# Patient Record
Sex: Female | Born: 1990 | Race: Black or African American | Hispanic: No | Marital: Single | Smoking: Never smoker
Health system: Southern US, Community
[De-identification: ages and names within clinical notes are randomized; demographics above are authoritative.]

## PROBLEM LIST (undated history)

## (undated) ENCOUNTER — Inpatient Hospital Stay (HOSPITAL_COMMUNITY): Payer: Self-pay

## (undated) ENCOUNTER — Ambulatory Visit: Payer: MEDICAID

## (undated) DIAGNOSIS — E785 Hyperlipidemia, unspecified: Secondary | ICD-10-CM

## (undated) DIAGNOSIS — J45909 Unspecified asthma, uncomplicated: Secondary | ICD-10-CM

## (undated) DIAGNOSIS — F209 Schizophrenia, unspecified: Secondary | ICD-10-CM

## (undated) DIAGNOSIS — Z8619 Personal history of other infectious and parasitic diseases: Secondary | ICD-10-CM

## (undated) DIAGNOSIS — D649 Anemia, unspecified: Secondary | ICD-10-CM

## (undated) DIAGNOSIS — D560 Alpha thalassemia: Secondary | ICD-10-CM

## (undated) DIAGNOSIS — Z8739 Personal history of other diseases of the musculoskeletal system and connective tissue: Secondary | ICD-10-CM

## (undated) DIAGNOSIS — O139 Gestational [pregnancy-induced] hypertension without significant proteinuria, unspecified trimester: Secondary | ICD-10-CM

## (undated) HISTORY — DX: Alpha thalassemia: D56.0

## (undated) HISTORY — PX: TONSILLECTOMY: SUR1361

## (undated) HISTORY — DX: Hyperlipidemia, unspecified: E78.5

## (undated) HISTORY — DX: Personal history of other infectious and parasitic diseases: Z86.19

## (undated) HISTORY — DX: Personal history of other diseases of the musculoskeletal system and connective tissue: Z87.39

## (undated) HISTORY — DX: Anemia, unspecified: D64.9

## (undated) HISTORY — DX: Gestational (pregnancy-induced) hypertension without significant proteinuria, unspecified trimester: O13.9

---
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
## 2015-08-14 ENCOUNTER — Emergency Department (HOSPITAL_COMMUNITY)
Admission: EM | Admit: 2015-08-14 | Discharge: 2015-08-14 | Disposition: A | Discharge status: Home / Self Care | Payer: BC Managed Care – PPO | Source: Home / Self Care | Attending: Emergency Medicine | Admitting: Emergency Medicine

## 2015-08-14 ENCOUNTER — Encounter (HOSPITAL_COMMUNITY): Payer: Self-pay | Admitting: Emergency Medicine

## 2015-08-14 DIAGNOSIS — R040 Epistaxis: Secondary | ICD-10-CM | POA: Diagnosis not present

## 2015-08-14 DIAGNOSIS — B9789 Other viral agents as the cause of diseases classified elsewhere: Principal | ICD-10-CM

## 2015-08-14 DIAGNOSIS — J069 Acute upper respiratory infection, unspecified: Secondary | ICD-10-CM

## 2015-08-14 HISTORY — DX: Unspecified asthma, uncomplicated: J45.909

## 2015-08-14 MED ORDER — ALBUTEROL SULFATE HFA 108 (90 BASE) MCG/ACT IN AERS: 2.0000 | INHALATION_SPRAY | RESPIRATORY_TRACT | Status: DC | PRN

## 2015-08-14 MED ORDER — HYDROCODONE-HOMATROPINE 5-1.5 MG/5ML PO SYRP: 5.0000 mL | ORAL_SOLUTION | Freq: Four times a day (QID) | ORAL | Status: DC | PRN

## 2015-08-14 NOTE — ED Provider Notes (Signed)
CSN: [id number]     Arrival date & time 08/14/15  1303 History   First MD Initiated Contact with Patient 08/14/15 1326     Chief Complaint  Patient presents with  . URI   (Consider location/radiation/quality/duration/timing/severity/associated sxs/prior Treatment) HPI  She is a 25 year old woman here for evaluation of cold symptoms. She states her symptoms started about 3 days ago. She reports nasal congestion, rhinorrhea, and cough. She reports some subjective fevers as well as a mild intermittent headache. She denies any shortness of breath. She does report some sternal chest pain, but only with coughing. She has had some wheezing, but this resolves with her albuterol inhaler. She denies any nausea or vomiting. She does report a decreased appetite. She has been taking over-the-counter medications with some improvement. She states it seems like her symptoms have started to improve. She denies any sore throat or body aches.  She also reports that over the last year she has had frequent nosebleeds.  She states they occur 3 times per week. They are always from the left nostril. She states they are light and stop bleeding quickly. She does not use any nasal sprays.  Past Medical History  Diagnosis Date  . Asthma    History reviewed. No pertinent past surgical history. History reviewed. No pertinent family history. Social History  Substance Use Topics  . Smoking status: Never Smoker   . Smokeless tobacco: None  . Alcohol Use: No   OB History    No data available     Review of Systems As in history of present illness Allergies  Review of patient's allergies indicates no known allergies.  Home Medications   Prior to Admission medications   Medication Sig Start Date End Date Taking? Authorizing Provider  albuterol (PROVENTIL HFA;VENTOLIN HFA) 108 (90 Base) MCG/ACT inhaler Inhale 2 puffs into the lungs every 4 (four) hours as needed for wheezing or shortness of breath. 08/14/15   Charm Rings, MD  HYDROcodone-homatropine [facility]) 5-1.5 MG/5ML syrup Take 5 mLs by mouth every 6 (six) hours as needed for cough. 08/14/15   Charm Rings, MD   Meds Ordered and Administered this Visit  Medications - No data to display  BP 115/66 mmHg  Pulse 67  Temp(Src) 98.3 F (36.8 C) (Oral)  Resp 16  SpO2 100%  LMP 07/26/2015 (Exact Date) No data found.   Physical Exam  Constitutional: She is oriented to person, place, and time. She appears well-developed and well-nourished. No distress.  HENT:  Mouth/Throat: No oropharyngeal exudate.  Mild oropharyngeal erythema with clear postnasal drainage. Nasal mucosa is erythematous and boggy. No scabbing or friability is seen.  Neck: Neck supple.  Cardiovascular: Normal rate, regular rhythm and normal heart sounds.   No murmur heard. Pulmonary/Chest: Effort normal and breath sounds normal. No respiratory distress. She has no wheezes. She has no rales. She exhibits no tenderness.  Lymphadenopathy:    She has no cervical adenopathy.  Neurological: She is alert and oriented to person, place, and time.    ED Course  Procedures (including critical care time)  Labs Review Labs Reviewed - No data to display  Imaging Review No results found.    MDM   1. Viral URI with cough   2. Bleeding nose    Discussed symptomatic treatment. Prescriptions given for albuterol and Hycodan. Discussed that I do not see anything concerning regarding the nosebleeds. RecommendedVaseline at bedtime. If this does not improve over the next month, she should see an ENT  specialist.    Charm Rings, MD 08/14/15 [phone]

## 2015-08-14 NOTE — Discharge Instructions (Signed)
You have a nasty cold virus. It sounds like you are on the down swing.  The cough can linger for several weeks. It is okay to continue over-the-counter medications. I've provided a prescription for Hycodan, a cough syrup. Fill this if the over-the-counter stuff is not working for you. Increase your fluid intake. I do not see anything obvious in your nose. I would try applying Vaseline at night. If this does not improve the nosebleeds over the next 2-4 weeks, please follow-up with an ENT specialist. Follow-up as needed.

## 2015-08-14 NOTE — ED Notes (Signed)
Pt has been suffering from nasal congestion and a cough for three days.  Pt has not taken her temperature at home, but feels she may have had a mild fever.

## 2017-10-18 ENCOUNTER — Other Ambulatory Visit: Payer: Self-pay

## 2017-10-18 ENCOUNTER — Ambulatory Visit (HOSPITAL_COMMUNITY)
Admission: RE | Admit: 2017-10-18 | Discharge: 2017-10-18 | Disposition: A | Discharge status: Home / Self Care | Payer: BC Managed Care – PPO | Attending: Psychiatry | Admitting: Psychiatry

## 2017-10-18 ENCOUNTER — Inpatient Hospital Stay (HOSPITAL_COMMUNITY)
Admission: AD | Admit: 2017-10-18 | Discharge: 2017-11-04 | DRG: 885 | Disposition: A | Discharge status: Home / Self Care | Payer: Self-pay | Source: Intra-hospital | Attending: Psychiatry | Admitting: Psychiatry

## 2017-10-18 ENCOUNTER — Encounter (HOSPITAL_COMMUNITY): Payer: Self-pay

## 2017-10-18 ENCOUNTER — Encounter (HOSPITAL_COMMUNITY): Payer: Self-pay | Admitting: Nurse Practitioner

## 2017-10-18 ENCOUNTER — Emergency Department (HOSPITAL_COMMUNITY)
Admission: EM | Admit: 2017-10-18 | Discharge: 2017-10-18 | Disposition: A | Discharge status: Other Acute Inpatient Hospital | Payer: Self-pay | Attending: Emergency Medicine | Admitting: Emergency Medicine

## 2017-10-18 DIAGNOSIS — R45851 Suicidal ideations: Secondary | ICD-10-CM | POA: Insufficient documentation

## 2017-10-18 DIAGNOSIS — R413 Other amnesia: Secondary | ICD-10-CM | POA: Insufficient documentation

## 2017-10-18 DIAGNOSIS — R451 Restlessness and agitation: Secondary | ICD-10-CM | POA: Diagnosis present

## 2017-10-18 DIAGNOSIS — F329 Major depressive disorder, single episode, unspecified: Secondary | ICD-10-CM | POA: Insufficient documentation

## 2017-10-18 DIAGNOSIS — J45909 Unspecified asthma, uncomplicated: Secondary | ICD-10-CM | POA: Insufficient documentation

## 2017-10-18 DIAGNOSIS — G4700 Insomnia, unspecified: Secondary | ICD-10-CM | POA: Insufficient documentation

## 2017-10-18 DIAGNOSIS — F209 Schizophrenia, unspecified: Principal | ICD-10-CM | POA: Diagnosis present

## 2017-10-18 DIAGNOSIS — R441 Visual hallucinations: Secondary | ICD-10-CM | POA: Insufficient documentation

## 2017-10-18 DIAGNOSIS — R410 Disorientation, unspecified: Secondary | ICD-10-CM | POA: Insufficient documentation

## 2017-10-18 LAB — COMPREHENSIVE METABOLIC PANEL
ALT: 22 U/L (ref 14–54)
AST: 22 U/L (ref 15–41)
Albumin: 4.1 g/dL (ref 3.5–5.0)
Alkaline Phosphatase: 52 U/L (ref 38–126)
Anion gap: 11 (ref 5–15)
BUN: 14 mg/dL (ref 6–20)
CO2: 20 mmol/L — ABNORMAL LOW (ref 22–32)
Calcium: 9.6 mg/dL (ref 8.9–10.3)
Chloride: 106 mmol/L (ref 101–111)
Creatinine, Ser: 1.01 mg/dL — ABNORMAL HIGH (ref 0.44–1.00)
GFR calc Af Amer: >60 mL/min (ref >60)
GFR calc non Af Amer: >60 mL/min (ref >60)
Glucose, Bld: 139 mg/dL — ABNORMAL HIGH (ref 65–99)
Potassium: 3.8 mmol/L (ref 3.5–5.1)
Sodium: 137 mmol/L (ref 135–145)
Total Bilirubin: 0.8 mg/dL (ref 0.3–1.2)
Total Protein: 7.8 g/dL (ref 6.5–8.1)

## 2017-10-18 LAB — I-STAT BETA HCG BLOOD, ED (MC, WL, AP ONLY): I-stat hCG, quantitative: <5 m[IU]/mL (ref <5)

## 2017-10-18 LAB — RAPID URINE DRUG SCREEN, HOSP PERFORMED
Amphetamines: NOT DETECTED
Barbiturates: NOT DETECTED
Benzodiazepines: NOT DETECTED
Cocaine: NOT DETECTED
Opiates: NOT DETECTED
Tetrahydrocannabinol: NOT DETECTED

## 2017-10-18 LAB — CBC WITH DIFFERENTIAL/PLATELET
Basophils Absolute: 0 10*3/uL (ref 0.0–0.1)
Basophils Relative: 0 %
Eosinophils Absolute: 0 10*3/uL (ref 0.0–0.7)
Eosinophils Relative: 0 %
HCT: 39.9 % (ref 36.0–46.0)
Hemoglobin: 12.9 g/dL (ref 12.0–15.0)
Lymphocytes Relative: 15 %
Lymphs Abs: 1.3 10*3/uL (ref 0.7–4.0)
MCH: 22.3 pg — ABNORMAL LOW (ref 26.0–34.0)
MCHC: 32.3 g/dL (ref 30.0–36.0)
MCV: 68.9 fL — ABNORMAL LOW (ref 78.0–100.0)
Monocytes Absolute: 0.6 10*3/uL (ref 0.1–1.0)
Monocytes Relative: 7 %
Neutro Abs: 6.9 10*3/uL (ref 1.7–7.7)
Neutrophils Relative %: 78 %
Platelets: 309 10*3/uL (ref 150–400)
RBC: 5.79 MIL/uL — ABNORMAL HIGH (ref 3.87–5.11)
RDW: 14.5 % (ref 11.5–15.5)
WBC: 8.8 10*3/uL (ref 4.0–10.5)

## 2017-10-18 LAB — ETHANOL: Alcohol, Ethyl (B): <10 mg/dL (ref <10)

## 2017-10-18 MED ORDER — LORAZEPAM 1 MG PO TABS
1.0000 mg | ORAL_TABLET | ORAL | Status: DC | PRN
  Filled 2017-10-18: qty 1

## 2017-10-18 MED ORDER — OLANZAPINE 5 MG PO TBDP
5.0000 mg | ORAL_TABLET | Freq: Three times a day (TID) | ORAL | Status: DC | PRN
  Filled 2017-10-18: qty 1

## 2017-10-18 MED ORDER — HYDROXYZINE HCL 25 MG PO TABS
25.0000 mg | ORAL_TABLET | Freq: Four times a day (QID) | ORAL | Status: DC | PRN
  Administered 2017-10-18: 25 mg via ORAL
  Filled 2017-10-18 (×2): qty 1

## 2017-10-18 MED ORDER — ZIPRASIDONE MESYLATE 20 MG IM SOLR
20.0000 mg | INTRAMUSCULAR | Status: DC | PRN
  Filled 2017-10-18: qty 20

## 2017-10-18 MED ORDER — MAGNESIUM HYDROXIDE 400 MG/5ML PO SUSP: 30.0000 mL | Freq: Every day | ORAL | Status: DC | PRN

## 2017-10-18 MED ORDER — ATENOLOL 25 MG PO TABS
25.0000 mg | ORAL_TABLET | Freq: Once | ORAL | Status: AC
  Administered 2017-10-18: 25 mg via ORAL
  Filled 2017-10-18 (×2): qty 1

## 2017-10-18 MED ORDER — TRAZODONE HCL 100 MG PO TABS: 100.0000 mg | ORAL_TABLET | Freq: Every evening | ORAL | Status: DC | PRN

## 2017-10-18 MED ORDER — ALUM & MAG HYDROXIDE-SIMETH 200-200-20 MG/5ML PO SUSP: 30.0000 mL | ORAL | Status: DC | PRN

## 2017-10-18 MED ORDER — ACETAMINOPHEN 325 MG PO TABS: 650.0000 mg | ORAL_TABLET | Freq: Four times a day (QID) | ORAL | Status: DC | PRN

## 2017-10-18 NOTE — ED Notes (Signed)
Pt awake, alert & responsive, no distress noted, calm & cooperative at present.  Family at bedside at present.  Monitoring for safety, Sitter at bedside.

## 2017-10-18 NOTE — ED Triage Notes (Signed)
Patient sent over from [facility] for medical clearence

## 2017-10-18 NOTE — ED Notes (Addendum)
Clothing with Diplomatic Services operational officer.

## 2017-10-18 NOTE — ED Notes (Signed)
Report called to RN Solomon, [facility], [address] hall. Pending [facility] transport.
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
## 2017-10-18 NOTE — ED Notes (Signed)
Pt presents stating that she has "cognitive problems, problems with her memory, and issues." She said that she was IVC'd by a friend, but the [facility], where pt initially presented, said that she was not under IVC. Pt is agreeable to be here. According to the [facility] at [facility], pt has been psychotic and confused for about 4 months. When in [place], she was so paranoid that she was sleeping in the car. Her thoughts are disorganized. Her mother said that she is not eating. At [facility] she was given a pudding, and she did not use a spoon to eat it. She tried to eat it with her face. She has SI with a plan for something painless. Denies HI. Denies AVH at this time, but did see shadows a few weeks ago.  [facility] recommended that she have medical clearance including CT scan of head. Pt dressed in purple scrubs and wanded by security. Her mother is with her.
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
## 2017-10-18 NOTE — ED Notes (Signed)
Leisure centre manager at bedside to give pt a pamphlet.  Mother remains at bedside.
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
## 2017-10-19 ENCOUNTER — Other Ambulatory Visit: Payer: Self-pay

## 2017-10-19 DIAGNOSIS — F23 Brief psychotic disorder: Secondary | ICD-10-CM

## 2017-10-19 MED ORDER — OLANZAPINE 10 MG PO TBDP: 10.0000 mg | ORAL_TABLET | Freq: Three times a day (TID) | ORAL | Status: DC | PRN

## 2017-10-19 MED ORDER — LORAZEPAM 1 MG PO TABS: 1.0000 mg | ORAL_TABLET | ORAL | Status: DC | PRN

## 2017-10-19 MED ORDER — ZIPRASIDONE MESYLATE 20 MG IM SOLR: 20.0000 mg | INTRAMUSCULAR | Status: DC | PRN

## 2017-10-19 MED ORDER — TRAZODONE HCL 100 MG PO TABS: 100.0000 mg | ORAL_TABLET | Freq: Every evening | ORAL | Status: DC | PRN

## 2017-10-19 MED ORDER — RISPERIDONE 1 MG PO TABS
1.0000 mg | ORAL_TABLET | Freq: Every day | ORAL | Status: DC
  Filled 2017-10-19 (×3): qty 1

## 2017-10-19 MED ORDER — TRAZODONE HCL 100 MG PO TABS
100.0000 mg | ORAL_TABLET | Freq: Every evening | ORAL | Status: DC | PRN
  Administered 2017-10-19: 100 mg via ORAL
  Filled 2017-10-19 (×6): qty 1
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
## 2017-10-19 NOTE — Progress Notes (Signed)
Recreation Therapy Notes  Date: 5.1.19 Time: 1000 Location: 500 Hall Dayroom  Group Topic: Anxiety  Goal Area(s) Addresses:  Patient will identify triggers for anxiety.  Patient will verbalize symptoms of anxiety. Patient will identify coping skill for anxiety post d/c.  Intervention: Worksheet, pencils  Activity: Anxiety.  Patients were given a worksheet to identify triggers, symptoms, thoughts and coping skills related to anxiety.  Education: Communication, Discharge Planning  Education Outcome: Acknowledges understanding/In group clarification offered/Needs additional education.   Clinical Observations/Feedback: Pt did not attend group.    Caroll Rancher, LRT/CTRS     Caroll Rancher A 10/19/2017 11:57 AM
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
## 2017-10-19 NOTE — Progress Notes (Signed)
Admission Note Pt is a 27 y/o female admitted onto the 500 I/P adult unit lacking basic insight for her admission; "I don't know why I'm here; all I needed was a few days' rest." Pt at the time of admission complained of severe anxiety about not knowing what is going on. Pt denied depression, pain, SI, HI and AVH; "all I need now is some rest and peace." "I don't know why people don't believe me but I'm ok." Support, encouragement, and safe environment provided.  15-minute safety checks initiated and continued. Pt remained alert, responsive and cooperative through the admission process. Pt searched and no contrabands found.
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
## 2017-10-19 NOTE — [facility] Group Notes (Signed)
LCSW Group Therapy Note  10/19/2017 1:15pm  Type of Therapy/Topic:  Group Therapy:  Balance in Life  Participation Level:  Did Not Attend  Description of Group:    This group will address the concept of balance and how it feels and looks when one is unbalanced. Patients will be encouraged to process areas in their lives that are out of balance and identify reasons for remaining unbalanced. Facilitators will guide patients in utilizing problem-solving interventions to address and correct the stressor making their life unbalanced. Understanding and applying boundaries will be explored and addressed for obtaining and maintaining a balanced life. Patients will be encouraged to explore ways to assertively make their unbalanced needs known to significant others in their lives, using other group members and facilitator for support and feedback.  Therapeutic Goals: 1. Patient will identify two or more emotions or situations they have that consume much of in their lives. 2. Patient will identify signs/triggers that life has become out of balance:  3. Patient will identify two ways to set boundaries in order to achieve balance in their lives:  4. Patient will demonstrate ability to communicate their needs through discussion and/or role plays  Summary of Patient Progress:      Therapeutic Modalities:   Cognitive Behavioral Therapy Solution-Focused Therapy Assertiveness Training  Ida Rogue, LCSW 10/19/2017 3:50 PM
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
## 2017-10-19 NOTE — [facility] Suicide Risk Assessment (Signed)
[facility] INPATIENT:  Family/Significant Other Suicide Prevention Education  Suicide Prevention Education:  Education Completed; No one has been identified by the patient as the family member/significant other with whom the patient will be residing, and identified as the person(s) who will aid the patient in the event of a mental health crisis (suicidal ideations/suicide attempt).  With written consent from the patient, the family member/significant other has been provided the following suicide prevention education, prior to the and/or following the discharge of the patient.  The suicide prevention education provided includes the following:  Suicide risk factors  Suicide prevention and interventions  National Suicide Hotline telephone number  [facility] Med assessment telephone number  [facility] Emergency Assistance 911  [facility] and/or Residential Mobile Crisis Unit telephone number  Request made of family/significant other to:  Remove weapons (e.g., guns, rifles, knives), all items previously/currently identified as safety concern.    Remove drugs/medications (over-the-counter, prescriptions, illicit drugs), all items previously/currently identified as a safety concern.  The family member/significant other verbalizes understanding of the suicide prevention education information provided.  The family member/significant other agrees to remove the items of safety concern listed above. The patient did not endorse SI at the time of admission, nor did the patient c/o SI during the stay here.  SPE not required.  Lindsay [name] 10/19/2017, 8:10 AM
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
## 2017-10-20 DIAGNOSIS — G4700 Insomnia, unspecified: Secondary | ICD-10-CM

## 2017-10-20 DIAGNOSIS — Z79899 Other long term (current) drug therapy: Secondary | ICD-10-CM

## 2017-10-20 DIAGNOSIS — F419 Anxiety disorder, unspecified: Secondary | ICD-10-CM

## 2017-10-20 DIAGNOSIS — R451 Restlessness and agitation: Secondary | ICD-10-CM

## 2017-10-20 DIAGNOSIS — F4310 Post-traumatic stress disorder, unspecified: Secondary | ICD-10-CM

## 2017-10-20 MED ORDER — SERTRALINE HCL 50 MG PO TABS
50.0000 mg | ORAL_TABLET | Freq: Every day | ORAL | Status: DC
  Administered 2017-10-20 – 2017-11-04 (×15): 50 mg via ORAL
  Filled 2017-10-20 (×12): qty 1
  Filled 2017-10-20: qty 7
  Filled 2017-10-20 (×4): qty 1

## 2017-10-20 MED ORDER — HYDROXYZINE HCL 50 MG PO TABS: 50.0000 mg | ORAL_TABLET | Freq: Four times a day (QID) | ORAL | Status: DC | PRN

## 2017-10-20 MED ORDER — ARIPIPRAZOLE 10 MG PO TABS
10.0000 mg | ORAL_TABLET | Freq: Every day | ORAL | Status: DC
  Administered 2017-10-20: 10 mg via ORAL
  Filled 2017-10-20 (×3): qty 1

## 2017-10-20 NOTE — [facility] Group Notes (Signed)
[facility] [employer] Group Therapy  10/20/2017 , 1:41 PM    Type of Therapy:  [employer] Presentation  Participation Level:  Active  Participation Quality:  Attentive  Affect:  Blunted  Cognitive:  Oriented  Insight:  Limited  Engagement in Therapy:  Engaged  Modes of Intervention:  Discussion, Education and Socialization  Summary of Progress/Problems:  Tammi  from [employer] came to present her recovery story, encourage group  members to share something about their story, and present information about the MHA.   Stayed the entire time, engaged throughout. "My recovery tools are prayer and singing." Ida Rogue 10/20/2017 , 1:41 PM
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
## 2017-10-20 NOTE — Progress Notes (Signed)
Patient has to be constantly redirected and educated on appropriate boundaries on the unit.  Patient was seen often attempting to hug peers and being intrusive.  Patient attempted to resist coming back to the unit from lunch and was placed on unit restriction.   Assess patient for safety, offer medications as prescribed, engage patent in 1:1 staff talk.   Patient needs much redirection.  Patient able to remain safe.

## 2017-10-20 NOTE — Progress Notes (Signed)
Recreation Therapy Notes  Date: 5.2.19 Time: 0950 Location: 500 Hall  Group Topic: Support Systems  Goal Area(s) Addresses:  Patient will be able to identify the benefits of healthy support systems. Patient will be able to identify the positive effects of support systems. Patient will be able to identify how support systems can be used post d/c.  Behavioral Response: Engaged  Intervention: Veterinary surgeon  Activity: Sharks in [employer].  Each patient was given a rubber disc and the group was one disc more than the number of people in the group.  Patients were to use the discs to maneuver the group from the nurses station to the end of hall and back.  Education: Communication, Discharge Planning  Education Outcome: Acknowledges understanding/In group clarification offered/Needs additional education.   Clinical Observations/Feedback: Pt was engaged, flat and seemed depressed.  Pt fully participated in the activity.     Caroll Rancher, LRT/CTRS     Caroll Rancher A 10/20/2017 11:24 AM

## 2017-10-20 NOTE — BHH Counselor (Addendum)
Adult Comprehensive Assessment  Patient ID: Lindsay [name], female   DOB: [birth year], 27 y.o.   MRN: [id number]  Information Source: Information source: Patient  Current Stressors:  Employment / Job issues: Unemployed Family Relationships: Good supports Surveyor, quantity / Lack of resources (include bankruptcy): No income-dependent on family Housing / Lack of housing: dependent on family  Living/Environment/Situation:  Living Arrangements: Parent Living conditions (as described by patient or guardian): states she lives with sister in [place], then states she may stay with aunt in [place], then states she stays in [place] How long has patient lived in current situation?: been in [place] since December What is atmosphere in current home: Comfortable, Supportive  Family History:  Marital Status: Single Are you sexually active?: No What is your sexual orientation?: heterosexual Does patient have children?: No  Childhood History:  Additional childhood history information: states she was raised by multiple family members Description of patient's relationship with caregiver when they were a child: good Patient's description of current relationship with people who raised him/her: good Does patient have siblings?: Yes Description of patient's current relationship with siblings: "I have alot" Did patient suffer any verbal/emotional/physical/sexual abuse as a child?: No Did patient suffer from severe childhood neglect?: No Has patient ever been sexually abused/assaulted/raped as an adolescent or adult?: No Was the patient ever a victim of a crime or a disaster?: No Witnessed domestic violence?: No Has patient been effected by domestic violence as an adult?: No  Education:  Highest grade of school patient has completed: 16-Social Work Currently a Consulting civil engineer?: No Learning disability?: No  Employment/Work Situation:   Employment situation: Biomedical scientist job has been impacted by current illness:  No What is the longest time patient has a held a job?: several years Where was the patient employed at that time?: customer service Has patient ever been in the [employer]?: No Are There Guns or Other Weapons in Your Home?: No  Financial Resources:   Surveyor, quantity resources: No income Does patient have a Lawyer or guardian?: No  Alcohol/Substance Abuse:   Alcohol/Substance Abuse Treatment Hx: Denies past history Has alcohol/substance abuse ever caused legal problems?: No  Social Support System:   Conservation officer, nature Support System: Good Describe Community Support System: Family Type of faith/religion: Ephriam Knuckles How does patient's faith help to cope with current illness?: Engineer, building services:   Leisure and Hobbies: Sing  Strengths/Needs:   What things does the patient do well?: Sing In what areas does patient struggle / problems for patient: Unable to name anything  Discharge Plan:   Does patient have access to transportation?: Yes Will patient be returning to same living situation after discharge?: Yes Currently receiving community mental health services: No If no, would patient like referral for services when discharged?: Yes (What county?)([place]) Does patient have financial barriers related to discharge medications?: Yes Patient description of barriers related to discharge medications: No income  Summary/Recommendations:   Summary and Recommendations (to be completed by the evaluator): Melda is a 27 YO AA female diagnosed with PTSD.  She presents voluntarily with paranoia and disorganization. [place] has no previous psychiatric history.  At d/c, she will return home with her mother and follow up at [facility].  While here, she can benefit from crises stabilization, medication management, therapeutic milieu and referral for services.  Ida Rogue. 10/20/2017

## 2017-10-20 NOTE — Progress Notes (Signed)
Recreation Therapy Notes  INPATIENT RECREATION THERAPY ASSESSMENT  Patient Details Name: Lindsay [name] MRN: [id number] DOB: 03-10-1991 Today's Date: 10/20/2017       Information Obtained From: Patient  Able to Participate in Assessment/Interview: Yes  Patient Presentation: Alert, Oriented  Reason for Admission (Per Patient): Other (Comments)(Depression)  Patient Stressors: Other (Comment)(Decision making)  Coping Skills:   Isolation, Self-Injury, Journal, Music, Exercise, Meditate, Deep Breathing, Talk, Prayer, Avoidance, Read, Dance  Leisure Interests (2+):  Individual - Other (Comment), Software engineer (Comment)(Prayer; helping others)  Frequency of Recreation/Participation: Other (Comment)(Daily)  Awareness of Community Resources:  No  Community Resources:     Current Use:    If no, Barriers?:    Expressed Interest in [employer] Information: Yes  County of Residence:  [place]  Patient Main Form of Transportation: Set designer  Patient Strengths:  Friends; God  Patient Identified Areas of Improvement:  Reading; Exercise  Patient Goal for Hospitalization:  "To learn"  Current SI (including self-harm):  No  Current HI:  No  Current AVH: No  Staff Intervention Plan: Group Attendance, Collaborate with Interdisciplinary Treatment Team  Consent to Intern Participation: N/A     Caroll Rancher, LRT/CTRS   Caroll Rancher A 10/20/2017, 11:55 AM

## 2017-10-20 NOTE — Plan of Care (Signed)
D: Pt avoided writer , pt refused her night medication stating she did not need it.  A: Pt was offered support and encouragement. Pt was given scheduled medications. Pt was encourage to attend groups. Q 15 minute checks were done for safety.  R:  safety maintained on unit.   Problem: Safety: Goal: Periods of time without injury will increase Outcome: Progressing

## 2017-10-21 DIAGNOSIS — F209 Schizophrenia, unspecified: Principal | ICD-10-CM

## 2017-10-21 MED ORDER — ARIPIPRAZOLE 15 MG PO TABS
15.0000 mg | ORAL_TABLET | Freq: Every day | ORAL | Status: DC
  Administered 2017-10-22 – 2017-10-23 (×2): 15 mg via ORAL
  Filled 2017-10-21 (×4): qty 1

## 2017-10-21 NOTE — Progress Notes (Signed)
Patient was calm, cooperative and participated in unit activities today.  Patient did not have any paranoid behaviors this shift and has been able to communicate her needs with staff this shift.  Though patient was hesitant about taking her medications this morning she was able to take them after talking to the physician.   Assess patient for safety, engage patient in 1:1 staff talks, offer medications as prescribed.    Patient able to contract for safety. Continue to monitor as planned.

## 2017-10-21 NOTE — Plan of Care (Signed)
  Problem: Safety: Goal: Periods of time without injury will increase 10/21/2017 0236 by Curly Rim, RN Outcome: Progressing 10/21/2017 0230 by Curly Rim, RN Outcome: Progressing   Problem: Role Relationship: Goal: Ability to interact with others will improve Outcome: Progressing   Problem: Safety: Goal: Ability to remain free from injury will improve Outcome: Progressing   Problem: Health Behavior/Discharge Planning: Goal: Ability to remain free from injury will improve Outcome: Progressing

## 2017-10-21 NOTE — Progress Notes (Signed)
Recreation Therapy Notes  Date: 5.3.19 Time: 1000 Location: 500 Hall Dayroom  Group Topic: Music Therapy  Goal Area(s) Addresses:  Patient will be able to identify the benefits of music. Patient will be able to identify emotions that come from music. Patient will identify how music can be used post d/c.   Behavioral Response: Engaged  Intervention: Music  Activity: Music Therapy.  Patients were to identify some of the feelings they have when listening to music.  Patients also shared with the group some of their favorite songs if appropriate.  Education: Communication, Discharge Planning  Education Outcome: Acknowledges understanding/In group clarification offered/Needs additional education.   Clinical Observations/Feedback:  Pt was quiet until prompted.  Pt stated one of her favorite songs is "I Learned To Respect the Power of Love" by Karsten Ro.  Pt became tearful while listening to the song.  Pt expressed that it reminded her of a time she was leaving her grandmother's house when she was a kid.  Pt sang along with a couple of other songs that were played as well.     Caroll Rancher, LRT/CTRS      Caroll Rancher A 10/21/2017 12:07 PM

## 2017-10-21 NOTE — Progress Notes (Signed)
Patient continues to refuse medications despite encouragement from staff to comply with medications.  Patient needs redirection about touching peers.

## 2017-10-21 NOTE — [facility] Group Notes (Signed)
LCSW Group Therapy Note  10/21/2017 1:15pm  Type of Therapy and Topic:  Group Therapy:  Feelings around Relapse and Recovery  Participation Level:  Active   Description of Group:    Patients in this group will discuss emotions they experience before and after a relapse. They will process how experiencing these feelings, or avoidance of experiencing them, relates to having a relapse. Facilitator will guide patients to explore emotions they have related to recovery. Patients will be encouraged to process which emotions are more powerful. They will be guided to discuss the emotional reaction significant others in their lives may have to their relapse or recovery. Patients will be assisted in exploring ways to respond to the emotions of others without this contributing to a relapse.  Therapeutic Goals: 1. Patient will identify two or more emotions that lead to a relapse for them 2. Patient will identify two emotions that result when they relapse 3. Patient will identify two emotions related to recovery 4. Patient will demonstrate ability to communicate their needs through discussion and/or role plays   Summary of Patient Progress:  Stayed the entire time, engaged throughout.  Identified trigger as "people who are not trustworthy, or don't have your best interests in mind."  Identified wellness plan as walking for exercise, eating healthy and getting good sleep. Cited various family members as people she could call on in crises.  Therapeutic Modalities:   Cognitive Behavioral Therapy Solution-Focused Therapy Assertiveness Training Relapse Prevention Therapy   Ida Rogue, [place] 10/21/2017 2:33 PM
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
## 2017-10-21 NOTE — Plan of Care (Signed)
D: Pt denies SI/HI/AV hallucinations. Pt is pleasant and cooperative. Pt observed interacting with peers, staff and her visitors.  A: Pt was offered support and encouragement.  Pt was encourage to attend groups. Q 15 minute checks were done for safety.  R: Pt. interacts well with peers and staff. Pt is taking medication. Pt has no complaints.Pt receptive to treatment and safety maintained on unit.

## 2017-10-22 DIAGNOSIS — Z91410 Personal history of adult physical and sexual abuse: Secondary | ICD-10-CM

## 2017-10-22 DIAGNOSIS — R450 Nervousness: Secondary | ICD-10-CM

## 2017-10-22 NOTE — [facility] Group Notes (Signed)
LCSW Therapy Group   10/22/2017 10:15AM               Type of Therapy and Topic:  Group Therapy: Anger Cues and Responses  Participation Level:  Active  In this group, patients learned how to recognize the physical, cognitive, emotional, and behavioral responses they have to anger-provoking situations.  They identified a recent time they became angry and how they reacted.  They analyzed how their reaction was possibly beneficial and how it was possibly unhelpful.  The group discussed anger warning signs and how to know when our anger can potentially become a problem.  Therapeutic Goals: 1. Patients will remember their last incident of anger and how they felt emotionally and physically, what their thoughts were at the time, and how they behaved. 2. Patients will identify how their behavior at that time worked for them, as well as how it worked against them. 3. Patients will explore how their body, mind and feelings play a role with anger. 4. Patients will learn that anger itself is normal and cannot be eliminated, and that healthier reactions can assist with resolving conflict rather than worsening situations. 5. Patients will complete a personal Anger Inventory worksheet to identify and scale their anger triggers, physical/emotional/behavioral reactions to anger and how their anger impacts them.  Summary of Patient Progress:   Pt continues to work towards their tx goals but has not yet reached them. Pt was able to appropriately participate in group discussion, and was able to offer support/validation to other group members. Pt reported the last time she felt angry was, "when I tried to call my friend and he didn't answer." Pt reported when she gets angry, "I feel like my skin gets really red." Pt reported one way she can cope with her anger in a positive way is to, "sing or listen to music."   Therapeutic Modalities:   Cognitive Behavioral Therapy  Heidi Dach, MSW, LCSW Clinical Social  Worker 10/22/2017 10:55 AM

## 2017-10-22 NOTE — Progress Notes (Signed)
Patient ID: Lindsay [name], female   DOB: 1990-08-03, 27 y.o.   MRN: [id number] DAR Note: Pt observed pacing the hallway with her visitor during visiting hours then stayed in her room for the rest of the evening. Pt continue to be paranoid and guarded. Pt however deny anxiety, depression, SI/HI, AVH or pain. All Pt's questions and concerns addressed. Support, encouragement, and safe environment provided.
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
## 2017-10-23 MED ORDER — ARIPIPRAZOLE 10 MG PO TABS
20.0000 mg | ORAL_TABLET | Freq: Every day | ORAL | Status: DC
  Administered 2017-10-24: 20 mg via ORAL
  Filled 2017-10-23 (×3): qty 2

## 2017-10-23 NOTE — [facility] Group Notes (Signed)
[facility] Group Notes:  (Nursing/MHT/Case Management/Adjunct)  Date:  10/23/2017  Time:  10:28 AM  Type of Therapy:  Psychoeducational Skills  Participation Level:  Minimal  Participation Quality:  Resistant  Affect:  Resistant  Cognitive:  Disorganized  Insight:  Lacking  Engagement in Group:  Lacking  Modes of Intervention:  Problem-solving  Summary of Progress/Problems: Pt attended Psychoeducational group with topic healthy support system.    Jacquelyne Balint Shanta 10/23/2017, 10:28 AM
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
## 2017-10-23 NOTE — [facility] Group Notes (Addendum)
[facility] LCSW Group Therapy Note  Date/Time:  10/23/2017 11AM-12PM  Type of Therapy and Topic:  Group Therapy:  Healthy and Unhealthy Supports  Participation Level:  Active   Description of Group:  Patients in this group were introduced to the idea of adding a variety of healthy supports to address the various needs in their lives.Patients discussed what additional healthy supports could be helpful in their recovery and wellness after discharge in order to prevent future hospitalizations.   An emphasis was placed on using counselor, doctor, therapy groups, 12-step groups, and problem-specific support groups to expand supports.  They also worked as a group on developing a specific plan for several patients to deal with unhealthy supports through boundary-setting, psychoeducation with loved ones, and even termination of relationships.   Therapeutic Goals:   1)  discuss importance of adding supports to stay well once out of the hospital  2)  compare healthy versus unhealthy supports and identify some examples of each  3)  generate ideas and descriptions of healthy supports that can be added  4)  offer mutual support about how to address unhealthy supports  5)  encourage active participation in and adherence to discharge plan    Summary of Patient Progress:  The patient stated that current healthy supports in her life are "mother and my family". Pt. Was resistant to identifying who her unhealthy supports are in the group. The patient expresses a need to "communicate with my support system when I need someone to talk to" once discharged to maintain her recovery.   Therapeutic Modalities:   Motivational Interviewing Brief Solution-Focused Therapy  Johny Shears, [address]

## 2017-10-23 NOTE — Progress Notes (Signed)
Adult Psychoeducational Group Note  Date:  10/23/2017 Time:  1:07 AM  Group Topic/Focus:  Wrap-Up Group:   The focus of this group is to help patients review their daily goal of treatment and discuss progress on daily workbooks.  Participation Level:  Active  Participation Quality:  Appropriate  Affect:  Appropriate  Cognitive:  Appropriate  Insight: Appropriate  Engagement in Group:  Engaged  Modes of Intervention:  Discussion  Additional Comments:  Pt stated her goal for today was to work on her nutrition and exercise.  Pt stated she accomplished her goal today and felt good about it. Pt stated she attend all groups held today.   Lindsay [name] 10/23/2017, 1:07 AM

## 2017-10-23 NOTE — Progress Notes (Signed)
Patient ID: Lindsay [name], female   DOB: October 15, 1990, 27 y.o.   MRN: [id number]  Nursing Progress Note [id number]  Data: Patient is observed pacing the halls. Patient states, "I am so tired, walking helps me not nap". Patient requests to speak to writer 1:1 and reports, "I am working on how to communicate and express my love language". Patient states, "I don't want to not participate but I don't like sharing personal things with everyone". Patient complaint with scheduled medications. Patient denies SI/HI/AVH or pain. Patient contracts for safety on the unit at this time. Patient completed self-inventory sheet and rates depression, hopelessness, and anxiety 0,0,0 respectively. Patient rates their sleep and appetite as poor/good respectively. Patient states goal for today is to "cooperate" and "work on myself physically, mentally and my social support system". Patient is seen attending groups and visible in the milieu.  Action: Patient educated about and provided medication per provider's orders. Patient safety maintained with q15 min safety checks. Low fall risk precautions in place. Emotional support given. 1:1 interaction and active listening provided. Patient encouraged to attend meals and groups. Labs, vital signs and patient behavior monitored throughout shift. Patient encouraged to work on treatment plan and goals.  Response: Patient remains safe on the unit at this time. Will continue to support and monitor.

## 2017-10-23 NOTE — [facility] Group Notes (Signed)
[facility] Group Notes:  (Nursing/MHT/Case Management/Adjunct)  Date:  10/23/2017  Time:  10:27 AM  Type of Therapy:  Patient self inventory group and goals group  Participation Level:  Active  Participation Quality:  Inattentive  Affect:  Anxious and Resistant  Cognitive:  Disorganized  Insight:  Lacking  Engagement in Group:  Lacking  Modes of Intervention:  Education  Summary of Progress/Problems: Pt did attend patient self inventory group and goals group.  Lindsay [name] 10/23/2017, 10:27 AM

## 2017-10-23 NOTE — Progress Notes (Signed)
Patient ID: Lindsay [name], female   DOB: 12-31-1990, 27 y.o.   MRN: [id number] DAR Note: Pt remained in room all evening. Pt at assessment denied anxiety, depression, pain or AVH; "I will stay in my room to be safe." All patient's questions and concerns addressed. Support, encouragement, and safe environment provided. 15-minute safety checks continue. Pt attended wrap-up group

## 2017-10-24 MED ORDER — ARIPIPRAZOLE 10 MG PO TABS
20.0000 mg | ORAL_TABLET | Freq: Every day | ORAL | Status: DC
  Filled 2017-10-24: qty 2

## 2017-10-24 MED ORDER — ARIPIPRAZOLE ER 400 MG IM SRER
400.0000 mg | INTRAMUSCULAR | Status: DC
Start: 2017-10-24 — End: 2017-11-04
  Administered 2017-10-24: 400 mg via INTRAMUSCULAR
  Filled 2017-10-24: qty 2

## 2017-10-24 MED ORDER — ARIPIPRAZOLE 10 MG PO TABS
20.0000 mg | ORAL_TABLET | Freq: Every day | ORAL | Status: DC
  Administered 2017-10-25 – 2017-10-27 (×3): 20 mg via ORAL
  Filled 2017-10-24 (×5): qty 2

## 2017-10-24 NOTE — Progress Notes (Signed)
Nursing Progress Note: 7p-7a D: Pt currently presents with a anxious/flat/worried/blocking/concrete thinking affect and behavior. Pt states "I am just teaching myself how to love my self." Interacting minimally with the milieu. Pt reports good sleep during the previous night with current medication regimen. Pt did attend wrap-up group.  A: Pt's labs and vitals were monitored throughout the night. Pt supported emotionally and encouraged to express concerns and questions. Pt educated on medications.  R: Pt's safety ensured with 15 minute and environmental checks. Pt currently denies SI, HI, and AVH. Pt verbally contracts to seek staff if SI,HI, or AVH occurs and to consult with staff before acting on any harmful thoughts. Will continue to monitor.

## 2017-10-24 NOTE — Progress Notes (Signed)
Adult Psychoeducational Group Note  Date:  10/24/2017 Time:  9:23 PM  Group Topic/Focus:  Wrap-Up Group:   The focus of this group is to help patients review their daily goal of treatment and discuss progress on daily workbooks.  Participation Level:  Active  Participation Quality:  Appropriate  Affect:  Appropriate  Cognitive:  Appropriate  Insight: Appropriate  Engagement in Group:  Engaged  Modes of Intervention:  Discussion  Additional Comments: The patient expressed that she rates today a 10.The patient  Also said that she attended group and reach her goal.  Octavio Manns 10/24/2017, 9:23 PM
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
## 2017-10-24 NOTE — [facility] Group Notes (Signed)
LCSW Group Therapy Note   10/24/2017 1:15pm   Type of Therapy and Topic:  Group Therapy:  Overcoming Obstacles   Participation Level:  Minimal   Description of Group:    In this group patients will be encouraged to explore what they see as obstacles to their own wellness and recovery. They will be guided to discuss their thoughts, feelings, and behaviors related to these obstacles. The group will process together ways to cope with barriers, with attention given to specific choices patients can make. Each patient will be challenged to identify changes they are motivated to make in order to overcome their obstacles. This group will be process-oriented, with patients participating in exploration of their own experiences as well as giving and receiving support and challenge from other group members.   Therapeutic Goals: 1. Patient will identify personal and current obstacles as they relate to admission. 2. Patient will identify barriers that currently interfere with their wellness or overcoming obstacles.  3. Patient will identify feelings, thought process and behaviors related to these barriers. 4. Patient will identify two changes they are willing to make to overcome these obstacles:      Summary of Patient Progress   Stayed the entire time, minimal interaction.  "My biggest obstacle is myself."  Unable to give more details. "You know the saying about you can't love others if you don't love yourself."  When asked about challenges with loving herself, she denied this is an issue. Vague, guarded.   Therapeutic Modalities:   Cognitive Behavioral Therapy Solution Focused Therapy Motivational Interviewing Relapse Prevention Therapy  Ida Rogue, LCSW 10/24/2017 4:32 PM

## 2017-10-24 NOTE — Progress Notes (Signed)
[facility] MD Progress Note  10/24/2017 4:28 PM Lindsay [name]  MRN:  [id number] Subjective:    Lindsay [name] is a 27 y/o F with no known psychiatric history who was admitted voluntarily as a walk-in to [facility] brought in by her mother with complaints of worsening depression, disorganization, paranoia, tangential speech, and thoughts of hurting herself. As per collateral from pt's mother, pt's symptoms significantly worsened in October 2018 when she was living with her sister, but they have had insidious onset for the past few years. Pt became paranoid and was staying outside of the apartment in her car.  She was medically cleared at [facility] and then returned to [facility] for additional treatment and stabilization.Pt was vague, paranoid, and minimally cooperative with initial interview. She refused to be started on medications. She later had episode of agitation on the unit requiring IM geodon to manage her behaviors. She was placed on IVC due to her agitation, disorganization, and poor insight. Pt was started on trial of zoloft and abilify, and dose of abilify has been titrated up during her stay.  Today upon evaluation, pt shares, "Everything is alright." She remains vague and guarded, but she is pleasant and generally cooperative with the interview. She denies any physical complaints. She is sleeping well. Her appetite is good. She denies SI/HI/AH/VH. Pt feels that her medications have been helpful, and she indicates that she felt abilify was more helpful at dosage of  once daily as compared to  po once daily. Pt was asked to be more specific about the differences she has noticed; however, she is vague and unable to articulate anything of substance. Her mother reports collateral information via telephone that pt has mentioned some daytime fatigue, and it was offered to pt to switch abilify to bedtime dosing, but she voices preference to keep abilify in the morning. Discussed with patient about transition to  long-acting injectable form of Abilify Maintena, and pt initially declines; however, after speaking with her mother, pt was in agreement to receive Abilify Maintena. She had no further questions, comments, or concerns.   Principal Problem: Schizophrenia (HCC) Diagnosis:   Patient Active Problem List   Diagnosis Date Noted  . Schizophrenia (HCC) [F20.9] 10/18/2017   Total Time spent with patient: 30 minutes  Past Psychiatric History: see H&P  Past Medical History:  Past Medical History:  Diagnosis Date  . Asthma    History reviewed. No pertinent surgical history. Family History: History reviewed. No pertinent family history. Family Psychiatric  History: see H&P Social History:  Social History   Substance and Sexual Activity  Alcohol Use No     Social History   Substance and Sexual Activity  Drug Use No    Social History   Socioeconomic History  . Marital status: Single    Spouse name: Not on file  . Number of children: Not on file  . Years of education: Not on file  . Highest education level: Not on file  Occupational History  . Not on file  Social Needs  . Financial resource strain: Not on file  . Food insecurity:    Worry: Not on file    Inability: Not on file  . Transportation needs:    Medical: Not on file    Non-medical: Not on file  Tobacco Use  . Smoking status: Never Smoker  . Smokeless tobacco: Never Used  Substance and Sexual Activity  . Alcohol use: No  . Drug use: No  . Sexual activity: Not on  file  Lifestyle  . Physical activity:    Days per week: Not on file    Minutes per session: Not on file  . Stress: Not on file  Relationships  . Social connections:    Talks on phone: Not on file    Gets together: Not on file    Attends religious service: Not on file    Active member of club or organization: Not on file    Attends meetings of clubs or organizations: Not on file    Relationship status: Not on file  Other Topics Concern  . Not on  file  Social History Narrative  . 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 acetaminophen (TYLENOL) tablet 650 mg  650 mg Oral Q6H PRN Kerry Hough, PA-C      . alum & mag hydroxide-simeth (MAALOX/MYLANTA) 200-200-20 MG/5ML suspension 30 mL  30 mL Oral Q4H PRN Kerry Hough, PA-C      . [START ON 10/25/2017] ARIPiprazole (ABILIFY) tablet 20 mg  20 mg Oral Daily Gusta Marksberry T, MD      . ARIPiprazole ER (ABILIFY MAINTENA) injection 400 mg  400 mg Intramuscular Q28 days Micheal Likens, MD      . hydrOXYzine (ATARAX/VISTARIL) tablet 50 mg  50 mg Oral Q6H PRN Micheal Likens, MD      . OLANZapine zydis (ZYPREXA) disintegrating tablet 10 mg  10 mg Oral Q8H PRN Micheal Likens, MD       And  . LORazepam (ATIVAN) tablet 1 mg  1 mg Oral PRN Micheal Likens, MD       And  . ziprasidone (GEODON) injection 20 mg  20 mg Intramuscular PRN Micheal Likens, MD      . magnesium hydroxide (MILK OF MAGNESIA) suspension 30 mL  30 mL Oral Daily PRN Donell Sievert E, PA-C      . sertraline (ZOLOFT) tablet 50 mg  50 mg Oral Daily Micheal Likens, MD   50 mg at 10/24/17 4403  . traZODone (DESYREL) tablet 100 mg  100 mg Oral QHS PRN,MR X 1 Thea Holshouser T, MD        Lab Results: No results found for this or any previous visit (from  the past 48 hour(s)).  Blood Alcohol level:  Lab Results  Component Value Date   ETH <10 10/18/2017    Metabolic Disorder Labs: No results found for: HGBA1C, MPG No results found for: PROLACTIN No results found for: CHOL, TRIG, HDL, CHOLHDL, VLDL, LDLCALC  Physical Findings: AIMS: Facial and Oral Movements Muscles of Facial Expression: None, normal Lips and Perioral Area: None, normal Jaw: None, normal Tongue: None, normal,Extremity Movements Upper (arms, wrists, hands, fingers): None, normal Lower (legs, knees, ankles, toes): None, normal, Trunk Movements Neck, shoulders, hips: None, normal, Overall Severity Severity of abnormal  movements (highest score from questions above): None, normal Incapacitation due to abnormal movements: None, normal Patient's awareness of abnormal movements (rate only patient's report): No Awareness, Dental Status Current problems with teeth and/or dentures?: No Does patient usually wear dentures?: No  CIWA:    COWS:     Musculoskeletal: Strength & Muscle Tone: within normal limits Gait & Station: normal Patient leans: N/A  Psychiatric Specialty Exam: Physical Exam  Nursing note and vitals reviewed.   Review of Systems  Constitutional: Negative for chills and fever.  Respiratory: Negative for cough and shortness of breath.   Cardiovascular: Negative for chest pain.  Gastrointestinal: Negative for abdominal pain, heartburn, nausea and vomiting.  Psychiatric/Behavioral: Negative for depression, hallucinations and suicidal ideas. The patient is not nervous/anxious and does not have insomnia.     Blood pressure 123/78, pulse 97, temperature 98.6 F (37 C), temperature source Oral, resp. rate 20, height  (1.6 m), weight 110.2 kg (243 lb), last menstrual period 10/14/2017.Body mass index is 43.05 kg/m.  General Appearance: Casual and Fairly Groomed  Eye Contact:  Good  Speech:  Clear and Coherent and Normal Rate  Volume:  Normal  Mood:   Euthymic  Affect:  Congruent, Constricted and Flat  Thought Process:  Coherent and Goal Directed  Orientation:  Full (Time, Place, and Person)  Thought Content:  Logical and Abstract Reasoning  Suicidal Thoughts:  No  Homicidal Thoughts:  No  Memory:  Immediate;   Fair Recent;   Fair Remote;   Fair  Judgement:  Fair  Insight:  Lacking  Psychomotor Activity:  Normal  Concentration:  Concentration: Fair  Recall:  [employer] of Knowledge:  Fair  Language:  Fair  Akathisia:  No  Handed:    AIMS (if indicated):     Assets:  Communication Skills  ADL's:  Intact  Cognition:  WNL  Sleep:  Number of Hours: 6.25   Treatment Plan Summary: Daily contact with patient to assess and evaluate symptoms and progress in treatment and Medication management   -Continue inpatient hospitalization  -Schizophrenia -Continue zoloft  po qDay -Continue Abilify  po qDay   -Start abilify Maintena  IM q28 days (administer today 10/24/17)  -Anxiety -Continue atarax  po q6h prn anxiety  -Insomnia -Continue trazodone  po qhs prn insomnia(may repeat x1)  -Agitation -Continue zydis  po q8h prn agitation -Continue ativan  po once prn agiation -Continue geodon  IM once prn agitation  -Encourage participation in groups and therapeutic milieu  -Disposition planning will be ongoing   Micheal Likens, MD 10/24/2017, 4:28 PM

## 2017-10-24 NOTE — Progress Notes (Signed)
Patient ID: Lindsay [name], female   DOB: 1991-05-18, 27 y.o.   MRN: [id number] DAR Note: Pt remained isolated to room all evening. Pt at assessment denied anxiety, depression, pain or AVH; "I feel safe here (in her room)." All patient's questions and concerns addressed. Support, encouragement, and safe environment provided. 15-minute safety checks continue. Pt did not attend wrap-up group.

## 2017-10-24 NOTE — Progress Notes (Signed)
Recreation Therapy Notes  Date: 5.6.19 Time: 1000 Location: [address] Dayroom  Group Topic: Anger Management  Goal Area(s) Addresses:  Patient will identify triggers for anger.  Patient will identify physical reaction to anger.   Patient will identify benefit of using coping skills when angry.  Behavioral Response: Engaged  Intervention: Worksheet  Activity: Intro to Anger Management.  Patients were given a worksheet in which they were to identify what causes their anger, their reaction to anger and any problems they have encountered because of anger.  Education: Anger Management, Discharge Planning   Education Outcome: Acknowledges education/In group clarification offered/Needs additional education.   Clinical Observations/Feedback: Pt expressed anger is a problem "when it constantly happens".  Pt stated she deals with anger by communicating.  Pt explained "being used as a means" leads to anger.  Pt stated she sings when angry and problems that can occur from anger are "stunting personal growth" and damage "connections with friends".     Caroll Rancher, LRT/CTRS      Caroll Rancher A 10/24/2017 11:32 AM

## 2017-10-24 NOTE — Plan of Care (Signed)
  Problem: Self-Concept: Goal: Level of anxiety will decrease Outcome: Progressing   Problem: Health Behavior/Discharge Planning: Goal: Compliance with prescribed medication regimen will improve Outcome: Progressing  DAR NOTE: Patient presents with anxious affect and mood.  Denies suicidal thoughts, auditory and visual hallucinations.  Rates depression at 0, hopelessness at 0, and anxiety at 0.  Maintained on routine safety checks.  Medications given as prescribed.  Support and encouragement offered as needed.  Attended group and participated.  States goal for today is "physicality, cognitive and social."  Patient observed socializing with peers in the dayroom.  Abilify maintenance 400 mg given IM.  No adverse effect noted.   Offered no complaint.

## 2017-10-25 NOTE — Progress Notes (Signed)
Recreation Therapy Notes  Date: 5.7.19 Time: 0945 Location: [address] Dayroom  Group Topic: Wellness  Goal Area(s) Addresses:  Patient will define components of whole wellness. Patient will verbalize benefit of whole wellness.  Behavioral Response: Engaged  Intervention: Music  Activity: Exercise.  LRT led group in four rounds of exercises.  LRT let each patient pick an exercise to lead the group in.  Education:Wellness, Discharge Planning.   Education Outcome: Acknowledges education/In group clarification offered/Needs additional education.   Clinical Observations/Feedback:  Pt was bright and fully active in group.  Pt was smiling and able to come up with some creative exercises for the group to do.  Pt was able to focus on group.   Lindsay [name], LRT/CTRS          Lindsay [name], Lindsay [name] A 10/25/2017 11:02 AM

## 2017-10-25 NOTE — Plan of Care (Signed)
  Problem: Self-Concept: Goal: Level of anxiety will decrease Outcome: Progressing   Problem: Safety: Goal: Periods of time without injury will increase Outcome: Progressing DAR NOTE: Patient appears less anxious with pleasant mood.  Denies suicidal thoughts, pain, auditory and visual hallucinations.  Described energy level as normal and concentration as good.  Observed pacing the unit most of this shift.  Rates depression at 0, hopelessness at 0, and anxiety at 0.  Maintained on routine safety checks.  Medications given as prescribed.  Support and encouragement offered as needed.  Attended group and participated.  States goal for today is "my relationship with God."  Patient observed socializing with peers in the dayroom.  Offered no complaint.

## 2017-10-25 NOTE — Progress Notes (Signed)
Nursing Progress Note: 7p-7a D: Pt currently presents with a anxious/depressed/thought blocking/concrete thinking/paranoid/guarded affect and behavior. Pt states "I attended all the groups. I am still learning to love myself." Interacting appropriately with the milieu. Pt reports good sleep during the previous night with current medication regimen. Pt did attend wrap-up group.  A: Pt's labs and vitals were monitored throughout the night. Pt supported emotionally and encouraged to express concerns and questions. Pt educated on medications.  R: Pt's safety ensured with 15 minute and environmental checks. Pt currently denies SI, HI, and AVH. Pt verbally contracts to seek staff if SI,HI, or AVH occurs and to consult with staff before acting on any harmful thoughts. Will continue to monitor.

## 2017-10-25 NOTE — Progress Notes (Signed)
[facility] MD Progress Note  10/25/2017 11:50 AM Lindsay [name]  MRN:  [id number] Subjective:    Lindsay [name] is a 27 y/o F with no known psychiatric history who was admitted voluntarily as a walk-in to [facility] brought in by her mother with complaints of worsening depression, disorganization, paranoia, tangential speech, and thoughts of hurting herself. As per collateral from pt's mother, pt's symptoms significantly worsened in October 2018 when she was living with her sister, but they have had insidious onset for the past few years. Pt became paranoid and was staying outside of the apartment in her car.  She was medically cleared at [facility] and then returned to [facility] for additional treatment and stabilization.Pt was vague, paranoid, and minimally cooperative with initial interview. She refused to be started on medications. She later had episode of agitation on the unit requiring IM geodon to manage her behaviors. She was placed on IVC due to her agitation, disorganization, and poor insight.Pt was started on trial of zoloft and abilify, and dose of abilify was titrated up during her stay and she was also transitioned to long-acting injectable form of Abilify Maintena. Pt has been reporting incremental improvement of her presenting symptoms.  Today upon evaluation, pt is somewhat guarded but she is generally cooperative with the interview. Pt shares, "I'm good." Pt was asked if she had any problems or concerns, and she replies, "I'd rather not say, I just like to keep moving." She denies any specific concerns. She is sleeping well. Her appetite is good. She denies SI/HI/AH/VH. She reports that she is tolerating her medications well. She denies any side effects including akithisia. She denies daytime sedation. She feels that her medications have been helpful but she is unable to provide any specific examples of what has improved. Discussed with patient that we have been in contact with her mother whom has raised  concerns about pt being on her own throughout the daytime while her mother is at work, and pt is open to referral to day programming in the area. She shares that her ultimate goal is get a job and move back to live independently. Pt is in agreement to continue her current treatment regimen without changes. She had no further questions, comments, or concerns.  Principal Problem: Schizophrenia (HCC) Diagnosis:   Patient Active Problem List   Diagnosis Date Noted  . Schizophrenia (HCC) [F20.9] 10/18/2017   Total Time spent with patient: 30 minutes  Past Psychiatric History: see H&P  Past Medical History:  Past Medical History:  Diagnosis Date  . Asthma    History reviewed. No pertinent surgical history. Family History: History reviewed. No pertinent family history. Family Psychiatric  History: see H&P Social History:  Social History   Substance and Sexual Activity  Alcohol Use No     Social History   Substance and Sexual Activity  Drug Use No    Social History   Socioeconomic History  . Marital status: Single    Spouse name: Not on file  . Number of children: Not on file  . Years of education: Not on file  . Highest education level: Not on file  Occupational History  . Not on file  Social Needs  . Financial resource strain: Not on file  . Food insecurity:    Worry: Not on file    Inability: Not on file  . Transportation needs:    Medical: Not on file    Non-medical: Not on file  Tobacco Use  . Smoking status:  Never Smoker  . Smokeless tobacco: Never Used  Substance and Sexual Activity  . Alcohol use: No  . Drug use: No  . Sexual activity: Not on file  Lifestyle  . Physical activity:    Days per week: Not on file    Minutes per session: Not on file  . Stress: Not on file  Relationships  . Social connections:    Talks on phone: Not on file    Gets together: Not on file    Attends religious service: Not on file    Active member of club or organization: Not  on file    Attends meetings of clubs or organizations: Not on file    Relationship status: Not on file  Other Topics Concern  . Not on file  Social History Narrative  . 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 acetaminophen (TYLENOL) tablet 650 mg  650 mg Oral Q6H PRN Kerry Hough, PA-C      . alum & mag hydroxide-simeth (MAALOX/MYLANTA) 200-200-20 MG/5ML suspension 30 mL  30 mL Oral Q4H PRN Donell Sievert E, PA-C      . ARIPiprazole (ABILIFY) tablet 20 mg  20 mg Oral Daily Micheal Likens, MD   20 mg at 10/25/17 0810  . ARIPiprazole ER (ABILIFY MAINTENA) injection 400 mg  400 mg Intramuscular Q28 days Micheal Likens, MD   400 mg at 10/24/17 1700  . hydrOXYzine (ATARAX/VISTARIL) tablet 50 mg  50 mg Oral Q6H PRN Micheal Likens, MD      . OLANZapine zydis (ZYPREXA) disintegrating tablet 10 mg  10 mg Oral Q8H PRN Micheal Likens, MD       And  . LORazepam (ATIVAN) tablet 1 mg  1 mg Oral PRN Micheal Likens, MD       And  . ziprasidone (GEODON) injection 20 mg  20 mg Intramuscular PRN Micheal Likens, MD      . magnesium hydroxide (MILK OF MAGNESIA) suspension 30 mL  30 mL Oral Daily PRN Donell Sievert E, PA-C      . sertraline (ZOLOFT) tablet 50 mg  50 mg Oral Daily Micheal Likens, MD   50 mg at 10/25/17 0810  .  traZODone (DESYREL) tablet 100 mg  100 mg Oral QHS PRN,MR X 1 Andreanna Mikolajczak T, MD        Lab Results: No results found for this or any previous visit (from the past 48 hour(s)).  Blood Alcohol level:  Lab Results  Component Value Date   ETH <10 10/18/2017    Metabolic Disorder Labs: No results found for: HGBA1C, MPG No results found for: PROLACTIN No results found for: CHOL, TRIG, HDL, CHOLHDL, VLDL, LDLCALC  Physical Findings: AIMS: Facial and Oral Movements Muscles of Facial Expression: None, normal Lips and Perioral Area: None, normal Jaw:  None, normal Tongue: None, normal,Extremity Movements Upper (arms, wrists, hands, fingers): None, normal Lower (legs, knees, ankles, toes): None, normal, Trunk Movements Neck, shoulders, hips: None, normal, Overall Severity Severity of abnormal movements (highest score from questions above): None, normal Incapacitation due to abnormal movements: None, normal Patient's awareness of abnormal movements (rate only patient's report): No Awareness, Dental Status Current problems with teeth and/or dentures?: No Does patient usually wear dentures?: No  CIWA:    COWS:     Musculoskeletal: Strength & Muscle Tone: within normal limits Gait & Station: normal Patient leans: N/A  Psychiatric Specialty Exam: Physical Exam  Nursing note and vitals reviewed.   Review of Systems  Constitutional: Negative for chills and fever.  Respiratory: Negative for cough and shortness of breath.   Cardiovascular: Negative for chest pain.  Gastrointestinal: Negative for abdominal pain, heartburn, nausea and vomiting.  Psychiatric/Behavioral: Negative for depression, hallucinations and suicidal ideas. The patient is not nervous/anxious and does not have insomnia.     Blood pressure (!) 149/95, pulse 99, temperature 98.5 F (36.9 C), temperature source Oral, resp. rate 20, height  (1.6 m), weight 110.2 kg (243 lb), last menstrual period  10/14/2017.Body mass index is 43.05 kg/m.  General Appearance: Casual and Fairly Groomed  Eye Contact:  Good  Speech:  Clear and Coherent and Normal Rate  Volume:  Normal  Mood:  Euthymic  Affect:  Congruent, Constricted and Flat  Thought Process:  Coherent and Goal Directed  Orientation:  Full (Time, Place, and Person)  Thought Content:  Logical  Suicidal Thoughts:  No  Homicidal Thoughts:  No  Memory:  Immediate;   Fair Recent;   Fair Remote;   Fair  Judgement:  Fair  Insight:  Lacking  Psychomotor Activity:  Normal  Concentration:  Concentration: Fair and Attention Span: Fair  Recall:  [employer] of Knowledge:  Fair  Language:  Fair  Akathisia:  No  Handed:    AIMS (if indicated):     Assets:  Resilience Social Support  ADL's:  Intact  Cognition:  WNL  Sleep:  Number of Hours: 6.75   Treatment Plan Summary: Daily contact with patient to assess and evaluate symptoms and progress in treatment and Medication management   -Continue inpatient hospitalization  -Schizophrenia -Continuezoloft  po qDay -Continue Abilify  po qDay             -Continue abilify Maintena  IM q28 days (administered on 10/24/17)  -Anxiety -Continue atarax  po q6h prn anxiety  -Insomnia -Continue trazodone  po qhs prn insomnia(may repeat x1)  -Agitation -Continue zydis  po q8h prn agitation -Continue ativan  po once prn agiation -Continue geodon  IM once prn agitation  -Encourage participation in groups and therapeutic milieu  -Disposition planning will be ongoing  Micheal Likens, MD 10/25/2017, 11:50 AM

## 2017-10-25 NOTE — [facility] Group Notes (Signed)
LCSW Group Therapy Note   10/25/2017 1:15pm   Type of Therapy and Topic:  Group Therapy:  Overcoming Obstacles   Participation Level:  Minimal   Description of Group:    In this group patients will be encouraged to explore what they see as obstacles to their own wellness and recovery. They will be guided to discuss their thoughts, feelings, and behaviors related to these obstacles. The group will process together ways to cope with barriers, with attention given to specific choices patients can make. Each patient will be challenged to identify changes they are motivated to make in order to overcome their obstacles. This group will be process-oriented, with patients participating in exploration of their own experiences as well as giving and receiving support and challenge from other group members.   Therapeutic Goals: 1. Patient will identify personal and current obstacles as they relate to admission. 2. Patient will identify barriers that currently interfere with their wellness or overcoming obstacles.  3. Patient will identify feelings, thought process and behaviors related to these barriers. 4. Patient will identify two changes they are willing to make to overcome these obstacles:      Summary of Patient Progress  Stayed the entire time.  Continues with vague answers/contriburtions. "It's important to have good boundaries. We need to work together to make each other stronger, and not tear each other down."    Therapeutic Modalities:   Cognitive Behavioral Therapy Solution Focused Therapy Motivational Interviewing Relapse Prevention Therapy  Ida Rogue, LCSW 10/25/2017 2:57 PM

## 2017-10-26 DIAGNOSIS — F200 Paranoid schizophrenia: Secondary | ICD-10-CM

## 2017-10-26 NOTE — Progress Notes (Signed)
Recreation Therapy Notes  Date: 5.8.19 Time: 1000 Location: 500 Hall Dayroom  Group Topic: Self-Esteem  Goal Area(s) Addresses:  Patient will successfully identify positive attributes about themselves.  Patient will successfully identify benefit of improved self-esteem.   Behavioral Response: Engaged  Intervention: Markers, blank crest  Activity: Crest of [place].  Patients were to identify things that mean something to them.  Patients could highlight their biggest accomplishments, proudest moment, best feature, something they are good at or something they value.  Education:  Self-Esteem, Building control surveyor.   Education Outcome: Acknowledges education/In group clarification offered/Needs additional education  Clinical Observations/Feedback: Pt stated "I love all my features the same"; "learning to love myself" is her proudest moment; singing to family is her favorite activity; pt stated she was good at singing and her biggest accomplishment was "taking another step to where I belong".  Pt was focused and quiet but engaged when prompted.    Caroll Rancher, LRT/CTRS      Caroll Rancher A 10/26/2017 11:16 AM

## 2017-10-26 NOTE — Progress Notes (Signed)
The patient described her day as having been "productive" and that she worked on her walking. Her goal for tomorrow is to get discharged.

## 2017-10-26 NOTE — Progress Notes (Signed)
[facility] MD Progress Note  10/26/2017 2:32 PM Lindsay [name]  MRN:  [id number]  Subjective: Lindsay [name] reports, "I'm doing good, learning a lot on; how to listen better, follow directions. I'm learning how to depend on the help that is available to be. I'm learning to love myself, who I'm & what my identity is. My mood is alright".    Lindsay [name] is a 27 y/o F with no known psychiatric history who was admitted voluntarily as a walk-in to [facility] brought in by her mother with complaints of worsening depression, disorganization, paranoia, tangential speech, and thoughts of hurting herself. As per collateral from pt's mother, pt's symptoms significantly worsened in October 2018 when she was living with her sister, but they have had insidious onset for the past few years. Pt became paranoid and was staying outside of the apartment in her car.  She was medically cleared at [facility] and then returned to [facility] for additional treatment and stabilization.Pt was vague, paranoid, and minimally cooperative with initial interview. She refused to be started on medications. She later had episode of agitation on the unit requiring IM geodon to manage her behaviors. She was placed on IVC due to her agitation, disorganization, and poor insight.Pt was started on trial of zoloft and abilify, and dose of abilify was titrated up during her stay and she was also transitioned to long-acting injectable form of Abilify Maintena. Pt has been reporting incremental improvement of her presenting symptoms.  Today 10-26-17, Lindsay [name] is seen, chart reviewed. The chart findings discussed with the treatment team. She is visible on the unit, attending group sessions. She reports she is learning a lot being her here; how to listen, follow directions, how to depend on the available help, how to love herself & what her identity is. She says her mood is good. She is taking her medications, although, not big on medications she says. She says she will cooperate  well when she gets home after discharge. Her appetite is good. She denies SI/HI/AH/VH. She reports that she is tolerating her medications well. She denies any side effects including akithisia. She denies daytime sedation. The attending psychiatrist had earlier discussed with patient that the treatment team has been in contact with her mother whom has raised concerns about pt being on her own throughout the daytime while her mother is at work, and pt is open to referral to day programming in the area. She shares that her ultimate goal is get a job and move back to live independently. Pt is in agreement to continue her current treatment regimen without changes. She had no further questions, comments, or concerns.  Principal Problem: Schizophrenia (HCC) Diagnosis:   Patient Active Problem List   Diagnosis Date Noted  . Schizophrenia (HCC) [F20.9] 10/18/2017   Total Time spent with patient: 15 minutes  Past Psychiatric History: See H&P  Past Medical History:  Past Medical History:  Diagnosis Date  . Asthma    History reviewed. No pertinent surgical history. Family History: History reviewed. No pertinent family history.  Family Psychiatric  History: See H&P  Social History:  Social History   Substance and Sexual Activity  Alcohol Use No     Social History   Substance and Sexual Activity  Drug Use No    Social History   Socioeconomic History  . Marital status: Single    Spouse name: Not on file  . Number of children: Not on file  . Years of education: Not on file  . Highest  education level: Not on file  Occupational History  . Not on file  Social Needs  . Financial resource strain: Not on file  . Food insecurity:    Worry: Not on file    Inability: Not on file  . Transportation needs:    Medical: Not on file    Non-medical: Not on file  Tobacco Use  . Smoking status: Never Smoker  . Smokeless tobacco: Never Used  Substance and Sexual Activity  . Alcohol use: No  .  Drug use: No  . Sexual activity: Not on file  Lifestyle  . Physical activity:    Days per week: Not on file    Minutes per session: Not on file  . Stress: Not on file  Relationships  . Social connections:    Talks on phone: Not on file    Gets together: Not on file    Attends religious service: Not on file    Active member of club or organization: Not on file    Attends meetings of clubs or organizations: Not on file    Relationship status: Not on file  Other Topics Concern  . Not on file  Social History Narrative  . Not on file   Additional Social History:  Pain Medications: see MAR Prescriptions: see MAR Over the Counter: see MAR History of alcohol / drug use?: Yes Longest period of sobriety (when/how long): 4 months Negative Consequences of Use: Work / Programmer, multimedia, Copywriter, advertising relationships, Financial Name of Substance 1: Marijuana 1 - Age of First Use: unknown 1 - Amount (size/oz): unknown 1 - Frequency: unknown 1 - Duration: unknown 1 - Last Use / Amount: January 2019  Sleep: Good  Appetite:  Good  Current Medications: Current Facility-Administered Medications  Medication Dose Route Frequency Provider Last Rate Last Dose  . acetaminophen (TYLENOL) tablet 650 mg  650 mg Oral Q6H PRN Kerry Hough, PA-C      . alum & mag hydroxide-simeth (MAALOX/MYLANTA) 200-200-20 MG/5ML suspension 30 mL  30 mL Oral Q4H PRN Donell Sievert E, PA-C      . ARIPiprazole (ABILIFY) tablet 20 mg  20 mg Oral Daily Micheal Likens, MD   20 mg at 10/26/17 0809  . ARIPiprazole ER (ABILIFY MAINTENA) injection 400 mg  400 mg Intramuscular Q28 days Micheal Likens, MD   400 mg at 10/24/17 1700  . hydrOXYzine (ATARAX/VISTARIL) tablet 50 mg  50 mg Oral Q6H PRN Micheal Likens, MD      . OLANZapine zydis (ZYPREXA) disintegrating tablet 10 mg  10 mg Oral Q8H PRN Micheal Likens, MD       And  . LORazepam (ATIVAN) tablet 1 mg  1 mg Oral PRN Micheal Likens, MD        And  . ziprasidone (GEODON) injection 20 mg  20 mg Intramuscular PRN Micheal Likens, MD      . magnesium hydroxide (MILK OF MAGNESIA) suspension 30 mL  30 mL Oral Daily PRN Donell Sievert E, PA-C      . sertraline (ZOLOFT) tablet 50 mg  50 mg Oral Daily Micheal Likens, MD   50 mg at 10/26/17 1610  . traZODone (DESYREL) tablet 100 mg  100 mg Oral QHS PRN,MR X 1 [place], Christopher T, MD        Lab Results: No results found for this or any previous visit (from the past 48 hour(s)).  Blood Alcohol level:  Lab Results  Component Value Date   ETH <10  10/18/2017   Metabolic Disorder Labs: No results found for: HGBA1C, MPG No results found for: PROLACTIN No results found for: CHOL, TRIG, HDL, CHOLHDL, VLDL, LDLCALC  Physical Findings: AIMS: Facial and Oral Movements Muscles of Facial Expression: None, normal Lips and Perioral Area: None, normal Jaw: None, normal Tongue: None, normal,Extremity Movements Upper (arms, wrists, hands, fingers): None, normal Lower (legs, knees, ankles, toes): None, normal, Trunk Movements Neck, shoulders, hips: None, normal, Overall Severity Severity of abnormal movements (highest score from questions above): None, normal Incapacitation due to abnormal movements: None, normal Patient's awareness of abnormal movements (rate only patient's report): No Awareness, Dental Status Current problems with teeth and/or dentures?: No Does patient usually wear dentures?: No  CIWA:    COWS:     Musculoskeletal: Strength & Muscle Tone: within normal limits Gait & Station: normal Patient leans: N/A  Psychiatric Specialty Exam: Physical Exam  Nursing note and vitals reviewed.   Review of Systems  Constitutional: Negative for chills and fever.  Respiratory: Negative for cough and shortness of breath.   Cardiovascular: Negative for chest pain.  Gastrointestinal: Negative for abdominal pain, heartburn, nausea and vomiting.   Psychiatric/Behavioral: Negative for depression, hallucinations and suicidal ideas. The patient is not nervous/anxious and does not have insomnia.     Blood pressure 135/75, pulse 85, temperature 98.4 F (36.9 C), temperature source Oral, resp. rate 18, height  (1.6 m), weight 110.2 kg (243 lb), last menstrual period 10/14/2017.Body mass index is 43.05 kg/m.  General Appearance: Casual and Fairly Groomed  Eye Contact:  Good  Speech:  Clear and Coherent and Normal Rate  Volume:  Normal  Mood:  Euthymic  Affect:  Congruent, Constricted and Flat  Thought Process:  Coherent and Goal Directed  Orientation:  Full (Time, Place, and Person)  Thought Content:  Logical  Suicidal Thoughts:  No  Homicidal Thoughts:  No  Memory:  Immediate;   Fair Recent;   Fair Remote;   Fair  Judgement:  Fair  Insight:  Lacking  Psychomotor Activity:  Normal  Concentration:  Concentration: Fair and Attention Span: Fair  Recall:  [employer] of Knowledge:  Fair  Language:  Fair  Akathisia:  No  Handed:    AIMS (if indicated):     Assets:  Resilience Social Support  ADL's:  Intact  Cognition:  WNL  Sleep:  Number of Hours: 6.75   Treatment Plan Summary: Daily contact with patient to assess and evaluate symptoms and progress in treatment and Medication management   -Continue inpatient hospitalization.  -Will continue today 10/26/2017 plan as below except where it is noted.  -Schizophrenia -Continuezoloft  po qDay -Continue Abilify  po qDay             -Continue abilify Maintena  IM q28 days (administered on 10/24/17)  -Anxiety -Continue atarax  po q6h prn anxiety  -Insomnia -Continue trazodone  po qhs prn insomnia(may repeat x1)  -Agitation -Continue zydis  po q8h prn agitation -Continue ativan  po once prn agiation -Continue geodon  IM once prn  agitation  -Encourage participation in groups and therapeutic milieu  -Disposition planning will be ongoing  Armandina Stammer, NP, PMHNP, FNP-BC 10/26/2017, 2:32 PMPatient ID: Lindsay [name], female   DOB: Mar 02, 1991, 27 y.o.   MRN: [id number]

## 2017-10-26 NOTE — Progress Notes (Addendum)
Nursing Progress Note: 7p-7a D: Pt currently presents with a flat/pleasant/thought blocking/conrete affect and behavior. Pt states "I want to get discharged." Interacting appropriately with the milieu. Pt reports good sleep during the previous night with current medication regimen. Pt did attend wrap-up group.  A: Pt refused sleep medications. Pt's labs and vitals were monitored throughout the night. Pt supported emotionally and encouraged to express concerns and questions. Pt educated on medications.  R: Pt's safety ensured with 15 minute and environmental checks. Pt currently denies SI, HI, and AVH. Pt verbally contracts to seek staff if SI,HI, or AVH occurs and to consult with staff before acting on any harmful thoughts. Will continue to monitor.

## 2017-10-26 NOTE — Plan of Care (Signed)
  Problem: Nutritional: Goal: Ability to achieve adequate nutritional intake will improve Outcome: Progressing   Problem: Self-Care: Goal: Ability to participate in self-care as condition permits will improve Outcome: Progressing   Problem: Self-Concept: Goal: Will verbalize positive feelings about self Outcome: Progressing   Problem: Education: Goal: Ability to make informed decisions regarding treatment will improve Outcome: Progressing D: Pt A & O X3. Pt visible in milieu and hall majority of this shift. Denies SI, HI, AVH and pain. Presents animated and in good spirits. Reports she didn't sleep well last night, appetite is fair, with normal energy and good concentration level. Rates her depression, anxiety and hopelessness all 0/10. Per pt "my medicines is making me sleepy all day, can I take it at night again please".  Pt's goal this shift "being on time to group and loving myself".  A: Introduced self to pt. Emotional support and availability provided to pt. MD notified of pt's concerns and request for time change related to medications. Scheduled medications administered with verbal education and effects monitored. Compliance with treatment regimen encouraged including groups.  Safety checks maintained at Q 15 minutes intervals without outburst or self harm gestures.   R: Pt receptive to care, cooperative with unit routines. Attended groups and was engaged. Compliant with medications when offered. Denies adverse drug reactions when assessed this shift. Tolerates all PO intake well. Remains safe on and off unit.

## 2017-10-26 NOTE — [facility] Group Notes (Addendum)
LCSW Group Therapy Note  10/26/2017 1:15pm  Type of Therapy/Topic:  Group Therapy:  Balance in Life  Participation Level:  Minimal  Description of Group:    This group will address the concept of balance and how it feels and looks when one is unbalanced. Patients will be encouraged to process areas in their lives that are out of balance and identify reasons for remaining unbalanced. Facilitators will guide patients in utilizing problem-solving interventions to address and correct the stressor making their life unbalanced. Understanding and applying boundaries will be explored and addressed for obtaining and maintaining a balanced life. Patients will be encouraged to explore ways to assertively make their unbalanced needs known to significant others in their lives, using other group members and facilitator for support and feedback.  Therapeutic Goals: 1. Patient will identify two or more emotions or situations they have that consume much of in their lives. 2. Patient will identify signs/triggers that life has become out of balance:  3. Patient will identify two ways to set boundaries in order to achieve balance in their lives:  4. Patient will demonstrate ability to communicate their needs through discussion and/or role plays  Summary of Patient Progress:  When asked  questions today, repeatedly responded by saying "I'm blessed."  Unable to relate to the topic of emotions and emotional regulation.  "I'm blessed." Asked me if I am a Christian.      Therapeutic Modalities:   Cognitive Behavioral Therapy Solution-Focused Therapy Assertiveness Training  Ida Rogue, [place] 10/26/2017 4:00 PM

## 2017-10-27 MED ORDER — ARIPIPRAZOLE 10 MG PO TABS
20.0000 mg | ORAL_TABLET | Freq: Every day | ORAL | Status: DC
  Administered 2017-10-28 – 2017-11-03 (×7): 20 mg via ORAL
  Filled 2017-10-27 (×4): qty 2
  Filled 2017-10-27: qty 14
  Filled 2017-10-27 (×5): qty 2

## 2017-10-27 NOTE — Progress Notes (Signed)
Adult Psychoeducational Group Note  Date:  10/27/2017 Time:  8:32 PM  Group Topic/Focus:  Wrap-Up Group:   The focus of this group is to help patients review their daily goal of treatment and discuss progress on daily workbooks.  Participation Level:  Active  Participation Quality:  Appropriate  Affect:  Appropriate  Cognitive:  Alert  Insight: Appropriate  Engagement in Group:  Engaged  Modes of Intervention:  Discussion  Additional Comments:  Patient stated her day was a blessing. Patient's goal for today was to walk for 15 mins. Patient stated she met her goal.   [place] 10/27/2017, 8:32 PM

## 2017-10-27 NOTE — Tx Team (Signed)
Interdisciplinary Treatment and Diagnostic Plan Update  10/27/2017 Time of Session: 9:16 AM  Lindsay [name] MRN: [id number]  Principal Diagnosis: Schizophrenia [facility])  Secondary Diagnoses: Principal Problem:   Schizophrenia ([place])   Current Medications:  Current Facility-Administered Medications  Medication Dose Route Frequency Provider Last Rate Last Dose  . acetaminophen (TYLENOL) tablet 650 mg  650 mg Oral Q6H PRN Laverle Hobby, PA-C      . alum & mag hydroxide-simeth (MAALOX/MYLANTA) 200-200-20 MG/5ML suspension 30 mL  30 mL Oral Q4H PRN Patriciaann Clan E, PA-C      . ARIPiprazole (ABILIFY) tablet 20 mg  20 mg Oral Daily Pennelope Bracken, MD   20 mg at 10/27/17 8979  . ARIPiprazole ER (ABILIFY MAINTENA) injection 400 mg  400 mg Intramuscular Q28 days Pennelope Bracken, MD   400 mg at 10/24/17 1700  . hydrOXYzine (ATARAX/VISTARIL) tablet 50 mg  50 mg Oral Q6H PRN Pennelope Bracken, MD      . OLANZapine zydis (ZYPREXA) disintegrating tablet 10 mg  10 mg Oral Q8H PRN Pennelope Bracken, MD       And  . LORazepam (ATIVAN) tablet 1 mg  1 mg Oral PRN Pennelope Bracken, MD       And  . ziprasidone (GEODON) injection 20 mg  20 mg Intramuscular PRN Pennelope Bracken, MD      . magnesium hydroxide (MILK OF MAGNESIA) suspension 30 mL  30 mL Oral Daily PRN Patriciaann Clan E, PA-C      . sertraline (ZOLOFT) tablet 50 mg  50 mg Oral Daily Pennelope Bracken, MD   50 mg at 10/27/17 1504  . traZODone (DESYREL) tablet 100 mg  100 mg Oral QHS PRN,MR X 1 [place], Randa Ngo, MD        PTA Medications: Medications Prior to Admission  Medication Sig Dispense Refill Last Dose  . acetaminophen (TYLENOL) 500 MG tablet Take 1,000 mg by mouth daily as needed.   Unknown at Unknown time  . diphenhydrAMINE (BENADRYL) 25 MG tablet Take 25 mg by mouth daily as needed for allergies.   Unknown at Unknown time  . Prenatal Vit-Fe Fumarate-FA (PRENATAL PO) Take 1  tablet by mouth daily.   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   Physician Treatment Plan for Primary Diagnosis: Schizophrenia ([place]) Long Term Goal(s): Improvement in symptoms so as ready for discharge  Short Term Goals: Ability to identify and develop effective coping behaviors will improve Compliance with prescribed medications will improve  Medication Management: Evaluate patient's response, side effects, and tolerance of medication regimen.  Therapeutic Interventions: 1 to 1 sessions, Unit Group sessions and Medication administration.  Evaluation of Outcomes: Progressing  Physician Treatment Plan for Secondary Diagnosis: Principal Problem:   Schizophrenia ([place])   Long Term Goal(s): Improvement in symptoms so as ready for discharge  Short Term Goals: Ability to identify and develop effective coping behaviors will improve Compliance with prescribed medications will improve  Medication Management: Evaluate patient's response, side effects, and tolerance of medication regimen.  Therapeutic Interventions: 1 to 1 sessions, Unit Group sessions and Medication administration.  Evaluation of Outcomes: Progressing    5/7: Pt was started on trial of zoloft and abilify, and dose of abilify was titrated up during her stay and she was also  transitioned to long-acting injectable form of Abilify Maintena. Pt has been reporting incremental improvement of her presenting symptoms. She feels that her medications have been helpful but she is unable to provide any specific examples of what has improved. -Schizophrenia -Continuezoloft 65m po qDay -Continue Abilify 240mpo  qDay -Continue abilify Maintena 40054mM q28 days (administered on 10/24/17)  5/9: "Today is fine. I think I would like to switch that medication to bedtime." Pt is referring to abilify which we had previously discussed about moving to bedtime to address symptoms of daytime fatigue. As pt has already received her AM dose today, we will plan to change her abilify to evening time starting with tomorrow's dose.     RN Treatment Plan for Primary Diagnosis: [place] Term Goal(s): Knowledge of disease and therapeutic regimen to maintain health will improve  Short Term Goals: Ability to demonstrate self-control, Ability to identify and develop effective coping behaviors will improve and Compliance with prescribed medications will improve  Medication Management: RN will administer medications as ordered by provider, will assess and evaluate patient's response and provide education to patient for prescribed medication. RN will report any adverse and/or side effects to prescribing provider.  Therapeutic Interventions: 1 on 1 counseling sessions, Psychoeducation, Medication administration, Evaluate responses to treatment, Monitor vital signs and CBGs as ordered, Perform/monitor CIWA, COWS, AIMS and Fall Risk screenings as ordered, Perform wound care treatments as ordered.  Evaluation of Outcomes: Progressing   LCSW Treatment Plan for Primary Diagnosis: [place]ia ([place] Term Goal(s): Safe transition to appropriate next level of care at discharge, Engage patient in therapeutic group addressing interpersonal concerns.  Short Term Goals: Engage patient in aftercare planning with referrals and resources  Therapeutic Interventions: Assess for all discharge needs, 1 to 1 time with Social worker, Explore available resources and support systems, Assess for adequacy in community support network, Educate family and significant other(s) on suicide prevention, Complete Psychosocial  Assessment, Interpersonal group therapy.  Evaluation of Outcomes: Met Return home, follow up outpt   Progress in Treatment: Attending groups: Yes Participating in groups:Minimally Taking medication as prescribed: Yes Toleration medication: Yes, no side effects reported at this time Family/Significant other contact made: Yes Patient understands diagnosis:No Limited insight Discussing patient identified problems/goals with staff: Yes Medical problems stabilized or resolved: Yes Denies suicidal/homicidal ideation: Yes Issues/concerns per patient self-inventory: None Other: N/A  New problem(s) identified: None identified at this time.   New Short Term/Long Term Goal(s): "I need you to be there for me, and I want to figure out what my options are."   Discharge Plan or Barriers:   Reason for Continuation of Hospitalization:  Delusions  Paranoia Medication stabilization   Estimated Length of Stay: 5/15  Attendees: Patient:  10/27/2017  9:16 AM  Physician: ChrMaris BergerD 10/27/2017  9:16 AM  Nursing: EliSena HitchN 10/27/2017  9:16 AM  RN Care Manager: JenLars PinksN 10/27/2017  9:16 AM  Social Worker: RodRipley Fraise9/2019  9:16 AM  Recreational Therapist: MarWinfield Cunas9/2019  9:16 AM  Other: DelNorberto Sorenson9/2019  9:16 AM  Other:  10/27/2017  9:16 AM    Scribe for Treatment Team:  RodRoque LiasSW 10/27/2017 9:16 AM

## 2017-10-27 NOTE — BHH Group Notes (Signed)
LCSW Group Therapy Note   10/27/2017 1:15pm   Type of Therapy and Topic:  Group Therapy:  Positive Affirmations   Participation Level:  Minimal  Description of Group: This group addressed positive affirmation toward self and others. Patients went around the room and identified two positive things about themselves and two positive things about a peer in the room. Patients reflected on how it felt to share something positive with others, to identify positive things about themselves, and to hear positive things from others. Patients were encouraged to have a daily reflection of positive characteristics or circumstances.  Therapeutic Goals 1. Patient will verbalize two of their positive qualities 2. Patient will demonstrate empathy for others by stating two positive qualities about a peer in the group 3. Patient will verbalize their feelings when voicing positive self affirmations and when voicing positive affirmations of others 4. Patients will discuss the potential positive impact on their wellness/recovery of focusing on positive traits of self and others. Summary of Patient Progress:  Out of group more than in.  Contributed that she is learning to love herself more, which means setting limits with others and saying no more.  Therapeutic Modalities Cognitive Behavioral Therapy Motivational Interviewing  Ida Rogue, [place] 10/27/2017 4:01 PM

## 2017-10-27 NOTE — Progress Notes (Signed)
[facility] MD Progress Note  10/27/2017 4:14 PM Lindsay [name]  MRN:  [id number] Subjective:    Lindsay [name] is a 27 y/o F with no known psychiatric history who was admitted voluntarily as a walk-in to [facility] brought in by her mother with complaints of worsening depression, disorganization, paranoia, tangential speech, and thoughts of hurting herself. As per collateral from pt's mother, pt's symptomssignificantly worsened inOctober 2018 when she was living with her sister, but they have had insidious onset for the past few years. Pt became paranoid and was staying outside of the apartment in her car. She was medically cleared at [facility] then returned to [facility] for additional treatment and stabilization.Pt was vague, paranoid, and minimally cooperative with initial interview. She refused to be started on medications. She later had episode of agitation on the unit requiring IM geodon to manage her behaviors. She was placed on IVC due to her agitation, disorganization, and poor insight.Pt was started on trial of zoloft and abilify, and dose of abilify was titrated up during her stay and she was also transitioned to long-acting injectable form of Abilify Maintena which she received on 10/24/17. Pt has been reporting incremental improvement of her presenting symptoms.  Today upon evaluation, pt shares, "Today is fine. I think I would like to switch that medication to bedtime." Pt is referring to abilify which we had previously discussed about moving to bedtime to address symptoms of daytime fatigue. As pt has already received her AM dose today, we will plan to change her abilify to evening time starting with tomorrow's dose. Pt reports that otherwise she is doing well overall and her mood is good. She denies SI/HI/AH/VH and paranoia. She is sleeping well. Her appetite is good. She is tolerating her medications well, and she denies any side effects aside from daytime sedation. She is in agreement to continue her  current regimen without changes. She had no further questions, comments, or concerns.  Collateral information was obtained via phone call to pt's mother, Lindsay [name], with SW team present. Pt's mother expresses concern about pt being at home alone for majority of the day while her mother is at work, and there are no other family members available to be with the patient. Pt's mother shares that she is looking into alternative options such as halfway-houses. Discussed with patient's mother about looking into day program at  [facility] which is typically for patients with insurance, but pt's mother stated she would explore this option. Pt's mother notes that pt still has some symptoms of confusion and possible psychosis, as pt had reported to her mother that she spoke with her brother but pt's brother denied that they had been in contact. Pt's mother had nothing else to share, and she had no further questions, comments, or concerns.  Principal Problem: Schizophrenia (HCC) Diagnosis:   Patient Active Problem List   Diagnosis Date Noted  . Schizophrenia (HCC) [F20.9] 10/18/2017   Total Time spent with patient: 30 minutes  Past Psychiatric History: see H&P  Past Medical History:  Past Medical History:  Diagnosis Date  . Asthma    History reviewed. No pertinent surgical history. Family History: History reviewed. No pertinent family history. Family Psychiatric  History: see H&P Social History:  Social History   Substance and Sexual Activity  Alcohol Use No     Social History   Substance and Sexual Activity  Drug Use No    Social History   Socioeconomic History  . Marital status: Single  Spouse name: Not on file  . Number of children: Not on file  . Years of education: Not on file  . Highest education level: Not on file  Occupational History  . Not on file  Social Needs  . Financial resource strain: Not on file  . Food insecurity:    Worry: Not on file    Inability: Not on file   . Transportation needs:    Medical: Not on file    Non-medical: Not on file  Tobacco Use  . Smoking status: Never Smoker  . Smokeless tobacco: Never Used  Substance and Sexual Activity  . Alcohol use: No  . Drug use: No  . Sexual activity: Not on file  Lifestyle  . Physical activity:    Days per week: Not on file    Minutes per session: Not on file  . Stress: Not on file  Relationships  . Social connections:    Talks on phone: Not on file    Gets together: Not on file    Attends religious service: Not on file    Active member of club or organization: Not on file    Attends meetings of clubs or organizations: Not on file    Relationship status: Not on file  Other Topics Concern  . Not on file  Social History Narrative  . 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 acetaminophen (TYLENOL) tablet 650 mg  650 mg Oral Q6H PRN Kerry Hough, PA-C      . alum & mag hydroxide-simeth (MAALOX/MYLANTA) 200-200-20 MG/5ML suspension 30 mL  30 mL Oral Q4H PRN Kerry Hough, PA-C      . [START ON 10/28/2017] ARIPiprazole (ABILIFY) tablet 20 mg  20 mg Oral QHS Micheal Likens, MD      . ARIPiprazole ER (ABILIFY MAINTENA) injection 400 mg  400 mg Intramuscular Q28 days Micheal Likens, MD   400 mg at 10/24/17 1700  . hydrOXYzine (ATARAX/VISTARIL) tablet 50 mg  50 mg  Oral Q6H PRN Micheal Likens, MD      . OLANZapine zydis (ZYPREXA) disintegrating tablet 10 mg  10 mg Oral Q8H PRN Micheal Likens, MD       And  . LORazepam (ATIVAN) tablet 1 mg  1 mg Oral PRN Micheal Likens, MD       And  . ziprasidone (GEODON) injection 20 mg  20 mg Intramuscular PRN Micheal Likens, MD      . magnesium hydroxide (MILK OF MAGNESIA) suspension 30 mL  30 mL Oral Daily PRN Donell Sievert E, PA-C      . sertraline (ZOLOFT) tablet 50 mg  50 mg Oral Daily Micheal Likens, MD   50 mg at 10/27/17 1610  . traZODone (DESYREL) tablet 100 mg  100 mg Oral QHS PRN,MR X 1  Micheal Likens, MD        Lab Results: No results found for this or any previous visit (from the past 48 hour(s)).  Blood Alcohol level:  Lab Results  Component Value Date   ETH <10 10/18/2017    Metabolic Disorder Labs: No results found for: HGBA1C, MPG No results found for: PROLACTIN No results found for: CHOL, TRIG, HDL, CHOLHDL, VLDL, LDLCALC  Physical Findings: AIMS: Facial and Oral Movements Muscles of Facial Expression: None, normal Lips and Perioral Area: None, normal Jaw: None, normal Tongue: None, normal,Extremity Movements Upper (arms, wrists, hands, fingers): None, normal Lower (legs, knees, ankles, toes): None, normal, Trunk Movements Neck, shoulders, hips: None, normal, Overall Severity Severity of abnormal movements (highest score from questions above): None, normal Incapacitation due to abnormal movements: None, normal Patient's awareness of abnormal movements (rate only patient's report): No Awareness, Dental Status Current problems with teeth and/or dentures?: No Does patient usually wear dentures?: No  CIWA:    COWS:     Musculoskeletal: Strength & Muscle Tone: within normal limits Gait & Station: normal Patient leans: N/A  Psychiatric Specialty Exam: Physical Exam  Nursing note and vitals reviewed.   Review of Systems   Constitutional: Negative for chills and fever.  Respiratory: Negative for cough and shortness of breath.   Cardiovascular: Negative for chest pain.  Gastrointestinal: Negative for abdominal pain, heartburn, nausea and vomiting.  Psychiatric/Behavioral: Negative for depression, hallucinations and suicidal ideas. The patient is not nervous/anxious and does not have insomnia.     Blood pressure (!) 141/87, pulse 97, temperature 98.6 F (37 C), temperature source Oral, resp. rate 20, height  (1.6 m), weight 110.2 kg (243 lb), last menstrual period 10/14/2017.Body mass index is 43.05 kg/m.  General Appearance: Casual and Fairly Groomed  Eye Contact:  Good  Speech:  Clear and Coherent and Normal Rate  Volume:  Normal  Mood:  Euthymic  Affect:  Blunt and Congruent  Thought Process:  Coherent and Goal Directed  Orientation:  Full (Time, Place, and Person)  Thought Content:  Delusions  Suicidal Thoughts:  No  Homicidal Thoughts:  No  Memory:  Immediate;   Fair Recent;   Fair Remote;   Fair  Judgement:  Fair  Insight:  Lacking  Psychomotor Activity:  Normal  Concentration:  Concentration: Fair  Recall:  [employer] of Knowledge:  Fair  Language:  Fair  Akathisia:  No  Handed:    AIMS (if indicated):     Assets:  Communication Skills Desire for Improvement Housing Physical Health Resilience Social Support  ADL's:  Intact  Cognition:  WNL  Sleep:  Number of Hours: 6.75   Treatment Plan Summary: Daily contact with patient to assess and evaluate symptoms and progress in treatment and Medication management   -Continue inpatient hospitalization  -Schizophrenia -Continuezoloft  po qDay -Change Abilify  po qDay to abilify  po qhs -Continue abilify Maintena  IM q28 days (administered on 10/24/17)  -Anxiety -Continue atarax  po q6h prn anxiety  -Insomnia -Continue trazodone  po qhs prn  insomnia(may repeat x1)  -Agitation -Continue zydis  po q8h prn agitation -Continue ativan  po once prn agiation -Continue geodon  IM once prn agitation  -Encourage participation in groups and therapeutic milieu  -Disposition planning will be ongoing  Micheal Likens, MD 10/27/2017, 4:14 PM

## 2017-10-27 NOTE — Progress Notes (Signed)
Patient denies SI, Hi and AVH this shift.  Patient has been calm and cooperative, attended groups and engaged in unit activities.  Patient has had no incidents of behavioral dyscontrol.   Assess patient for safety offer medications as prescribed, engaged patient in 1:1 staff talks.   Patient able to contract for safety. Continue to monitor as planned.  

## 2017-10-27 NOTE — Progress Notes (Signed)
Recreation Therapy Notes  Date: 5.9.19 Time: 1000 Location: 500 [address] Dayroom  Group Topic: Communication, Team Building, Problem Solving  Goal Area(s) Addresses:  Patient will effectively work with peer towards shared goal.  Patient will identify skill used to make activity successful.  Patient will identify how skills used during activity can be used to reach post d/c goals.   Behavioral Response: Engaged  Intervention: STEM Activity   Activity: Straw Bridge. In groups of 2, groups were to build a standing bridge that could hold the weight of a 60 piece puzzle box.  Each group was given 20 straws and a long piece of masking tape.  Education: Pharmacist, community, Building control surveyor.   Education Outcome: Acknowledges education/In group clarification offered/Needs additional education.   Clinical Observations/Feedback: Pt stated they used "teamwork, planned, took turns leading and listening to each other".  Pt stated with her support system, she "commuicate my issues, ask for resources and let them lead".  Pt stated she has a very supportive support system and "they are the reason I am who I am, they taught me to be very strong".   Caroll Rancher, LRT/CTRS      Caroll Rancher A 10/27/2017 12:42 PM

## 2017-10-28 NOTE — Progress Notes (Signed)
Pt is on unit with visitors.  Pt and visitors are laughing loudly in dayroom.  Pt smiling and cooperative.  Pt is med compliant.  Pt denies SI, HI and AVH. Pt offered support and encouragement.   Pt verbally contracts for safety.

## 2017-10-28 NOTE — Progress Notes (Signed)
Recreation Therapy Notes  Date: 5.10.19 Time: 1000 Location: 500 Hall Dayroom  Group Topic: Stress Management  Goal Area(s) Addresses:  Patient will verbalize importance of using healthy stress management.  Patient will identify positive emotions associated with healthy stress management.   Behavioral Response: Engaged  Intervention: Stress Management  Activity :  Loving-Kindness Meditation.  LRT played a meditation on focusing on giving love to people in our lives.  Patients were to follow along as the meditation played.  Education:  Stress Management, Discharge Planning.   Education Outcome: Acknowledges edcuation/In group clarification offered/Needs additional education  Clinical Observations/Feedback: Pt stated stress in caused from not being listened to.  Pt also expressed stress can be relieved by "spending time with friends and family".  Pt was engaged and attentive during activity.    Caroll Rancher, LRT/CTRS      Caroll Rancher A 10/28/2017 12:05 PM

## 2017-10-28 NOTE — Progress Notes (Signed)
[facility] MD Progress Note  10/28/2017 11:18 AM Lindsay [name]  MRN:  [id number] Subjective:    Lindsay [name] is a 27 y/o F with no known psychiatric history who was admitted voluntarily as a walk-in to [facility] brought in by her mother with complaints of worsening depression, disorganization, paranoia, tangential speech, and thoughts of hurting herself. As per collateral from pt's mother, pt's symptomssignificantly worsened inOctober 2018 when she was living with her sister, but they have had insidious onset for the past few years. Pt became paranoid and was staying outside of the apartment in her car. She was medically cleared at [facility] then returned to [facility] for additional treatment and stabilization.Pt was vague, paranoid, and minimally cooperative with initial interview. She refused to be started on medications. She later had episode of agitation on the unit requiring IM geodon to manage her behaviors. She was placed on IVC due to her agitation, disorganization, and poor insight.Pt was started on trial of zoloft and abilify, and dose of abilifywas titrated up during her stay and she was also transitioned to long-acting injectable form of Abilify Maintena which she received on 10/24/17. Pt has been reporting incremental improvement of her presenting symptoms.  Today upon evaluation, pt shares, "I'm fair." She appears anxious and guarded during the interview. She is somewhat vague and disorganized with some of her responses, but she is generally linear and goal-oriented. She shares, "There's nothing I can change. Well, there's something I can change. I can love whom I am. I'm blessed." She denies any specific complaints. She reports that she slept "fair." Her energy level is good today and she denies daytime sedation after changing abilify to PM dosing. She denies SI/HI/AH/VH. When asked about paranoia, pt reports she is having some and rates it "5/10" in intensity. Pt was asked to provide more details  about what she is feeling paranoia, and she shares, "I'd rather not share about that." Pt asks about her medications and asks to specifically see the ingredients of abilify. Discussed with patient that the active ingredient is aripiprazole and there are other fillers in the tablet to hold the medication together, but typically that information is not provided to the patient, and instead we could provide pt information printout on abilify, and pt appeared more anxious and paranoid, stating, "You're not answering my question at all." Discussed with patient that her mother has expressed ongoing concerns about her returning to home at this point, as pt would be home alone for significant portions of the day, and her mother was looking into alternative options such as [facility] day program. Pt verbalized good understanding. She had no further questions, comments, or concerns.  Principal Problem: Schizophrenia (HCC) Diagnosis:   Patient Active Problem List   Diagnosis Date Noted  . Schizophrenia (HCC) [F20.9] 10/18/2017   Total Time spent with patient: 30 minutes  Past Psychiatric History: see H&P  Past Medical History:  Past Medical History:  Diagnosis Date  . Asthma    History reviewed. No pertinent surgical history. Family History: History reviewed. No pertinent family history. Family Psychiatric  History: see H&P Social History:  Social History   Substance and Sexual Activity  Alcohol Use No     Social History   Substance and Sexual Activity  Drug Use No    Social History   Socioeconomic History  . Marital status: Single    Spouse name: Not on file  . Number of children: Not on file  . Years of education: Not on  file  . Highest education level: Not on file  Occupational History  . Not on file  Social Needs  . Financial resource strain: Not on file  . Food insecurity:    Worry: Not on file    Inability: Not on file  . Transportation needs:    Medical: Not on file     Non-medical: Not on file  Tobacco Use  . Smoking status: Never Smoker  . Smokeless tobacco: Never Used  Substance and Sexual Activity  . Alcohol use: No  . Drug use: No  . Sexual activity: Not on file  Lifestyle  . Physical activity:    Days per week: Not on file    Minutes per session: Not on file  . Stress: Not on file  Relationships  . Social connections:    Talks on phone: Not on file    Gets together: Not on file    Attends religious service: Not on file    Active member of club or organization: Not on file    Attends meetings of clubs or organizations: Not on file    Relationship status: Not on file  Other Topics Concern  . Not on file  Social History Narrative  . Not on file   Additional Social History:    Pain Medications: see MAR Prescriptions: see MAR Over the Counter: see MAR History of alcohol / drug use?: Yes Longest period of sobriety (when/how long): 4 months Negative Consequences of Use: Work / Programmer, multimedia, Copywriter, advertising relationships, Financial Name of Substance 1: Marijuana 1 - Age of First Use: unknown 1 - Amount (size/oz): unknown 1 - Frequency: unknown 1 - Duration: unknown 1 - Last Use / Amount: January 2019                  Sleep: Fair  Appetite:  Good  Current Medications: Current Facility-Administered Medications  Medication Dose Route Frequency Provider Last Rate Last Dose  . acetaminophen (TYLENOL) tablet 650 mg  650 mg Oral Q6H PRN Kerry Hough, PA-C      . alum & mag hydroxide-simeth (MAALOX/MYLANTA) 200-200-20 MG/5ML suspension 30 mL  30 mL Oral Q4H PRN Donell Sievert E, PA-C      . ARIPiprazole (ABILIFY) tablet 20 mg  20 mg Oral QHS Jolyne Loa T, MD      . ARIPiprazole ER (ABILIFY MAINTENA) injection 400 mg  400 mg Intramuscular Q28 days Micheal Likens, MD   400 mg at 10/24/17 1700  . hydrOXYzine (ATARAX/VISTARIL) tablet 50 mg  50 mg Oral Q6H PRN Micheal Likens, MD      . OLANZapine zydis (ZYPREXA)  disintegrating tablet 10 mg  10 mg Oral Q8H PRN Micheal Likens, MD       And  . LORazepam (ATIVAN) tablet 1 mg  1 mg Oral PRN Micheal Likens, MD       And  . ziprasidone (GEODON) injection 20 mg  20 mg Intramuscular PRN Micheal Likens, MD      . magnesium hydroxide (MILK OF MAGNESIA) suspension 30 mL  30 mL Oral Daily PRN Donell Sievert E, PA-C      . sertraline (ZOLOFT) tablet 50 mg  50 mg Oral Daily Micheal Likens, MD   50 mg at 10/28/17 0737  . traZODone (DESYREL) tablet 100 mg  100 mg Oral QHS PRN,MR X 1 Kania Regnier T, MD        Lab Results: No results found for this or any previous visit (from  the past 48 hour(s)).  Blood Alcohol level:  Lab Results  Component Value Date   ETH <10 10/18/2017    Metabolic Disorder Labs: No results found for: HGBA1C, MPG No results found for: PROLACTIN No results found for: CHOL, TRIG, HDL, CHOLHDL, VLDL, LDLCALC  Physical Findings: AIMS: Facial and Oral Movements Muscles of Facial Expression: None, normal Lips and Perioral Area: None, normal Jaw: None, normal Tongue: None, normal,Extremity Movements Upper (arms, wrists, hands, fingers): None, normal Lower (legs, knees, ankles, toes): None, normal, Trunk Movements Neck, shoulders, hips: None, normal, Overall Severity Severity of abnormal movements (highest score from questions above): None, normal Incapacitation due to abnormal movements: None, normal Patient's awareness of abnormal movements (rate only patient's report): No Awareness, Dental Status Current problems with teeth and/or dentures?: No Does patient usually wear dentures?: No  CIWA:    COWS:     Musculoskeletal: Strength & Muscle Tone: within normal limits Gait & Station: normal Patient leans: N/A  Psychiatric Specialty Exam: Physical Exam  Nursing note and vitals reviewed.   Review of Systems  Constitutional: Negative for chills and fever.  Respiratory: Negative for  cough and shortness of breath.   Cardiovascular: Negative for chest pain.  Gastrointestinal: Negative for abdominal pain, heartburn, nausea and vomiting.  Psychiatric/Behavioral: Negative for depression, hallucinations and suicidal ideas. The patient is not nervous/anxious and does not have insomnia.     Blood pressure 122/84, pulse 91, temperature 98.5 F (36.9 C), temperature source Oral, resp. rate 20, height  (1.6 m), weight 110.2 kg (243 lb), last menstrual period 10/14/2017.Body mass index is 43.05 kg/m.  General Appearance: Casual  Eye Contact:  Good  Speech:  Clear and Coherent and Normal Rate  Volume:  Normal  Mood:  Anxious  Affect:  Blunt and Congruent  Thought Process:  Coherent, Disorganized, Goal Directed and Descriptions of Associations: Loose  Orientation:  Full (Time, Place, and Person)  Thought Content:  Ideas of Reference:   Paranoia Delusions and Paranoid Ideation  Suicidal Thoughts:  No  Homicidal Thoughts:  No  Memory:  Immediate;   Fair Recent;   Fair Remote;   Fair  Judgement:  Poor  Insight:  Lacking  Psychomotor Activity:  Normal  Concentration:  Concentration: Fair  Recall:  [employer] of Knowledge:  Fair  Language:  Fair  Akathisia:  No  Handed:    AIMS (if indicated):     Assets:  Communication Skills Desire for Improvement Housing Resilience Social Support  ADL's:  Intact  Cognition:  WNL  Sleep:  Number of Hours: 6.5   Treatment Plan Summary: Daily contact with patient to assess and evaluate symptoms and progress in treatment and Medication management  -Continue inpatient hospitalization  -Schizophrenia -Continuezoloft  po qDay -Continue abilify  po qhs -Continueabilify Maintena  IM q28 days (administered on5/6/19)  -Anxiety -Continue atarax  po q6h prn anxiety  -Insomnia -Continue trazodone  po qhs prn insomnia(may repeat  x1)  -Agitation -Continue zydis  po q8h prn agitation -Continue ativan  po once prn agiation -Continue geodon  IM once prn agitation  -Encourage participation in groups and therapeutic milieu  -Disposition planning will be ongoing   Micheal Likens, MD 10/28/2017, 11:18 AM

## 2017-10-28 NOTE — Progress Notes (Signed)
Patient denies SI, Hi and AVH this shift.  Patient has been calm and cooperative, attended groups and engaged in unit activities.  Patient has had no incidents of behavioral dyscontrol.   Assess patient for safety offer medications as prescribed, engaged patient in 1:1 staff talks.   Patient able to contract for safety. Continue to monitor as planned.  

## 2017-10-28 NOTE — Progress Notes (Signed)
D: Patient denies SI, HI or AVH. Patient presents as flat and depressed but calm and cooperative.  She described her day as a blessing and states her goal was to walk for at least 15 minutes.  Pt. Is forwards little and is isolative this evening.  Pt. Denies any physical complaints.  A: Patient given emotional support from RN. Patient encouraged to come to staff with concerns and/or questions. Patient's medication routine continued. Patient's orders and plan of care reviewed.   R: Patient remains appropriate and cooperative. Will continue to monitor patient q15 minutes for safety.

## 2017-10-28 NOTE — Progress Notes (Signed)
Adult Psychoeducational Group Note  Date:  10/28/2017 Time:  9:20 PM  Group Topic/Focus:  Wrap-Up Group:   The focus of this group is to help patients review their daily goal of treatment and discuss progress on daily workbooks.  Participation Level:  Active  Participation Quality:  Appropriate  Affect:  Appropriate  Cognitive:  Appropriate  Insight: Appropriate  Engagement in Group:  Engaged  Modes of Intervention:  Discussion  Additional Comments: The patient expressed that she rates today a 7 and attended groups.The patient also said that she was blessed today.  Octavio Manns 10/28/2017, 9:20 PM

## 2017-10-28 NOTE — [facility] Group Notes (Signed)
[facility] LCSW Group Therapy  10/28/2017  1:05 PM  Type of Therapy:  Group therapy  Participation Level:  Active  Participation Quality:  Attentive  Affect:  Flat  Cognitive:  Oriented  Insight:  Limited  Engagement in Therapy:  Limited  Modes of Intervention:  Discussion, Socialization  Summary of Progress/Problems:  Chaplain was here to lead a group on themes of hope and courage. "My mother is the one who gives me hope.  I am blessed."  Talked about the importance of her faith.  "I love pink and yellow.  They are hopeful colors.  Also, I am a flower.  I am delicate, but my roots are strong."    Lindsay [name] 10/28/2017 10:45 AM

## 2017-10-29 MED ORDER — DIPHENHYDRAMINE HCL 25 MG PO CAPS
50.0000 mg | ORAL_CAPSULE | Freq: Once | ORAL | Status: AC | PRN
  Administered 2017-10-29: 50 mg via ORAL
  Filled 2017-10-29: qty 2

## 2017-10-29 NOTE — Progress Notes (Addendum)
[facility] MD Progress Note  10/29/2017 1:36 PM [name]  MRN:  [id number]  Subjective: Lindsay [name] reports, "I'm in a good state of mind today. I'm awake & I'm not depressed. I have been taking my medicines & attending groups. I'm doing really well.  Lindsay [name] is a 27 y/o F with no known psychiatric history who was admitted voluntarily as a walk-in to [facility] brought in by her mother with complaints of worsening depression, disorganization, paranoia, tangential speech, and thoughts of hurting herself. As per collateral from pt's mother, pt's symptomssignificantly worsened inOctober 2018 when she was living with her sister, but they have had insidious onset for the past few years. Pt became paranoid and was staying outside of the apartment in her car. She was medically cleared at [facility] then returned to [facility] for additional treatment and stabilization.Pt was vague, paranoid, and minimally cooperative with initial interview. She refused to be started on medications. She later had episode of agitation on the unit requiring IM geodon to manage her behaviors. She was placed on IVC due to her agitation, disorganization, and poor insight.Pt was started on trial of zoloft and abilify, and dose of abilifywas titrated up during her stay and she was also transitioned to long-acting injectable form of Abilify Maintena which she received on 10/24/17. Pt has been reporting incremental improvement of her presenting symptoms.  Today upon evaluation, pt shares, "I'm in a good state of mind. I'm awake & not depressed". She appears guarded during the interview. She is somewhat vague and disorganized with some of her responses, but she is generally linear and goal-oriented. She denies any specific complaints. She is visible on the unit, walking up & down the hall way. Says she is trying to stay active. She denies SI/HI/AH/VH.  The attending psychiatrist has discussed with patient yesterday that her mother has expressed  ongoing concerns about her returning to their home at this point, as pt would be home alone for significant portions of the day, and her mother was looking into alternative options such as [facility] day program. Pt verbalized good understanding. She had no further questions, comments, or concerns.  Principal Problem: Schizophrenia (HCC) Diagnosis:   Patient Active Problem List   Diagnosis Date Noted  . Schizophrenia (HCC) [F20.9] 10/18/2017   Total Time spent with patient: 15 minutes  Past Psychiatric History: See H&P  Past Medical History:  Past Medical History:  Diagnosis Date  . Asthma    History reviewed. No pertinent surgical history.  Family History: History reviewed. No pertinent family history. Family Psychiatric  History: see H&P Social History:  Social History   Substance and Sexual Activity  Alcohol Use No     Social History   Substance and Sexual Activity  Drug Use No    Social History   Socioeconomic History  . Marital status: Single    Spouse name: Not on file  . Number of children: Not on file  . Years of education: Not on file  . Highest education level: Not on file  Occupational History  . Not on file  Social Needs  . Financial resource strain: Not on file  . Food insecurity:    Worry: Not on file    Inability: Not on file  . Transportation needs:    Medical: Not on file    Non-medical: Not on file  Tobacco Use  . Smoking status: Never Smoker  . Smokeless tobacco: Never Used  Substance and Sexual Activity  . Alcohol use: No  .  Drug use: No  . Sexual activity: Not on file  Lifestyle  . Physical activity:    Days per week: Not on file    Minutes per session: Not on file  . Stress: Not on file  Relationships  . Social connections:    Talks on phone: Not on file    Gets together: Not on file    Attends religious service: Not on file    Active member of club or organization: Not on file    Attends meetings of clubs or organizations: Not on  file    Relationship status: Not on file  Other Topics Concern  . Not on file  Social History Narrative  . Not on file   Additional Social History:  Pain Medications: see MAR Prescriptions: see MAR Over the Counter: see MAR History of alcohol / drug use?: Yes Longest period of sobriety (when/how long): 4 months Negative Consequences of Use: Work / Programmer, multimedia, Copywriter, advertising relationships, Financial Name of Substance 1: Marijuana 1 - Age of First Use: unknown 1 - Amount (size/oz): unknown 1 - Frequency: unknown 1 - Duration: unknown 1 - Last Use / Amount: January 2019  Sleep: Fair  Appetite:  Good  Current Medications: Current Facility-Administered Medications  Medication Dose Route Frequency Provider Last Rate Last Dose  . acetaminophen (TYLENOL) tablet 650 mg  650 mg Oral Q6H PRN Kerry Hough, PA-C      . alum & mag hydroxide-simeth (MAALOX/MYLANTA) 200-200-20 MG/5ML suspension 30 mL  30 mL Oral Q4H PRN Donell Sievert E, PA-C      . ARIPiprazole (ABILIFY) tablet 20 mg  20 mg Oral QHS Micheal Likens, MD   20 mg at 10/28/17 2101  . ARIPiprazole ER (ABILIFY MAINTENA) injection 400 mg  400 mg Intramuscular Q28 days Micheal Likens, MD   400 mg at 10/24/17 1700  . hydrOXYzine (ATARAX/VISTARIL) tablet 50 mg  50 mg Oral Q6H PRN Micheal Likens, MD      . OLANZapine zydis (ZYPREXA) disintegrating tablet 10 mg  10 mg Oral Q8H PRN Micheal Likens, MD       And  . LORazepam (ATIVAN) tablet 1 mg  1 mg Oral PRN Micheal Likens, MD       And  . ziprasidone (GEODON) injection 20 mg  20 mg Intramuscular PRN Micheal Likens, MD      . magnesium hydroxide (MILK OF MAGNESIA) suspension 30 mL  30 mL Oral Daily PRN Donell Sievert E, PA-C      . sertraline (ZOLOFT) tablet 50 mg  50 mg Oral Daily Micheal Likens, MD   50 mg at 10/29/17 0824  . traZODone (DESYREL) tablet 100 mg  100 mg Oral QHS PRN,MR X 1 [place], Christopher T, MD         Lab Results: No results found for this or any previous visit (from the past 48 hour(s)).  Blood Alcohol level:  Lab Results  Component Value Date   ETH <10 10/18/2017   Metabolic Disorder Labs: No results found for: HGBA1C, MPG No results found for: PROLACTIN No results found for: CHOL, TRIG, HDL, CHOLHDL, VLDL, LDLCALC  Physical Findings: AIMS: Facial and Oral Movements Muscles of Facial Expression: None, normal Lips and Perioral Area: None, normal Jaw: None, normal Tongue: None, normal,Extremity Movements Upper (arms, wrists, hands, fingers): None, normal Lower (legs, knees, ankles, toes): None, normal, Trunk Movements Neck, shoulders, hips: None, normal, Overall Severity Severity of abnormal movements (highest score from questions above): None,  normal Incapacitation due to abnormal movements: None, normal Patient's awareness of abnormal movements (rate only patient's report): No Awareness, Dental Status Current problems with teeth and/or dentures?: No Does patient usually wear dentures?: No  CIWA:    COWS:     Musculoskeletal: Strength & Muscle Tone: within normal limits Gait & Station: normal Patient leans: N/A  Psychiatric Specialty Exam: Physical Exam  Nursing note and vitals reviewed.   Review of Systems  Constitutional: Negative for chills and fever.  Respiratory: Negative for cough and shortness of breath.   Cardiovascular: Negative for chest pain.  Gastrointestinal: Negative for abdominal pain, heartburn, nausea and vomiting.  Psychiatric/Behavioral: Negative for depression, hallucinations and suicidal ideas. The patient is not nervous/anxious and does not have insomnia.     Blood pressure 133/83, pulse 96, temperature 98.1 F (36.7 C), temperature source Oral, resp. rate 20, height  (1.6 m), weight 110.2 kg (243 lb), last menstrual period 10/14/2017.Body mass index is 43.05 kg/m.  General Appearance: Casual  Eye Contact:  Good  Speech:  Clear  and Coherent and Normal Rate  Volume:  Normal  Mood:  Anxious  Affect:  Blunt and Congruent  Thought Process:  Coherent, Disorganized, Goal Directed and Descriptions of Associations: Loose  Orientation:  Full (Time, Place, and Person)  Thought Content:  Ideas of Reference:   Paranoia Delusions and Paranoid Ideation  Suicidal Thoughts:  No  Homicidal Thoughts:  No  Memory:  Immediate;   Fair Recent;   Fair Remote;   Fair  Judgement:  Poor  Insight:  Lacking  Psychomotor Activity:  Normal  Concentration:  Concentration: Fair  Recall:  [employer] of Knowledge:  Fair  Language:  Fair  Akathisia:  No  Handed:    AIMS (if indicated):     Assets:  Communication Skills Desire for Improvement Housing Resilience Social Support  ADL's:  Intact  Cognition:  WNL  Sleep:  Number of Hours: 6.5   Treatment Plan Summary: Daily contact with patient to assess and evaluate symptoms and progress in treatment and Medication management  -Continue inpatient hospitalization.  -Will continue today 10/29/2017 plan as below except where it is noted.  -Schizophrenia -Continuezoloft  po qDay -Continue abilify  po qhs -Continueabilify Maintena  IM q28 days (administered on5/6/19)  -Anxiety -Continue atarax  po q6h prn anxiety  -Insomnia -Continue trazodone  po qhs prn insomnia(may repeat x1)  -Agitation -Continue zydis  po q8h prn agitation -Continue ativan  po once prn agiation -Continue geodon  IM once prn agitation  -Encourage participation in groups and therapeutic milieu  -Disposition planning will be ongoing   Armandina Stammer, NP, pmhnp, fnp-bc 10/29/2017, 1:36 PMPatient ID: [name], female   DOB: 02-May-1991, 27 y.o.   MRN: [id number] .Marland KitchenAgree with NP Progress Note

## 2017-10-29 NOTE — [facility] Group Notes (Signed)
[facility] Group Notes: (Clinical Social Work)   10/29/2017      Type of Therapy:  Group Therapy   Participation Level:  Did Not Attend - sleeping   Brylee Berk Grossman-Orr, LCSW 10/29/2017, 12:03 PM 

## 2017-10-29 NOTE — Progress Notes (Signed)
Patient denies SI, Hi and AVH this shift.  Patient has been calm and cooperative, attended groups and engaged in unit activities.  Patient has had no incidents of behavioral dyscontrol.   Assess patient for safety offer medications as prescribed, engaged patient in 1:1 staff talks.   Patient able to contract for safety. Continue to monitor as planned.  

## 2017-10-29 NOTE — Plan of Care (Signed)
D: Pt denies SI/HI/AVH. Pt is pleasant and cooperative. Pt stated she was doing better" good state of mind, love myself more, feel protected" , pt visible on the unit interacting with peers. Pt stated she was having issues with her Jaws which was causing her to have a lisp, pt was given 50 mg Benadryl and will continue to monitor.   A: Pt was offered support and encouragement. Pt was given scheduled medications. Pt was encourage to attend groups. Q 15 minute checks were done for safety.   R:Pt attends groups and interacts well with peers and staff. Pt is taking medication. Pt has no complaints.Pt receptive to treatment and safety maintained on unit.   Problem: Self-Concept: Goal: Level of anxiety will decrease Outcome: Progressing   Problem: Education: Goal: Emotional status will improve Outcome: Progressing   Problem: Education: Goal: Mental status will improve Outcome: Progressing   Problem: Activity: Goal: Sleeping patterns will improve Outcome: Progressing   Problem: Safety: Goal: Periods of time without injury will increase Outcome: Progressing

## 2017-10-30 MED ORDER — OLANZAPINE 10 MG PO TBDP: 10.0000 mg | ORAL_TABLET | Freq: Three times a day (TID) | ORAL | Status: DC | PRN

## 2017-10-30 MED ORDER — ZIPRASIDONE MESYLATE 20 MG IM SOLR: 20.0000 mg | INTRAMUSCULAR | Status: DC | PRN

## 2017-10-30 MED ORDER — HYDROXYZINE HCL 50 MG PO TABS
50.0000 mg | ORAL_TABLET | Freq: Four times a day (QID) | ORAL | Status: DC | PRN
  Administered 2017-11-02: 50 mg via ORAL
  Filled 2017-10-30: qty 1
  Filled 2017-10-30: qty 10

## 2017-10-30 MED ORDER — LORAZEPAM 1 MG PO TABS: 1.0000 mg | ORAL_TABLET | ORAL | Status: DC | PRN

## 2017-10-30 NOTE — Progress Notes (Signed)
D:  Patient's self inventory sheet, patient has poor sleep, no sleep medication given.  Good appetite, normal energy level, good concentration.  Denied depression, hopeless and anxiety.  Denied withdrawals.  Denied SI.  Denied physical problems.  Denied physical pain.  Goal is emotional.  Plans to pay attention.  Does have discharge plans. A:  Medications administered per MD orders.  Emotional support and  encouragement given patient.  Safety maintained with 15 minute checks.

## 2017-10-30 NOTE — Progress Notes (Addendum)
[facility] MD Progress Note  10/30/2017 3:41 PM Lindsay [name]  MRN:  [id number]  Subjective: Lindsay [name] reports, "I'm doing well. Trying to read the bible. I'm reading the Proverb & Galatians today".  Lindsay [name] is a 27 y/o F with no known psychiatric history who was admitted voluntarily as a walk-in to [facility] brought in by her mother with complaints of worsening depression, disorganization, paranoia, tangential speech, and thoughts of hurting herself. As per collateral from pt's mother, pt's symptomssignificantly worsened inOctober 2018 when she was living with her sister, but they have had insidious onset for the past few years. Pt became paranoid and was staying outside of the apartment in her car. She was medically cleared at [facility] then returned to [facility] for additional treatment and stabilization.Pt was vague, paranoid, and minimally cooperative with initial interview. She refused to be started on medications. She later had episode of agitation on the unit requiring IM geodon to manage her behaviors. She was placed on IVC due to her agitation, disorganization, and poor insight.Pt was started on trial of zoloft and abilify, and dose of abilifywas titrated up during her stay and she was also transitioned to long-acting injectable form of Abilify Maintena which she received on 10/24/17. Pt has been reporting incremental improvement of her presenting symptoms.  Today upon evaluation, pt shares, "I'm doing well. Trying to read the bible". She is alert & oriented. She is visible on the unit, walking up & down the hall way at times. Will spend some time at the dayroom as well. She denies SI/HI/AH/VH.  The attending psychiatrist has discussed with patient 2 days ago that her mother has expressed ongoing concerns about her returning to their home at this point, as pt would be home alone for significant portions of the day, and her mother was looking into alternative options such as [facility] day program. Pt  verbalized good understanding then. She is taking & tolerating her treatment regimen. Denies any adverse effects. She had no further questions, comments, or concerns at this time.  Principal Problem: Schizophrenia (HCC)  Diagnosis:   Patient Active Problem List   Diagnosis Date Noted  . Schizophrenia (HCC) [F20.9] 10/18/2017   Total Time spent with patient: 15 minutes  Past Psychiatric History: See H&P  Past Medical History:  Past Medical History:  Diagnosis Date  . Asthma    History reviewed. No pertinent surgical history.  Family History: History reviewed. No pertinent family history.  Family Psychiatric  History: See H&P  Social History:  Social History   Substance and Sexual Activity  Alcohol Use No     Social History   Substance and Sexual Activity  Drug Use No    Social History   Socioeconomic History  . Marital status: Single    Spouse name: Not on file  . Number of children: Not on file  . Years of education: Not on file  . Highest education level: Not on file  Occupational History  . Not on file  Social Needs  . Financial resource strain: Not on file  . Food insecurity:    Worry: Not on file    Inability: Not on file  . Transportation needs:    Medical: Not on file    Non-medical: Not on file  Tobacco Use  . Smoking status: Never Smoker  . Smokeless tobacco: Never Used  Substance and Sexual Activity  . Alcohol use: No  . Drug use: No  . Sexual activity: Not on file  Lifestyle  .  Physical activity:    Days per week: Not on file    Minutes per session: Not on file  . Stress: Not on file  Relationships  . Social connections:    Talks on phone: Not on file    Gets together: Not on file    Attends religious service: Not on file    Active member of club or organization: Not on file    Attends meetings of clubs or organizations: Not on file    Relationship status: Not on file  Other Topics Concern  . Not on file  Social History Narrative  .  Not on file   Additional Social History:  Pain Medications: see MAR Prescriptions: see MAR Over the Counter: see MAR History of alcohol / drug use?: Yes Longest period of sobriety (when/how long): 4 months Negative Consequences of Use: Work / Programmer, multimedia, Copywriter, advertising relationships, Financial Name of Substance 1: Marijuana 1 - Age of First Use: unknown 1 - Amount (size/oz): unknown 1 - Frequency: unknown 1 - Duration: unknown 1 - Last Use / Amount: January 2019  Sleep: Fair  Appetite:  Good  Current Medications: Current Facility-Administered Medications  Medication Dose Route Frequency Provider Last Rate Last Dose  . acetaminophen (TYLENOL) tablet 650 mg  650 mg Oral Q6H PRN Kerry Hough, PA-C      . alum & mag hydroxide-simeth (MAALOX/MYLANTA) 200-200-20 MG/5ML suspension 30 mL  30 mL Oral Q4H PRN Donell Sievert E, PA-C      . ARIPiprazole (ABILIFY) tablet 20 mg  20 mg Oral QHS Micheal Likens, MD   20 mg at 10/29/17 2103  . ARIPiprazole ER (ABILIFY MAINTENA) injection 400 mg  400 mg Intramuscular Q28 days Micheal Likens, MD   400 mg at 10/24/17 1700  . hydrOXYzine (ATARAX/VISTARIL) tablet 50 mg  50 mg Oral Q6H PRN Micheal Likens, MD      . OLANZapine zydis (ZYPREXA) disintegrating tablet 10 mg  10 mg Oral Q8H PRN Micheal Likens, MD       And  . LORazepam (ATIVAN) tablet 1 mg  1 mg Oral PRN Micheal Likens, MD       And  . ziprasidone (GEODON) injection 20 mg  20 mg Intramuscular PRN Micheal Likens, MD      . magnesium hydroxide (MILK OF MAGNESIA) suspension 30 mL  30 mL Oral Daily PRN Donell Sievert E, PA-C      . sertraline (ZOLOFT) tablet 50 mg  50 mg Oral Daily Micheal Likens, MD   50 mg at 10/30/17 0755  . traZODone (DESYREL) tablet 100 mg  100 mg Oral QHS PRN,MR X 1 [place], Christopher T, MD        Lab Results: No results found for this or any previous visit (from the past 48 hour(s)).  Blood Alcohol  level:  Lab Results  Component Value Date   ETH <10 10/18/2017   Metabolic Disorder Labs: No results found for: HGBA1C, MPG No results found for: PROLACTIN No results found for: CHOL, TRIG, HDL, CHOLHDL, VLDL, LDLCALC  Physical Findings: AIMS: Facial and Oral Movements Muscles of Facial Expression: None, normal Lips and Perioral Area: None, normal Jaw: None, normal Tongue: None, normal,Extremity Movements Upper (arms, wrists, hands, fingers): None, normal Lower (legs, knees, ankles, toes): None, normal, Trunk Movements Neck, shoulders, hips: None, normal, Overall Severity Severity of abnormal movements (highest score from questions above): None, normal Incapacitation due to abnormal movements: None, normal Patient's awareness of abnormal movements (rate  only patient's report): No Awareness, Dental Status Current problems with teeth and/or dentures?: No Does patient usually wear dentures?: No  CIWA:  CIWA-Ar Total: 1 COWS:  COWS Total Score: 1  Musculoskeletal: Strength & Muscle Tone: within normal limits Gait & Station: normal Patient leans: N/A  Psychiatric Specialty Exam: Physical Exam  Nursing note and vitals reviewed.   Review of Systems  Constitutional: Negative for chills and fever.  Respiratory: Negative for cough and shortness of breath.   Cardiovascular: Negative for chest pain.  Gastrointestinal: Negative for abdominal pain, heartburn, nausea and vomiting.  Psychiatric/Behavioral: Negative for depression, hallucinations and suicidal ideas. The patient is not nervous/anxious and does not have insomnia.     Blood pressure 127/83, pulse 76, temperature 99 F (37.2 C), temperature source Oral, resp. rate 20, height  (1.6 m), weight 110.2 kg (243 lb), last menstrual period 10/14/2017.Body mass index is 43.05 kg/m.  General Appearance: Casual  Eye Contact:  Good  Speech:  Clear and Coherent and Normal Rate  Volume:  Normal  Mood:  Anxious  Affect:  Blunt  and Congruent  Thought Process:  Coherent, Disorganized, Goal Directed and Descriptions of Associations: Loose  Orientation:  Full (Time, Place, and Person)  Thought Content:  Ideas of Reference:   Paranoia Delusions and Paranoid Ideation  Suicidal Thoughts:  No  Homicidal Thoughts:  No  Memory:  Immediate;   Fair Recent;   Fair Remote;   Fair  Judgement:  Poor  Insight:  Lacking  Psychomotor Activity:  Normal  Concentration:  Concentration: Fair  Recall:  [employer] of Knowledge:  Fair  Language:  Fair  Akathisia:  No  Handed:    AIMS (if indicated):     Assets:  Communication Skills Desire for Improvement Housing Resilience Social Support  ADL's:  Intact  Cognition:  WNL  Sleep:  Number of Hours: 6.75   Treatment Plan Summary: Daily contact with patient to assess and evaluate symptoms and progress in treatment and Medication management  -Continue inpatient hospitalization.  -Will continue today 10/30/2017 plan as below except where it is noted.  -Schizophrenia -Continuezoloft  po qDay -Continue abilify  po qhs -Continueabilify Maintena  IM q28 days (administered on5/6/19)  -Anxiety -Continue atarax  po q6h prn anxiety  -Insomnia -Continue trazodone  po qhs prn insomnia(may repeat x1)  -Agitation -Continue zydis  po q8h prn agitation -Continue ativan  po once prn agiation -Continue geodon  IM once prn agitation  -Encourage participation in groups and therapeutic milieu  -Disposition planning will be ongoing   Armandina Stammer, NP, pmhnp, fnp-bc 10/30/2017, 3:41 PMPatient ID: [name], female   DOB: Aug 22, 1990, 27 y.o.   MRN: [id number] .Marland KitchenAgree with NP Progress Note

## 2017-10-30 NOTE — Plan of Care (Signed)
D: Pt denies SI/HI/AVH. Pt is pleasant and cooperative. Pt visible on the unit , appears anxious at times. Pt visibly pacing at times. Pt presents bizarre at times, but appears calm throughout the evening.   A: Pt was offered support and encouragement. Pt was given scheduled medications. Pt was encourage to attend groups. Q 15 minute checks were done for safety.   R: Pt is taking medication. Pt has no complaints.Pt receptive to treatment and safety maintained on unit.   Problem: Self-Concept: Goal: Level of anxiety will decrease Outcome: Progressing   Problem: Education: Goal: Emotional status will improve Outcome: Progressing   Problem: Activity: Goal: Sleeping patterns will improve Outcome: Progressing

## 2017-10-30 NOTE — Progress Notes (Signed)
Patient walking hall this morning, reading paper out loud to herself.

## 2017-10-30 NOTE — Plan of Care (Signed)
Nurse discussed anxiety, depression, coping skills with patient. 

## 2017-10-30 NOTE — Progress Notes (Signed)
Pt stated the benadryl helped her with the issue she was having with her jaw.

## 2017-10-30 NOTE — B[facility] Group Notes (Signed)
[facility] LCSW Group Therapy Note  Date/Time:  10/30/2017  11:00AM-12:00PM  Type of Therapy and Topic:  Group Therapy:  Music and Mood  Participation Level:  Active   Description of Group: In this process group, members listened to a variety of genres of music and identified that different types of music evoke different responses.  Patients were encouraged to identify music that was soothing for them and music that was energizing for them.  Patients discussed how this knowledge can help with wellness and recovery in various ways including managing depression and anxiety as well as encouraging healthy sleep habits.    Therapeutic Goals: 1. Patients will explore the impact of different varieties of music on mood 2. Patients will verbalize the thoughts they have when listening to different types of music 3. Patients will identify music that is soothing to them as well as music that is energizing to them 4. Patients will discuss how to use this knowledge to assist in maintaining wellness and recovery 5. Patients will explore the use of music as a coping skill  Summary of Patient Progress:  At the beginning of group, patient expressed that she felt "in a good state of mind."  She would get very excited over a song, and then halfway through the song would raise her hand for the song to be changed.  She stated at the end of group "music is my everything, it gives me life."  Therapeutic Modalities: Solution Focused Brief Therapy Activity   Ambrose Mantle, LCSW

## 2017-10-30 NOTE — Progress Notes (Signed)
Patient's mother requested lab work results whether patient had any drugs in her system.  Mom Lindsay [name] is on the approved list.  Patient was with her mother and nurse and agreed that nurse could talk to her mother.  UDS was negative.

## 2017-10-31 NOTE — BHH Group Notes (Signed)
[facility] Group Notes:  (Nursing/MHT/Case Management/Adjunct)  Date:  10/31/2017  Time:  11:19 AM  Type of Therapy:  Orientation & Goals Group  Participation Level:  Active  Participation Quality:  Appropriate  Affect:  Appropriate  Cognitive:  Appropriate  Insight:  Appropriate  Engagement in Group:  Engaged  Modes of Intervention:  Discussion and Orientation  Summary of Progress/Problems: Her goal is to care more and to be more open about her feelings.   Conya Ellinwood J Zakyria Metzinger 10/31/2017, 11:19 AM

## 2017-10-31 NOTE — Progress Notes (Signed)
Recreation Therapy Notes  Date: 5.13.19 Time: 0950 Location: [address] Dayroom  Group Topic: Goal Setting  Goal Area(s) Addresses:  Patient will be able to identify at least 3 life goals.  Patient will be able to identify benefit of investing in goals.  Patient will be able to identify benefit of setting life goals.   Behavioral Response:  Engaged  Intervention: Worksheet  Activity: Garment/textile technologist.  Patients were to identify goals they wanted to accomplish in a week, month, year and 5 years.  Patients were to then identify any obstacles they would face, what they need to accomplish their goal and what they can start doing to reach their goals.  Education:  Discharge Planning, Pharmacologist, Leisure Education   Education Outcome: Acknowledges Education/In Group Clarification Provided/Needs Additional Education  Clinical Observations: Pt stated goals can be geared towards "physical and cognitive" wellbeing.  Pt stated she wants to go home in the next week; "love others/take care of myself" in the next month; "help self and others by staying healthy" in a year; and do more volunteering in the five years.  Pt identified her obstacles as "asking for more help from family and friends"; be more persistent in "daily, monthly and yearly goals" and get started by "continuing 31 day challenge".   Caroll Rancher, LRT/CTRS      Lillia Abed, Adelae Yodice A 10/31/2017 10:56 AM

## 2017-10-31 NOTE — Progress Notes (Signed)
D:  Patient's self inventory sheet, patient has poor sleep, no sleep medication given.  Good appetite, normal energy level, good concentration.  Denied depression, hopeless and anxiety.  Withdrawals.  Denied SI.  Denied physical problems.  Denied physical pain.  Goal is to care more.  Plans to give more love.  Does have discharge plans. A:  Medications administered per MD orders.  Emotional support and encouragement given patient. R:  Denied SI and HI while talking to nurse this morning.  Denied A/V hallucinations also while talking to nurse.  Contracts for safety.  Safety maintained with 15 minute checks.

## 2017-10-31 NOTE — Tx Team (Signed)
Interdisciplinary Treatment and Diagnostic Plan Update  10/31/2017 Time of Session: [place] MRN: [id number]  Principal Diagnosis: Schizophrenia [facility])  Secondary Diagnoses: Principal Problem:   Schizophrenia ([place])   Current Medications:  Current Facility-Administered Medications  Medication Dose Route Frequency Provider Last Rate Last Dose  . acetaminophen (TYLENOL) tablet 650 mg  650 mg Oral Q6H PRN Laverle Hobby, PA-C      . alum & mag hydroxide-simeth (MAALOX/MYLANTA) 200-200-20 MG/5ML suspension 30 mL  30 mL Oral Q4H PRN Patriciaann Clan E, PA-C      . ARIPiprazole (ABILIFY) tablet 20 mg  20 mg Oral QHS Pennelope Bracken, MD   20 mg at 10/30/17 2050  . ARIPiprazole ER (ABILIFY MAINTENA) injection 400 mg  400 mg Intramuscular Q28 days Pennelope Bracken, MD   400 mg at 10/24/17 1700  . hydrOXYzine (ATARAX/VISTARIL) tablet 50 mg  50 mg Oral Q6H PRN Pennelope Bracken, MD      . OLANZapine zydis (ZYPREXA) disintegrating tablet 10 mg  10 mg Oral Q8H PRN Pennelope Bracken, MD       And  . LORazepam (ATIVAN) tablet 1 mg  1 mg Oral PRN Pennelope Bracken, MD       And  . ziprasidone (GEODON) injection 20 mg  20 mg Intramuscular PRN Pennelope Bracken, MD      . magnesium hydroxide (MILK OF MAGNESIA) suspension 30 mL  30 mL Oral Daily PRN Patriciaann Clan E, PA-C      . sertraline (ZOLOFT) tablet 50 mg  50 mg Oral Daily Pennelope Bracken, MD   50 mg at 10/31/17 0733  . traZODone (DESYREL) tablet 100 mg  100 mg Oral QHS PRN,MR X 1 [place], Randa Ngo, MD        PTA Medications: Medications Prior to Admission  Medication Sig Dispense Refill Last Dose  . acetaminophen (TYLENOL) 500 MG tablet Take 1,000 mg by mouth daily as needed.   Unknown at Unknown time  . diphenhydrAMINE (BENADRYL) 25 MG tablet Take 25 mg by mouth daily as needed for allergies.   Unknown at Unknown time  . Prenatal Vit-Fe Fumarate-FA (PRENATAL PO) Take 1  tablet by mouth daily.   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   Physician Treatment Plan for Primary Diagnosis: Schizophrenia ([place]) Long Term Goal(s): Improvement in symptoms so as ready for discharge  Short Term Goals: Ability to identify and develop effective coping behaviors will improve Compliance with prescribed medications will improve  Medication Management: Evaluate patient's response, side effects, and tolerance of medication regimen.  Therapeutic Interventions: 1 to 1 sessions, Unit Group sessions and Medication administration.  Evaluation of Outcomes: Progressing  Physician Treatment Plan for Secondary Diagnosis: Principal Problem:   Schizophrenia ([place])   Long Term Goal(s): Improvement in symptoms so as ready for discharge  Short Term Goals: Ability to identify and develop effective coping behaviors will improve Compliance with prescribed medications will improve  Medication Management: Evaluate patient's response, side effects, and tolerance of medication regimen.  Therapeutic Interventions: 1 to 1 sessions, Unit Group sessions and Medication administration.  Evaluation of Outcomes: Progressing    5/7: Pt was started on trial of zoloft and abilify, and dose of abilify was titrated up during her stay and she was also transitioned  to long-acting injectable form of Abilify Maintena. Pt has been reporting incremental improvement of her presenting symptoms. She feels that her medications have been helpful but she is unable to provide any specific examples of what has improved. -Schizophrenia -Continuezoloft 44m po qDay -Continue Abilify 260mpo  qDay -Continue abilify Maintena 40058mM q28 days (administered on 10/24/17)  5/9: "Today is fine. I think I would like to switch that medication to bedtime." Pt is referring to abilify which we had previously discussed about moving to bedtime to address symptoms of daytime fatigue. As pt has already received her AM dose today, we will plan to change her abilify to evening time starting with tomorrow's dose.     RN Treatment Plan for Primary Diagnosis: [place]izophrenia ([place]CC[place] Term Goal(s): Knowledge of disease and therapeutic regimen to maintain health will improve  Short Term Goals: Ability to demonstrate self-control, Ability to identify and develop effective coping behaviors will improve and Compliance with prescribed medications will improve  Medication Management: RN will administer medications as ordered by provider, will assess and evaluate patient's response and provide education to patient for prescribed medication. RN will report any adverse and/or side effects to prescribing provider.  Therapeutic Interventions: 1 on 1 counseling sessions, Psychoeducation, Medication administration, Evaluate responses to treatment, Monitor vital signs and CBGs as ordered, Perform/monitor CIWA, COWS, AIMS and Fall Risk screenings as ordered, Perform wound care treatments as ordered.  Evaluation of Outcomes: Progressing   LCSW Treatment Plan for Primary Diagnosis: [place]izophrenia ([place] Term Goal(s): Safe transition to appropriate next level of care at discharge, Engage patient in therapeutic group addressing interpersonal concerns.  Short Term Goals: Engage patient in aftercare planning with referrals and resources  Therapeutic Interventions: Assess for all discharge needs, 1 to 1 time with Social worker, Explore available resources and support systems, Assess for adequacy in community support network, Educate family and significant other(s) on suicide prevention, Complete Psychosocial  Assessment, Interpersonal group therapy.  Evaluation of Outcomes: Met Return home, follow up outpt   Progress in Treatment: Attending groups: Yes Participating in groups:Yes Taking medication as prescribed: Yes Toleration medication: Yes, no side effects reported at this time Family/Significant other contact made: Yes Patient understands diagnosis:No Limited insight Discussing patient identified problems/goals with staff: Yes Medical problems stabilized or resolved: Yes Denies suicidal/homicidal ideation: Yes Issues/concerns per patient self-inventory: None Other: N/A  New problem(s) identified: None identified at this time.   New Short Term/Long Term Goal(s): "I need you to be there for me, and I want to figure out what my options are."   Discharge Plan or Barriers:   Reason for Continuation of Hospitalization:  Delusions  Paranoia Medication stabilization   Estimated Length of Stay: 5/15  Attendees: Patient: 10/31/2017   Physician: Dr RaiNancy Fetter13/2019   Nursing: BevGrayland OrmondN 10/31/2017   RN Care Manager: 10/31/2017   Social Worker: GreLurline IdolCSW 10/31/2017   Recreational Therapist:  10/31/2017   Other:  10/31/2017   Other:  10/31/2017   Other: 10/31/2017        Scribe for Treatment Team:  GreLurline IdolSW 10/31/2017 11:01 AM

## 2017-10-31 NOTE — Progress Notes (Signed)
[facility] MD Progress Note  10/31/2017 2:21 PM Lindsay [name]  MRN:  [id number] Subjective:    Lindsay [name] is a 27 y/o F with no known psychiatric history who was admitted voluntarily as a walk-in to [facility] brought in by her mother with complaints of worsening depression, disorganization, paranoia, tangential speech, and thoughts of hurting herself. As per collateral from pt's mother, pt's symptomssignificantly worsened inOctober 2018 when she was living with her sister, but they have had insidious onset for the past few years. Pt became paranoid and was staying outside of the apartment in her car. She was medically cleared at [facility] then returned to [facility] for additional treatment and stabilization.Pt was vague, paranoid, and minimally cooperative with initial interview. She refused to be started on medications. She later had episode of agitation on the unit requiring IM geodon to manage her behaviors. She was placed on IVC due to her agitation, disorganization, and poor insight.Pt was started on trial of zoloft and abilify, and dose of abilifywas titrated up during her stay and she was also transitioned to long-acting injectable form of Abilify Maintenawhich she received on 10/24/17. Pt has been reporting incremental improvement of her presenting symptoms.  Today upon evaluation,pt shares, "I'm in a good state of mind." She remains somewhat guarded and minimal with her responses. She continues to endorse paranoia of "feeling like other people are watching me," but she reports that overall her symptoms have diminished during her stay. She endorses AH but she is hesitant to describe the exact details; however, she explains, "I hear a lot but I don't pay it any mind." Pt reports that she is sleeping well. Her appetite is good. She denies SI/HI/VH. She is tolerating her medications well. SW team is working to establish outpatient follow up, possibly at [facility], and treatment team has been coordinating with  pt's mother regarding discharge plan. Pt's mother expresses concern about pt being at home, alone for significant portions of the day, and she is looking into taking time off from her schedule later in the week. Pt is in agreement with the above plan, and she had no further questions, comments, or concerns.   Principal Problem: Schizophrenia (HCC) Diagnosis:   Patient Active Problem List   Diagnosis Date Noted  . Schizophrenia (HCC) [F20.9] 10/18/2017   Total Time spent with patient: 30 minutes  Past Psychiatric History: see H&P  Past Medical History:  Past Medical History:  Diagnosis Date  . Asthma    History reviewed. No pertinent surgical history. Family History: History reviewed. No pertinent family history. Family Psychiatric  History: see H&P Social History:  Social History   Substance and Sexual Activity  Alcohol Use No     Social History   Substance and Sexual Activity  Drug Use No    Social History   Socioeconomic History  . Marital status: Single    Spouse name: Not on file  . Number of children: Not on file  . Years of education: Not on file  . Highest education level: Not on file  Occupational History  . Not on file  Social Needs  . Financial resource strain: Not on file  . Food insecurity:    Worry: Not on file    Inability: Not on file  . Transportation needs:    Medical: Not on file    Non-medical: Not on file  Tobacco Use  . Smoking status: Never Smoker  . Smokeless tobacco: Never Used  Substance and Sexual Activity  .  Alcohol use: No  . Drug use: No  . Sexual activity: Not on file  Lifestyle  . Physical activity:    Days per week: Not on file    Minutes per session: Not on file  . Stress: Not on file  Relationships  . Social connections:    Talks on phone: Not on file    Gets together: Not on file    Attends religious service: Not on file    Active member of club or organization: Not on file    Attends meetings of clubs or  organizations: Not on file    Relationship status: Not on file  Other Topics Concern  . Not on file  Social History Narrative  . 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 acetaminophen (TYLENOL) tablet 650 mg  650 mg Oral Q6H PRN Kerry Hough, PA-C      . alum & mag hydroxide-simeth (MAALOX/MYLANTA) 200-200-20 MG/5ML suspension 30 mL  30 mL Oral Q4H PRN Donell Sievert E, PA-C      . ARIPiprazole (ABILIFY) tablet 20 mg  20 mg Oral QHS Micheal Likens, MD   20 mg at 10/30/17 2050  . ARIPiprazole ER (ABILIFY MAINTENA) injection 400 mg  400 mg Intramuscular Q28 days Micheal Likens, MD   400 mg at 10/24/17 1700  . hydrOXYzine (ATARAX/VISTARIL) tablet 50 mg  50 mg Oral Q6H PRN Micheal Likens, MD      . OLANZapine zydis (ZYPREXA) disintegrating tablet 10 mg  10 mg Oral Q8H PRN Micheal Likens, MD       And  . LORazepam (ATIVAN) tablet 1 mg  1 mg Oral PRN Micheal Likens, MD       And  . ziprasidone (GEODON) injection 20 mg  20 mg Intramuscular PRN Micheal Likens, MD      . magnesium hydroxide (MILK OF MAGNESIA) suspension 30 mL  30 mL Oral Daily PRN Donell Sievert E, PA-C      . sertraline (ZOLOFT) tablet 50 mg  50 mg Oral Daily Micheal Likens, MD   50 mg at 10/31/17 0733  . traZODone (DESYREL) tablet 100 mg  100 mg Oral QHS  PRN,MR X 1 Torri Langston T, MD        Lab Results: No results found for this or any previous visit (from the past 48 hour(s)).  Blood Alcohol level:  Lab Results  Component Value Date   ETH <10 10/18/2017    Metabolic Disorder Labs: No results found for: HGBA1C, MPG No results found for: PROLACTIN No results found for: CHOL, TRIG, HDL, CHOLHDL, VLDL, LDLCALC  Physical Findings: AIMS: Facial and Oral Movements Muscles of Facial Expression: None, normal Lips and Perioral Area: None, normal Jaw: None, normal Tongue: None, normal,Extremity Movements Upper (arms, wrists, hands, fingers): None, normal Lower (legs,  knees, ankles, toes): None, normal, Trunk Movements Neck, shoulders, hips: None, normal, Overall Severity Severity of abnormal movements (highest score from questions above): None, normal Incapacitation due to abnormal movements: None, normal Patient's awareness of abnormal movements (rate only patient's report): No Awareness, Dental Status Current problems with teeth and/or dentures?: No Does patient usually wear dentures?: No  CIWA:  CIWA-Ar Total: 1 COWS:  COWS Total Score: 1  Musculoskeletal: Strength & Muscle Tone: within normal limits Gait & Station: normal Patient leans: N/A  Psychiatric Specialty Exam: Physical Exam  Nursing note and vitals reviewed.   Review of Systems  Constitutional: Negative for chills and fever.  Respiratory: Negative for cough and shortness of breath.   Cardiovascular: Negative for chest pain.  Gastrointestinal: Negative for abdominal pain, heartburn, nausea and vomiting.  Psychiatric/Behavioral: Positive for hallucinations. Negative for depression and suicidal ideas. The patient is nervous/anxious. The patient does not have insomnia.     Blood pressure 120/79, pulse 98, temperature 98.6 F (37 C), temperature source Oral, resp. rate 18, height  (1.6 m), weight 110.2 kg (243 lb), last menstrual period 10/14/2017.Body  mass index is 43.05 kg/m.  General Appearance: Casual and Fairly Groomed  Eye Contact:  Good  Speech:  Clear and Coherent and Normal Rate  Volume:  Normal  Mood:  Euthymic  Affect:  Blunt and Congruent  Thought Process:  Coherent and Goal Directed  Orientation:  Full (Time, Place, and Person)  Thought Content:  Hallucinations: Auditory, Ideas of Reference:   Paranoia and Paranoid Ideation  Suicidal Thoughts:  No  Homicidal Thoughts:  No  Memory:  Immediate;   Fair Recent;   Fair Remote;   Fair  Judgement:  Fair  Insight:  Lacking  Psychomotor Activity:  Normal  Concentration:  Concentration: Fair  Recall:  [employer] of Knowledge:  Fair  Language:  Fair  Akathisia:  No  Handed:    AIMS (if indicated):     Assets:  Desire for Improvement Housing Physical Health Resilience Social Support  ADL's:  Intact  Cognition:  WNL  Sleep:  Number of Hours: 6.75   Treatment Plan Summary: Daily contact with patient to assess and evaluate symptoms and progress in treatment and Medication management   -Continue inpatient hospitalization  -Schizophrenia -Continuezoloft  po qDay -Continue abilify  po qhs -Continueabilify Maintena  IM q28 days (administered on5/6/19)  -Anxiety -Continue atarax  po q6h prn anxiety  -Insomnia -Continue trazodone  po qhs prn insomnia(may repeat x1)  -Agitation -Continue zydis  po q8h prn agitation -Continue ativan  po once prn agiation -Continue geodon  IM once prn agitation  -Encourage participation in groups and therapeutic milieu  -Disposition planning will be ongoing   Micheal Likens, MD 10/31/2017, 2:21 PM

## 2017-10-31 NOTE — Plan of Care (Signed)
Nurse discussed anxiety and coping skills with patient. 

## 2017-10-31 NOTE — Plan of Care (Signed)
D: Pt denies SI/HI/AVH. Pt is pleasant and cooperative. Pt stated she was doing good today. Pt visible on the unit this evening  A: Pt was offered support and encouragement. Pt was given scheduled medications. Pt was encourage to attend groups. Q 15 minute checks were done for safety.   R:Pt attends groups and interacts well with peers and staff. Pt is taking medication. Pt has no complaints.Pt receptive to treatment and safety maintained on unit.   Problem: Education: Goal: Ability to state activities that reduce stress will improve Outcome: Progressing   Problem: Self-Concept: Goal: Level of anxiety will decrease Outcome: Progressing   Problem: Education: Goal: Mental status will improve Outcome: Progressing

## 2017-10-31 NOTE — [facility] Group Notes (Signed)
[facility] LCSW Group Therapy Note  Date/Time: 10/31/17, 1315  Type of Therapy and Topic:  Group Therapy:  Overcoming Obstacles  Participation Level:    Description of Group:    In this group patients will be encouraged to explore what they see as obstacles to their own wellness and recovery. They will be guided to discuss their thoughts, feelings, and behaviors related to these obstacles. The group will process together ways to cope with barriers, with attention given to specific choices patients can make. Each patient will be challenged to identify changes they are motivated to make in order to overcome their obstacles. This group will be process-oriented, with patients participating in exploration of their own experiences as well as giving and receiving support and challenge from other group members.  Therapeutic Goals: 1. Patient will identify personal and current obstacles as they relate to admission. 2. Patient will identify barriers that currently interfere with their wellness or overcoming obstacles.  3. Patient will identify feelings, thought process and behaviors related to these barriers. 4. Patient will identify two changes they are willing to make to overcome these obstacles:    Summary of Patient Progress: Pt shared that "new medication" is her obstacle as she has needed time to adjust to her new meds.  Pt was active throughout group and appropriate and quite engaged in group discussion.      Therapeutic Modalities:   Cognitive Behavioral Therapy Solution Focused Therapy Motivational Interviewing Relapse Prevention Therapy  Daleen Squibb, LCSW

## 2017-11-01 MED ORDER — BENZTROPINE MESYLATE 0.5 MG PO TABS
0.5000 mg | ORAL_TABLET | Freq: Every day | ORAL | Status: DC
  Administered 2017-11-01 – 2017-11-04 (×4): 0.5 mg via ORAL
  Filled 2017-11-01: qty 7
  Filled 2017-11-01 (×6): qty 1

## 2017-11-01 NOTE — [facility] Group Notes (Signed)
[facility] LCSW Group Therapy Note  Date/Time: 11/01/17, 1315  Type of Therapy/Topic:  Group Therapy:  Feelings about Diagnosis  Participation Level:  Active   Mood:pleasant   Description of Group:    This group will allow patients to explore their thoughts and feelings about diagnoses they have received. Patients will be guided to explore their level of understanding and acceptance of these diagnoses. Facilitator will encourage patients to process their thoughts and feelings about the reactions of others to their diagnosis, and will guide patients in identifying ways to discuss their diagnosis with significant others in their lives. This group will be process-oriented, with patients participating in exploration of their own experiences as well as giving and receiving support and challenge from other group members.   Therapeutic Goals: 1. Patient will demonstrate understanding of diagnosis as evidence by identifying two or more symptoms of the disorder:  2. Patient will be able to express two feelings regarding the diagnosis 3. Patient will demonstrate ability to communicate their needs through discussion and/or role plays  Summary of Patient Progress: Pt said she does not know what her diagnosis is.  Pt was active in group discussion about symptoms of bipolar disorder.  Good participation overall.        Therapeutic Modalities:   Cognitive Behavioral Therapy Brief Therapy Feelings Identification   Daleen Squibb, LCSW

## 2017-11-01 NOTE — Progress Notes (Signed)
[facility] MD Progress Note  11/01/2017 3:18 PM Lindsay [name]  MRN:  [id number]  Subjective: [place] reports, "It is going well today, except that I'm having build-up saliva in mouth. I feel like my lips & gums are swollen. I feel like my jaws are strained. Sleep is good. I'm attending group sessions. I'm sleeping well as well".  Lindsay [name] is a 27 y/o F with no known psychiatric history who was admitted voluntarily as a walk-in to [facility] brought in by her mother with complaints of worsening depression, disorganization, paranoia, tangential speech, and thoughts of hurting herself. As per collateral from pt's mother, pt's symptomssignificantly worsened inOctober 2018 when she was living with her sister, but they have had insidious onset for the past few years. Pt became paranoid and was staying outside of the apartment in her car. She was medically cleared at [facility] then returned to [facility] for additional treatment and stabilization.Pt was vague, paranoid, and minimally cooperative with initial interview. She refused to be started on medications. She later had episode of agitation on the unit requiring IM geodon to manage her behaviors. She was placed on IVC due to her agitation, disorganization, and poor insight.Pt was started on trial of zoloft and abilify, and dose of abilifywas titrated up during her stay and she was also transitioned to long-acting injectable form of Abilify Maintenawhich she received on 10/24/17. Pt has been reporting incremental improvement of her presenting symptoms.  Today upon evaluation,pt shares, "It is going well today, except that I'm having build-up saliva in mouth. I feel like my lips & gums are swollen. I feel like my jaws are strained. Sleep is good. I'm attending group sessions. I'm sleeping well".  Her appetite is good. She denies SI/HI/VH. She is tolerating her medications well. SW team is working to establish outpatient follow up, possibly at [facility], and treatment  team has been coordinating with pt's mother regarding discharge plan. Pt's mother expresses concern about pt being at home alone after discharge for significant portions of the day with no one else at home. She says she works extended hours at work & cannot be in 2 places at one time. Pt is scheduled to be discharged on Friday as mother previously raised concerns over patient being at home by herself while she (mother) is at work, working long hours. Patient is agreement with the above plan, and she had no further questions, comments, or concerns. She is started on Cogentin 0.5 mh po for EPS.  Principal Problem: Schizophrenia (HCC)  Diagnosis:   Patient Active Problem List   Diagnosis Date Noted  . Schizophrenia (HCC) [F20.9] 10/18/2017   Total Time spent with patient: 15 minutes  Past Psychiatric History: See H&P  Past Medical History:  Past Medical History:  Diagnosis Date  . Asthma    History reviewed. No pertinent surgical history.  Family History: History reviewed. No pertinent family history.  Family Psychiatric  History: See H&P  Social History:  Social History   Substance and Sexual Activity  Alcohol Use No     Social History   Substance and Sexual Activity  Drug Use No    Social History   Socioeconomic History  . Marital status: Single    Spouse name: Not on file  . Number of children: Not on file  . Years of education: Not on file  . Highest education level: Not on file  Occupational History  . Not on file  Social Needs  . Financial resource strain: Not on  file  . Food insecurity:    Worry: Not on file    Inability: Not on file  . Transportation needs:    Medical: Not on file    Non-medical: Not on file  Tobacco Use  . Smoking status: Never Smoker  . Smokeless tobacco: Never Used  Substance and Sexual Activity  . Alcohol use: No  . Drug use: No  . Sexual activity: Not on file  Lifestyle  . Physical activity:    Days per week: Not on file     Minutes per session: Not on file  . Stress: Not on file  Relationships  . Social connections:    Talks on phone: Not on file    Gets together: Not on file    Attends religious service: Not on file    Active member of club or organization: Not on file    Attends meetings of clubs or organizations: Not on file    Relationship status: Not on file  Other Topics Concern  . Not on file  Social History Narrative  . Not on file   Additional Social History:  Pain Medications: see MAR Prescriptions: see MAR Over the Counter: see MAR History of alcohol / drug use?: Yes Longest period of sobriety (when/how long): 4 months Negative Consequences of Use: Work / Programmer, multimedia, Copywriter, advertising relationships, Financial Name of Substance 1: Marijuana 1 - Age of First Use: unknown 1 - Amount (size/oz): unknown 1 - Frequency: unknown 1 - Duration: unknown 1 - Last Use / Amount: January 2019  Sleep: Good  Appetite:  Good  Current Medications: Current Facility-Administered Medications  Medication Dose Route Frequency Provider Last Rate Last Dose  . acetaminophen (TYLENOL) tablet 650 mg  650 mg Oral Q6H PRN Kerry Hough, PA-C      . alum & mag hydroxide-simeth (MAALOX/MYLANTA) 200-200-20 MG/5ML suspension 30 mL  30 mL Oral Q4H PRN Donell Sievert E, PA-C      . ARIPiprazole (ABILIFY) tablet 20 mg  20 mg Oral QHS Micheal Likens, MD   20 mg at 10/31/17 2103  . ARIPiprazole ER (ABILIFY MAINTENA) injection 400 mg  400 mg Intramuscular Q28 days Micheal Likens, MD   400 mg at 10/24/17 1700  . hydrOXYzine (ATARAX/VISTARIL) tablet 50 mg  50 mg Oral Q6H PRN Micheal Likens, MD      . OLANZapine zydis (ZYPREXA) disintegrating tablet 10 mg  10 mg Oral Q8H PRN Micheal Likens, MD       And  . LORazepam (ATIVAN) tablet 1 mg  1 mg Oral PRN Micheal Likens, MD       And  . ziprasidone (GEODON) injection 20 mg  20 mg Intramuscular PRN Micheal Likens, MD      .  magnesium hydroxide (MILK OF MAGNESIA) suspension 30 mL  30 mL Oral Daily PRN Donell Sievert E, PA-C      . sertraline (ZOLOFT) tablet 50 mg  50 mg Oral Daily Micheal Likens, MD   50 mg at 11/01/17 0735  . traZODone (DESYREL) tablet 100 mg  100 mg Oral QHS PRN,MR X 1 [place], Christopher T, MD       Lab Results: No results found for this or any previous visit (from the past 48 hour(s)).  Blood Alcohol level:  Lab Results  Component Value Date   ETH <10 10/18/2017   Metabolic Disorder Labs: No results found for: HGBA1C, MPG No results found for: PROLACTIN No results found for: CHOL, TRIG, HDL,  CHOLHDL, VLDL, LDLCALC  Physical Findings: AIMS: Facial and Oral Movements Muscles of Facial Expression: None, normal Lips and Perioral Area: None, normal Jaw: None, normal Tongue: None, normal,Extremity Movements Upper (arms, wrists, hands, fingers): None, normal Lower (legs, knees, ankles, toes): None, normal, Trunk Movements Neck, shoulders, hips: None, normal, Overall Severity Severity of abnormal movements (highest score from questions above): None, normal Incapacitation due to abnormal movements: None, normal Patient's awareness of abnormal movements (rate only patient's report): No Awareness, Dental Status Current problems with teeth and/or dentures?: No Does patient usually wear dentures?: No  CIWA:  CIWA-Ar Total: 1 COWS:  COWS Total Score: 2  Musculoskeletal: Strength & Muscle Tone: within normal limits Gait & Station: normal Patient leans: N/A  Psychiatric Specialty Exam: Physical Exam  Nursing note and vitals reviewed.   Review of Systems  Constitutional: Negative for chills and fever.  Respiratory: Negative for cough and shortness of breath.   Cardiovascular: Negative for chest pain.  Gastrointestinal: Negative for abdominal pain, heartburn, nausea and vomiting.  Psychiatric/Behavioral: Positive for hallucinations. Negative for depression and suicidal  ideas. The patient is nervous/anxious. The patient does not have insomnia.     Blood pressure 122/80, pulse 97, temperature 98.5 F (36.9 C), temperature source Oral, resp. rate 18, height  (1.6 m), weight 110.2 kg (243 lb), last menstrual period 10/14/2017.Body mass index is 43.05 kg/m.  General Appearance: Casual and Fairly Groomed  Eye Contact:  Good  Speech:  Clear and Coherent and Normal Rate  Volume:  Normal  Mood:  Euthymic  Affect:  Blunt and Congruent  Thought Process:  Coherent and Goal Directed  Orientation:  Full (Time, Place, and Person)  Thought Content:  Hallucinations: Auditory, Ideas of Reference:   Paranoia and Paranoid Ideation  Suicidal Thoughts:  No  Homicidal Thoughts:  No  Memory:  Immediate;   Fair Recent;   Fair Remote;   Fair  Judgement:  Fair  Insight:  Lacking  Psychomotor Activity:  Normal  Concentration:  Concentration: Fair  Recall:  [employer] of Knowledge:  Fair  Language:  Fair  Akathisia:  No  Handed:    AIMS (if indicated):     Assets:  Desire for Improvement Housing Physical Health Resilience Social Support  ADL's:  Intact  Cognition:  WNL  Sleep:  Number of Hours: 5.75   Treatment Plan Summary: Daily contact with patient to assess and evaluate symptoms and progress in treatment and Medication management   -Continue inpatient hospitalization.  -Will continue today 11/01/2017 plan as below except where it is noted.  -Schizophrenia -Continuezoloft  po qDay -Continue abilify  po qhs -Continueabilify Maintena  IM q28 days (administered on5/6/19).  -EPS.              -Initiated Cogentin 0.5 mg po daily.  -Anxiety -Continue atarax  po q6h prn anxiety  -Insomnia -Continue trazodone  po qhs prn insomnia(may repeat x1)  -Agitation -Continue zydis  po q8h prn agitation -Continue ativan  po once prn  agiation -Continue geodon  IM once prn agitation  -Encourage participation in groups and therapeutic milieu  -Disposition planning will be ongoing  Armandina Stammer, NP, PMHNP, FNP-BC. 11/01/2017, 3:18 PMPatient ID: Lindsay [name], female   DOB: 01/09/1991, 27 y.o.   MRN: [id number]

## 2017-11-01 NOTE — Progress Notes (Signed)
Psychoeducational Group Note  Date:  11/01/2017 Time:  2103  Group Topic/Focus:  Wrap-Up Group:   The focus of this group is to help patients review their daily goal of treatment and discuss progress on daily workbooks.  Participation Level: Did Not Attend  Participation Quality:  Not Applicable  Affect:  Not Applicable  Cognitive:  Not Applicable  Insight:  Not Applicable  Engagement in Group: Not Applicable  Additional Comments:  The patient did not attend group this evening.   Hazle Coca S 11/01/2017, 9:03 PM

## 2017-11-01 NOTE — Progress Notes (Signed)
D:Pt is guarded and forwards little on the unit. When writer asked pt how she slept last night, she responded "I'd rather not say." Pt is out of her room sitting in the dayroom not interacting and watching TV. A:Offered support, encouragement and 15 minute checks.  R:Pt denies si and hi. Safety maintained on the unit.

## 2017-11-01 NOTE — Progress Notes (Signed)
Recreation Therapy Notes  Date: 5.14.19 Time: 0950 Location: 500 [address] Dayroom  Group Topic: Coping Skills  Goal Area(s) Addresses:  Patient will be able to identify positive coping skills. Patients will be able to identify benefits of using coping skills post d/c.  Behavioral Response: Engaged  Intervention: Scissors, glue, magazines, worksheet  Activity: Counsellor.  Patients were given a worksheet that was divided into 5 categories (diversions, social, cognitive, tension releasers and physical).  Patients were to find pictures from the magazines that represent each category that could be used as coping skills.  Education: Pharmacologist, Building control surveyor.   Education Outcome: Acknowledges understanding/In group clarification offered/Needs additional education.   Clinical Observations/Feedback: Pt explained coping skills as "a task you do to help you go through any situation".  Pt was bright and active with activity.  Pt identified her coping skills as heartburn for distractions; painting or showing love as social; doing her hair as cognitive; being home, music, flowers and showers/sauna as tension releasers and exercise and physical.    Caroll Rancher, LRT/CTRS     Caroll Rancher A 11/01/2017 10:54 AM

## 2017-11-01 NOTE — Plan of Care (Signed)
D: Pt denies SI/HI/AVH. Pt is pleasant and cooperative. Pt kept to herself most of the evening, but pt was visible at times on the milieu.   A: Pt was offered support and encouragement. Pt was given scheduled medications. Pt was encourage to attend groups. Q 15 minute checks were done for safety.   R:Pt attends groups and interacts well with peers and staff. Pt is taking medication. Pt has no complaints.Pt receptive to treatment and safety maintained on unit.   Problem: Education: Goal: Ability to state activities that reduce stress will improve Outcome: Progressing   Problem: Self-Concept: Goal: Level of anxiety will decrease Outcome: Progressing   Problem: Education: Goal: Mental status will improve Outcome: Progressing   Problem: Coping: Goal: Ability to demonstrate self-control will improve Outcome: Progressing   Problem: Safety: Goal: Periods of time without injury will increase Outcome: Progressing   Problem: Self-Concept: Goal: Will verbalize positive feelings about self Outcome: Progressing

## 2017-11-02 MED ORDER — TRAZODONE HCL 100 MG PO TABS
100.0000 mg | ORAL_TABLET | Freq: Every day | ORAL | Status: DC
  Administered 2017-11-03: 100 mg via ORAL
  Filled 2017-11-02 (×2): qty 1
  Filled 2017-11-02: qty 7
  Filled 2017-11-02: qty 1

## 2017-11-02 NOTE — Progress Notes (Signed)
Patient has been isolative to room for the majority of the shift.  Patient denies SI, HI and AVH and reports a reduction in psychotic symptoms.  Patient has had no issues of behavioral dsycontrol.  Assess patient for safety, offer medications as prescribed, engage in 1:1 staff talks. Patient able to contract for safety.  Continue to monitor as planned.   

## 2017-11-02 NOTE — Progress Notes (Signed)
Adult Psychoeducational Group Note  Date:  11/02/2017 Time:  8:39 PM  Group Topic/Focus:  Wrap-Up Group:   The focus of this group is to help patients review their daily goal of treatment and discuss progress on daily workbooks.  Participation Level:  Active  Participation Quality:  Appropriate  Affect:  Appropriate  Cognitive:  Appropriate  Insight: Appropriate  Engagement in Group:  Engaged  Modes of Intervention:  Discussion  Additional Comments: The patient expressed that she rates today a 7.The patient also said that her goal was to get healthy and love herself.  Octavio Manns 11/02/2017, 8:39 PM

## 2017-11-02 NOTE — Progress Notes (Signed)
[facility] MD Progress Note  11/02/2017 3:36 PM Lindsay [name]  MRN:  [id number]  Subjective: Lindsay [name] reports, "I'm doing well. But, I do have some questions about my medicines. When is my next shot? Sometimes, I don't sleep well at night. I feel like my strained jaw, my tongue & gums feeling swollen yesterday are feeling a lot better today. Can you call my mother & tell her how I'm doing? She will love to hear from you".  Lindsay [name] is a 27 y/o F with no known psychiatric history who was admitted voluntarily as a walk-in to [facility] brought in by her mother with complaints of worsening depression, disorganization, paranoia, tangential speech, and thoughts of hurting herself. As per collateral from pt's mother, pt's symptomssignificantly worsened inOctober 2018 when she was living with her sister, but they have had insidious onset for the past few years. Pt became paranoid and was staying outside of the apartment in her car. She was medically cleared at [facility] then returned to [facility] for additional treatment and stabilization.Pt was vague, paranoid, and minimally cooperative with initial interview. She refused to be started on medications. She later had episode of agitation on the unit requiring IM geodon to manage her behaviors. She was placed on IVC due to her agitation, disorganization, and poor insight.Pt was started on trial of zoloft and abilify, and dose of abilifywas titrated up during her stay and she was also transitioned to long-acting injectable form of Abilify Maintenawhich she received on 10/24/17. Pt has been reporting incremental improvement of her presenting symptoms.  Today 11-02-17, upon evaluation,pt shares, "I'm doing well. But, I do have some questions about my medicines. When is my next shot? Sometimes, I don't sleep well at night. I feel like my strained jaw, tongue & gums feeling swollen yesterday are feeling a lot better today. The Cogentin is helping. Can you call my mother & tell  her how I'm doing? She will love to hear from you". She denies SI/HI/VH. She is tolerating her medications well. SW team is working to establish outpatient follow up care, possibly at [facility], and treatment team has been coordinating with pt's mother regarding discharge plan. Pt's mother expresses concern about pt being at home alone after discharge for significant portions of the day with no one else at home. She says she works extended hours at work & cannot be in 2 places at one time. Pt is scheduled to be discharged on Friday as mother previously raised concerns over patient being at home by herself while she (mother) is at work, working long hours. Patient is explained today that her mother is on board & aware of her treatment & discharge plans. She was made aware that her mother calls the psychiatrist or the SW if & when ever she has any questions pertaining to her care. Patient is in agreement with the above plan, and she had no further questions, comments, or concerns. She was started on Cogentin 0.5 mg po for EPS yesterday & her Trazodone has been changed from prn to routine..  Principal Problem: Schizophrenia (HCC)  Diagnosis:   Patient Active Problem List   Diagnosis Date Noted  . Schizophrenia (HCC) [F20.9] 10/18/2017   Total Time spent with patient: 15 minutes  Past Psychiatric History: See H&P  Past Medical History:  Past Medical History:  Diagnosis Date  . Asthma    History reviewed. No pertinent surgical history.  Family History: History reviewed. No pertinent family history.  Family Psychiatric  History: See  H&P  Social History:  Social History   Substance and Sexual Activity  Alcohol Use No     Social History   Substance and Sexual Activity  Drug Use No    Social History   Socioeconomic History  . Marital status: Single    Spouse name: Not on file  . Number of children: Not on file  . Years of education: Not on file  . Highest education level: Not on file   Occupational History  . Not on file  Social Needs  . Financial resource strain: Not on file  . Food insecurity:    Worry: Not on file    Inability: Not on file  . Transportation needs:    Medical: Not on file    Non-medical: Not on file  Tobacco Use  . Smoking status: Never Smoker  . Smokeless tobacco: Never Used  Substance and Sexual Activity  . Alcohol use: No  . Drug use: No  . Sexual activity: Not on file  Lifestyle  . Physical activity:    Days per week: Not on file    Minutes per session: Not on file  . Stress: Not on file  Relationships  . Social connections:    Talks on phone: Not on file    Gets together: Not on file    Attends religious service: Not on file    Active member of club or organization: Not on file    Attends meetings of clubs or organizations: Not on file    Relationship status: Not on file  Other Topics Concern  . Not on file  Social History Narrative  . Not on file   Additional Social History:  Pain Medications: see MAR Prescriptions: see MAR Over the Counter: see MAR History of alcohol / drug use?: Yes Longest period of sobriety (when/how long): 4 months Negative Consequences of Use: Work / Programmer, multimedia, Copywriter, advertising relationships, Financial Name of Substance 1: Marijuana 1 - Age of First Use: unknown 1 - Amount (size/oz): unknown 1 - Frequency: unknown 1 - Duration: unknown 1 - Last Use / Amount: January 2019  Sleep: Good  Appetite:  Good  Current Medications: Current Facility-Administered Medications  Medication Dose Route Frequency Provider Last Rate Last Dose  . acetaminophen (TYLENOL) tablet 650 mg  650 mg Oral Q6H PRN Kerry Hough, PA-C      . alum & mag hydroxide-simeth (MAALOX/MYLANTA) 200-200-20 MG/5ML suspension 30 mL  30 mL Oral Q4H PRN Donell Sievert E, PA-C      . ARIPiprazole (ABILIFY) tablet 20 mg  20 mg Oral QHS Micheal Likens, MD   20 mg at 11/01/17 2117  . ARIPiprazole ER (ABILIFY MAINTENA) injection 400 mg   400 mg Intramuscular Q28 days Micheal Likens, MD   400 mg at 10/24/17 1700  . benztropine (COGENTIN) tablet 0.5 mg  0.5 mg Oral Daily Nwoko, Agnes I, NP   0.5 mg at 11/02/17 0732  . hydrOXYzine (ATARAX/VISTARIL) tablet 50 mg  50 mg Oral Q6H PRN Micheal Likens, MD      . OLANZapine zydis (ZYPREXA) disintegrating tablet 10 mg  10 mg Oral Q8H PRN Micheal Likens, MD       And  . LORazepam (ATIVAN) tablet 1 mg  1 mg Oral PRN Micheal Likens, MD       And  . ziprasidone (GEODON) injection 20 mg  20 mg Intramuscular PRN Micheal Likens, MD      . magnesium hydroxide (MILK OF MAGNESIA)  suspension 30 mL  30 mL Oral Daily PRN Donell Sievert E, PA-C      . sertraline (ZOLOFT) tablet 50 mg  50 mg Oral Daily Micheal Likens, MD   50 mg at 11/02/17 0732  . traZODone (DESYREL) tablet 100 mg  100 mg Oral QHS PRN,MR X 1 [place], Christopher T, MD       Lab Results: No results found for this or any previous visit (from the past 48 hour(s)).  Blood Alcohol level:  Lab Results  Component Value Date   ETH <10 10/18/2017   Metabolic Disorder Labs: No results found for: HGBA1C, MPG No results found for: PROLACTIN No results found for: CHOL, TRIG, HDL, CHOLHDL, VLDL, LDLCALC  Physical Findings: AIMS: Facial and Oral Movements Muscles of Facial Expression: None, normal Lips and Perioral Area: None, normal Jaw: None, normal Tongue: None, normal,Extremity Movements Upper (arms, wrists, hands, fingers): None, normal Lower (legs, knees, ankles, toes): None, normal, Trunk Movements Neck, shoulders, hips: None, normal, Overall Severity Severity of abnormal movements (highest score from questions above): None, normal Incapacitation due to abnormal movements: None, normal Patient's awareness of abnormal movements (rate only patient's report): No Awareness, Dental Status Current problems with teeth and/or dentures?: No Does patient usually wear dentures?:  No  CIWA:  CIWA-Ar Total: 1 COWS:  COWS Total Score: 2  Musculoskeletal: Strength & Muscle Tone: within normal limits Gait & Station: normal Patient leans: N/A  Psychiatric Specialty Exam: Physical Exam  Nursing note and vitals reviewed.   Review of Systems  Constitutional: Negative for chills and fever.  Respiratory: Negative for cough and shortness of breath.   Cardiovascular: Negative for chest pain.  Gastrointestinal: Negative for abdominal pain, heartburn, nausea and vomiting.  Psychiatric/Behavioral: Positive for hallucinations. Negative for depression and suicidal ideas. The patient is nervous/anxious. The patient does not have insomnia.     Blood pressure 123/85, pulse 94, temperature 98.7 F (37.1 C), temperature source Oral, resp. rate 18, height  (1.6 m), weight 110.2 kg (243 lb), last menstrual period 10/14/2017.Body mass index is 43.05 kg/m.  General Appearance: Casual and Fairly Groomed  Eye Contact:  Good  Speech:  Clear and Coherent and Normal Rate  Volume:  Normal  Mood:  Euthymic  Affect:  Blunt and Congruent  Thought Process:  Coherent and Goal Directed  Orientation:  Full (Time, Place, and Person)  Thought Content:  Hallucinations: Auditory, Ideas of Reference:   Paranoia and Paranoid Ideation  Suicidal Thoughts:  No  Homicidal Thoughts:  No  Memory:  Immediate;   Fair Recent;   Fair Remote;   Fair  Judgement:  Fair  Insight:  Lacking  Psychomotor Activity:  Normal  Concentration:  Concentration: Fair  Recall:  [employer] of Knowledge:  Fair  Language:  Fair  Akathisia:  No  Handed:    AIMS (if indicated):     Assets:  Desire for Improvement Housing Physical Health Resilience Social Support  ADL's:  Intact  Cognition:  WNL  Sleep:  Number of Hours: 6.75   Treatment Plan Summary: Daily contact with patient to assess and evaluate symptoms and progress in treatment and Medication management   -Continue inpatient  hospitalization.  -Will continue today 11/02/2017 plan as below except where it is noted.  -Schizophrenia -Continuezoloft  po qDay -Continue abilify  po qhs -Continueabilify Maintena  IM q28 days (administered on5/6/19).  -EPS.              -Continue Cogentin 0.5 mg po  daily.  -Anxiety -Continue atarax  po q6h prn anxiety  -Insomnia -Changed trazodone  po from qhs prn to Trazodone 100 mg po Q hs routinely.  -Agitation -Continue zydis  po q8h prn agitation -Continue ativan  po once prn agiation -Continue geodon  IM once prn agitation  -Encourage participation in groups and therapeutic milieu  -Disposition planning will be ongoing  Armandina Stammer, NP, PMHNP, FNP-BC. 11/02/2017, 3:36 PMPatient ID: [name], female   DOB: 05-19-1991, 27 y.o.   MRN: [id number]

## 2017-11-02 NOTE — Plan of Care (Signed)
D: Pt denies SI/HI/AVH. Pt is pleasant and cooperative. Pt stated she was doing good, ready to leave Friday  A: Pt was offered support and encouragement. Pt was given scheduled medications. Pt was encourage to attend groups. Q 15 minute checks were done for safety.   R: safety maintained on unit.   Problem: Safety: Goal: Periods of time without injury will increase Outcome: Progressing   Problem: Coping: Goal: Ability to identify and develop effective coping behavior will improve Outcome: Progressing

## 2017-11-02 NOTE — Progress Notes (Signed)
CSW spoke to pt mother regarding discharge Friday.  Mother had already spoken to [facility] and will be taking pt straight there from [facility] for her follow up appt and to discuss options for ACT team or day program.  Mother had applied for medicaid and does think pt will be approved.  Mother also filled out a disability application this morning.  Mother asking for a plan for pt to do something during the day while her medicaid is pending and CSW told her that I am not aware of options other than discussing this with [facility] on Friday. Garner Nash, MSW, LCSW Clinical Social Worker 11/02/2017 9:59 AM

## 2017-11-02 NOTE — Progress Notes (Signed)
Recreation Therapy Notes  Date: 5.15.19 Time: 1000 Location: 500 Hall Dayroom  Group Topic: Wellness  Goal Area(s) Addresses:  Patient will define components of whole wellness. Patient will verbalize benefit of whole wellness.  Behavioral Response: Engaged  Intervention: Exercise  Activity:  Exercise.  LRT discussed with patients the importance of complete wellness.  LRT then lead patients in a series of stretches to loosen them up.  Each patient was to pick an exercise for the group to complete.  The group completed 2 rounds of exercises.  Education: Wellness, Building control surveyor.   Education Outcome: Acknowledges education/In group clarification offered/Needs additional education.   Clinical Observations/Feedback: Pt fully participated in group.  Pt led group in a number of exercises.  Pt stated she liked exercising.  Pt was laughing and fully active during the group session.     Caroll Rancher, LRT/CTRS     Caroll Rancher A 11/02/2017 12:32 PM

## 2017-11-02 NOTE — [facility] Group Notes (Signed)
  [facility] LCSW Group Therapy Note  Date/Time: 11/02/17, 1315  Type of Therapy/Topic:  Group Therapy:  Emotion Regulation  Participation Level:  Active   Mood:guarded  Description of Group:    The purpose of this group is to assist patients in learning to regulate negative emotions and experience positive emotions. Patients will be guided to discuss ways in which they have been vulnerable to their negative emotions. These vulnerabilities will be juxtaposed with experiences of positive emotions or situations, and patients challenged to use positive emotions to combat negative ones. Special emphasis will be placed on coping with negative emotions in conflict situations, and patients will process healthy conflict resolution skills.  Therapeutic Goals: 1. Patient will identify two positive emotions or experiences to reflect on in order to balance out negative emotions:  2. Patient will label two or more emotions that they find the most difficult to experience:  3. Patient will be able to demonstrate positive conflict resolution skills through discussion or role plays:   Summary of Patient Progress:Pt was attentive in group and made several comments.  Pt declined to share about what emotions are difficult for her and declined to answer several questions from CSW as well.       Therapeutic Modalities:   Cognitive Behavioral Therapy Feelings Identification Dialectical Behavioral Therapy  Daleen Squibb, LCSW

## 2017-11-03 DIAGNOSIS — F329 Major depressive disorder, single episode, unspecified: Secondary | ICD-10-CM

## 2017-11-03 MED ORDER — TRAZODONE HCL 100 MG PO TABS: 100.0000 mg | ORAL_TABLET | Freq: Every day | ORAL | 0 refills | Status: DC

## 2017-11-03 MED ORDER — ARIPIPRAZOLE ER 400 MG IM SRER: 400.0000 mg | INTRAMUSCULAR | 0 refills | Status: DC

## 2017-11-03 MED ORDER — ARIPIPRAZOLE 20 MG PO TABS: 20.0000 mg | ORAL_TABLET | Freq: Every day | ORAL | 0 refills | Status: DC

## 2017-11-03 MED ORDER — BENZTROPINE MESYLATE 0.5 MG PO TABS: 0.5000 mg | ORAL_TABLET | Freq: Every day | ORAL | 0 refills | Status: DC

## 2017-11-03 MED ORDER — SERTRALINE HCL 50 MG PO TABS: 50.0000 mg | ORAL_TABLET | Freq: Every day | ORAL | 0 refills | Status: DC

## 2017-11-03 MED ORDER — HYDROXYZINE HCL 50 MG PO TABS: 50.0000 mg | ORAL_TABLET | Freq: Four times a day (QID) | ORAL | 0 refills | Status: DC | PRN

## 2017-11-03 NOTE — Progress Notes (Signed)
Recreation Therapy Notes  Date: 5.16.19 Time: 1000 Location: 500 Hall Dayroom  Group Topic: Self-Expression  Goal Area(s) Addresses:  Patient will successfully identify positive attributes about themselves.  Patient will successfully identify benefit of improved self-expression.   Behavioral Response: Engaged  Intervention: Markers, colored pencils, construction paper  Activity: Personalized License Plate.  Patients were to design a license plate that described them, what they like and important dates.  Education:  Self-Esteem, Building control surveyor.   Education Outcome: Acknowledges education/In group clarification offered/Needs additional education  Clinical Observations/Feedback: Pt was bright and active during group.  Pt expressed her nickname was "Sunshine", she was born on 1990-12-09, is from [place] [facility] [place], graduated from [employer] in 2014 and from [employer] in 2010.  Pt expressed the activity wasn't hard because "I know who I am".  Pt also stated she is cautious of what she says to other people because she doesn't want to offend anyone and the she tries to turn negative opinions of her into positives.    Caroll Rancher, LRT/CTRS     Lillia Abed, Taliya Mcclard A 11/03/2017 1:15 PM

## 2017-11-03 NOTE — Progress Notes (Signed)
Adult Psychoeducational Group Note  Date:  11/03/2017 Time:  9:10 PM  Group Topic/Focus:  Wrap-Up Group:   The focus of this group is to help patients review their daily goal of treatment and discuss progress on daily workbooks.  Participation Level:  Active  Participation Quality:  Appropriate  Affect:  Appropriate  Cognitive:  Appropriate  Insight: Appropriate  Engagement in Group:  Improving  Modes of Intervention:  Discussion  Additional Comments: The patient expressed that she attended all groups    Octavio Manns 11/03/2017, 9:10 PM

## 2017-11-03 NOTE — Progress Notes (Signed)
Patient has been active on the unit attending groups and engaged in unit activities.   Patient was noted to be bright and pleasant upon approach.  Patient denies SI, HI and AVH.   Assess patient for safety, offer medications as prescribed, engage patient in 1:1 staff talks.   Patient able to contract for safety. Continue to monitor as planned.

## 2017-11-03 NOTE — BHH Group Notes (Signed)
LCSW Group Therapy Note   11/03/2017 1:15pm   Type of Therapy and Topic:  Group Therapy:  Positive Affirmations   Participation Level:  Minimal  Description of Group: This group addressed positive affirmation toward self and others. Patients went around the room and identified two positive things about themselves and two positive things about a peer in the room. Patients reflected on how it felt to share something positive with others, to identify positive things about themselves, and to hear positive things from others. Patients were encouraged to have a daily reflection of positive characteristics or circumstances.  Therapeutic Goals 1. Patient will verbalize two of their positive qualities 2. Patient will demonstrate empathy for others by stating two positive qualities about a peer in the group 3. Patient will verbalize their feelings when voicing positive self affirmations and when voicing positive affirmations of others 4. Patients will discuss the potential positive impact on their wellness/recovery of focusing on positive traits of self and others. Summary of Patient Progress:  In and out a couple of times, but stayed for about 15 minutes at one point.  At first, stated she was not going to contribute, but then did.  "I trust God will provide, and everything will work out,  because I stand with the word of God.  Therapeutic Modalities Cognitive Behavioral Therapy Motivational Interviewing  Ida Rogue, [place] 11/03/2017 3:11 PM
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
## 2017-11-03 NOTE — Discharge Summary (Addendum)
Physician Discharge Summary Note  Patient:  Lindsay [name] is an 27 y.o., female  MRN:  [id number]  DOB:  [birth year]  Patient phone:  [phone] (home)   Patient address:   [address] [place] [address],   Total Time spent with patient: Greater than 30 minutes  Date of Admission:  10/18/2017  Date of Discharge: 11-04-17  Reason for Admission: Worsening depression, disorganization, paranoia, tangential speech, and thoughts of hurting herself.  Principal Problem: Schizophrenia [facility])  Discharge Diagnoses: Patient Active Problem List   Diagnosis Date Noted  . Schizophrenia (HCC) [F20.9] 10/18/2017   Past Psychiatric History: Hx. Schizophrenia  Past Medical History:  Past Medical History:  Diagnosis Date  . Asthma    History reviewed. No pertinent surgical history.  Family History: History reviewed. No pertinent family history.  Family Psychiatric  History: See H&P  Social History:  Social History   Substance and Sexual Activity  Alcohol Use No     Social History   Substance and Sexual Activity  Drug Use No    Social History   Socioeconomic History  . Marital status: Single    Spouse name: Not on file  . Number of children: Not on file  . Years of education: Not on file  . Highest education level: Not on file  Occupational History  . Not on file  Social Needs  . Financial resource strain: Not on file  . Food insecurity:    Worry: Not on file    Inability: Not on file  . Transportation needs:    Medical: Not on file    Non-medical: Not on file  Tobacco Use  . Smoking status: Never Smoker  . Smokeless tobacco: Never Used  Substance and Sexual Activity  . Alcohol use: No  . Drug use: No  . Sexual activity: Not on file  Lifestyle  . Physical activity:    Days per week: Not on file    Minutes per session: Not on file  . Stress: Not on file  Relationships  . Social connections:    Talks on phone: Not on file    Gets together: Not on file     Attends religious service: Not on file    Active member of club or organization: Not on file    Attends meetings of clubs or organizations: Not on file    Relationship status: Not on file  Other Topics Concern  . Not on file  Social History Narrative  . Not on file   Hospital Course: (Per Md's discharge SRA): Maybel Dambrosio is a 27 y/o F with no known psychiatric history who was admitted voluntarily as a walk-in to [facility] brought in by her mother with complaints of worsening depression, disorganization, paranoia, tangential speech, and thoughts of hurting herself. As per collateral from pt's mother, pt's symptomssignificantly worsened inOctober 2018 when she was living with her sister, but they have had insidious onset for the past few years. Pt became paranoid and was staying outside of the apartment in her car. She was medically cleared at [facility] then returned to [facility] for additional treatment and stabilization.Pt was vague, paranoid, and minimally cooperative with initial interview. She refused to be started on medications. She later had episode of agitation on the unit requiring IM geodon to manage her behaviors. She was placed on IVC due to her agitation, disorganization, and poor insight.Pt was started on trial of zoloft and abilify, and dose of abilifywas titrated up during her stay and she  was also transitioned to long-acting injectable form of Abilify Maintenawhich she received on 10/24/17. Pt has been reporting incremental improvement of her presenting symptoms.  Today upon evaluation,pt shares, "I'm making it." Pt reports she is doing well overall. She has no physical complaints. She is sleeping well. Her appetite is good. She denies SI/HI/AH/VH. She reports some ongoing mild paranoia, but she reports it is not bothersome. She is tolerating her medications well, and she shares, "I've been adapting to my medications." She is in agreement to continue her current treatment regimen without  changes. She plans to follow up at [facility] after discharge. She plans to stay with her mother after discharge. She shares that she would like to seek employment and be "more involved in the community" after discharge as well. She was able to engage in safety planning including plan to return to [facility] or contact emergency services if she feels unable to maintain her own safety or the safety of others. Pt had no further questions, comments, or concerns.  Physical Findings: AIMS: Facial and Oral Movements Muscles of Facial Expression: None, normal Lips and Perioral Area: None, normal Jaw: None, normal Tongue: None, normal,Extremity Movements Upper (arms, wrists, hands, fingers): None, normal Lower (legs, knees, ankles, toes): None, normal, Trunk Movements Neck, shoulders, hips: None, normal, Overall Severity Severity of abnormal movements (highest score from questions above): None, normal Incapacitation due to abnormal movements: None, normal Patient's awareness of abnormal movements (rate only patient's report): No Awareness, Dental Status Current problems with teeth and/or dentures?: No Does patient usually wear dentures?: No  CIWA:  CIWA-Ar Total: 1 COWS:  COWS Total Score: 2  Musculoskeletal: Strength & Muscle Tone: within normal limits Gait & Station: normal Patient leans: N/A  Psychiatric Specialty Exam: Physical Exam  Constitutional: She appears well-developed.  HENT:  Head: Normocephalic.  Eyes: Pupils are equal, round, and reactive to light.  Neck: Normal range of motion.  Cardiovascular: Normal rate.  Respiratory: Effort normal.  GI: Soft.  Genitourinary:  Genitourinary Comments: Deferred  Musculoskeletal: Normal range of motion.  Neurological: She is alert.  Skin: Skin is warm.    Review of Systems  Constitutional: Negative.   HENT: Negative.   Eyes: Negative.   Respiratory: Negative.   Cardiovascular: Negative.  Negative for chest pain and palpitations.   Gastrointestinal: Negative.  Negative for nausea and vomiting.  Genitourinary: Negative.   Musculoskeletal: Negative.   Skin: Negative.   Neurological: Negative.  Negative for dizziness, tremors and headaches.  Endo/Heme/Allergies: Negative.   Psychiatric/Behavioral: Positive for depression (Stable) and hallucinations (Hx psychosis (Stable)). Negative for memory loss, substance abuse and suicidal ideas. The patient has insomnia (Stable). The patient is not nervous/anxious.     Blood pressure (!) 134/97, pulse (!) 102, temperature 98.6 F (37 C), temperature source Oral, resp. rate 20, height  (1.6 m), weight 110.2 kg (243 lb), last menstrual period 10/14/2017.Body mass index is 43.05 kg/m.  See Md's SRA   Have you used any form of tobacco in the last 30 days? (Cigarettes, Smokeless Tobacco, Cigars, and/or Pipes): No  Has this patient used any form of tobacco in the last 30 days? (Cigarettes, Smokeless Tobacco, Cigars, and/or Pipes): N/A  Blood Alcohol level:  Lab Results  Component Value Date   ETH <10 10/18/2017   Metabolic Disorder Labs:  No results found for: HGBA1C, MPG No results found for: PROLACTIN No results found for: CHOL, TRIG, HDL, CHOLHDL, VLDL, LDLCALC  See Psychiatric Specialty Exam and Suicide Risk Assessment completed by  Attending Physician prior to discharge.  Discharge destination:  Home  Is patient on multiple antipsychotic therapies at discharge:  No   Has Patient had three or more failed trials of antipsychotic monotherapy by history:  No  Recommended Plan for Multiple Antipsychotic Therapies: NA  Allergies as of 11/03/2017   No Known Allergies     Medication List    STOP taking these medications   acetaminophen 500 MG tablet Commonly known as:  TYLENOL   diphenhydrAMINE 25 MG tablet Commonly known as:  BENADRYL   PRENATAL PO     TAKE these medications     Indication  ARIPiprazole 20 MG tablet Commonly known as:  ABILIFY Take 1  tablet (20 mg total) by mouth at bedtime. For mood control  Indication:  Mood control   ARIPiprazole ER 400 MG Srer injection Commonly known as:  ABILIFY MAINTENA Inject 2 mLs (400 mg total) into the muscle every 28 (twenty-eight) days. (Due on 11-21-17): For mood control Start taking on:  11/21/2017  Indication:  Mood control   benztropine 0.5 MG tablet Commonly known as:  COGENTIN Take 1 tablet (0.5 mg total) by mouth daily. For prevention of drug induced tremors Start taking on:  11/04/2017  Indication:  Extrapyramidal Reaction caused by Medications   hydrOXYzine 50 MG tablet Commonly known as:  ATARAX/VISTARIL Take 1 tablet (50 mg total) by mouth every 6 (six) hours as needed (mild/moderate anxiety).  Indication:  Feeling Anxious   sertraline 50 MG tablet Commonly known as:  ZOLOFT Take 1 tablet (50 mg total) by mouth daily. For depression Start taking on:  11/04/2017  Indication:  Major Depressive Disorder   traZODone 100 MG tablet Commonly known as:  DESYREL Take 1 tablet (100 mg total) by mouth at bedtime. For sleep  Indication:  Trouble Sleeping      Follow-up Information    Services, [facility]. Go on 11/04/2017.   Why:  Please attend your intake appt on Friday, 11/04/17, at 11:00am or as soon as possible on that date.  Please bring photo ID, social security card, insurance card, and proof of household income. Contact information: [address] [place] [address] [phone]          Follow-up recommendations: Activity:  As tolerated Diet: As recommended by your primary care doctor. Keep all scheduled follow-up appointments as recommended.  Comments: Patient is instructed prior to discharge to: Take all medications as prescribed by his/her mental healthcare provider. Report any adverse effects and or reactions from the medicines to his/her outpatient provider promptly. Patient has been instructed & cautioned: To not engage in alcohol and or illegal drug use  while on prescription medicines. In the event of worsening symptoms, patient is instructed to call the crisis hotline, 911 and or go to the nearest ED for appropriate evaluation and treatment of symptoms. To follow-up with his/her primary care provider for your other medical issues, concerns and or health care needs.   Signed: Armandina Stammer, NP, PMHNP, FNP-BC 11/03/2017, 10:25 AM   Patient seen, Suicide Assessment Completed.  Disposition Plan Reviewed

## 2017-11-04 NOTE — Progress Notes (Signed)
Patient ID: Lindsay [name], female   DOB: 1991/02/27, 27 y.o.   MRN: [id number] Patient discharged to home/self care in the company of her mother.  Patient acknowledged understanding of all discharge instructions and receipt of personal belongings.  Upon discharge patient was bright and eager to go home.  Patient has a plan of continuing services within the community.

## 2017-11-04 NOTE — Progress Notes (Signed)
Recreation Therapy Notes  Date: 5.17.19 Time: 1000 Location: 500 Hall Dayroom   Group Topic: Communication, Team Building, Problem Solving  Goal Area(s) Addresses:  Patient will effectively work with peer towards shared goal.  Patient will identify skill used to make activity successful.  Patient will identify how skills used during activity can be used to reach post d/c goals.   Behavioral Response: Engaged  Intervention: STEM Activity   Activity: [employer]. Patients were provided the following materials: 5 drinking straws, 5 rubber bands, 5 paper clips, 2 index cards, 2 drinking cups, and 2 toilet paper rolls. Using the provided materials patients were asked to build a launching mechanisms to launch a ping pong ball approximately 12 feet. Patients were divided into teams of 3-5.   Education: Pharmacist, community, Building control surveyor.   Education Outcome: Acknowledges education/In group clarification offered/Needs additional education.   Clinical Observations/Feedback:  Pt worked well with her peers.  Pt helped peers come up with a plan a develop a launcher.  Pt left early for discharge.    Caroll Rancher, LRT/CTRS     Lillia Abed, Jode Lippe A 11/04/2017 11:01 AM

## 2017-11-04 NOTE — Plan of Care (Signed)
Pt was able identify positive coping skills at completion of recreation therapy sessions.   Caroll Rancher, LRT/CTRS

## 2017-11-04 NOTE — Progress Notes (Signed)
Recreation Therapy Notes  INPATIENT RECREATION TR PLAN  Patient Details Name: SHELLEE STRENG MRN: [id number] DOB: [birth year] Today's Date: 11/04/2017  Rec Therapy Plan Is patient appropriate for Therapeutic Recreation?: Yes Treatment times per week: about 3 days Estimated Length of Stay: 5-7 days TR Treatment/Interventions: Group participation (Comment)  Discharge Criteria Pt will be discharged from therapy if:: Discharged Treatment plan/goals/alternatives discussed and agreed upon by:: Patient/family  Discharge Summary Short term goals set: See care plan Short term goals met: Complete Progress toward goals comments: Groups attended Which groups?: Self-esteem, Wellness, Stress management, Communication, Goal setting, Coping skills, Anger management, Other (Comment)(Self expression, Music Therapy, Support System) Reason goals not met: None Therapeutic equipment acquired: N/A Reason patient discharged from therapy: Discharge from hospital Pt/family agrees with progress & goals achieved: Yes Date patient discharged from therapy: 11/04/17    Victorino Sparrow, LRT/CTRS  Ria Comment, Odell 11/04/2017, 11:00 AM

## 2017-11-04 NOTE — Progress Notes (Signed)
  [facility] Adult Case Management Discharge Plan :  Will you be returning to the same living situation after discharge:  Yes,  with mother At discharge, do you have transportation home?: Yes,  mother Do you have the ability to pay for your medications: No. Will work with [facility] [facility]  Release of information consent forms completed and in the chart;  Patient's signature needed at discharge.  Patient to Follow up at: Follow-up Information    Services, [facility]. Go on 11/04/2017.   Why:  Please attend your intake appt on Friday, 11/04/17, at 11:00am or as soon as possible on that date.  Please bring photo ID, social security card, insurance card, and proof of household income. Contact information: [address] [place] [address] [phone]           Next level of care provider has access to [facility] Link:no  Safety Planning and Suicide Prevention discussed: No.  Have you used any form of tobacco in the last 30 days? (Cigarettes, Smokeless Tobacco, Cigars, and/or Pipes): No  Has patient been referred to the Quitline?: N/A patient is not a smoker  Patient has been referred for addiction treatment: N/A  Lorri Frederick, LCSW 11/04/2017, 9:04 AM

## 2017-11-04 NOTE — Plan of Care (Signed)
D: Pt denies SI/HI/AV hallucinations. Pt is excited about discharge. Patient family in to visit. Patient goal was to  work on discharge. A: Pt was offered support and encouragement. Pt was given scheduled medications. Pt was encourage to attend groups. Q 15 minute checks were done for safety.  R:Pt attends groups and interacts well with peers and staff. Pt is taking medication. Pt has no complaints.Pt receptive to treatment and safety maintained on unit.    Problem: Safety: Goal: Periods of time without injury will increase Outcome: Progressing Note:  Patient denies SI and remains safe on unit   Problem: Medication: Goal: Compliance with prescribed medication regimen will improve Outcome: Progressing Note:  Patient is compliant with medication regimen   Problem: Self-Concept: Goal: Ability to disclose and discuss suicidal ideas will improve Outcome: Progressing Note:  Patient denies SI thoughts

## 2018-07-03 ENCOUNTER — Encounter (HOSPITAL_COMMUNITY): Payer: Self-pay

## 2018-07-03 ENCOUNTER — Inpatient Hospital Stay (HOSPITAL_COMMUNITY)
Admission: AD | Admit: 2018-07-03 | Discharge: 2018-07-03 | Disposition: A | Discharge status: Home / Self Care | Payer: BLUE CROSS/BLUE SHIELD | Attending: Obstetrics and Gynecology | Admitting: Obstetrics and Gynecology

## 2018-07-03 ENCOUNTER — Inpatient Hospital Stay (HOSPITAL_COMMUNITY): Payer: BLUE CROSS/BLUE SHIELD

## 2018-07-03 DIAGNOSIS — Z3A01 Less than 8 weeks gestation of pregnancy: Secondary | ICD-10-CM | POA: Diagnosis not present

## 2018-07-03 DIAGNOSIS — Z3491 Encounter for supervision of normal pregnancy, unspecified, first trimester: Secondary | ICD-10-CM

## 2018-07-03 DIAGNOSIS — R51 Headache: Secondary | ICD-10-CM

## 2018-07-03 DIAGNOSIS — O210 Mild hyperemesis gravidarum: Secondary | ICD-10-CM | POA: Insufficient documentation

## 2018-07-03 DIAGNOSIS — O26891 Other specified pregnancy related conditions, first trimester: Secondary | ICD-10-CM | POA: Insufficient documentation

## 2018-07-03 DIAGNOSIS — R519 Headache, unspecified: Secondary | ICD-10-CM

## 2018-07-03 DIAGNOSIS — O219 Vomiting of pregnancy, unspecified: Secondary | ICD-10-CM

## 2018-07-03 DIAGNOSIS — O209 Hemorrhage in early pregnancy, unspecified: Secondary | ICD-10-CM | POA: Diagnosis not present

## 2018-07-03 HISTORY — DX: Schizophrenia, unspecified: F20.9

## 2018-07-03 LAB — WET PREP, GENITAL
Clue Cells Wet Prep HPF POC: NONE SEEN
Sperm: NONE SEEN
Trich, Wet Prep: NONE SEEN
Yeast Wet Prep HPF POC: NONE SEEN

## 2018-07-03 LAB — URINALYSIS, ROUTINE W REFLEX MICROSCOPIC
Bilirubin Urine: NEGATIVE
Glucose, UA: NEGATIVE mg/dL
Hgb urine dipstick: NEGATIVE
Ketones, ur: 20 mg/dL — AB
Nitrite: NEGATIVE
Protein, ur: NEGATIVE mg/dL
Specific Gravity, Urine: 1.018 (ref 1.005–1.030)
pH: 6 (ref 5.0–8.0)

## 2018-07-03 LAB — ABO/RH: ABO/RH(D): A POS

## 2018-07-03 LAB — CBC
HCT: 39.6 % (ref 36.0–46.0)
Hemoglobin: 12.3 g/dL (ref 12.0–15.0)
MCH: 21.6 pg — ABNORMAL LOW (ref 26.0–34.0)
MCHC: 31.1 g/dL (ref 30.0–36.0)
MCV: 69.6 fL — ABNORMAL LOW (ref 80.0–100.0)
Platelets: 255 10*3/uL (ref 150–400)
RBC: 5.69 MIL/uL — ABNORMAL HIGH (ref 3.87–5.11)
RDW: 15.3 % (ref 11.5–15.5)
WBC: 10.7 10*3/uL — ABNORMAL HIGH (ref 4.0–10.5)
nRBC: 0 % (ref 0.0–0.2)

## 2018-07-03 LAB — HCG, QUANTITATIVE, PREGNANCY: hCG, Beta Chain, Quant, S: 53146 m[IU]/mL — ABNORMAL HIGH (ref <5)

## 2018-07-03 LAB — POCT PREGNANCY, URINE: Preg Test, Ur: POSITIVE — AB

## 2018-07-03 MED ORDER — METOCLOPRAMIDE HCL 10 MG PO TABS
10.0000 mg | ORAL_TABLET | Freq: Once | ORAL | Status: AC
  Administered 2018-07-03: 10 mg via ORAL
  Filled 2018-07-03: qty 1

## 2018-07-03 MED ORDER — METOCLOPRAMIDE HCL 10 MG PO TABS: 10.0000 mg | ORAL_TABLET | Freq: Three times a day (TID) | ORAL | 0 refills | Status: DC | PRN

## 2018-07-03 MED ORDER — ACETAMINOPHEN 500 MG PO TABS
1000.0000 mg | ORAL_TABLET | Freq: Once | ORAL | Status: AC
  Administered 2018-07-03: 1000 mg via ORAL
  Filled 2018-07-03: qty 2

## 2018-07-03 NOTE — MAU Provider Note (Signed)
Chief Complaint: Nausea; Headache; and Vaginal Bleeding   First Provider Initiated Contact with Patient 07/03/18 1831     SUBJECTIVE HPI: Lindsay [name] is a 28 y.o. G1P0 at [redacted]w[redacted]d by LMP who presents to [facility] reporting headache, nausea, and vaginal bleeding. Symptoms started today.  Reports frontal headache. Hasn't taken anything for her headache. Nothing makes pain better or worse.  Nausea started today. Has not vomited. Does not have antiemetic at home. Denies fever/chills, heartburn, diarrhea, or constipation.  Has had bright red spotting on toilet paper and in underwear. Not saturating pads or passing clots. Last had intercourse 2 nights ago. Denies abdominal pain, dysuria, or vaginal discharge.   Location: frontal headache Quality: aching Severity: 7/10 on pain scale Duration: 1 day Timing: constant Modifying factors: none Associated signs and symptoms: nausea and vaginal bleeding  Past Medical History:  Diagnosis Date  . Asthma   . Schizophrenia (HCC)    OB History  Gravida Para Term Preterm AB Living  1            SAB TAB Ectopic Multiple Live Births               # Outcome Date GA Lbr Len/2nd Weight Sex Delivery Anes PTL Lv  1 Current            Past Surgical History:  Procedure Laterality Date  . TONSILLECTOMY     Social History   Socioeconomic History  . Marital status: Single    Spouse name: Not on file  . Number of children: Not on file  . Years of education: Not on file  . Highest education level: Not on file  Occupational History  . Not on file  Social Needs  . Financial resource strain: Not on file  . Food insecurity:    Worry: Not on file    Inability: Not on file  . Transportation needs:    Medical: Not on file    Non-medical: Not on file  Tobacco Use  . Smoking status: Never Smoker  . Smokeless tobacco: Never Used  Substance and Sexual Activity  . Alcohol use: No  . Drug use: No  . Sexual activity: Yes  Lifestyle  .  Physical activity:    Days per week: Not on file    Minutes per session: Not on file  . Stress: Not on file  Relationships  . Social connections:    Talks on phone: Not on file    Gets together: Not on file    Attends religious service: Not on file    Active member of club or organization: Not on file    Attends meetings of clubs or organizations: Not on file    Relationship status: Not on file  . Intimate partner violence:    Fear of current or ex partner: Not on file    Emotionally abused: Not on file    Physically abused: Not on file    Forced sexual activity: Not on file  Other Topics Concern  . Not on file  Social History Narrative  . Not on file   Family History  Problem Relation Age of Onset  . Cancer Father        liver   No current facility-administered medications on file prior to encounter.    Current Outpatient Medications on File Prior to Encounter  Medication Sig Dispense Refill  . ARIPiprazole (ABILIFY) 20 MG tablet Take 1 tablet (20 mg total) by mouth at bedtime. For mood control  15 tablet 0  . ARIPiprazole ER (ABILIFY MAINTENA) 400 MG SRER injection Inject 2 mLs (400 mg total) into the muscle every 28 (twenty-eight) days. (Due on 11-21-17): For mood control 1 each 0  . benztropine (COGENTIN) 0.5 MG tablet Take 1 tablet (0.5 mg total) by mouth daily. For prevention of drug induced tremors 30 tablet 0  . hydrOXYzine (ATARAX/VISTARIL) 50 MG tablet Take 1 tablet (50 mg total) by mouth every 6 (six) hours as needed (mild/moderate anxiety). 75 tablet 0  . sertraline (ZOLOFT) 50 MG tablet Take 1 tablet (50 mg total) by mouth daily. For depression 30 tablet 0  . traZODone (DESYREL) 100 MG tablet Take 1 tablet (100 mg total) by mouth at bedtime. For sleep 30 tablet 0   No Known Allergies  I have reviewed patient's Past Medical Hx, Surgical Hx, Family Hx, Social Hx, medications and allergies.   Review of Systems  Constitutional: Negative.   Gastrointestinal:  Positive for nausea. Negative for abdominal pain, constipation, diarrhea and vomiting.  Genitourinary: Positive for vaginal bleeding. Negative for dysuria and vaginal discharge.  Neurological: Positive for headaches.    OBJECTIVE Patient Vitals for the past 24 hrs:  BP Temp Temp src Pulse Resp SpO2 Height Weight  07/03/18 1808 (!) 135/57 98.8 F (37.1 C) Oral 67 18 100 % 5\' 3"  (1.6 m) 132.9 kg   Constitutional: Well-developed, well-nourished female in no acute distress.  Cardiovascular: normal rate & rhythm, no murmur Respiratory: normal rate and effort. Lung sounds clear throughout GI: Abd soft, non-tender, Pos BS x 4. No guarding or rebound tenderness MS: Extremities nontender, no edema, normal ROM Neurologic: Alert and oriented x 4.  GU:     SPECULUM EXAM: NEFG, physiologic discharge, no blood noted, cervix clean  BIMANUAL: No CMT. cervix closed; uterus normal size, no adnexal tenderness or masses.    LAB RESULTS Results for orders placed or performed during the hospital encounter of 07/03/18 (from the past 24 hour(s))  Urinalysis, Routine w reflex microscopic     Status: Abnormal   Collection Time: 07/03/18  6:28 PM  Result Value Ref Range   Color, Urine YELLOW YELLOW   APPearance HAZY (A) CLEAR   Specific Gravity, Urine 1.018 1.005 - 1.030   pH 6.0 5.0 - 8.0   Glucose, UA NEGATIVE NEGATIVE mg/dL   Hgb urine dipstick NEGATIVE NEGATIVE   Bilirubin Urine NEGATIVE NEGATIVE   Ketones, ur 20 (A) NEGATIVE mg/dL   Protein, ur NEGATIVE NEGATIVE mg/dL   Nitrite NEGATIVE NEGATIVE   Leukocytes, UA TRACE (A) NEGATIVE   RBC / HPF 0-5 0 - 5 RBC/hpf   WBC, UA 6-10 0 - 5 WBC/hpf   Bacteria, UA RARE (A) NONE SEEN   Squamous Epithelial / LPF 0-5 0 - 5  Pregnancy, urine POC     Status: Abnormal   Collection Time: 07/03/18  6:31 PM  Result Value Ref Range   Preg Test, Ur POSITIVE (A) NEGATIVE  Wet prep, genital     Status: Abnormal   Collection Time: 07/03/18  6:42 PM  Result Value Ref  Range   Yeast Wet Prep HPF POC NONE SEEN NONE SEEN   Trich, Wet Prep NONE SEEN NONE SEEN   Clue Cells Wet Prep HPF POC NONE SEEN NONE SEEN   WBC, Wet Prep HPF POC MODERATE (A) NONE SEEN   Sperm NONE SEEN   CBC     Status: Abnormal   Collection Time: 07/03/18  7:06 PM  Result Value Ref Range  WBC 10.7 (H) 4.0 - 10.5 K/uL   RBC 5.69 (H) 3.87 - 5.11 MIL/uL   Hemoglobin 12.3 12.0 - 15.0 g/dL   HCT 74.8 27.0 - 78.6 %   MCV 69.6 (L) 80.0 - 100.0 fL   MCH 21.6 (L) 26.0 - 34.0 pg   MCHC 31.1 30.0 - 36.0 g/dL   RDW 75.4 49.2 - 01.0 %   Platelets 255 150 - 400 K/uL   nRBC 0.0 0.0 - 0.2 %  ABO/Rh     Status: None (Preliminary result)   Collection Time: 07/03/18  7:06 PM  Result Value Ref Range   ABO/RH(D)      A POS Performed at [facility], [address], [place], [place] [address]   hCG, quantitative, pregnancy     Status: Abnormal   Collection Time: 07/03/18  7:06 PM  Result Value Ref Range   hCG, Beta Chain, Quant, S 53,146 (H) <5 mIU/mL    IMAGING US Ob Less Than 14 Weeks With Ob Transvaginal  Result Date: 07/03/2018 CLINICAL DATA:  Vaginal bleeding in first trimester of pregnancy EXAM: OBSTETRIC <14 WK [place] AND TRANSVAGINAL OB US TECHNIQUE: Both transabdominal and transvaginal ultrasound examinations were performed for complete evaluation of the gestation as well as the maternal uterus, adnexal regions, and pelvic cul-de-sac. Transvaginal technique was performed to assess early pregnancy. COMPARISON:  None FINDINGS: Intrauterine gestational sac: Present, single Yolk sac:  Present Embryo:  Present Cardiac Activity: Present Heart Rate: 155 bpm CRL:  12 mm   7 w   3 d                  [place] EDC: 02/16/2019 Subchorionic hemorrhage:  None visualized. Maternal uterus/adnexae: RIGHT ovary normal size and morphology, 3.5 x 3.7 x 2.6 cm. LEFT ovary normal size and morphology, 1.8 x 2.7 x 1.9 cm. No free pelvic fluid or adnexal masses IMPRESSION: Single live intrauterine gestation at 7 weeks 3  days EGA by crown-rump length. No acute abnormalities. Electronically Signed   By: Ulyses Southward M.D.   On: 07/03/2018 20:25    MAU COURSE Orders Placed This Encounter  Procedures  . Wet prep, genital  . US OB LESS THAN 14 WEEKS WITH OB TRANSVAGINAL  . Urinalysis, Routine w reflex microscopic  . CBC  . hCG, quantitative, pregnancy  . Pregnancy, urine POC  . ABO/Rh  . Discharge patient   Meds ordered this encounter  Medications  . acetaminophen (TYLENOL) tablet 1,000 mg  . metoCLOPramide (REGLAN) tablet 10 mg  . metoCLOPramide (REGLAN) 10 MG tablet    Sig: Take 1 tablet (10 mg total) by mouth every 8 (eight) hours as needed.    Dispense:  30 tablet    Refill:  0    Order Specific Question:   Supervising Provider    Answer:   ERVIN, MICHAEL L [[id number]]    MDM +UPT UA, wet prep, GC/chlamydia, CBC, ABO/Rh, quant hCG, and [place] today to rule out ectopic pregnancy  Tylenol & reglan given for headache & nausea -- pt reports moderate improvement in symptoms after meds. Will send home with rx for reglan  Ultrasound shows live IUP. Will give list of ob providers and pregnancy verification letter.   ASSESSMENT 1. Normal IUP (intrauterine pregnancy) on prenatal ultrasound, first trimester   2. Vaginal bleeding in pregnancy, first trimester   3. Nausea and vomiting during pregnancy prior to [redacted] weeks gestation   4. Headache in pregnancy, antepartum, first trimester     PLAN  Discharge home in stable condition. Bleeding precautions  Allergies as of 07/03/2018   No Known Allergies     Medication List    TAKE these medications   ARIPiprazole 20 MG tablet Commonly known as:  ABILIFY Take 1 tablet (20 mg total) by mouth at bedtime. For mood control   ARIPiprazole ER 400 MG Srer injection Commonly known as:  ABILIFY MAINTENA Inject 2 mLs (400 mg total) into the muscle every 28 (twenty-eight) days. (Due on 11-21-17): For mood control   benztropine 0.5 MG tablet Commonly known as:   COGENTIN Take 1 tablet (0.5 mg total) by mouth daily. For prevention of drug induced tremors   hydrOXYzine 50 MG tablet Commonly known as:  ATARAX/VISTARIL Take 1 tablet (50 mg total) by mouth every 6 (six) hours as needed (mild/moderate anxiety).   metoCLOPramide 10 MG tablet Commonly known as:  REGLAN Take 1 tablet (10 mg total) by mouth every 8 (eight) hours as needed.   sertraline 50 MG tablet Commonly known as:  ZOLOFT Take 1 tablet (50 mg total) by mouth daily. For depression   traZODone 100 MG tablet Commonly known as:  DESYREL Take 1 tablet (100 mg total) by mouth at bedtime. For sleep        Judeth HornLawrence, [place]nay Holzmann, NP 07/03/2018  8:45 PM

## 2018-07-03 NOTE — MAU Note (Signed)
Pt has c/o of nausea, headache and spotting. Spotting started a couple of days ago, wearing a panty liner. Not taken anything for the headache.

## 2018-07-03 NOTE — Discharge Instructions (Signed)
Morning Sickness  Morning sickness is when a woman feels nauseous during pregnancy. This nauseous feeling may or may not come with vomiting. It often occurs in the morning, but it can be a problem at any time of day. Morning sickness is most common during the first trimester. In some cases, it may continue throughout pregnancy. Although morning sickness is unpleasant, it is usually harmless unless the woman develops severe and continual vomiting (hyperemesis gravidarum), a condition that requires more intense treatment. What are the causes? The exact cause of this condition is not known, but it seems to be related to normal hormonal changes that occur in pregnancy. What increases the risk? You are more likely to develop this condition if:  You experienced nausea or vomiting before your pregnancy.  You had morning sickness during a previous pregnancy.  You are pregnant with more than one baby, such as twins. What are the signs or symptoms? Symptoms of this condition include:  Nausea.  Vomiting. How is this diagnosed? This condition is usually diagnosed based on your signs and symptoms. How is this treated? In many cases, treatment is not needed for this condition. Making some changes to what you eat may help to control symptoms. Your health care provider may also prescribe or recommend:  Vitamin B6 supplements.  Anti-nausea medicines.  Ginger. Follow these instructions at home: Medicines  Take over-the-counter and prescription medicines only as told by your health care provider. Do not use any prescription, over-the-counter, or herbal medicines for morning sickness without first talking with your health care provider.  Taking multivitamins before getting pregnant can prevent or decrease the severity of morning sickness in most women. Eating and drinking  Eat a piece of dry toast or crackers before getting out of bed in the morning.  Eat 5 or 6 small meals a day.  Eat dry and  bland foods, such as rice or a baked potato. Foods that are high in carbohydrates are often helpful.  Avoid greasy, fatty, and spicy foods.  Have someone cook for you if the smell of any food causes nausea and vomiting.  If you feel nauseous after taking prenatal vitamins, take the vitamins at night or with a snack.  Snack on protein foods between meals if you are hungry. Nuts, yogurt, and cheese are good options.  Drink fluids throughout the day.  Try ginger ale made with real ginger, ginger tea made from fresh grated ginger, or ginger candies. General instructions  Do not use any products that contain nicotine or tobacco, such as cigarettes and e-cigarettes. If you need help quitting, ask your health care provider.  Get an air purifier to keep the air in your house free of odors.  Get plenty of fresh air.  Try to avoid odors that trigger your nausea.  Consider trying these methods to help relieve symptoms: ? Wearing an acupressure wristband. These wristbands are often worn for seasickness. ? Acupuncture. Contact a health care provider if:  Your home remedies are not working and you need medicine.  You feel dizzy or light-headed.  You are losing weight. Get help right away if:  You have persistent and uncontrolled nausea and vomiting.  You faint.  You have severe pain in your abdomen. Summary  Morning sickness is when a woman feels nauseous during pregnancy. This nauseous feeling may or may not come with vomiting.  Morning sickness is most common during the first trimester.  It often occurs in the morning, but it can be a problem at  any time of day.  In many cases, treatment is not needed for this condition. Making some changes to what you eat may help to control symptoms. This information is not intended to replace advice given to you by your health care provider. Make sure you discuss any questions you have with your health care provider. Document Released:  07/29/2006 Document Revised: 07/10/2016 Document Reviewed: 07/10/2016 Elsevier Interactive Patient Education  2019 Elsevier [employer].      [facility] Prenatal Care Providers   Center for [employer] [facility] at [facility]       Phone: [phone]  Center for [facility] at [place] Phone: [phone]  Center for [employer] at [place]  Phone: [phone]  Center for [facility] at [facility]  Phone: [phone]  Center for [facility] at [place]  Phone: [phone]  [facility]       Phone: [phone]  [facility]    Phone: [phone]   [facility] [place])    Phone: [phone]  [facility]    Phone: [phone]  [facility]    Phone: [phone]   [facility]  Phone: [phone]  [facility]    Phone: [phone]  [facility]   Phone: [phone]  [facility]    Phone: [phone]

## 2018-07-04 LAB — GC/CHLAMYDIA PROBE AMP (~~LOC~~) NOT AT ARMC
Chlamydia: POSITIVE — AB
Neisseria Gonorrhea: NEGATIVE

## 2018-07-05 ENCOUNTER — Telehealth: Payer: Self-pay | Admitting: Medical

## 2018-07-05 DIAGNOSIS — A749 Chlamydial infection, unspecified: Secondary | ICD-10-CM

## 2018-07-05 MED ORDER — AZITHROMYCIN 250 MG PO TABS: 1000.0000 mg | ORAL_TABLET | Freq: Once | ORAL | 0 refills | Status: AC

## 2018-07-05 NOTE — Telephone Encounter (Addendum)
Lindsay [name] tested positive for  Chlamydia. Patient was called by RN and allergies and pharmacy confirmed. Rx sent to pharmacy of choice.   Marny Lowenstein, PA-C 07/05/2018 2:54 PM      ----- Message from Kathe Becton, RN sent at 07/05/2018  1:27 PM EST ----- This patient tested positive for :  chlamydia  She "has NKDA",, I have informed the patient of her results and confirmed her pharmacy is correct in her chart. Please send Rx.   Thank you,   Kathe Becton, RN   Results faxed to [facility] Department.

## 2018-08-01 LAB — OB RESULTS CONSOLE RPR: RPR: NONREACTIVE

## 2018-08-01 LAB — OB RESULTS CONSOLE GC/CHLAMYDIA
Chlamydia: NEGATIVE
Gonorrhea: NEGATIVE

## 2018-08-01 LAB — OB RESULTS CONSOLE ABO/RH: RH Type: POSITIVE

## 2018-08-01 LAB — OB RESULTS CONSOLE HEPATITIS B SURFACE ANTIGEN: Hepatitis B Surface Ag: NEGATIVE

## 2018-08-01 LAB — OB RESULTS CONSOLE ANTIBODY SCREEN: Antibody Screen: NEGATIVE

## 2018-08-01 LAB — OB RESULTS CONSOLE RUBELLA ANTIBODY, IGM: Rubella: IMMUNE

## 2018-08-01 LAB — OB RESULTS CONSOLE HIV ANTIBODY (ROUTINE TESTING): HIV: NONREACTIVE

## 2018-09-05 ENCOUNTER — Other Ambulatory Visit: Payer: Self-pay

## 2018-09-13 ENCOUNTER — Other Ambulatory Visit: Payer: Self-pay

## 2018-09-13 ENCOUNTER — Inpatient Hospital Stay (HOSPITAL_COMMUNITY)
Admission: AD | Admit: 2018-09-13 | Discharge: 2018-09-13 | Disposition: A | Discharge status: Home / Self Care | Payer: BLUE CROSS/BLUE SHIELD | Attending: Obstetrics & Gynecology | Admitting: Obstetrics & Gynecology

## 2018-09-13 DIAGNOSIS — J45909 Unspecified asthma, uncomplicated: Secondary | ICD-10-CM | POA: Diagnosis not present

## 2018-09-13 DIAGNOSIS — O99512 Diseases of the respiratory system complicating pregnancy, second trimester: Secondary | ICD-10-CM | POA: Diagnosis not present

## 2018-09-13 DIAGNOSIS — Z3A18 18 weeks gestation of pregnancy: Secondary | ICD-10-CM | POA: Insufficient documentation

## 2018-09-13 DIAGNOSIS — R509 Fever, unspecified: Secondary | ICD-10-CM | POA: Diagnosis not present

## 2018-09-13 DIAGNOSIS — Z3492 Encounter for supervision of normal pregnancy, unspecified, second trimester: Secondary | ICD-10-CM

## 2018-09-13 NOTE — MAU Note (Addendum)
Pt had fever at work last night and was sent home. Pt also has cough. Not severe. No pain, bleeding, or pregnancy related complaints. MSE done in Triage with Knox Saliva CNM. Pt declined to be transported to ED.

## 2018-09-13 NOTE — MAU Provider Note (Addendum)
History     CSN: [id number]  Arrival date and time: 09/13/18 1544   First Provider Initiated Contact with Patient 09/13/18 1600      Chief Complaint  Patient presents with  . Cough  . Fever   HPI Lindsay [name] is a 28 y.o. G1P0 at [redacted]w[redacted]d who presents to MAU with chief complaint of high fever and non-productive cough. Patient states she was sent home from work yesterday for fever of 102. She states her employer instructed her to stay home. She does not have a thermometer at home and is unable to see if her temp is worse but states she feels significantly worse than she did yesterday afternoon. She also complains of non-productive cough.    She called her OB care provider's nurse line and states she was told to come to MAU for evaluation. Patient denies OB complaints. To CNM and RN she denies abdominal pain of any degree, vaginal bleeding or dysuria.    OB History    Gravida  1   Para      Term      Preterm      AB      Living        SAB      TAB      Ectopic      Multiple      Live Births              Past Medical History:  Diagnosis Date  . Asthma   . Schizophrenia [facility])     Past Surgical History:  Procedure Laterality Date  . TONSILLECTOMY      Family History  Problem Relation Age of Onset  . Cancer Father        liver    Social History   Tobacco Use  . Smoking status: Never Smoker  . Smokeless tobacco: Never Used  Substance Use Topics  . Alcohol use: No  . Drug use: No    Allergies: No Known Allergies  No medications prior to admission.    Review of Systems  Constitutional: Positive for fever.  Respiratory: Positive for cough.   Gastrointestinal: Negative for abdominal pain, diarrhea, nausea and vomiting.  Genitourinary: Negative for difficulty urinating, dysuria, vaginal bleeding, vaginal discharge and vaginal pain.  Musculoskeletal: Negative for back pain.  Neurological: Negative for dizziness, syncope, weakness and  headaches.  All other systems reviewed and are negative.  Physical Exam   Blood pressure 122/80, pulse 88, temperature 98.7 F (37.1 C), temperature source Oral, resp. rate 18, last menstrual period 05/10/2018, SpO2 100 %.  Physical Exam  Nursing note and vitals reviewed. Constitutional: She is oriented to person, place, and time. She appears well-developed and well-nourished.  HENT:  Mouth/Throat: Oropharynx is clear and moist and mucous membranes are normal.  Cardiovascular: Normal rate.  Respiratory: Effort normal.  GI: There is no abdominal tenderness.  Neurological: She is alert and oriented to person, place, and time.  Skin: Skin is warm and dry.  Psychiatric: She has a normal mood and affect. Her behavior is normal. Judgment and thought content normal.    MAU Course  Procedures  --Patient seen in triage. Risk criteria for COVID evaluation reviewed with patient. Explained that her status is not acute and since she is hemodynamically stable and repeatedly denies OB complaints, she , will be discharged home for quarantine. --Explained to patient that transfer to ED for evaluation is appropriate if she reports concerning symptoms like cyanosis, SOB, syncope or weakness.  She denies these symptoms --Patient repeatedly declines transfer to ED --Patient encouraged to contact roommate and consider arranging for roommate to stay with friends/family in separate location. --HPI and assessment discussed with Joni Reining from Infection Prevention team. She confirmed patient does not meet criteria for lab evaluation and should remain home unless she develops any of the reviewed symptoms.  Patient Vitals for the past 24 hrs:  BP Temp Temp src Pulse Resp SpO2  09/13/18 1608 122/80 98.7 F (37.1 C) Oral 88 18 100 %     Assessment and Plan  --28 y.o. G1P0 at [redacted]w[redacted]d  --FHT 165 by Doppler --Patient declines transfer to ED for evaluation of symptoms --Discharge home to self-quarantine  Calvert Cantor, CNM 09/13/2018, 5:11 PM

## 2018-09-13 NOTE — Discharge Instructions (Signed)
Coronavirus (COVID-19) Are you at risk?  Are you at risk for the Coronavirus (COVID-19)?  To be considered high risk for Coronavirus (COVID-19), you have to meet the following criteria:  Traveled to [place], [place], [place], [place] or [place]; or in the [place] to [place], [place], [place], or [place]; and have fever, cough, and shortness of breath within the last 2 weeks of travel or  Been in close contact with a person diagnosed with COVID-19 within the last 2 weeks and have fever, cough, and shortness of breath  If you do not meet these criteria, you are considered low risk for COVID-19.  What to do if you are high risk for COVID-19?   If you are having a medical emergency, call 911.  Seek medical care right away. Before you go to a doctors office, urgent care or emergency department, call ahead and tell them about your recent travel, contact with someone diagnosed with COVID-19, and your symptoms. You should receive instructions from your physicians office regarding next steps of care.   When you arrive at healthcare provider, tell the healthcare staff immediately you have returned from visiting [place], [place], [place], [place] or [place]; or traveled in the [place] to [place], [place], [place], or [place]; in the last two weeks or you have been in close contact with a person diagnosed with COVID-19 in the last 2 weeks.    Tell the health care staff about your symptoms: fever, cough and shortness of breath.  After you have been seen by a medical provider, you will be either: o Tested for (COVID-19) and discharged home on quarantine except to seek medical care if symptoms worsen, and asked to  - Stay home and avoid contact with others until you get your results (4-5 days)  - Avoid travel on public transportation if possible (such as bus, train, or airplane) or o Sent to the Emergency Department by EMS for evaluation, COVID-19 testing, and possible admission  depending on your condition and test results.   What to do if you are LOW RISK for COVID-19?  Reduce your risk of any infection by using the same precautions used for avoiding the common cold or flu:   Wash your hands often with soap and warm water for at least 20 seconds.  If soap and water are not readily available, use an alcohol-based hand sanitizer with at least 60% alcohol.   If coughing or sneezing, cover your mouth and nose by coughing or sneezing into the elbow areas of your shirt or coat, into a tissue or into your sleeve (not your hands).  Avoid shaking hands with others and consider head nods or verbal greetings only.  Avoid touching your eyes, nose, or mouth with unwashed hands.   Avoid close contact with people who are sick.  Avoid places or events with large numbers of people in one location, like concerts or sporting events.  Carefully consider travel plans you have or are making.  If you are planning any travel outside or inside the [place], visit the [place] webpage for the latest health notices.  If you have some symptoms but not all symptoms, continue to monitor at home and seek medical attention if your symptoms worsen.  If you are having a medical emergency, call 911.   Additional healthcare options for patients  [place] / e-Visit:  [email or web address]           [facility]: [phone]  [facility]: [phone]                   [facility]: [phone]

## 2018-10-24 ENCOUNTER — Encounter (HOSPITAL_COMMUNITY): Payer: Self-pay

## 2018-11-15 ENCOUNTER — Other Ambulatory Visit: Payer: Self-pay

## 2018-11-15 ENCOUNTER — Ambulatory Visit (HOSPITAL_COMMUNITY): Payer: Self-pay | Admitting: Obstetrics and Gynecology

## 2018-11-15 ENCOUNTER — Ambulatory Visit (HOSPITAL_COMMUNITY): Payer: Medicaid Other | Attending: Obstetrics and Gynecology

## 2018-12-28 ENCOUNTER — Ambulatory Visit (HOSPITAL_COMMUNITY)
Admission: RE | Admit: 2018-12-28 | Discharge: 2018-12-28 | Disposition: A | Discharge status: Home / Self Care | Payer: Medicaid Other | Source: Ambulatory Visit | Attending: Obstetrics and Gynecology | Admitting: Obstetrics and Gynecology

## 2018-12-28 ENCOUNTER — Encounter (HOSPITAL_COMMUNITY): Payer: Self-pay

## 2018-12-28 ENCOUNTER — Other Ambulatory Visit (HOSPITAL_COMMUNITY): Payer: Self-pay | Admitting: Obstetrics and Gynecology

## 2018-12-28 ENCOUNTER — Ambulatory Visit (HOSPITAL_COMMUNITY): Payer: Medicaid Other

## 2018-12-28 DIAGNOSIS — O99810 Abnormal glucose complicating pregnancy: Secondary | ICD-10-CM

## 2018-12-31 ENCOUNTER — Other Ambulatory Visit: Payer: Self-pay

## 2018-12-31 ENCOUNTER — Encounter (HOSPITAL_COMMUNITY): Payer: Self-pay

## 2018-12-31 ENCOUNTER — Inpatient Hospital Stay (HOSPITAL_COMMUNITY)
Admission: AD | Admit: 2018-12-31 | Discharge: 2018-12-31 | Disposition: A | Discharge status: Home / Self Care | Payer: Medicaid Other | Attending: Obstetrics and Gynecology | Admitting: Obstetrics and Gynecology

## 2018-12-31 DIAGNOSIS — O26893 Other specified pregnancy related conditions, third trimester: Secondary | ICD-10-CM | POA: Insufficient documentation

## 2018-12-31 DIAGNOSIS — T63301A Toxic effect of unspecified spider venom, accidental (unintentional), initial encounter: Secondary | ICD-10-CM | POA: Diagnosis not present

## 2018-12-31 DIAGNOSIS — Z3689 Encounter for other specified antenatal screening: Secondary | ICD-10-CM | POA: Diagnosis not present

## 2018-12-31 DIAGNOSIS — X58XXXA Exposure to other specified factors, initial encounter: Secondary | ICD-10-CM | POA: Diagnosis not present

## 2018-12-31 DIAGNOSIS — R030 Elevated blood-pressure reading, without diagnosis of hypertension: Secondary | ICD-10-CM | POA: Insufficient documentation

## 2018-12-31 DIAGNOSIS — Z3A33 33 weeks gestation of pregnancy: Secondary | ICD-10-CM | POA: Diagnosis not present

## 2018-12-31 DIAGNOSIS — O9A213 Injury, poisoning and certain other consequences of external causes complicating pregnancy, third trimester: Secondary | ICD-10-CM | POA: Diagnosis not present

## 2018-12-31 LAB — URINALYSIS, ROUTINE W REFLEX MICROSCOPIC
Bilirubin Urine: NEGATIVE
Glucose, UA: NEGATIVE mg/dL
Hgb urine dipstick: NEGATIVE
Ketones, ur: NEGATIVE mg/dL
Nitrite: NEGATIVE
Protein, ur: 100 mg/dL — AB
Specific Gravity, Urine: 1.026 (ref 1.005–1.030)
Squamous Epithelial / HPF: >50 — ABNORMAL HIGH (ref 0–5)
pH: 6 (ref 5.0–8.0)

## 2018-12-31 MED ORDER — SULFAMETHOXAZOLE-TRIMETHOPRIM 800-160 MG PO TABS: 1.0000 | ORAL_TABLET | Freq: Two times a day (BID) | ORAL | 0 refills | Status: AC

## 2018-12-31 MED ORDER — LACTATED RINGERS IV BOLUS
1000.0000 mL | Freq: Once | INTRAVENOUS | Status: AC
  Administered 2018-12-31: 1000 mL via INTRAVENOUS

## 2018-12-31 MED ORDER — NIFEDIPINE 10 MG PO CAPS
10.0000 mg | ORAL_CAPSULE | ORAL | Status: DC | PRN
  Administered 2018-12-31 (×2): 10 mg via ORAL
  Filled 2018-12-31 (×2): qty 1

## 2018-12-31 NOTE — Discharge Instructions (Signed)
Spider Bite Spider bites are not common. Most spider bites do not cause serious problems. There are only a few types of spider bites that can cause serious health problems. What are the causes? This condition is caused when you make contact with a spider in a way that traps the spider against your skin. What are the signs or symptoms? Some spider bites may cause symptoms within 1 hour after the bite. For other spider bites, it may take 1-2 days for symptoms to appear. Symptoms include:  A raised area that is red.  Redness and swelling around the area of the bite.  Pain in the area of the bite. A few types of spiders, such as the black widow spider or the brown recluse spider, can inject poison (venom) into a bite wound. This causes more serious symptoms. Symptoms of these bites vary, and may include:  Muscle cramps.  Feeling sick to your stomach (nauseous).  Throwing up (vomiting).  Pain in your belly (abdomen).  A fever.  A skin sore (lesion) that spreads. This can break into an open wound (skin ulcer).  Feeling light-headed or dizzy. How is this treated? Many spider bites do not need treatment. If needed, treatment may include:  Icing and keeping the bite area raised (elevated).  Taking or applying over-the-counter or prescription medicines to help with symptoms such as pain and itching.  Having a tetanus shot.  Taking antibiotic medicine. Follow these instructions at home: Medicines  Take or apply over-the-counter and prescription medicines only as told by your doctor.  If you were prescribed an antibiotic medicine, take it as told by your doctor. Do not stop using it even if you start to feel better. Managing pain and swelling   If told, put ice on the bite area. ? Put ice in a plastic bag. ? Place a towel between your skin and the bag. ? Leave the ice on for 20 minutes, 2-3 times a day.  Raise the bite area above the level of your heart while you are sitting or  lying down. General instructions   Do not scratch the bite area.  Keep the bite area clean and dry. Wash the bite area with soap and water each day as told by your doctor.  Keep all follow-up visits as told by your doctor. This is important. Contact a doctor if:  Your bite does not get better after 3 days.  Your bite turns black or purple.  Near the bite, you have more: ? Redness. ? Swelling. ? Pain. Get help right away if:  You get shortness of breath or chest pain.  You have fluid, blood, or pus coming from the bite area.  You have painful muscle cramps or sudden muscle tightening (spasms).  You have belly pain.  You feel sick to your stomach or you throw up.  You feel more tired or sleepy than normal. Summary  Spider bites are not common. When spider bites do happen, most do not cause serious health problems.  Take or apply all medicines only as told by your doctor.  Keep the bite area clean and dry. Wash the bite area with soap and water each day as told by your doctor.  Contact a doctor if you have more redness, swelling, or pain near the bite.  Get help right away if you get shortness of breath or chest pain. This information is not intended to replace advice given to you by your health care provider. Make sure you discuss any   questions you have with your health care provider. Document Released: 07/10/2010 Document Revised: 01/17/2018 Document Reviewed: 01/17/2018 Elsevier Patient Education  2020 [employer]  

## 2018-12-31 NOTE — MAU Note (Signed)
Lindsay [name] is a 28 y.o. at [redacted]w[redacted]d here in MAU reporting: was bite by a spider yesterday behind her right leg and now it is swollen, states the area is irritated. No vaginal bleeding, no abdominal pain, no LOF. Decreased fetal movement today, has felt some movement but not as much as normal for the past few days. States she feels like she is breathing but states this has been going on her whole pregnancy.  Onset of complaint: yesterday  Pain score: 0/10  Vitals:   12/31/18 1422  BP: (!) 132/55  Pulse: (!) 104  Resp: 20  Temp: 98.4 F (36.9 C)  SpO2: 100%      FHT:138  Lab orders placed from triage: UA

## 2018-12-31 NOTE — MAU Note (Signed)
Urine in lab Not enough for culture tube 

## 2018-12-31 NOTE — MAU Provider Note (Signed)
History     CSN: [id number]  Arrival date and time: 12/31/18 1404   None     Chief Complaint  Patient presents with  . Spider bite  . Decreased Fetal Movement   HPI Lindsay [name] is a 28 y.o. G1P0 at [redacted]w[redacted]d who presents to MAU with chief complaint of "irritation" surrounding a spider bite she sustained yesterday on her the back of her right thigh. She reports new onset swelling and itching at the site. She denies purulent discharge, bruising or streaking.   Patient reports additional complaint of decreased fetal movement. This is a new problem, onset "a few days ago". She states her baby is usually busiest in the evening but that has not been the case lately. She is unaware of interventions for stimulating fetal movement. She reports "normal" level of fetal movement upon arrival and throughout her evaluation in MAU.  Patient also reports increased work of breathing. She states this is not a complaint or inconvenience "this is how I am when I'm pregnant".   Patient denies vaginal bleeding, leaking of fluid, abnormal vaginal discharge, fever, falls, or recent illness.   OB History    Gravida  1   Para      Term      Preterm      AB      Living        SAB      TAB      Ectopic      Multiple      Live Births              Past Medical History:  Diagnosis Date  . Asthma   . Schizophrenia [facility])     Past Surgical History:  Procedure Laterality Date  . TONSILLECTOMY      Family History  Problem Relation Age of Onset  . Cancer Father        liver    Social History   Tobacco Use  . Smoking status: Never Smoker  . Smokeless tobacco: Never Used  Substance Use Topics  . Alcohol use: No  . Drug use: No    Allergies: No Known Allergies  Medications Prior to Admission  Medication Sig Dispense Refill Last Dose  . ARIPiprazole (ABILIFY) 20 MG tablet Take 1 tablet (20 mg total) by mouth at bedtime. For mood control 15 tablet 0   . ARIPiprazole ER  (ABILIFY MAINTENA) 400 MG SRER injection Inject 2 mLs (400 mg total) into the muscle every 28 (twenty-eight) days. (Due on 11-21-17): For mood control 1 each 0   . benztropine (COGENTIN) 0.5 MG tablet Take 1 tablet (0.5 mg total) by mouth daily. For prevention of drug induced tremors 30 tablet 0   . ferrous sulfate 325 (65 FE) MG tablet Take 325 mg by mouth daily with breakfast.     . hydrOXYzine (ATARAX/VISTARIL) 50 MG tablet Take 1 tablet (50 mg total) by mouth every 6 (six) hours as needed (mild/moderate anxiety). 75 tablet 0   . metoCLOPramide (REGLAN) 10 MG tablet Take 1 tablet (10 mg total) by mouth every 8 (eight) hours as needed. 30 tablet 0   . pantoprazole (PROTONIX) 40 MG tablet Take 40 mg by mouth daily.     . Prenatal Vit-Fe Fumarate-FA (PRENATAL VITAMINS PO) Take by mouth.     . sertraline (ZOLOFT) 50 MG tablet Take 1 tablet (50 mg total) by mouth daily. For depression 30 tablet 0   . traZODone (DESYREL) 100 MG tablet Take  1 tablet (100 mg total) by mouth at bedtime. For sleep 30 tablet 0     Review of Systems  Constitutional: Negative for chills, fatigue and fever.  Respiratory: Positive for shortness of breath.   Gastrointestinal: Negative for abdominal pain, nausea and vomiting.  Genitourinary: Negative for difficulty urinating, dysuria, flank pain, vaginal bleeding, vaginal discharge and vaginal pain.  Musculoskeletal: Negative for back pain.  Skin: Positive for wound.  Neurological: Negative for dizziness, syncope, weakness and headaches.  All other systems reviewed and are negative.  Physical Exam   Blood pressure (!) 132/55, pulse (!) 104, temperature 98.4 F (36.9 C), temperature source Oral, resp. rate 20, last menstrual period 05/10/2018, SpO2 100 %.  Physical Exam  Nursing note and vitals reviewed. Constitutional: She is oriented to person, place, and time. She appears well-developed and well-nourished.  Cardiovascular: Normal rate.  Respiratory: Effort normal  and breath sounds normal. No respiratory distress.  GI: She exhibits no distension. There is no abdominal tenderness. There is no rebound and no guarding.  Gravid  Genitourinary:    No vaginal discharge.   Neurological: She is alert and oriented to person, place, and time.  Skin: Skin is warm and dry. Lesion noted.  Lesion noted on posterior aspect of right thigh. 1/2 inch margin of swelling surrounding wound. Non-tender, minimal erythema, no streaking.   Psychiatric: She has a normal mood and affect. Her behavior is normal. Judgment and thought content normal.    MAU Course/MDM  Procedures  --SVE: closed/thick/posterior --Prenatal records visible in Media tab, reviewed by me. 148/72 at 14 w 1d New OB appt. Elevated BP x 2 in MAU, then normal. Asymptomatic --Reactive tracing: baseline 140, moderate variability, positive accels, no decels --Toco: occasional contractions resolved with IV fluid bolus and Procardia x 2. Patient denies contraction pain throughout time in MAU --Dr. Juleen ChinaKohut, ED Attending consulted about spider bite treatment. Advised Bactrim x 7 days to minimize risk of secondary skin infection. Discussed with patient pros and cons of Bactrim BID vs Augmentin QID and respective pregnancy categories. Patient verbalizes preference for Bactrim rx to pharmacy.  Patient Vitals for the past 24 hrs:  BP Temp Temp src Pulse Resp SpO2  12/31/18 1627 126/64 - - 87 - -  12/31/18 1600 (!) 143/76 - - 99 - -  12/31/18 1559 (!) 143/76 - - - - -  12/31/18 1422 (!) 132/55 98.4 F (36.9 C) Oral (!) 104 20 100 %   Meds ordered this encounter  Medications  . lactated ringers bolus 1,000 mL  . NIFEdipine (PROCARDIA) capsule 10 mg  . sulfamethoxazole-trimethoprim (BACTRIM DS) 800-160 MG tablet    Sig: Take 1 tablet by mouth 2 (two) times daily for 7 days.    Dispense:  14 tablet    Refill:  0    Order Specific Question:   Supervising Provider    Answer:   Duane LopeEURE, LUTHER H [[id number]]       Assessment and Plan  --28 y.o. G1P0 at 669w4d  --Reactive tracing --Spider bite, initial encounter --New onset elevated BP without diagnosis of hypertension, no severe range, no severe signs or symptoms --Discharge home in stable condition  F/U: Pt advised to have BP check tomorrow in clinic. [facility] [place] to coordinate followup.  Calvert CantorSamantha C Weinhold, CNM 12/31/2018, 4:58 PM

## 2019-01-12 ENCOUNTER — Encounter (HOSPITAL_COMMUNITY): Payer: Self-pay

## 2019-01-12 ENCOUNTER — Inpatient Hospital Stay (HOSPITAL_BASED_OUTPATIENT_CLINIC_OR_DEPARTMENT_OTHER): Payer: Medicaid Other

## 2019-01-12 ENCOUNTER — Inpatient Hospital Stay (HOSPITAL_COMMUNITY)
Admission: AD | Admit: 2019-01-12 | Discharge: 2019-01-12 | Disposition: A | Discharge status: Home / Self Care | Payer: Medicaid Other | Source: Ambulatory Visit | Attending: Obstetrics and Gynecology | Admitting: Obstetrics and Gynecology

## 2019-01-12 ENCOUNTER — Other Ambulatory Visit: Payer: Self-pay

## 2019-01-12 DIAGNOSIS — Z3A35 35 weeks gestation of pregnancy: Secondary | ICD-10-CM

## 2019-01-12 DIAGNOSIS — O368130 Decreased fetal movements, third trimester, not applicable or unspecified: Secondary | ICD-10-CM | POA: Diagnosis not present

## 2019-01-12 DIAGNOSIS — O99213 Obesity complicating pregnancy, third trimester: Secondary | ICD-10-CM

## 2019-01-12 DIAGNOSIS — Z3689 Encounter for other specified antenatal screening: Secondary | ICD-10-CM

## 2019-01-12 DIAGNOSIS — R030 Elevated blood-pressure reading, without diagnosis of hypertension: Secondary | ICD-10-CM

## 2019-01-12 LAB — PROTEIN / CREATININE RATIO, URINE
Creatinine, Urine: 75.46 mg/dL
Protein Creatinine Ratio: 0.28 mg/mg{Cre} — ABNORMAL HIGH (ref 0.00–0.15)
Total Protein, Urine: 21 mg/dL

## 2019-01-12 LAB — CBC
HCT: 34.2 % — ABNORMAL LOW (ref 36.0–46.0)
Hemoglobin: 10.5 g/dL — ABNORMAL LOW (ref 12.0–15.0)
MCH: 21.4 pg — ABNORMAL LOW (ref 26.0–34.0)
MCHC: 30.7 g/dL (ref 30.0–36.0)
MCV: 69.8 fL — ABNORMAL LOW (ref 80.0–100.0)
Platelets: 235 10*3/uL (ref 150–400)
RBC: 4.9 MIL/uL (ref 3.87–5.11)
RDW: 15.4 % (ref 11.5–15.5)
WBC: 11.8 10*3/uL — ABNORMAL HIGH (ref 4.0–10.5)
nRBC: 0 % (ref 0.0–0.2)

## 2019-01-12 LAB — COMPREHENSIVE METABOLIC PANEL
ALT: 11 U/L (ref 0–44)
AST: 15 U/L (ref 15–41)
Albumin: 2.6 g/dL — ABNORMAL LOW (ref 3.5–5.0)
Alkaline Phosphatase: 73 U/L (ref 38–126)
Anion gap: 8 (ref 5–15)
BUN: <5 mg/dL — ABNORMAL LOW (ref 6–20)
CO2: 20 mmol/L — ABNORMAL LOW (ref 22–32)
Calcium: 8.9 mg/dL (ref 8.9–10.3)
Chloride: 108 mmol/L (ref 98–111)
Creatinine, Ser: 0.89 mg/dL (ref 0.44–1.00)
GFR calc Af Amer: >60 mL/min (ref >60)
GFR calc non Af Amer: >60 mL/min (ref >60)
Glucose, Bld: 108 mg/dL — ABNORMAL HIGH (ref 70–99)
Potassium: 3.7 mmol/L (ref 3.5–5.1)
Sodium: 136 mmol/L (ref 135–145)
Total Bilirubin: 0.5 mg/dL (ref 0.3–1.2)
Total Protein: 6.3 g/dL — ABNORMAL LOW (ref 6.5–8.1)

## 2019-01-12 NOTE — MAU Provider Note (Addendum)
Chief Complaint:  Decreased Fetal Movement   First Provider Initiated Contact with Patient 01/12/19 [phone]      HPI: Lindsay [name] is a 28 y.o. G1P0 at 6535w2dwho presents to maternity admissions reporting decreased fetal movement. [name].Has had this before on other days She reports good fetal movement, denies LOF, vaginal bleeding, vaginal itching/burning, urinary symptoms, h/a, dizziness, n/v, diarrhea, constipation or fever/chills.  She denies headache, visual changes or RUQ abdominal pain.   Past Medical History: Past Medical History:  Diagnosis Date  . Asthma   . Schizophrenia (HCC)     Past obstetric history: OB History  Gravida Para Term Preterm AB Living  1            SAB TAB Ectopic Multiple Live Births               # Outcome Date GA Lbr Len/2nd Weight Sex Delivery Anes PTL Lv  1 Current             Past Surgical History: Past Surgical History:  Procedure Laterality Date  . TONSILLECTOMY      Family History: Family History  Problem Relation Age of Onset  . Cancer Father        liver    Social History: Social History   Tobacco Use  . Smoking status: Never Smoker  . Smokeless tobacco: Never Used  Substance Use Topics  . Alcohol use: No  . Drug use: No    Allergies: No Known Allergies  Meds:  Medications Prior to Admission  Medication Sig Dispense Refill Last Dose  . ARIPiprazole (ABILIFY) 20 MG tablet Take 1 tablet (20 mg total) by mouth at bedtime. For mood control 15 tablet 0   . ARIPiprazole ER (ABILIFY MAINTENA) 400 MG SRER injection Inject 2 mLs (400 mg total) into the muscle every 28 (twenty-eight) days. (Due on 11-21-17): For mood control 1 each 0   . benztropine (COGENTIN) 0.5 MG tablet Take 1 tablet (0.5 mg total) by mouth daily. For prevention of drug induced tremors 30 tablet 0   . ferrous sulfate 325 (65 FE) MG tablet Take 325 mg by mouth daily with breakfast.     . hydrOXYzine (ATARAX/VISTARIL) 50 MG tablet Take 1 tablet (50 mg total) by mouth  every 6 (six) hours as needed (mild/moderate anxiety). 75 tablet 0   . metoCLOPramide (REGLAN) 10 MG tablet Take 1 tablet (10 mg total) by mouth every 8 (eight) hours as needed. 30 tablet 0   . pantoprazole (PROTONIX) 40 MG tablet Take 40 mg by mouth daily.     . Prenatal Vit-Fe Fumarate-FA (PRENATAL VITAMINS PO) Take by mouth.     . sertraline (ZOLOFT) 50 MG tablet Take 1 tablet (50 mg total) by mouth daily. For depression 30 tablet 0   . traZODone (DESYREL) 100 MG tablet Take 1 tablet (100 mg total) by mouth at bedtime. For sleep 30 tablet 0     I have reviewed patient's Past Medical Hx, Surgical Hx, Family Hx, Social Hx, medications and allergies.   ROS:  Review of Systems  Constitutional: Negative for chills and fever.  Eyes: Negative for visual disturbance.  Respiratory: Negative for shortness of breath.   Gastrointestinal: Negative for abdominal pain, constipation, diarrhea and nausea.  Genitourinary: Negative for pelvic pain and vaginal bleeding.  Neurological: Negative for headaches.   Other systems negative  Physical Exam   Patient Vitals for the past 24 hrs:  BP Temp Temp src Pulse Resp SpO2 Height Weight  01/12/19 0843 101/63 - - 97 - - - -  01/12/19 0731 125/81 - - 90 - - - -  01/12/19 0730 - - - - - 97 % - -  01/12/19 0725 - - - - - 98 % - -  01/12/19 0717 128/60 - - 85 - - - -  01/12/19 0700 (!) 135/56 - - 72 - - - -  01/12/19 0645 (!) 144/94 - - 99 - - - -  01/12/19 0616 - - - - - - 5\' 3"  (1.6 m) (!) 144.2 kg  01/12/19 0615 126/70 98.6 F (37 C) Oral (!) 104 18 - - -   Vitals:   01/12/19 0725 01/12/19 0730 01/12/19 0731 01/12/19 0843  BP:   125/81 101/63  Pulse:   90 97  Resp:      Temp:      TempSrc:      SpO2: 98% 97%    Weight:      Height:        Constitutional: Well-developed, well-nourished female in no acute distress.  Cardiovascular: normal rate and rhythm Respiratory: normal effort, clear to auscultation bilaterally GI: Abd soft, non-tender,  gravid appropriate for gestational age.   No rebound or guarding. MS: Extremities nontender, no edema, normal ROM Neurologic: Alert and oriented x 4.  GU: Neg CVAT.    FHT:  Baseline 140 , moderate variability, small accelerations present, no decelerations Contractions:  Irregular     Labs: Results for orders placed or performed during the hospital encounter of 01/12/19 (from the past 24 hour(s))  CBC     Status: Abnormal   Collection Time: 01/12/19  7:04 AM  Result Value Ref Range   WBC 11.8 (H) 4.0 - 10.5 K/uL   RBC 4.90 3.87 - 5.11 MIL/uL   Hemoglobin 10.5 (L) 12.0 - 15.0 g/dL   HCT 96.034.2 (L) 45.436.0 - 09.846.0 %   MCV 69.8 (L) 80.0 - 100.0 fL   MCH 21.4 (L) 26.0 - 34.0 pg   MCHC 30.7 30.0 - 36.0 g/dL   RDW 11.915.4 14.711.5 - 82.915.5 %   Platelets 235 150 - 400 K/uL   nRBC 0.0 0.0 - 0.2 %  Comprehensive metabolic panel     Status: Abnormal   Collection Time: 01/12/19  7:04 AM  Result Value Ref Range   Sodium 136 135 - 145 mmol/L   Potassium 3.7 3.5 - 5.1 mmol/L   Chloride 108 98 - 111 mmol/L   CO2 20 (L) 22 - 32 mmol/L   Glucose, Bld 108 (H) 70 - 99 mg/dL   BUN <5 (L) 6 - 20 mg/dL   Creatinine, Ser 5.620.89 0.44 - 1.00 mg/dL   Calcium 8.9 8.9 - 13.010.3 mg/dL   Total Protein 6.3 (L) 6.5 - 8.1 g/dL   Albumin 2.6 (L) 3.5 - 5.0 g/dL   AST 15 15 - 41 U/L   ALT 11 0 - 44 U/L   Alkaline Phosphatase 73 38 - 126 U/L   Total Bilirubin 0.5 0.3 - 1.2 mg/dL   GFR calc non Af Amer >60 >60 mL/min   GFR calc Af Amer >60 >60 mL/min   Anion gap 8 5 - 15  Protein / creatinine ratio, urine     Status: Abnormal   Collection Time: 01/12/19  7:40 AM  Result Value Ref Range   Creatinine, Urine 75.46 mg/dL   Total Protein, Urine 21 mg/dL   Protein Creatinine Ratio 0.28 (H) 0.00 - 0.15 mg/mg[Cre]   Protein/Cr Ratio  pending  --/--/A POS Performed at [facility], [address], [place],  [address]  [phone])  Imaging:  No results found.  MAU Course/MDM: I have ordered labs and reviewed  results. Pr/Cr pending NST reviewed, equivocal  Assessment: Single intrauterine at [redacted]w[redacted]d Decreased fetal movement Equivocal nonstress test  Plan: Care turned over to oncoming provider  Hansel Feinstein CNM, MSN Certified Nurse-Midwife 01/12/2019 9:17 AM    -Assumed care of patient at 0800 -Will send for BPP in order to assess fetal well-being: BPP is 8/8 -PCR is 0.28, CMP shows 0.89; CBC normal. -NST: 140, mod var, present acel, neg acel, no contractions. Much improved after patient had crackers and juice in MAU.  -Discussed with Dr. Charlesetta Garibaldi about follow up plan. Communicated labs, NST, BPP as patient is bordering on gestational hypertension vs. Pre-eclampsia. Options include admission vs. close outpatient follow up. Per Dr. Charlesetta Garibaldi, will monitor for Cat 1 strip and then discharge.  -During NST, patient felt strong fetal movements.   1. NST (non-stress test) reactive    2. Patient stable for discharge; she has been in contact with her office to determine follow up. I communicated to the patient that Dr. Charlesetta Garibaldi wants to see her early next week  3. Reviewed fetal kick counts and pre-e precautions, patient knows to return to MAU if her condition were to change or worsen.   Maye Hides

## 2019-01-12 NOTE — Discharge Instructions (Signed)
Preeclampsia and Eclampsia Preeclampsia is a serious condition that may develop during pregnancy. This condition causes high blood pressure and increased protein in your urine along with other symptoms, such as headaches and vision changes. These symptoms may develop as the condition gets worse. Preeclampsia may occur at 20 weeks of pregnancy or later. Diagnosing and treating preeclampsia early is very important. If not treated early, it can cause serious problems for you and your baby. One problem it can lead to is eclampsia. Eclampsia is a condition that causes muscle jerking or shaking (convulsions or seizures) and other serious problems for the mother. During pregnancy, delivering your baby may be the best treatment for preeclampsia or eclampsia. For most women, preeclampsia and eclampsia symptoms go away after giving birth. In rare cases, a woman may develop preeclampsia after giving birth (postpartum preeclampsia). This usually occurs within 48 hours after childbirth but may occur up to 6 weeks after giving birth. What are the causes? The cause of preeclampsia is not known. What increases the risk? The following risk factors make you more likely to develop preeclampsia:  Being pregnant for the first time.  Having had preeclampsia during a past pregnancy.  Having a family history of preeclampsia.  Having high blood pressure.  Being pregnant with more than one baby.  Being 35 or older.  Being African-American.  Having kidney disease or diabetes.  Having medical conditions such as lupus or blood diseases.  Being very overweight (obese). What are the signs or symptoms? The most common symptoms are:  Severe headaches.  Vision problems, such as blurred or double vision.  Abdominal pain, especially upper abdominal pain. Other symptoms that may develop as the condition gets worse include:  Sudden weight gain.  Sudden swelling of the hands, face, legs, and feet.  Severe nausea  and vomiting.  Numbness in the face, arms, legs, and feet.  Dizziness.  Urinating less than usual.  Slurred speech.  Convulsions or seizures. How is this diagnosed? There are no screening tests for preeclampsia. Your health care provider will ask you about symptoms and check for signs of preeclampsia during your prenatal visits. You may also have tests that include:  Checking your blood pressure.  Urine tests to check for protein. Your health care provider will check for this at every prenatal visit.  Blood tests.  Monitoring your baby's heart rate.  Ultrasound. How is this treated? You and your health care provider will determine the treatment approach that is best for you. Treatment may include:  Having more frequent prenatal exams to check for signs of preeclampsia, if you have an increased risk for preeclampsia.  Medicine to lower your blood pressure.  Staying in the hospital, if your condition is severe. There, treatment will focus on controlling your blood pressure and the amount of fluids in your body (fluid retention).  Taking medicine (magnesium sulfate) to prevent seizures. This may be given as an injection or through an IV.  Taking a low-dose aspirin during your pregnancy.  Delivering your baby early. You may have your labor started with medicine (induced), or you may have a cesarean delivery. Follow these instructions at home: Eating and drinking   Drink enough fluid to keep your urine pale yellow.  Avoid caffeine. Lifestyle  Do not use any products that contain nicotine or tobacco, such as cigarettes and e-cigarettes. If you need help quitting, ask your health care provider.  Do not use alcohol or drugs.  Avoid stress as much as possible. Rest and get   and get plenty of sleep. General instructions  Take over-the-counter and prescription medicines only as told by your health care provider.  When lying down, lie on your left side. This keeps pressure  off your major blood vessels.  When sitting or lying down, raise (elevate) your feet. Try putting some pillows underneath your lower legs.  Exercise regularly. Ask your health care provider what kinds of exercise are best for you.  Keep all follow-up and prenatal visits as told by your health care provider. This is important. How is this prevented? There is no known way of preventing preeclampsia or eclampsia from developing. However, to lower your risk of complications and detect problems early:  Get regular prenatal care. Your health care provider may be able to diagnose and treat the condition early.  Maintain a healthy weight. Ask your health care provider for help managing weight gain during pregnancy.  Work with your health care provider to manage any long-term (chronic) health conditions you have, such as diabetes or kidney problems.  You may have tests of your blood pressure and kidney function after giving birth.  Your health care provider may have you take low-dose aspirin during your next pregnancy. Contact a health care provider if:  You have symptoms that your health care provider told you may require more treatment or monitoring, such as: ? Headaches. ? Nausea or vomiting. ? Abdominal pain. ? Dizziness. ? Light-headedness. Get help right away if:  You have severe: ? Abdominal pain. ? Headaches that do not get better. ? Dizziness. ? Vision problems. ? Confusion. ? Nausea or vomiting.  You have any of the following: ? A seizure. ? Sudden, rapid weight gain. ? Sudden swelling in your hands, ankles, or face. ? Trouble moving any part of your body. ? Numbness in any part of your body. ? Trouble speaking. ? Abnormal bleeding.  You faint. Summary  Preeclampsia is a serious condition that may develop during pregnancy.  This condition causes high blood pressure and increased protein in your urine along with other symptoms, such as headaches and vision  changes.  Diagnosing and treating preeclampsia early is very important. If not treated early, it can cause serious problems for you and your baby.  Get help right away if you have symptoms that your health care provider told you to watch for. This information is not intended to replace advice given to you by your health care provider. Make sure you discuss any questions you have with your health care provider. Document Released: 06/04/2000 Document Revised: 02/07/2018 Document Reviewed: 01/12/2016 Elsevier Patient Education  2020 [employer]. Fetal Movement Counts Patient Name: ________________________________________________ Patient Due Date: ____________________ What is a fetal movement count?  A fetal movement count is the number of times that you feel your baby move during a certain amount of time. This may also be called a fetal kick count. A fetal movement count is recommended for every pregnant woman. You may be asked to start counting fetal movements as early as week 28 of your pregnancy. Pay attention to when your baby is most active. You may notice your baby's sleep and wake cycles. You may also notice things that make your baby move more. You should do a fetal movement count:  When your baby is normally most active.  At the same time each day. A good time to count movements is while you are resting, after having something to eat and drink. How do I count fetal movements? 1. Find a quiet, comfortable area.  lie down on your side. 2. Write down the date, the start time and stop time, and the number of movements that you felt between those two times. Take this information with you to your health care visits. 3. For 2 hours, count kicks, flutters, swishes, rolls, and jabs. You should feel at least 10 movements during 2 hours. 4. You may stop counting after you have felt 10 movements. 5. If you do not feel 10 movements in 2 hours, have something to eat and drink. Then, keep resting and  counting for 1 hour. If you feel at least 4 movements during that hour, you may stop counting. Contact a health care provider if:  You feel fewer than 4 movements in 2 hours.  Your baby is not moving like he or she usually does. Date: ____________ Start time: ____________ Stop time: ____________ Movements: ____________ Date: ____________ Start time: ____________ Stop time: ____________ Movements: ____________ Date: ____________ Start time: ____________ Stop time: ____________ Movements: ____________ Date: ____________ Start time: ____________ Stop time: ____________ Movements: ____________ Date: ____________ Start time: ____________ Stop time: ____________ Movements: ____________ Date: ____________ Start time: ____________ Stop time: ____________ Movements: ____________ Date: ____________ Start time: ____________ Stop time: ____________ Movements: ____________ Date: ____________ Start time: ____________ Stop time: ____________ Movements: ____________ Date: ____________ Start time: ____________ Stop time: ____________ Movements: ____________ This information is not intended to replace advice given to you by your health care provider. Make sure you discuss any questions you have with your health care provider. Document Released: 07/07/2006 Document Revised: 06/27/2018 Document Reviewed: 07/17/2015 Elsevier Patient Education  2020 [employer] Inc[employer]  

## 2019-01-12 NOTE — MAU Note (Signed)
Reports accidentally inhaled a articificial nail several years ago and doctor was never able to find it.  Sneezed up nail today- brought in a bag.  Concerned whether baby ok, hasn't been moving as much.

## 2019-01-15 ENCOUNTER — Inpatient Hospital Stay (HOSPITAL_COMMUNITY)
Admission: AD | Admit: 2019-01-15 | Discharge: 2019-01-15 | Disposition: A | Discharge status: Home / Self Care | Payer: Medicaid Other | Attending: Obstetrics and Gynecology | Admitting: Obstetrics and Gynecology

## 2019-01-15 ENCOUNTER — Other Ambulatory Visit: Payer: Self-pay

## 2019-01-15 ENCOUNTER — Encounter (HOSPITAL_COMMUNITY): Payer: Self-pay | Admitting: *Deleted

## 2019-01-15 DIAGNOSIS — O163 Unspecified maternal hypertension, third trimester: Secondary | ICD-10-CM

## 2019-01-15 DIAGNOSIS — O26893 Other specified pregnancy related conditions, third trimester: Secondary | ICD-10-CM | POA: Diagnosis not present

## 2019-01-15 DIAGNOSIS — R030 Elevated blood-pressure reading, without diagnosis of hypertension: Secondary | ICD-10-CM | POA: Insufficient documentation

## 2019-01-15 DIAGNOSIS — Z3A35 35 weeks gestation of pregnancy: Secondary | ICD-10-CM | POA: Diagnosis not present

## 2019-01-15 LAB — CBC
HCT: 36.4 % (ref 36.0–46.0)
Hemoglobin: 11.3 g/dL — ABNORMAL LOW (ref 12.0–15.0)
MCH: 21.4 pg — ABNORMAL LOW (ref 26.0–34.0)
MCHC: 31 g/dL (ref 30.0–36.0)
MCV: 69.1 fL — ABNORMAL LOW (ref 80.0–100.0)
Platelets: 280 10*3/uL (ref 150–400)
RBC: 5.27 MIL/uL — ABNORMAL HIGH (ref 3.87–5.11)
RDW: 15.2 % (ref 11.5–15.5)
WBC: 11.1 10*3/uL — ABNORMAL HIGH (ref 4.0–10.5)
nRBC: 0 % (ref 0.0–0.2)

## 2019-01-15 LAB — COMPREHENSIVE METABOLIC PANEL
ALT: 11 U/L (ref 0–44)
AST: 15 U/L (ref 15–41)
Albumin: 2.8 g/dL — ABNORMAL LOW (ref 3.5–5.0)
Alkaline Phosphatase: 84 U/L (ref 38–126)
Anion gap: 9 (ref 5–15)
BUN: 6 mg/dL (ref 6–20)
CO2: 19 mmol/L — ABNORMAL LOW (ref 22–32)
Calcium: 9.1 mg/dL (ref 8.9–10.3)
Chloride: 107 mmol/L (ref 98–111)
Creatinine, Ser: 0.88 mg/dL (ref 0.44–1.00)
GFR calc Af Amer: >60 mL/min (ref >60)
GFR calc non Af Amer: >60 mL/min (ref >60)
Glucose, Bld: 131 mg/dL — ABNORMAL HIGH (ref 70–99)
Potassium: 3.5 mmol/L (ref 3.5–5.1)
Sodium: 135 mmol/L (ref 135–145)
Total Bilirubin: 0.6 mg/dL (ref 0.3–1.2)
Total Protein: 6.6 g/dL (ref 6.5–8.1)

## 2019-01-15 LAB — PROTEIN / CREATININE RATIO, URINE
Creatinine, Urine: 67.95 mg/dL
Protein Creatinine Ratio: 0.24 mg/mg{Cre} — ABNORMAL HIGH (ref 0.00–0.15)
Total Protein, Urine: 16 mg/dL

## 2019-01-15 LAB — URIC ACID: Uric Acid, Serum: 3.9 mg/dL (ref 2.5–7.1)

## 2019-01-15 NOTE — Discharge Instructions (Signed)
You need to have a blood pressure check at [facility] on Wednesday 01/17/19 or Thursday 01/18/19.

## 2019-01-15 NOTE — MAU Provider Note (Signed)
History     CSN: [id number]  Arrival date and time: 01/15/19 1436   First Provider Initiated Contact with Patient 01/15/19 1517      Chief Complaint  Patient presents with  . Hypertension   HPI  Lindsay [name] is a 28 y.o. year old G1P0 female at 6879w5d weeks gestation who presents to MAU reporting elevated BP with home BP cuff (160/113). When she called [facility], she was advised there were no available appts for her to come there. They advised her to come to MAU for evaluation. She reports she has never gotten a BP that high on the home cuff before. She states that her BP is usually 130s/80s. She thinks there is something wrong with her cuff, because she has no symptoms. She denies H/A, dizziness, blurry vision, epigastric pain, or swelling. She does report some swelling on 7/26, but it resolved.    **Patient brought home cuff with her -- small wrist cuff -- BP = 198/118 while BP on MAU cuff 126/67.   Past Medical History:  Diagnosis Date  . Asthma   . Schizophrenia [facility](HCC)     Past Surgical History:  Procedure Laterality Date  . TONSILLECTOMY      Family History  Problem Relation Age of Onset  . Cancer Father        liver    Social History   Tobacco Use  . Smoking status: Never Smoker  . Smokeless tobacco: Never Used  Substance Use Topics  . Alcohol use: No  . Drug use: No    Allergies: No Known Allergies  Medications Prior to Admission  Medication Sig Dispense Refill Last Dose  . benztropine (COGENTIN) 0.5 MG tablet Take 1 tablet (0.5 mg total) by mouth daily. For prevention of drug induced tremors 30 tablet 0 01/15/2019 at Unknown time  . hydrOXYzine (ATARAX/VISTARIL) 50 MG tablet Take 1 tablet (50 mg total) by mouth every 6 (six) hours as needed (mild/moderate anxiety). 75 tablet 0 01/15/2019 at Unknown time  . metoCLOPramide (REGLAN) 10 MG tablet Take 1 tablet (10 mg total) by mouth every 8 (eight) hours as needed. 30 tablet 0 Past Month at Unknown time  .  Prenatal Vit-Fe Fumarate-FA (PRENATAL VITAMINS PO) Take by mouth.   01/15/2019 at Unknown time  . ARIPiprazole (ABILIFY) 20 MG tablet Take 1 tablet (20 mg total) by mouth at bedtime. For mood control 15 tablet 0   . ARIPiprazole ER (ABILIFY MAINTENA) 400 MG SRER injection Inject 2 mLs (400 mg total) into the muscle every 28 (twenty-eight) days. (Due on 11-21-17): For mood control 1 each 0   . ferrous sulfate 325 (65 FE) MG tablet Take 325 mg by mouth daily with breakfast.     . pantoprazole (PROTONIX) 40 MG tablet Take 40 mg by mouth daily.     . sertraline (ZOLOFT) 50 MG tablet Take 1 tablet (50 mg total) by mouth daily. For depression 30 tablet 0   . traZODone (DESYREL) 100 MG tablet Take 1 tablet (100 mg total) by mouth at bedtime. For sleep 30 tablet 0     Review of Systems  Constitutional: Negative.   HENT: Negative.   Eyes: Negative.   Respiratory: Negative.   Cardiovascular: Negative for leg swelling (had some yesterday, but resolved today).       Elevated BP at home  Gastrointestinal: Negative.   Endocrine: Negative.   Genitourinary: Negative.   Musculoskeletal: Negative.   Skin: Negative.   Allergic/Immunologic: Negative.   Neurological:  Negative.   Hematological: Negative.   Psychiatric/Behavioral: Negative.    Physical Exam   Patient Vitals for the past 24 hrs:  BP Temp Pulse Resp SpO2 Weight  01/15/19 1647 (!) 149/71 - (!) 103 - - -  01/15/19 1632 (!) 150/66 - (!) 102 - - -  01/15/19 1617 129/68 - (!) 116 - - -  01/15/19 1602 120/70 - (!) 107 - - -  01/15/19 1546 (!) 145/63 - 97 - - -  01/15/19 1531 126/67 - (!) 103 - - -  01/15/19 1516 122/67 - (!) 101 - - -  01/15/19 1510 124/60 - (!) 105 - - -  01/15/19 1446 (!) 155/89 98.3 F (36.8 C) 100 20 97 % (!) 143 kg    Physical Exam  Nursing note and vitals reviewed. Constitutional: She is oriented to person, place, and time. She appears well-developed and well-nourished.  HENT:  Head: Normocephalic and atraumatic.   Eyes: Pupils are equal, round, and reactive to light.  Neck: Normal range of motion.  Cardiovascular: Normal rate.  Respiratory: Effort normal.  GI: Soft.  Genitourinary:    Genitourinary Comments: no cervical exam indicated   Musculoskeletal: Normal range of motion.  Neurological: She is alert and oriented to person, place, and time.  Skin: Skin is warm and dry.  Psychiatric: She has a normal mood and affect. Her behavior is normal. Judgment and thought content normal.   NST - FHR: 150 bpm / moderate variability / accels present / decels absent / TOCO: none  MAU Course  Procedures  MDM [facility] CBC CMP P/C Ratio Serial BP's   *Consult with Dr. Ilda Basset @ 1706 - notified of patient's complaints, assessments, lab & NST results, tx plan d/c home, F/U BP check in office on 7/29 or 7/30  Dr. Ilda Basset agrees with plan, recommends 24 hr urine and BP check in office either 7/28/or 7/29 -- ok to d/c home *Consult with Dr. Mancel Bale @ 1712 - notified of patient's complaints, assessments and Dr. Ilda Basset' recommendation -- will get patient a F/U appt on 01/16/19; orders received to add on a Uric Acid   Results for orders placed or performed during the hospital encounter of 01/15/19 (from the past 24 hour(s))  Protein / creatinine ratio, urine     Status: Abnormal   Collection Time: 01/15/19  2:45 PM  Result Value Ref Range   Creatinine, Urine 67.95 mg/dL   Total Protein, Urine 16 mg/dL   Protein Creatinine Ratio 0.24 (H) 0.00 - 0.15 mg/mg[Cre]  CBC     Status: Abnormal   Collection Time: 01/15/19  3:02 PM  Result Value Ref Range   WBC 11.1 (H) 4.0 - 10.5 K/uL   RBC 5.27 (H) 3.87 - 5.11 MIL/uL   Hemoglobin 11.3 (L) 12.0 - 15.0 g/dL   HCT 36.4 36.0 - 46.0 %   MCV 69.1 (L) 80.0 - 100.0 fL   MCH 21.4 (L) 26.0 - 34.0 pg   MCHC 31.0 30.0 - 36.0 g/dL   RDW 15.2 11.5 - 15.5 %   Platelets 280 150 - 400 K/uL   nRBC 0.0 0.0 - 0.2 %  Comprehensive metabolic panel     Status: Abnormal   Collection  Time: 01/15/19  3:02 PM  Result Value Ref Range   Sodium 135 135 - 145 mmol/L   Potassium 3.5 3.5 - 5.1 mmol/L   Chloride 107 98 - 111 mmol/L   CO2 19 (L) 22 - 32 mmol/L   Glucose, Bld 131 (H)  70 - 99 mg/dL   BUN 6 6 - 20 mg/dL   Creatinine, Ser 4.090.88 0.44 - 1.00 mg/dL   Calcium 9.1 8.9 - 81.110.3 mg/dL   Total Protein 6.6 6.5 - 8.1 g/dL   Albumin 2.8 (L) 3.5 - 5.0 g/dL   AST 15 15 - 41 U/L   ALT 11 0 - 44 U/L   Alkaline Phosphatase 84 38 - 126 U/L   Total Bilirubin 0.6 0.3 - 1.2 mg/dL   GFR calc non Af Amer >60 >60 mL/min   GFR calc Af Amer >60 >60 mL/min   Anion gap 9 5 - 15      Assessment and Plan  Elevated blood pressure affecting pregnancy in third trimester, antepartum  - F/U appt with [facility] on 01/16/19 -- to be scheduled - Discharge patient - Advised of warning signs of PEC - Patient verbalized an understanding of the plan of care and agrees.     Lindsay Moraolitta Anajah Sterbenz, MSN, CNM 01/15/2019, 3:17 PM

## 2019-01-15 NOTE — MAU Note (Signed)
160/113 on home cuff. Called her dr.  Karen Kays to get appt in office, wanted her to come here for further eval.  Denies HA, visual changes, epigastric pain or swelling (was yesterday though).

## 2019-01-17 ENCOUNTER — Encounter (HOSPITAL_COMMUNITY): Payer: Self-pay

## 2019-01-17 ENCOUNTER — Inpatient Hospital Stay (HOSPITAL_BASED_OUTPATIENT_CLINIC_OR_DEPARTMENT_OTHER): Payer: Medicaid Other

## 2019-01-17 ENCOUNTER — Other Ambulatory Visit: Payer: Self-pay

## 2019-01-17 ENCOUNTER — Inpatient Hospital Stay (HOSPITAL_COMMUNITY)
Admission: AD | Admit: 2019-01-17 | Discharge: 2019-01-17 | Disposition: A | Discharge status: Home / Self Care | Payer: Medicaid Other | Attending: Obstetrics & Gynecology | Admitting: Obstetrics & Gynecology

## 2019-01-17 DIAGNOSIS — O289 Unspecified abnormal findings on antenatal screening of mother: Secondary | ICD-10-CM | POA: Diagnosis not present

## 2019-01-17 DIAGNOSIS — R51 Headache: Secondary | ICD-10-CM | POA: Diagnosis present

## 2019-01-17 DIAGNOSIS — Z3A36 36 weeks gestation of pregnancy: Secondary | ICD-10-CM | POA: Insufficient documentation

## 2019-01-17 DIAGNOSIS — O99213 Obesity complicating pregnancy, third trimester: Secondary | ICD-10-CM | POA: Diagnosis not present

## 2019-01-17 DIAGNOSIS — O133 Gestational [pregnancy-induced] hypertension without significant proteinuria, third trimester: Secondary | ICD-10-CM | POA: Insufficient documentation

## 2019-01-17 DIAGNOSIS — O368330 Maternal care for abnormalities of the fetal heart rate or rhythm, third trimester, not applicable or unspecified: Secondary | ICD-10-CM | POA: Insufficient documentation

## 2019-01-17 DIAGNOSIS — O368390 Maternal care for abnormalities of the fetal heart rate or rhythm, unspecified trimester, not applicable or unspecified: Secondary | ICD-10-CM

## 2019-01-17 DIAGNOSIS — O288 Other abnormal findings on antenatal screening of mother: Secondary | ICD-10-CM

## 2019-01-17 LAB — PROTEIN / CREATININE RATIO, URINE
Creatinine, Urine: 29.92 mg/dL
Protein Creatinine Ratio: 0.23 mg/mg{Cre} — ABNORMAL HIGH (ref 0.00–0.15)
Total Protein, Urine: 7 mg/dL

## 2019-01-17 LAB — URINALYSIS, ROUTINE W REFLEX MICROSCOPIC
Bilirubin Urine: NEGATIVE
Glucose, UA: NEGATIVE mg/dL
Ketones, ur: NEGATIVE mg/dL
Nitrite: NEGATIVE
Protein, ur: NEGATIVE mg/dL
Specific Gravity, Urine: 1.003 — ABNORMAL LOW (ref 1.005–1.030)
pH: 7 (ref 5.0–8.0)

## 2019-01-17 LAB — COMPREHENSIVE METABOLIC PANEL
ALT: 10 U/L (ref 0–44)
AST: 14 U/L — ABNORMAL LOW (ref 15–41)
Albumin: 2.7 g/dL — ABNORMAL LOW (ref 3.5–5.0)
Alkaline Phosphatase: 74 U/L (ref 38–126)
Anion gap: 7 (ref 5–15)
BUN: 6 mg/dL (ref 6–20)
CO2: 19 mmol/L — ABNORMAL LOW (ref 22–32)
Calcium: 9.1 mg/dL (ref 8.9–10.3)
Chloride: 109 mmol/L (ref 98–111)
Creatinine, Ser: 0.78 mg/dL (ref 0.44–1.00)
GFR calc Af Amer: >60 mL/min (ref >60)
GFR calc non Af Amer: >60 mL/min (ref >60)
Glucose, Bld: 84 mg/dL (ref 70–99)
Potassium: 3.6 mmol/L (ref 3.5–5.1)
Sodium: 135 mmol/L (ref 135–145)
Total Bilirubin: 0.7 mg/dL (ref 0.3–1.2)
Total Protein: 6.3 g/dL — ABNORMAL LOW (ref 6.5–8.1)

## 2019-01-17 LAB — CBC
HCT: 34.8 % — ABNORMAL LOW (ref 36.0–46.0)
Hemoglobin: 10.5 g/dL — ABNORMAL LOW (ref 12.0–15.0)
MCH: 20.9 pg — ABNORMAL LOW (ref 26.0–34.0)
MCHC: 30.2 g/dL (ref 30.0–36.0)
MCV: 69.3 fL — ABNORMAL LOW (ref 80.0–100.0)
Platelets: 255 10*3/uL (ref 150–400)
RBC: 5.02 MIL/uL (ref 3.87–5.11)
RDW: 15.2 % (ref 11.5–15.5)
WBC: 10.9 10*3/uL — ABNORMAL HIGH (ref 4.0–10.5)
nRBC: 0 % (ref 0.0–0.2)

## 2019-01-17 MED ORDER — ACETAMINOPHEN 500 MG PO TABS
1000.0000 mg | ORAL_TABLET | Freq: Once | ORAL | Status: AC
  Administered 2019-01-17: 1000 mg via ORAL
  Filled 2019-01-17: qty 2

## 2019-01-17 NOTE — MAU Note (Addendum)
Pt reports having headache, nausea and feels dehydrated with dry mouth and has tingling hands  since 0700 this morning.  Denies vision change, new swelling  or URQ pain.  Denies LOF or VB. +FM.

## 2019-01-17 NOTE — Discharge Instructions (Signed)
Hypertension During Pregnancy °High blood pressure (hypertension) is when the force of blood pumping through the arteries is too strong. Arteries are blood vessels that carry blood from the heart throughout the body. Hypertension during pregnancy can be mild or severe. Severe hypertension during pregnancy (preeclampsia) is a medical emergency that requires prompt evaluation and treatment. °Different types of hypertension can happen during pregnancy. These include: °· Chronic hypertension. This happens when you had high blood pressure before you became pregnant, and it continues during the pregnancy. Hypertension that develops before you are [redacted] weeks pregnant and continues during the pregnancy is also called chronic hypertension. If you have chronic hypertension, it will not go away after you have your baby. You will need follow-up visits with your health care provider after you have your baby. Your doctor may want you to keep taking medicine for your blood pressure. °· Gestational hypertension. This is hypertension that develops after the 20th week of pregnancy. Gestational hypertension usually goes away after you have your baby, but your health care provider will need to monitor your blood pressure to make sure that it is getting better. °· Preeclampsia. This is severe hypertension during pregnancy. This can cause serious complications for you and your baby and can also cause complications for you after the delivery of your baby. °· Postpartum preeclampsia. You may develop severe hypertension after giving birth. This usually occurs within 48 hours after childbirth but may occur up to 6 weeks after giving birth. This is rare. °How does this affect me? °Women who have hypertension during pregnancy have a greater chance of developing hypertension later in life or during future pregnancies. In some cases, hypertension during pregnancy can cause serious complications, such as: °· Stroke. °· Heart attack. °· Injury to  other organs, such as kidneys, lungs, or liver. °· Preeclampsia. °· Convulsions or seizures. °· Placental abruption. °How does this affect my baby? °Hypertension during pregnancy can affect your baby. Your baby may: °· Be born early (prematurely). °· Not weigh as much as he or she should at birth (low birth weight). °· Not tolerate labor well, leading to an unplanned cesarean delivery. °What are the risks? °There are certain factors that make it more likely for you to develop hypertension during pregnancy. These include: °· Having hypertension during a previous pregnancy. °· Being overweight. °· Being age 35 or older. °· Being pregnant for the first time. °· Being pregnant with more than one baby. °· Becoming pregnant using fertilization methods, such as IVF (in vitro fertilization). °· Having other medical problems, such as diabetes, kidney disease, or lupus. °· Having a family history of hypertension. °What can I do to lower my risk? °The exact cause of hypertension during pregnancy is not known. You may be able to lower your risk by: °· Maintaining a healthy weight. °· Eating a healthy and balanced diet. °· Following your health care provider's instructions about treating any long-term conditions that you had before becoming pregnant. °It is very important to keep all of your prenatal care appointments. Your health care provider will check your blood pressure and make sure that your pregnancy is progressing as expected. If a problem is found, early treatment can prevent complications. °How is this treated? °Treatment for hypertension during pregnancy varies depending on the type of hypertension you have and how serious it is. °· If you were taking medicine for high blood pressure before you became pregnant, talk with your health care provider. You may need to change medicine during pregnancy because   some medicines, like ACE inhibitors, may not be considered safe for your baby. °· If you have gestational  hypertension, your health care provider may order medicine to treat this during pregnancy. °· If you are at risk for preeclampsia, your health care provider may recommend that you take a low-dose aspirin during your pregnancy. °· If you have severe hypertension, you may need to be hospitalized so you and your baby can be monitored closely. You may also need to be given medicine to lower your blood pressure. This medicine may be given by mouth or through an IV. °· In some cases, if your condition gets worse, you may need to deliver your baby early. °Follow these instructions at home: °Eating and drinking ° °· Drink enough fluid to keep your urine pale yellow. °· Avoid caffeine. °Lifestyle °· Do not use any products that contain nicotine or tobacco, such as cigarettes, e-cigarettes, and chewing tobacco. If you need help quitting, ask your health care provider. °· Do not use alcohol or drugs. °· Avoid stress as much as possible. °· Rest and get plenty of sleep. °· Regular exercise can help to reduce your blood pressure. Ask your health care provider what kinds of exercise are best for you. °General instructions °· Take over-the-counter and prescription medicines only as told by your health care provider. °· Keep all prenatal and follow-up visits as told by your health care provider. This is important. °Contact a health care provider if: °· You have symptoms that your health care provider told you may require more treatment or monitoring, such as: °? Headaches. °? Nausea or vomiting. °? Abdominal pain. °? Dizziness. °? Light-headedness. °Get help right away if: °· You have: °? Severe abdominal pain that does not get better with treatment. °? A severe headache that does not get better. °? Vomiting that does not get better. °? Sudden, rapid weight gain. °? Sudden swelling in your hands, ankles, or face. °? Vaginal bleeding. °? Blood in your urine. °? Blurred or double vision. °? Shortness of breath or chest  pain. °? Weakness on one side of your body. °? Difficulty speaking. °· Your baby is not moving as much as usual. °Summary °· High blood pressure (hypertension) is when the force of blood pumping through the arteries is too strong. °· Hypertension during pregnancy can cause problems for you and your baby. °· Treatment for hypertension during pregnancy varies depending on the type of hypertension you have and how serious it is. °· Keep all prenatal and follow-up visits as told by your health care provider. This is important. °This information is not intended to replace advice given to you by your health care provider. Make sure you discuss any questions you have with your health care provider. °Document Released: 02/23/2011 Document Revised: 09/28/2018 Document Reviewed: 07/04/2018 °Elsevier Patient Education © 2020 [employer] ° ° ° °Fetal Movement Counts °Patient Name: ________________________________________________ Patient Due Date: ____________________ °What is a fetal movement count? ° °A fetal movement count is the number of times that you feel your baby move during a certain amount of time. This may also be called a fetal kick count. A fetal movement count is recommended for every pregnant woman. You may be asked to start counting fetal movements as early as week 28 of your pregnancy. °Pay attention to when your baby is most active. You may notice your baby's sleep and wake cycles. You may also notice things that make your baby move more. You should do a fetal movement count: °·   When your baby is normally most active. °· At the same time each day. °A good time to count movements is while you are resting, after having something to eat and drink. °How do I count fetal movements? °1. Find a quiet, comfortable area. Sit, or lie down on your side. °2. Write down the date, the start time and stop time, and the number of movements that you felt between those two times. Take this information with you to your health  care visits. °3. For 2 hours, count kicks, flutters, swishes, rolls, and jabs. You should feel at least 10 movements during 2 hours. °4. You may stop counting after you have felt 10 movements. °5. If you do not feel 10 movements in 2 hours, have something to eat and drink. Then, keep resting and counting for 1 hour. If you feel at least 4 movements during that hour, you may stop counting. °Contact a health care provider if: °· You feel fewer than 4 movements in 2 hours. °· Your baby is not moving like he or she usually does. °Date: ____________ Start time: ____________ Stop time: ____________ Movements: ____________ °Date: ____________ Start time: ____________ Stop time: ____________ Movements: ____________ °Date: ____________ Start time: ____________ Stop time: ____________ Movements: ____________ °Date: ____________ Start time: ____________ Stop time: ____________ Movements: ____________ °Date: ____________ Start time: ____________ Stop time: ____________ Movements: ____________ °Date: ____________ Start time: ____________ Stop time: ____________ Movements: ____________ °Date: ____________ Start time: ____________ Stop time: ____________ Movements: ____________ °Date: ____________ Start time: ____________ Stop time: ____________ Movements: ____________ °Date: ____________ Start time: ____________ Stop time: ____________ Movements: ____________ °This information is not intended to replace advice given to you by your health care provider. Make sure you discuss any questions you have with your health care provider. °Document Released: 07/07/2006 Document Revised: 06/27/2018 Document Reviewed: 07/17/2015 °Elsevier Patient Education © 2020 [employer] ° °

## 2019-01-17 NOTE — MAU Provider Note (Signed)
Chief Complaint  Patient presents with  . Headache  . Nausea     First Provider Initiated Contact with Patient 01/17/19 1130      S: Lindsay [name]  is a 28 y.o. y.o. year old G1P0 female at 4591w0d weeks gestation who presents to MAU with headache & nausea. Recently diagnosed with gestational hypertension & was told yesterday in the office she would be induced at 37 weeks. Current blood pressure medication: none.  Reports headache since this morning & believes it is due to lack of sleep but wanted to make sure. Hasn't treated symptoms. Also has had some nausea today & feels like she is dehydrated. No vomiting or diarrhea.  Has a BP cuff at home but stopped using it b/c it is too small & gave inaccurate measurements.   Associated symptoms: endorses Headache, denies vision changes, denies epigastric pain Contractions: denies Vaginal bleeding: denies Fetal movement: good  O:  Patient Vitals for the past 24 hrs:  BP Temp Temp src Pulse Resp SpO2 Height Weight  01/17/19 1316 132/76 - - 98 - - - -  01/17/19 1250 140/73 - - 81 - 99 % - -  01/17/19 1230 118/67 - - 86 - 99 % - -  01/17/19 1215 (!) 114/56 - - 83 - 99 % - -  01/17/19 1200 (!) 108/48 - - 92 - 98 % - -  01/17/19 1124 126/77 - - (!) 107 - - - -  01/17/19 1111 (!) 145/80 - - - - - - -  01/17/19 1110 (!) 145/80 - - - - - - -  01/17/19 1105 - 98.7 F (37.1 C) Oral (!) 102 18 98 % 5\' 3"  (1.6 m) (!) 142.8 kg   General: NAD Heart: Regular rate Lungs: Normal rate and effort Abd: Soft, NT, Gravid, S=D Extremities: No Pedal edema Neuro: 2+ deep tendon reflexes, No clonus     NST:  Baseline: 135 bpm, Variability: Good {> 6 bpm), Accelerations: Reactive and Decelerations: prolonged decel  Results for orders placed or performed during the hospital encounter of 01/17/19 (from the past 24 hour(s))  Urinalysis, Routine w reflex microscopic     Status: Abnormal   Collection Time: 01/17/19 11:13 AM  Result Value Ref Range   Color,  Urine YELLOW YELLOW   APPearance CLEAR CLEAR   Specific Gravity, Urine 1.003 (L) 1.005 - 1.030   pH 7.0 5.0 - 8.0   Glucose, UA NEGATIVE NEGATIVE mg/dL   Hgb urine dipstick SMALL (A) NEGATIVE   Bilirubin Urine NEGATIVE NEGATIVE   Ketones, ur NEGATIVE NEGATIVE mg/dL   Protein, ur NEGATIVE NEGATIVE mg/dL   Nitrite NEGATIVE NEGATIVE   Leukocytes,Ua LARGE (A) NEGATIVE   RBC / HPF 0-5 0 - 5 RBC/hpf   WBC, UA 6-10 0 - 5 WBC/hpf   Bacteria, UA FEW (A) NONE SEEN   Squamous Epithelial / LPF 6-10 0 - 5  Protein / creatinine ratio, urine     Status: Abnormal   Collection Time: 01/17/19 11:35 AM  Result Value Ref Range   Creatinine, Urine 29.92 mg/dL   Total Protein, Urine 7 mg/dL   Protein Creatinine Ratio 0.23 (H) 0.00 - 0.15 mg/mg[Cre]  CBC     Status: Abnormal   Collection Time: 01/17/19 12:01 PM  Result Value Ref Range   WBC 10.9 (H) 4.0 - 10.5 K/uL   RBC 5.02 3.87 - 5.11 MIL/uL   Hemoglobin 10.5 (L) 12.0 - 15.0 g/dL   HCT 16.134.8 (L) 09.636.0 - 04.546.0 %  MCV 69.3 (L) 80.0 - 100.0 fL   MCH 20.9 (L) 26.0 - 34.0 pg   MCHC 30.2 30.0 - 36.0 g/dL   RDW 16.115.2 09.611.5 - 04.515.5 %   Platelets 255 150 - 400 K/uL   nRBC 0.0 0.0 - 0.2 %  Comprehensive metabolic panel     Status: Abnormal   Collection Time: 01/17/19 12:01 PM  Result Value Ref Range   Sodium 135 135 - 145 mmol/L   Potassium 3.6 3.5 - 5.1 mmol/L   Chloride 109 98 - 111 mmol/L   CO2 19 (L) 22 - 32 mmol/L   Glucose, Bld 84 70 - 99 mg/dL   BUN 6 6 - 20 mg/dL   Creatinine, Ser 4.090.78 0.44 - 1.00 mg/dL   Calcium 9.1 8.9 - 81.110.3 mg/dL   Total Protein 6.3 (L) 6.5 - 8.1 g/dL   Albumin 2.7 (L) 3.5 - 5.0 g/dL   AST 14 (L) 15 - 41 U/L   ALT 10 0 - 44 U/L   Alkaline Phosphatase 74 38 - 126 U/L   Total Bilirubin 0.7 0.3 - 1.2 mg/dL   GFR calc non Af Amer >60 >60 mL/min   GFR calc Af Amer >60 >60 mL/min   Anion gap 7 5 - 15   [place] Mfm Fetal Bpp Wo Non Stress  Result Date:  01/17/2019 ----------------------------------------------------------------------  OBSTETRICS REPORT                       (Signed Final 01/17/2019 02:57 pm) ---------------------------------------------------------------------- Patient Info  ID #:       [id number]                          D.O.B.:  August 25, 1990 (28 yrs)  Name:       Lindsay RichterWANDRA A Siemon                  Visit Date: 01/17/2019 01:49 pm ---------------------------------------------------------------------- Performed By  Performed By:     Emeline DarlingKasie E Kiser BS,      Referred By:      MAU Nursing-                    RDMS                                     MAU/Triage  Attending:        Noralee Spaceavi Shankar MD        [facility] ---------------------------------------------------------------------- Orders   #  Description                          Code         Ordered By   1  US MFM FETAL BPP WO NON              [id number]     Khamia Stambaugh      STRESS  ----------------------------------------------------------------------   #  Order #                    Accession #                 Episode #   1  [id number]                  [phone]                  [id number]  ---------------------------------------------------------------------- Indications   [redacted] weeks gestation of pregnancy                Z3A.36   Maternal morbid obesity                        O99.210 E66.01   Non-reactive NST, FHR decelerations            O28.9  ---------------------------------------------------------------------- Vital Signs  Weight (lb): 314                               Height:        5'3"  BMI:         55.62 ---------------------------------------------------------------------- Fetal Evaluation  Num Of Fetuses:         1  Fetal Heart Rate(bpm):  132  Cardiac Activity:       Observed  Presentation:           Cephalic  Amniotic Fluid  AFI FV:      Within normal limits  AFI Sum(cm)     %Tile       Largest  Pocket(cm)  14.72           54          5.05  RUQ(cm)       RLQ(cm)       LUQ(cm)        LLQ(cm)  5.05          3.05          2.32           4.3 ---------------------------------------------------------------------- Biophysical Evaluation  Amniotic F.V:   Pocket => 2 cm two         F. Tone:        Observed                  planes  F. Movement:    Observed                   Score:          8/8  F. Breathing:   Observed ---------------------------------------------------------------------- OB History  Gravidity:    1 ---------------------------------------------------------------------- Gestational Age  LMP:           36w 0d        Date:  05/10/18                 EDD:   02/14/19  Best:          36w 0d     Det. By:  LMP  (05/10/18)          EDD:   02/14/19 ---------------------------------------------------------------------- Impression  Amniotic fluid is normal and good fetal activity is seen.  Antenatal testing is reassuring. BPP 8/8. ----------------------------------------------------------------------                  Noralee Spaceavi Shankar, MD Electronically Signed Final Report  01/17/2019 02:57 pm ----------------------------------------------------------------------   A: 1. Gestational hypertension, third trimester  -No severe range BPs today & headache completely resolved after 1 dose of tylenol  -PEC labs normal -pt to keep scheduled induction at 37 wks   2. [redacted] weeks gestation of pregnancy   3. Maternal care for fetal decelerations during pregnancy  -prolonged decel after patient returning from the bathroom. BPP 8/8 & reactive tracing afterwards x 1 hour -tracing reviewed with Dr. Rip Harbour     P:  Discharge home in stable condition per consult with Dr. Rip Harbour  Preeclampsia precautions. Keep scheduled appt for IOL Return to maternity admissions as needed in emergencies  Jorje Guild, NP 01/17/2019 3:22 PM

## 2019-01-18 ENCOUNTER — Encounter (HOSPITAL_COMMUNITY): Payer: Self-pay | Admitting: *Deleted

## 2019-01-18 ENCOUNTER — Telehealth (HOSPITAL_COMMUNITY): Payer: Self-pay | Admitting: *Deleted

## 2019-01-18 NOTE — Telephone Encounter (Signed)
Preadmission screen  

## 2019-01-19 ENCOUNTER — Other Ambulatory Visit: Payer: Self-pay | Admitting: Obstetrics and Gynecology

## 2019-01-22 ENCOUNTER — Other Ambulatory Visit (HOSPITAL_COMMUNITY)
Admission: RE | Admit: 2019-01-22 | Discharge: 2019-01-22 | Disposition: A | Discharge status: Home / Self Care | Payer: Medicaid Other | Source: Ambulatory Visit | Attending: Obstetrics & Gynecology | Admitting: Obstetrics & Gynecology

## 2019-01-22 ENCOUNTER — Other Ambulatory Visit: Payer: Self-pay

## 2019-01-22 DIAGNOSIS — Z20828 Contact with and (suspected) exposure to other viral communicable diseases: Secondary | ICD-10-CM | POA: Insufficient documentation

## 2019-01-22 DIAGNOSIS — Z01812 Encounter for preprocedural laboratory examination: Secondary | ICD-10-CM | POA: Insufficient documentation

## 2019-01-22 LAB — SARS CORONAVIRUS 2 (TAT 6-24 HRS): SARS Coronavirus 2: NEGATIVE

## 2019-01-22 NOTE — MAU Note (Signed)
Asymptomatic, swab collected. 

## 2019-01-24 ENCOUNTER — Inpatient Hospital Stay (HOSPITAL_COMMUNITY)
Admission: AD | Admit: 2019-01-24 | Discharge: 2019-01-30 | DRG: 788 | Disposition: A | Discharge status: Home / Self Care | Payer: Medicaid Other | Attending: Obstetrics and Gynecology | Admitting: Obstetrics and Gynecology

## 2019-01-24 ENCOUNTER — Inpatient Hospital Stay (HOSPITAL_COMMUNITY): Payer: Medicaid Other

## 2019-01-24 ENCOUNTER — Other Ambulatory Visit: Payer: Self-pay

## 2019-01-24 ENCOUNTER — Encounter (HOSPITAL_COMMUNITY): Payer: Self-pay

## 2019-01-24 DIAGNOSIS — O99344 Other mental disorders complicating childbirth: Secondary | ICD-10-CM | POA: Diagnosis present

## 2019-01-24 DIAGNOSIS — O139 Gestational [pregnancy-induced] hypertension without significant proteinuria, unspecified trimester: Secondary | ICD-10-CM

## 2019-01-24 DIAGNOSIS — O133 Gestational [pregnancy-induced] hypertension without significant proteinuria, third trimester: Secondary | ICD-10-CM | POA: Diagnosis present

## 2019-01-24 DIAGNOSIS — F4310 Post-traumatic stress disorder, unspecified: Secondary | ICD-10-CM | POA: Diagnosis present

## 2019-01-24 DIAGNOSIS — O9962 Diseases of the digestive system complicating childbirth: Secondary | ICD-10-CM | POA: Diagnosis present

## 2019-01-24 DIAGNOSIS — D560 Alpha thalassemia: Secondary | ICD-10-CM | POA: Diagnosis present

## 2019-01-24 DIAGNOSIS — K219 Gastro-esophageal reflux disease without esophagitis: Secondary | ICD-10-CM | POA: Diagnosis present

## 2019-01-24 DIAGNOSIS — O321XX0 Maternal care for breech presentation, not applicable or unspecified: Secondary | ICD-10-CM | POA: Diagnosis present

## 2019-01-24 DIAGNOSIS — O134 Gestational [pregnancy-induced] hypertension without significant proteinuria, complicating childbirth: Principal | ICD-10-CM | POA: Diagnosis present

## 2019-01-24 DIAGNOSIS — O99214 Obesity complicating childbirth: Secondary | ICD-10-CM | POA: Diagnosis present

## 2019-01-24 DIAGNOSIS — Z3A37 37 weeks gestation of pregnancy: Secondary | ICD-10-CM

## 2019-01-24 DIAGNOSIS — F209 Schizophrenia, unspecified: Secondary | ICD-10-CM | POA: Diagnosis present

## 2019-01-24 DIAGNOSIS — Z98891 History of uterine scar from previous surgery: Secondary | ICD-10-CM

## 2019-01-24 LAB — COMPREHENSIVE METABOLIC PANEL
ALT: 11 U/L (ref 0–44)
AST: 14 U/L — ABNORMAL LOW (ref 15–41)
Albumin: 2.7 g/dL — ABNORMAL LOW (ref 3.5–5.0)
Alkaline Phosphatase: 88 U/L (ref 38–126)
Anion gap: 8 (ref 5–15)
BUN: 8 mg/dL (ref 6–20)
CO2: 22 mmol/L (ref 22–32)
Calcium: 9.1 mg/dL (ref 8.9–10.3)
Chloride: 106 mmol/L (ref 98–111)
Creatinine, Ser: 0.74 mg/dL (ref 0.44–1.00)
GFR calc Af Amer: >60 mL/min (ref >60)
GFR calc non Af Amer: >60 mL/min (ref >60)
Glucose, Bld: 90 mg/dL (ref 70–99)
Potassium: 4.2 mmol/L (ref 3.5–5.1)
Sodium: 136 mmol/L (ref 135–145)
Total Bilirubin: 0.6 mg/dL (ref 0.3–1.2)
Total Protein: 6.6 g/dL (ref 6.5–8.1)

## 2019-01-24 LAB — PROTEIN / CREATININE RATIO, URINE
Creatinine, Urine: 130.6 mg/dL
Protein Creatinine Ratio: 0.2 mg/mg{Cre} — ABNORMAL HIGH (ref 0.00–0.15)
Total Protein, Urine: 26 mg/dL

## 2019-01-24 LAB — CBC
HCT: 36 % (ref 36.0–46.0)
Hemoglobin: 11.2 g/dL — ABNORMAL LOW (ref 12.0–15.0)
MCH: 21.4 pg — ABNORMAL LOW (ref 26.0–34.0)
MCHC: 31.1 g/dL (ref 30.0–36.0)
MCV: 68.7 fL — ABNORMAL LOW (ref 80.0–100.0)
Platelets: 274 10*3/uL (ref 150–400)
RBC: 5.24 MIL/uL — ABNORMAL HIGH (ref 3.87–5.11)
RDW: 15.4 % (ref 11.5–15.5)
WBC: 12.3 10*3/uL — ABNORMAL HIGH (ref 4.0–10.5)
nRBC: 0 % (ref 0.0–0.2)

## 2019-01-24 LAB — URIC ACID: Uric Acid, Serum: 3.7 mg/dL (ref 2.5–7.1)

## 2019-01-24 LAB — TYPE AND SCREEN
ABO/RH(D): A POS
Antibody Screen: NEGATIVE

## 2019-01-24 LAB — OB RESULTS CONSOLE GBS: GBS: NEGATIVE

## 2019-01-24 LAB — ABO/RH: ABO/RH(D): A POS

## 2019-01-24 MED ORDER — FLEET ENEMA 7-19 GM/118ML RE ENEM: 1.0000 | ENEMA | RECTAL | Status: DC | PRN

## 2019-01-24 MED ORDER — FENTANYL CITRATE (PF) 100 MCG/2ML IJ SOLN
50.0000 ug | INTRAMUSCULAR | Status: DC | PRN
  Administered 2019-01-24: 50 ug via INTRAVENOUS
  Filled 2019-01-24: qty 2

## 2019-01-24 MED ORDER — TERBUTALINE SULFATE 1 MG/ML IJ SOLN: 0.2500 mg | Freq: Once | INTRAMUSCULAR | Status: DC | PRN

## 2019-01-24 MED ORDER — ACETAMINOPHEN 325 MG PO TABS
650.0000 mg | ORAL_TABLET | ORAL | Status: DC | PRN
  Administered 2019-01-25: 650 mg via ORAL
  Filled 2019-01-24: qty 2

## 2019-01-24 MED ORDER — ONDANSETRON HCL 4 MG/2ML IJ SOLN
4.0000 mg | Freq: Four times a day (QID) | INTRAMUSCULAR | Status: DC | PRN
  Administered 2019-01-25: 4 mg via INTRAVENOUS
  Filled 2019-01-24: qty 2

## 2019-01-24 MED ORDER — OXYTOCIN 40 UNITS IN NORMAL SALINE INFUSION - SIMPLE MED: 1.0000 m[IU]/min | INTRAVENOUS | Status: DC

## 2019-01-24 MED ORDER — LIDOCAINE HCL (PF) 1 % IJ SOLN: 30.0000 mL | INTRAMUSCULAR | Status: DC | PRN

## 2019-01-24 MED ORDER — FAMOTIDINE 20 MG PO TABS
20.0000 mg | ORAL_TABLET | Freq: Two times a day (BID) | ORAL | Status: DC
  Administered 2019-01-24 (×2): 20 mg via ORAL
  Filled 2019-01-24 (×2): qty 1

## 2019-01-24 MED ORDER — MISOPROSTOL 25 MCG QUARTER TABLET
25.0000 ug | ORAL_TABLET | ORAL | Status: DC
  Administered 2019-01-24: 16:00:00 25 ug via VAGINAL
  Filled 2019-01-24: qty 1

## 2019-01-24 MED ORDER — OXYTOCIN BOLUS FROM INFUSION: 500.0000 mL | Freq: Once | INTRAVENOUS | Status: DC

## 2019-01-24 MED ORDER — OXYTOCIN 40 UNITS IN NORMAL SALINE INFUSION - SIMPLE MED: 2.5000 [IU]/h | INTRAVENOUS | Status: DC

## 2019-01-24 MED ORDER — LACTATED RINGERS IV SOLN
INTRAVENOUS | Status: DC
  Administered 2019-01-24 – 2019-01-25 (×2): via INTRAVENOUS

## 2019-01-24 MED ORDER — OXYTOCIN 40 UNITS IN NORMAL SALINE INFUSION - SIMPLE MED
1.0000 m[IU]/min | INTRAVENOUS | Status: DC
  Administered 2019-01-24: 1 m[IU]/min via INTRAVENOUS
  Filled 2019-01-24: qty 1000

## 2019-01-24 MED ORDER — LACTATED RINGERS IV SOLN
500.0000 mL | INTRAVENOUS | Status: DC | PRN
  Administered 2019-01-24 – 2019-01-25 (×3): 500 mL via INTRAVENOUS

## 2019-01-24 MED ORDER — SOD CITRATE-CITRIC ACID 500-334 MG/5ML PO SOLN
30.0000 mL | ORAL | Status: DC | PRN
  Administered 2019-01-25: 30 mL via ORAL
  Filled 2019-01-24: qty 30

## 2019-01-24 NOTE — H&P (Signed)
Lindsay [name] is a 28 y.o. female presenting for induction of labor d/t sporadically mildly elevated BPs dx'd with GHTN.  Pt has been to the MAU several times recently and evaluated.  Pt denies any ctxs, lof, vb, abdominal pain, HA or visual changes and reports +FM.     OB History    Gravida  1   Para      Term      Preterm      AB      Living        SAB      TAB      Ectopic      Multiple      Live Births             Past Medical History:  Diagnosis Date  . Anemia   . Asthma   . Homozygous alpha thalassemia ([place])   . Hx of chlamydia infection   . Hx of scoliosis   . Pregnancy induced hypertension   . Schizophrenia [facility])    Past Surgical History:  Procedure Laterality Date  . TONSILLECTOMY     Family History: family history includes Cancer in her father and maternal grandmother; Diabetes in her maternal grandmother; Hypertension in her mother. Social History:  reports that she has never smoked. She has never used smokeless tobacco. She reports that she does not drink alcohol or use drugs.     Maternal Diabetes: No although GCT was elevated and GTT neg Genetic Screening: Normal low risk female Maternal Ultrasounds/Referrals: Normal Fetal Ultrasounds or other Referrals:  None Maternal Substance Abuse:  No Significant Maternal Medications:  None Significant Maternal Lab Results:  Group B Strep negative Other Comments:  homozygous alpha thalassemia  ROS  Non-contributory, denies F/C/N/VD, reports reflux History   Last menstrual period 05/10/2018. Exam Physical Exam  Lungs CTA CV RRR Abd gravid, NT Ext no calf tenderness VE c/l/posterior Bedside u/s - vtx FHT 140-150, mod variability, + accels, rare decel Toco rare ctx  Prenatal labs: ABO, Rh: A/Positive/-- (02/11 0000) Antibody: Negative (02/11 0000) Rubella: Immune (02/11 0000) RPR: Nonreactive (02/11 0000)  HBsAg: Negative (02/11 0000)  HIV: Non-reactive (02/11 0000)  GBS:    Negative  Assessment/Plan: P0 at 37wks admitted for induction d/t GHTN.  Shortly after patient was put on the monitor, she had a decel with a contraction while on her back.  Pt turned to her left side.  Tracing returned to cat 1.  Instructed the nurse to observe tracing for at least an hour after starting IVF and if still cat 1, can place 1st cytotec.  Pt given pepcid for reflux.  GHTN labs ordered as well.  Delice Lesch 01/24/2019, 9:34 AM  1823 tracing overall cat 1 with occas ctx.  Still with infrequent decel with return to cat 1 and moderate variability.  I will inform Dr. Charlesetta Garibaldi at sign out and she can decide if she would like 2nd dose of cytotec due to be placed at [place] or if she would prefer pitocin.  Labs neg for preeclampsia.

## 2019-01-25 ENCOUNTER — Encounter (HOSPITAL_COMMUNITY)
Admission: AD | Disposition: A | Discharge status: Home / Self Care | Payer: Self-pay | Source: Home / Self Care | Attending: Obstetrics and Gynecology

## 2019-01-25 ENCOUNTER — Inpatient Hospital Stay (HOSPITAL_COMMUNITY): Payer: Medicaid Other | Admitting: Anesthesiology

## 2019-01-25 LAB — CBC
HCT: 37.1 % (ref 36.0–46.0)
Hemoglobin: 11.3 g/dL — ABNORMAL LOW (ref 12.0–15.0)
MCH: 21.2 pg — ABNORMAL LOW (ref 26.0–34.0)
MCHC: 30.5 g/dL (ref 30.0–36.0)
MCV: 69.6 fL — ABNORMAL LOW (ref 80.0–100.0)
Platelets: 250 10*3/uL (ref 150–400)
RBC: 5.33 MIL/uL — ABNORMAL HIGH (ref 3.87–5.11)
RDW: 15.6 % — ABNORMAL HIGH (ref 11.5–15.5)
WBC: 11.6 10*3/uL — ABNORMAL HIGH (ref 4.0–10.5)
nRBC: 0 % (ref 0.0–0.2)

## 2019-01-25 LAB — RPR: RPR Ser Ql: NONREACTIVE

## 2019-01-25 SURGERY — Surgical Case
Anesthesia: Epidural | Wound class: Clean Contaminated

## 2019-01-25 MED ORDER — FENTANYL CITRATE (PF) 100 MCG/2ML IJ SOLN
INTRAMUSCULAR | Status: AC
  Filled 2019-01-25: qty 2

## 2019-01-25 MED ORDER — SODIUM CHLORIDE (PF) 0.9 % IJ SOLN
INTRAMUSCULAR | Status: DC | PRN
  Administered 2019-01-25: 12 mL/h via EPIDURAL

## 2019-01-25 MED ORDER — PHENYLEPHRINE 40 MCG/ML (10ML) SYRINGE FOR IV PUSH (FOR BLOOD PRESSURE SUPPORT)
80.0000 ug | PREFILLED_SYRINGE | INTRAVENOUS | Status: DC | PRN
  Filled 2019-01-25: qty 10

## 2019-01-25 MED ORDER — ONDANSETRON HCL 4 MG/2ML IJ SOLN
INTRAMUSCULAR | Status: AC
  Filled 2019-01-25: qty 2

## 2019-01-25 MED ORDER — LIDOCAINE-EPINEPHRINE (PF) 2 %-1:200000 IJ SOLN
INTRAMUSCULAR | Status: DC | PRN
  Administered 2019-01-25: 3 mL via EPIDURAL
  Administered 2019-01-25: 10 mL via EPIDURAL
  Administered 2019-01-25: 3 mL via EPIDURAL

## 2019-01-25 MED ORDER — EPHEDRINE 5 MG/ML INJ: 10.0000 mg | INTRAVENOUS | Status: DC | PRN

## 2019-01-25 MED ORDER — MORPHINE SULFATE (PF) 0.5 MG/ML IJ SOLN
INTRAMUSCULAR | Status: AC
  Filled 2019-01-25: qty 10

## 2019-01-25 MED ORDER — DEXAMETHASONE SODIUM PHOSPHATE 10 MG/ML IJ SOLN
INTRAMUSCULAR | Status: AC
  Filled 2019-01-25: qty 1

## 2019-01-25 MED ORDER — BUPIVACAINE HCL (PF) 0.25 % IJ SOLN
INTRAMUSCULAR | Status: AC
  Filled 2019-01-25: qty 30

## 2019-01-25 MED ORDER — OXYTOCIN 40 UNITS IN NORMAL SALINE INFUSION - SIMPLE MED: 1.0000 m[IU]/min | INTRAVENOUS | Status: DC

## 2019-01-25 MED ORDER — FENTANYL-BUPIVACAINE-NACL 0.5-0.125-0.9 MG/250ML-% EP SOLN
12.0000 mL/h | EPIDURAL | Status: DC | PRN
  Filled 2019-01-25: qty 250

## 2019-01-25 MED ORDER — DIPHENHYDRAMINE HCL 50 MG/ML IJ SOLN: 12.5000 mg | INTRAMUSCULAR | Status: DC | PRN

## 2019-01-25 MED ORDER — LACTATED RINGERS IV SOLN
500.0000 mL | Freq: Once | INTRAVENOUS | Status: AC
  Administered 2019-01-25: 500 mL via INTRAVENOUS

## 2019-01-25 MED ORDER — PHENYLEPHRINE 40 MCG/ML (10ML) SYRINGE FOR IV PUSH (FOR BLOOD PRESSURE SUPPORT): 80.0000 ug | PREFILLED_SYRINGE | INTRAVENOUS | Status: DC | PRN

## 2019-01-25 MED ORDER — OXYTOCIN 40 UNITS IN NORMAL SALINE INFUSION - SIMPLE MED
INTRAVENOUS | Status: AC
  Filled 2019-01-25: qty 1000

## 2019-01-25 SURGICAL SUPPLY — 41 items
APL SKNCLS STERI-STRIP NONHPOA (GAUZE/BANDAGES/DRESSINGS) ×1
BENZOIN TINCTURE PRP APPL 2/3 (GAUZE/BANDAGES/DRESSINGS) ×2 IMPLANT
CHLORAPREP W/TINT 26ML (MISCELLANEOUS) ×2 IMPLANT
CLAMP CORD UMBIL (MISCELLANEOUS) IMPLANT
CLOTH BEACON ORANGE TIMEOUT ST (SAFETY) ×2 IMPLANT
DRAIN JACKSON PRT FLT 10 (DRAIN) IMPLANT
DRAIN JACKSON PRT FLT 7MM (DRAIN) ×1 IMPLANT
DRSG OPSITE POSTOP 4X10 (GAUZE/BANDAGES/DRESSINGS) ×2 IMPLANT
ELECT REM PT RETURN 9FT ADLT (ELECTROSURGICAL) ×2
ELECTRODE REM PT RTRN 9FT ADLT (ELECTROSURGICAL) ×1 IMPLANT
EVACUATOR SILICONE 100CC (DRAIN) IMPLANT
EXTRACTOR VACUUM M CUP 4 TUBE (SUCTIONS) IMPLANT
GLOVE BIO SURGEON STRL SZ 6.5 (GLOVE) ×2 IMPLANT
GLOVE BIOGEL PI IND STRL 7.0 (GLOVE) ×2 IMPLANT
GLOVE BIOGEL PI INDICATOR 7.0 (GLOVE) ×2
GOWN STRL REUS W/TWL LRG LVL3 (GOWN DISPOSABLE) ×4 IMPLANT
HOVERMATT SINGLE USE (MISCELLANEOUS) ×1 IMPLANT
KIT ABG SYR 3ML LUER SLIP (SYRINGE) IMPLANT
NDL HYPO 25X5/8 SAFETYGLIDE (NEEDLE) IMPLANT
NEEDLE HYPO 25X5/8 SAFETYGLIDE (NEEDLE) IMPLANT
NS IRRIG 1000ML POUR BTL (IV SOLUTION) ×2 IMPLANT
PACK C SECTION WH (CUSTOM PROCEDURE TRAY) ×2 IMPLANT
PAD OB MATERNITY 4.3X12.25 (PERSONAL CARE ITEMS) ×2 IMPLANT
PENCIL SMOKE EVAC W/HOLSTER (ELECTROSURGICAL) ×2 IMPLANT
RETRACTOR TRAXI PANNICULUS (MISCELLANEOUS) IMPLANT
RTRCTR C-SECT PINK 25CM LRG (MISCELLANEOUS) IMPLANT
STRIP CLOSURE SKIN 1/2X4 (GAUZE/BANDAGES/DRESSINGS) ×2 IMPLANT
SUT CHROMIC 0 CT 1 (SUTURE) ×2 IMPLANT
SUT MNCRL AB 3-0 PS2 27 (SUTURE) ×2 IMPLANT
SUT PLAIN 2 0 (SUTURE) ×4
SUT PLAIN 2 0 XLH (SUTURE) ×2 IMPLANT
SUT PLAIN ABS 2-0 CT1 27XMFL (SUTURE) ×2 IMPLANT
SUT SILK 2 0 SH (SUTURE) IMPLANT
SUT VIC AB 0 CTX 36 (SUTURE) ×8
SUT VIC AB 0 CTX36XBRD ANBCTRL (SUTURE) ×4 IMPLANT
SUT VIC AB 2-0 SH 27 (SUTURE)
SUT VIC AB 2-0 SH 27XBRD (SUTURE) IMPLANT
TOWEL OR 17X24 6PK STRL BLUE (TOWEL DISPOSABLE) ×2 IMPLANT
TRAXI PANNICULUS RETRACTOR (MISCELLANEOUS) ×1
TRAY FOLEY W/BAG SLVR 14FR LF (SET/KITS/TRAYS/PACK) ×2 IMPLANT
WATER STERILE IRR 1000ML POUR (IV SOLUTION) ×2 IMPLANT

## 2019-01-25 NOTE — Progress Notes (Addendum)
Patient ID: Lindsay [name], female   DOB: March 02, 1991, 28 y.o.   MRN: [id number]  Pt denies any GHTN sxs S/p epidural with some relief VSS cvx 1/long and mid-position Foley placed and balloon insufflated with 60cc LR FHT cat 1 overall, occas episodes of cat 2 with variable decelerations (reviewed tracing remotely also at about 0900 and cat 1 overall) Will hold pitocin at 69mu/min.  (Spoke with nurse this morning at [phone] and asked her to leave pitocin at 22mu/min until I come to evaluate pt - informed tracing overall cat 1 at that time) Cont to observe

## 2019-01-25 NOTE — Anesthesia Preprocedure Evaluation (Signed)
Anesthesia Evaluation  Patient identified by MRN, date of birth, ID band Patient awake    Reviewed: Allergy & Precautions, NPO status , Patient's Chart, lab work & pertinent test results  Airway Mallampati: III  TM Distance: >3 FB Neck ROM: Full    Dental no notable dental hx.    Pulmonary asthma ,    Pulmonary exam normal breath sounds clear to auscultation       Cardiovascular hypertension, Pt. on medications Normal cardiovascular exam Rhythm:Regular Rate:Normal     Neuro/Psych PSYCHIATRIC DISORDERS Schizophrenia negative neurological ROS     GI/Hepatic negative GI ROS, Neg liver ROS,   Endo/Other  Morbid obesity  Renal/GU negative Renal ROS  negative genitourinary   Musculoskeletal negative musculoskeletal ROS (+)   Abdominal   Peds  Hematology  (+) Blood dyscrasia (Hgb 11.3), anemia ,   Anesthesia Other Findings   Reproductive/Obstetrics (+) Pregnancy                             Anesthesia Physical Anesthesia Plan  ASA: III  Anesthesia Plan: Epidural   Post-op Pain Management:    Induction:   PONV Risk Score and Plan: Treatment may vary due to age or medical condition  Airway Management Planned: Natural Airway  Additional Equipment:   Intra-op Plan:   Post-operative Plan:   Informed Consent: I have reviewed the patients History and Physical, chart, labs and discussed the procedure including the risks, benefits and alternatives for the proposed anesthesia with the patient or authorized representative who has indicated his/her understanding and acceptance.       Plan Discussed with: Anesthesiologist  Anesthesia Plan Comments: (Patient identified. Risks, benefits, options discussed with patient including but not limited to bleeding, infection, nerve damage, paralysis, failed block, incomplete pain control, headache, blood pressure changes, nausea, vomiting, reactions  to medication, itching, and post partum back pain. Confirmed with bedside nurse the patient's most recent platelet count. Confirmed with the patient that they are not taking any anticoagulation, have any bleeding history or any family history of bleeding disorders. Patient expressed understanding and wishes to proceed. All questions were answered. )        Anesthesia Quick Evaluation

## 2019-01-25 NOTE — Progress Notes (Signed)
Subjective: Comfortable with epidural  Objective: BP (!) 148/77   Pulse 84   Temp 99.1 F (37.3 C) (Axillary)   Resp 16   Ht 5\' 3"  (1.6 m)   Wt (!) 142.8 kg   LMP 05/10/2018   SpO2 100%   BMI 55.77 kg/m  I/O last 3 completed shifts: In: -  Out: 1050 [Urine:1050] No intake/output data recorded.   FHT: Category 1 UC:   irregular, every 5 minutes SVE:   Dilation: 4 Effacement (%): 70 Station: -2 Exam by:: stone rnc Pitocin at 1 Arom clear fluid IUPC placed without difficulty.  Scalp placed but picking up artifact.    Assessment:  Induction of labor due to gestational hypertension at 37 weeks Cat 1 strip Morbid obesity  Plan: Monitor progress Restart pitocin.  Starla Link CNM, MSN 01/25/2019, 8:16 PM

## 2019-01-25 NOTE — Progress Notes (Signed)
Subjective: Pt comfortable with epidural.  Husband in room.  Objective: BP 128/67   Pulse 88   Temp 98.6 F (37 C) (Oral)   Resp 16   Ht 5\' 3"  (1.6 m)   Wt (!) 142.8 kg   LMP 05/10/2018   SpO2 100%   BMI 55.77 kg/m  I/O last 3 completed shifts: In: -  Out: 1050 [Urine:1050] Total I/O In: -  Out: 250 [Urine:250]  FHT: Category 2 Fhts 150 decreased variability late decels with contractions  UC:   irregular, every 5 minutes SVE:   Dilation: 4 Effacement (%): 70 Station: -2 Exam by:: [place] CNM Pitocin at none MVUs 35  Assessment:  Induction of labor due togestational hypertension at 37 weeks Cat 2 strip Morbid obesity  Plan: Fetal intolerance to labor, unable to start pitocin to have adequate contractions, Discussed with patient to proceed with LTCS, risk and benefits discussed.  Patient verbalized understanding.   Dr. Charlesetta Garibaldi called and on her way in   [place], MSN 01/25/2019, 10:58 PM

## 2019-01-25 NOTE — Anesthesia Procedure Notes (Signed)
Epidural Patient location during procedure: OB Start time: 01/25/2019 10:45 AM End time: 01/25/2019 11:15 AM  Staffing Anesthesiologist: Freddrick March, MD Performed: anesthesiologist   Preanesthetic Checklist Completed: patient identified, pre-op evaluation, timeout performed, IV checked, risks and benefits discussed and monitors and equipment checked  Epidural Patient position: sitting Prep: site prepped and draped and DuraPrep Patient monitoring: continuous pulse ox, blood pressure, heart rate and cardiac monitor Approach: midline Location: L3-L4 Injection technique: LOR air  Needle:  Needle type: Tuohy  Needle gauge: 17 G Needle length: 9 cm Needle insertion depth: 12 cm Catheter type: closed end flexible Catheter size: 19 Gauge Catheter at skin depth: 17 cm Test dose: negative  Assessment Sensory level: T8 Events: blood not aspirated, injection not painful, no injection resistance, negative IV test and no paresthesia  Additional Notes Patient identified. Risks/Benefits/Options discussed with patient including but not limited to bleeding, infection, nerve damage, paralysis, failed block, incomplete pain control, headache, blood pressure changes, nausea, vomiting, reactions to medication both or allergic, itching and postpartum back pain. Confirmed with bedside nurse the patient's most recent platelet count. Confirmed with patient that they are not currently taking any anticoagulation, have any bleeding history or any family history of bleeding disorders. Patient expressed understanding and wished to proceed. All questions were answered. Sterile technique was used throughout the entire procedure. Please see nursing notes for vital signs. Test dose was given through epidural catheter and negative prior to continuing to dose epidural or start infusion. Warning signs of high block given to the patient including shortness of breath, tingling/numbness in hands, complete motor block,  or any concerning symptoms with instructions to call for help. Patient was given instructions on fall risk and not to get out of bed. All questions and concerns addressed with instructions to call with any issues or inadequate analgesia.  Reason for block:procedure for pain

## 2019-01-25 NOTE — Progress Notes (Signed)
Provided pt's nurse with advance directive information to give to pt.  If she wishes to fill this out while she is here, please page [place] at [phone].  Chaplain Janne Napoleon, Bcc 4:38 PM

## 2019-01-25 NOTE — Progress Notes (Addendum)
Lindsay [name] is a 28 y.o. G1P0 at [redacted]w[redacted]d  Subjective: Epidural replaced now.  Lots of LOC during.  Pt was not comfortable after epidural placement initially this morning.  Objective: BP (!) 122/56   Pulse 80   Temp 98.6 F (37 C) (Oral)   Resp 16   Ht 5\' 3"  (1.6 m)   Wt (!) 142.8 kg   LMP 05/10/2018   SpO2 100%   BMI 55.77 kg/m  No intake/output data recorded. No intake/output data recorded.  FHT:  FHR: 145 - 155 bpm, variability: min-mod,  accelerations:  Abscent,  decelerations:  Absent UC:   Unsure but RN reports about q3-5 SVE:   Dilation:1cm Effacement (%): Thick Station: -3 Exam by:: Dr. Mancel Bale (at time of foley placement)  Labs: Lab Results  Component Value Date   WBC 11.6 (H) 01/25/2019   HGB 11.3 (L) 01/25/2019   HCT 37.1 01/25/2019   MCV 69.6 (L) 01/25/2019   PLT 250 01/25/2019    Assessment / Plan: Induction of labor due to gestational hypertension,  progressing well on pitocin  Labor: Progressing on Pitocin, will continue to increase then AROM and foley placed earlier today and pt on 39mu/min of pitocin.  Will observe at this dose.  Foley to stay for 12hrs unless comes out sooner or indication to remove sooner.  Discussed POC with Roselle Locus, RN and will hold here and if variables return or recur, d/c pitocin and just continue foley. Preeclampsia:  no signs or symptoms of toxicity  BPs have been good most of the day until pt started experiencing increased pain after foley placement. Fetal Wellbeing:  Category I, Category II and lots of LOC while getting epidural and some difficulty tracing d/t habitus.  Overall tracing is cat 1 but will d/c pitocin if cat 2 predominates. Pain Control:  Epidural replaced now and pt much more comfortable I/D:  GBS negative Anticipated MOD:  to be determined  Delice Lesch 01/25/2019, 2:00 PM

## 2019-01-25 NOTE — Progress Notes (Addendum)
Patient ID: Lindsay [name], female   DOB: 11-29-1990, 28 y.o.   MRN: [id number]   Tracing remotely reviewed Cat 1 overall but occas episodes of cat 2 with variable/?late or prolonged decel.   Tracing 140, +accels just prior to prolonged decel, no current decels since prolonged at 1710 and mod variability. VSS with mildly elevated BPs and episodic severe range BP when pt was in pain Cont to observe  Update 1806 Called by Roselle Locus, RN at [phone] stating intrauterine foley came out and pt is 5/50/-2, head palpable. Cat 1 and reactive tracing (remotely reviewed by me now). Signing out at 1900 to Irene Shipper, CNM and Dr. Charlesetta Garibaldi with recommendation to AROM and place [facility] because contractions have been difficult to monitor this entire admission d/t habitus.

## 2019-01-26 LAB — COMPREHENSIVE METABOLIC PANEL
ALT: 11 U/L (ref 0–44)
AST: 22 U/L (ref 15–41)
Albumin: 2.5 g/dL — ABNORMAL LOW (ref 3.5–5.0)
Alkaline Phosphatase: 88 U/L (ref 38–126)
Anion gap: 11 (ref 5–15)
BUN: 8 mg/dL (ref 6–20)
CO2: 20 mmol/L — ABNORMAL LOW (ref 22–32)
Calcium: 8.9 mg/dL (ref 8.9–10.3)
Chloride: 102 mmol/L (ref 98–111)
Creatinine, Ser: 0.91 mg/dL (ref 0.44–1.00)
GFR calc Af Amer: >60 mL/min (ref >60)
GFR calc non Af Amer: >60 mL/min (ref >60)
Glucose, Bld: 140 mg/dL — ABNORMAL HIGH (ref 70–99)
Potassium: 4.1 mmol/L (ref 3.5–5.1)
Sodium: 133 mmol/L — ABNORMAL LOW (ref 135–145)
Total Bilirubin: 0.7 mg/dL (ref 0.3–1.2)
Total Protein: 6.1 g/dL — ABNORMAL LOW (ref 6.5–8.1)

## 2019-01-26 LAB — CBC
HCT: 33.7 % — ABNORMAL LOW (ref 36.0–46.0)
Hemoglobin: 10.5 g/dL — ABNORMAL LOW (ref 12.0–15.0)
MCH: 21.5 pg — ABNORMAL LOW (ref 26.0–34.0)
MCHC: 31.2 g/dL (ref 30.0–36.0)
MCV: 69.1 fL — ABNORMAL LOW (ref 80.0–100.0)
Platelets: 246 10*3/uL (ref 150–400)
RBC: 4.88 MIL/uL (ref 3.87–5.11)
RDW: 15.5 % (ref 11.5–15.5)
WBC: 19.5 10*3/uL — ABNORMAL HIGH (ref 4.0–10.5)
nRBC: 0.5 % — ABNORMAL HIGH (ref 0.0–0.2)

## 2019-01-26 MED ORDER — TERBUTALINE SULFATE 1 MG/ML IJ SOLN
INTRAMUSCULAR | Status: AC
  Filled 2019-01-26: qty 1

## 2019-01-26 MED ORDER — ONDANSETRON HCL 4 MG/2ML IJ SOLN
INTRAMUSCULAR | Status: DC | PRN
  Administered 2019-01-26: 4 mg via INTRAVENOUS

## 2019-01-26 MED ORDER — HYDROCODONE-ACETAMINOPHEN 5-325 MG PO TABS
1.0000 | ORAL_TABLET | ORAL | Status: DC | PRN
  Administered 2019-01-27 – 2019-01-28 (×6): 2 via ORAL
  Administered 2019-01-29: 1 via ORAL
  Administered 2019-01-29: 2 via ORAL
  Administered 2019-01-29 – 2019-01-30 (×3): 1 via ORAL
  Filled 2019-01-26 (×2): qty 2
  Filled 2019-01-26 (×2): qty 1
  Filled 2019-01-26 (×7): qty 2

## 2019-01-26 MED ORDER — SODIUM CHLORIDE 0.9 % IV SOLN
INTRAVENOUS | Status: DC | PRN
  Administered 2019-01-26: 40 [IU] via INTRAVENOUS

## 2019-01-26 MED ORDER — DIPHENHYDRAMINE HCL 25 MG PO CAPS: 25.0000 mg | ORAL_CAPSULE | Freq: Four times a day (QID) | ORAL | Status: DC | PRN

## 2019-01-26 MED ORDER — NITROGLYCERIN 0.4 MG/SPRAY TL SOLN
Status: DC | PRN
  Administered 2019-01-25: 2 via SUBLINGUAL

## 2019-01-26 MED ORDER — NITROGLYCERIN 0.4 MG/SPRAY TL SOLN
Status: AC
  Filled 2019-01-26: qty 4.9

## 2019-01-26 MED ORDER — NALBUPHINE HCL 10 MG/ML IJ SOLN: 5.0000 mg | INTRAMUSCULAR | Status: DC | PRN

## 2019-01-26 MED ORDER — SCOPOLAMINE 1 MG/3DAYS TD PT72: 1.0000 | MEDICATED_PATCH | Freq: Once | TRANSDERMAL | Status: DC

## 2019-01-26 MED ORDER — ENOXAPARIN SODIUM 80 MG/0.8ML ~~LOC~~ SOLN
0.5000 mg/kg | SUBCUTANEOUS | Status: DC
  Administered 2019-01-27 – 2019-01-30 (×4): 70 mg via SUBCUTANEOUS
  Filled 2019-01-26 (×4): qty 0.8

## 2019-01-26 MED ORDER — TETANUS-DIPHTH-ACELL PERTUSSIS 5-2.5-18.5 LF-MCG/0.5 IM SUSP: 0.5000 mL | Freq: Once | INTRAMUSCULAR | Status: DC

## 2019-01-26 MED ORDER — NALBUPHINE HCL 10 MG/ML IJ SOLN: 5.0000 mg | Freq: Once | INTRAMUSCULAR | Status: DC | PRN

## 2019-01-26 MED ORDER — MENTHOL 3 MG MT LOZG: 1.0000 | LOZENGE | OROMUCOSAL | Status: DC | PRN

## 2019-01-26 MED ORDER — ONDANSETRON HCL 4 MG/2ML IJ SOLN: 4.0000 mg | Freq: Three times a day (TID) | INTRAMUSCULAR | Status: DC | PRN

## 2019-01-26 MED ORDER — PROMETHAZINE HCL 25 MG/ML IJ SOLN: 6.2500 mg | INTRAMUSCULAR | Status: DC | PRN

## 2019-01-26 MED ORDER — KETOROLAC TROMETHAMINE 30 MG/ML IJ SOLN
30.0000 mg | Freq: Once | INTRAMUSCULAR | Status: AC | PRN
  Administered 2019-01-26: 30 mg via INTRAVENOUS

## 2019-01-26 MED ORDER — ZOLPIDEM TARTRATE 5 MG PO TABS: 5.0000 mg | ORAL_TABLET | Freq: Every evening | ORAL | Status: DC | PRN

## 2019-01-26 MED ORDER — SIMETHICONE 80 MG PO CHEW: 80.0000 mg | CHEWABLE_TABLET | ORAL | Status: DC

## 2019-01-26 MED ORDER — PRENATAL MULTIVITAMIN CH
1.0000 | ORAL_TABLET | Freq: Every day | ORAL | Status: DC
  Filled 2019-01-26 (×2): qty 1

## 2019-01-26 MED ORDER — DEXAMETHASONE SODIUM PHOSPHATE 10 MG/ML IJ SOLN
INTRAMUSCULAR | Status: DC | PRN
  Administered 2019-01-26: 10 mg via INTRAVENOUS

## 2019-01-26 MED ORDER — DEXTROSE 5 % IV SOLN
INTRAVENOUS | Status: DC | PRN
  Administered 2019-01-26: 3 g via INTRAVENOUS

## 2019-01-26 MED ORDER — MORPHINE SULFATE (PF) 0.5 MG/ML IJ SOLN
INTRAMUSCULAR | Status: DC | PRN
  Administered 2019-01-26: 3 mg via EPIDURAL

## 2019-01-26 MED ORDER — DIPHENHYDRAMINE HCL 25 MG PO CAPS: 25.0000 mg | ORAL_CAPSULE | ORAL | Status: DC | PRN

## 2019-01-26 MED ORDER — ACETAMINOPHEN 500 MG PO TABS
1000.0000 mg | ORAL_TABLET | Freq: Four times a day (QID) | ORAL | Status: AC
  Administered 2019-01-26 (×3): 1000 mg via ORAL
  Filled 2019-01-26 (×3): qty 2

## 2019-01-26 MED ORDER — LACTATED RINGERS IV SOLN
INTRAVENOUS | Status: DC
  Administered 2019-01-26: 16:00:00 via INTRAVENOUS

## 2019-01-26 MED ORDER — SIMETHICONE 80 MG PO CHEW: 80.0000 mg | CHEWABLE_TABLET | Freq: Three times a day (TID) | ORAL | Status: DC

## 2019-01-26 MED ORDER — KETOROLAC TROMETHAMINE 30 MG/ML IJ SOLN
30.0000 mg | Freq: Four times a day (QID) | INTRAMUSCULAR | Status: AC | PRN
  Administered 2019-01-26 – 2019-01-27 (×3): 30 mg via INTRAVENOUS
  Filled 2019-01-26 (×3): qty 1

## 2019-01-26 MED ORDER — DIBUCAINE (PERIANAL) 1 % EX OINT: 1.0000 "application " | TOPICAL_OINTMENT | CUTANEOUS | Status: DC | PRN

## 2019-01-26 MED ORDER — MEPERIDINE HCL 25 MG/ML IJ SOLN: 6.2500 mg | INTRAMUSCULAR | Status: DC | PRN

## 2019-01-26 MED ORDER — FENTANYL CITRATE (PF) 100 MCG/2ML IJ SOLN
INTRAMUSCULAR | Status: DC | PRN
  Administered 2019-01-25: 100 ug via EPIDURAL

## 2019-01-26 MED ORDER — NALOXONE HCL 4 MG/10ML IJ SOLN
1.0000 ug/kg/h | INTRAVENOUS | Status: DC | PRN
  Filled 2019-01-26: qty 5

## 2019-01-26 MED ORDER — KETOROLAC TROMETHAMINE 30 MG/ML IJ SOLN: 30.0000 mg | Freq: Four times a day (QID) | INTRAMUSCULAR | Status: AC | PRN

## 2019-01-26 MED ORDER — WITCH HAZEL-GLYCERIN EX PADS: 1.0000 "application " | MEDICATED_PAD | CUTANEOUS | Status: DC | PRN

## 2019-01-26 MED ORDER — SIMETHICONE 80 MG PO CHEW
80.0000 mg | CHEWABLE_TABLET | ORAL | Status: DC | PRN
  Administered 2019-01-27: 80 mg via ORAL
  Filled 2019-01-26: qty 1

## 2019-01-26 MED ORDER — DEXTROSE 5 % IV SOLN
INTRAVENOUS | Status: AC
  Filled 2019-01-26: qty 3000

## 2019-01-26 MED ORDER — LIDOCAINE-EPINEPHRINE (PF) 2 %-1:200000 IJ SOLN
INTRAMUSCULAR | Status: AC
  Filled 2019-01-26: qty 20

## 2019-01-26 MED ORDER — HYDROMORPHONE HCL 1 MG/ML IJ SOLN: 0.2500 mg | INTRAMUSCULAR | Status: DC | PRN

## 2019-01-26 MED ORDER — DIPHENHYDRAMINE HCL 50 MG/ML IJ SOLN: 12.5000 mg | INTRAMUSCULAR | Status: DC | PRN

## 2019-01-26 MED ORDER — LACTATED RINGERS IV SOLN: INTRAVENOUS | Status: DC

## 2019-01-26 MED ORDER — OXYTOCIN 40 UNITS IN NORMAL SALINE INFUSION - SIMPLE MED: 2.5000 [IU]/h | INTRAVENOUS | Status: AC

## 2019-01-26 MED ORDER — SENNOSIDES-DOCUSATE SODIUM 8.6-50 MG PO TABS
2.0000 | ORAL_TABLET | ORAL | Status: DC
  Administered 2019-01-27 – 2019-01-29 (×4): 2 via ORAL
  Filled 2019-01-26 (×5): qty 2

## 2019-01-26 MED ORDER — NALOXONE HCL 0.4 MG/ML IJ SOLN: 0.4000 mg | INTRAMUSCULAR | Status: DC | PRN

## 2019-01-26 MED ORDER — SODIUM CHLORIDE 0.9 % IV SOLN
INTRAVENOUS | Status: DC | PRN
  Administered 2019-01-26: via INTRAVENOUS

## 2019-01-26 MED ORDER — COCONUT OIL OIL: 1.0000 "application " | TOPICAL_OIL | Status: DC | PRN

## 2019-01-26 MED ORDER — KETOROLAC TROMETHAMINE 30 MG/ML IJ SOLN
INTRAMUSCULAR | Status: AC
  Filled 2019-01-26: qty 1

## 2019-01-26 MED ORDER — SIMETHICONE 80 MG PO CHEW
80.0000 mg | CHEWABLE_TABLET | Freq: Three times a day (TID) | ORAL | Status: DC
  Administered 2019-01-26 – 2019-01-30 (×12): 80 mg via ORAL
  Filled 2019-01-26 (×12): qty 1

## 2019-01-26 MED ORDER — SODIUM CHLORIDE 0.9% FLUSH: 3.0000 mL | INTRAVENOUS | Status: DC | PRN

## 2019-01-26 NOTE — Op Note (Signed)
Cesarean Section Procedure Note   Lindsay [name]  01/24/2019 - 01/25/2019  Indications: NRFHTS   Pre-operative Diagnosis: Unscheduled Cesarean Section, See Delivery Summary.   Post-operative Diagnosis: Same   Surgeon: Surgeon(s) and Role:    * Crawford Givens, MD - Primary   Assistants: N. Prothero   Anesthesia: epidural   Procedure Details:  The patient was seen in the Holding Room. The risks, benefits, complications, treatment options, and expected outcomes were discussed with the patient. The patient concurred with the proposed plan, giving informed consent. identified as [name] and the procedure verified as C-Section Delivery. A Time Out was held and the above information confirmed.  After induction of anesthesia, the patient was draped and prepped in the usual sterile manner. A transverse incision was made and carried down through the subcutaneous tissue to the fascia. Fascial incision was made in the midline and extended transversely. The fascia was separated from the underlying rectus muscle superiorly and inferiorly. The peritoneum was identified and entered. Peritoneal incision was extended longitudinally with good visualization of bowel and bladder. The utero-vesical peritoneal reflection was incised transversely and the bladder flap was bluntly freed from the lower uterine segment.  An alexsis retractor was placed in the abdomen.   A low transverse uterine incision was made. Delivered from cephalic presentation was a  infant, with Apgar scores of 1 at one minute and 9 at five minutes. Cord ph was art 7.028 venous 7.2 the umbilical cord was clamped and cut cord blood was obtained for evaluation. The placenta was removed Intact and appeared normal. The uterine outline, tubes and ovaries appeared normal}. The uterine incision was closed with running locked sutures of 0Vicryl. A second layer 0 vicrlyl was used to imbricate the uterine incision    Hemostasis was observed. Lavage was  carried out until clear. The alexsis was removed.  The peritoneum was closed with 0 chromic.  The muscles were examined and any bleeders were made hemostatic using bovie cautery device.   The fascia was then reapproximated with running sutures of 0 vicryl.  jp drain was placed.  The subcutaneous tissue was reapproximated  With interrupted stitches using 2-0 plain gut. The subcuticular closure was performed using 3-66monocryl     Instrument, sponge, and needle counts were correct prior the abdominal closure and were correct at the conclusion of the case.    Findings: infant was delivered from breech  Presentation. The vacuum was applied to vtx but a good seal was not applied.  The uterine incision was extended bilaterally and nitroglycerine was given to relax the uterus.  At this time I grabbed the babies feet and delivered the by preech manuevers The fluid was clear.  The uterus tubes and ovaries appeared normal.     Estimated Blood Loss:   Total IV Fluids: the anesthesia team had dificulty getting the IV started  Urine Output: 1725CC OF clear urine  Specimens: placenta  Complications: no complications  Disposition: PACU - hemodynamically stable.   Maternal Condition: stable   Baby condition / location:  Couplet care / Skin to Skin  Attending Attestation: I performed the procedure.   Signed: Surgeon(s): Crawford Givens, MD

## 2019-01-26 NOTE — Progress Notes (Signed)
Pt up to void, no results. Uterus firm at umbilicus, small vag bleeding. Pt has had vomiting this am and has just had good PO fluid intake this afternoon. Bladder scan done, but unable to detect bladder, charge nurse at bedside as well, will continue to monitor and have pt attempt to void in an hour.

## 2019-01-26 NOTE — Anesthesia Postprocedure Evaluation (Signed)
Anesthesia Post Note  Patient: Lindsay [name]  Procedure(s) Performed: CESAREAN SECTION (N/A )     Patient location during evaluation: Mother Baby Anesthesia Type: Epidural Level of consciousness: awake Pain management: satisfactory to patient Vital Signs Assessment: post-procedure vital signs reviewed and stable Respiratory status: spontaneous breathing Cardiovascular status: stable Anesthetic complications: no    Last Vitals:  Vitals:   01/26/19 0930 01/26/19 1130  BP: (!) 131/108   [name]: 80   Resp: 18 20  Temp: 36.7 C 36.7 C  SpO2: 100% 100%    Last Pain:  Vitals:   01/26/19 1355  TempSrc:   PainSc: 2    Pain Goal:                   [employer]

## 2019-01-26 NOTE — Lactation Note (Addendum)
This note was copied from a baby's chart. Lactation Consultation Note  Patient Name: Lindsay [name] Date: 01/26/2019 Reason for consult: Initial assessment;Infant < 6lbs;Early term 37-38.6wks;Primapara;1st time breastfeeding  LC visited with P1 Mom of ET infant weighing <6 lbs.  Baby is 10 hrs old.  Baby had a low temp and placed under radiant warmer.  Most recent temp slightly low, room temp raised from 69-72.  Mom holding baby swaddled in her arms.  Baby had received 10 ml of formula by bottle about 2-3 hrs ago.    Reviewed breast massage and hand expression.  Mom has large breasts with erect nipples.  Areola compressible, and an easy flow of colostrum noted.    Pediatrician in to check baby.   Set up DEBP at bedside with instructions on benefit of extra stimulation.  LPTI guidelines given to Mom, and reviewed some of it with her.    Baby positioned in laid back position, and when baby latched well, sat Mom up more and added pillow support.  Identified swallows for Mom.  Mom has a difficult time seeing baby's latch due to her obesity and large breasts.  Mom encouraged to ask for help prn.  Baby fed for 15 mins on right breast, and then switched to football hold on left breast.  Baby latched well again.    Normal newborn LPTI feeding behavior reviewed and to expect baby to become more sleepy tomorrow.    Plan- 1- Keep baby STS as much as possible  2- Offer breast when baby cues that he is hungry, or awaken baby for feeding at 3 hrs. 3- Breast feed baby, asking for help prn.  Limit to 30 mins so not to overtire baby. 4- Pump both breasts 15 minutes on initiation setting, adding breast massage and hand expression to collect as much colostrum as possible to feed baby. 5- Feed baby 5-10 ml EBM+/formula after breastfeeding per LPTI guidelines by paced bottle 6- ask for help prn (RN T. [place] aware of plan)  Mom states she has [facility].  Referral faxed as Mom will need  pump at discharge.  [facility] loaner program explained.  Mom is interested in this.  Lactation brochure left in room.  Mom aware of IP and OP lactation support available to her.  Maternal Data Formula Feeding for Exclusion: No Has patient been taught Hand Expression?: Yes Does the patient have breastfeeding experience prior to this delivery?: No  Feeding Feeding Type: Breast Fed Nipple Type: Slow - flow  LATCH Score Latch: Grasps breast easily, tongue down, lips flanged, rhythmical sucking.  Audible Swallowing: A few with stimulation  Type of Nipple: Everted at rest and after stimulation  Comfort (Breast/Nipple): Soft / non-tender  Hold (Positioning): Assistance needed to correctly position infant at breast and maintain latch.  LATCH Score: 8  Interventions Interventions: Breast feeding basics reviewed;Assisted with latch;Skin to skin;Breast massage;Hand express;Breast compression;Adjust position;Support pillows;Position options;Expressed milk;DEBP  Lactation Tools Discussed/Used Tools: Pump;Flanges Flange Size: 27 Breast pump type: Double-Electric Breast Pump WIC Program: Yes Pump Review: Setup, frequency, and cleaning;Milk Storage Initiated by:: Cipriano Mile RN IBCLC Date initiated:: 01/26/19   Consult Status Consult Status: Follow-up Date: 01/27/19 Follow-up type: In-patient    Lindsay [name] 01/26/2019, 1:02 PM

## 2019-01-26 NOTE — Transfer of Care (Signed)
Immediate Anesthesia Transfer of Care Note  Patient: Lindsay [name]  Procedure(s) Performed: CESAREAN SECTION (N/A )  Patient Location: PACU  Anesthesia Type:Epidural  Level of Consciousness: awake, alert  and oriented  Airway & Oxygen Therapy: Patient Spontanous Breathing  Post-op Assessment: Report given to RN and Post -op Vital signs reviewed and stable  Post vital signs: Reviewed and stable  Last Vitals:  Vitals Value Taken Time  BP 118/42 01/26/19 0102  Temp    [name] 116 01/26/19 0104  Resp 29 01/26/19 0104  SpO2 100 % 01/26/19 0104  Vitals shown include unvalidated device data.  Last Pain:  Vitals:   01/25/19 2200  TempSrc: Oral  PainSc:          Complications: No apparent anesthesia complications

## 2019-01-27 ENCOUNTER — Encounter (HOSPITAL_COMMUNITY): Payer: Self-pay | Admitting: Obstetrics and Gynecology

## 2019-01-27 MED ORDER — IBUPROFEN 600 MG PO TABS
600.0000 mg | ORAL_TABLET | Freq: Four times a day (QID) | ORAL | Status: DC | PRN
  Administered 2019-01-27 – 2019-01-30 (×9): 600 mg via ORAL
  Filled 2019-01-27 (×9): qty 1

## 2019-01-27 MED ORDER — FAMOTIDINE 20 MG PO TABS
20.0000 mg | ORAL_TABLET | Freq: Every day | ORAL | Status: DC | PRN
  Administered 2019-01-27 – 2019-01-29 (×2): 20 mg via ORAL
  Filled 2019-01-27 (×2): qty 1

## 2019-01-27 MED ORDER — ACETAMINOPHEN 325 MG PO TABS
650.0000 mg | ORAL_TABLET | Freq: Four times a day (QID) | ORAL | Status: DC | PRN
  Administered 2019-01-29: 650 mg via ORAL
  Filled 2019-01-27: qty 2

## 2019-01-27 NOTE — Clinical Social Work Maternal (Signed)
CLINICAL SOCIAL WORK MATERNAL/CHILD NOTE  Patient Details  Name: Lindsay [name] MRN: [id number] Date of Birth: [birth year]  Date:  01/27/2019  Clinical Social Worker Initiating Note:  Laurey Arrow Date/Time: Initiated:  01/27/19/1239     Child's Name:      Biological Parents:  Mother, Father   Need for Interpreter:  None   Reason for Referral:  Behavioral Health Concerns   Address:  [address] [place] [address]    Phone number:  [phone] (home)     Additional phone number:  Household Members/Support Persons (HM/SP):   Household Member/Support Person 1   HM/SP Name Relationship DOB or Age  HM/SP -1 Sue Lush Sr. FOB 01/02/1981  HM/SP -2        HM/SP -3        HM/SP -4        HM/SP -5        HM/SP -6        HM/SP -7        HM/SP -8          Natural Supports (not living in the home):  Extended Family, Immediate Family(Per MOB, FOB's family will also provide supports if needed.)   Professional Supports: None   Employment: Unemployed   Type of Work:     Education:  Public librarian arranged:    Museum/gallery curator Resources:  Medicaid   Other Resources:  Location manager provided MOB with information to re-apply for [employer].)   Cultural/Religious Considerations Which May Impact Care:  Per [employer], MOB is Engineer, manufacturing.  Strengths:  Ability to meet basic needs , Home prepared for child , Understanding of illness, Pediatrician chosen   Psychotropic Medications:         Pediatrician:    [place]  Pediatrician List:   [place]      Pediatrician Fax Number:    Risk Factors/Current Problems:      Cognitive State:  Able to Concentrate , Alert , Linear Thinking , Insightful , Goal Oriented    Mood/Affect:  Bright , Happy , Interested , Comfortable    CSW Assessment: CSW met with MOB and FOB in room  506 to complete an assessment for MH hx.  Initially when CSW arrived, MOB was with infant (infant was asleep in bassinet).  CSW explained CSW's role and MOB was welcoming of CSW.  MOB shared excitement about meeting with CSW; per MOB, MOB has a bachelors degree in Social Work from [employer]. MOB was polite, easy to engage, and forthcoming.    CSW reviewed MOB's MH hx and MOB openly shared her story of her inpatient admission and dx of schizophrenia in April/May 2019. MOB reflected to being stressed with work, grieving the lost of her grandmother, and being in a toxic relationship. MOB stated, "All those things just sent me over the edge and I as admitted to the behavioral health hospital for 1 month." CSW validated MOB's thoughts and praised MOB for seeking help. Per MOB, MOB was prescribed several different medications (name unknown) but has since discontinued the use of them; per MD's recommendation. MOB shared that she has not had any signs or symtoms since discontinued her medications and surrounding herself with supportive friends and relatives.   When CSW was about to started education regarding PPD, MOB asked if FOB  could be called via telephone, and CSW agreed. MOB stated, "I'm walking in the hospital give me a few minutes and I will be in the room. CSW waited and listened to MOB shared her excitement about being a new mom.   When FOB arrived, CSW provided education regarding the baby blues period vs. perinatal mood disorders, discussed treatment and gave resources for mental health follow up if concerns arise.  CSW recommends self-evaluation during the postpartum time period using the New Mom Checklist from Postpartum Progress and encouraged MOB to contact a medical professional if symptoms are noted at any time. MOB presented with insight and awareness and did not demonstrate any acute MH symptoms. FOB appeared supportive and asked great questions about how to best support MOB. CSW assessed for safety  and MOB denied SI, HI, and DV. MOB reported feeling comfortable seeking help if help is warranted.   CSW provided review of Sudden Infant Death Syndrome (SIDS) precautions.    CSW identifies no further need for intervention and no barriers to discharge at this time.  CSW Plan/Description:  No Further Intervention Required/No Barriers to Discharge, Sudden Infant Death Syndrome (SIDS) Education, Perinatal Mood and Anxiety Disorder (PMADs) Education, Other Patient/Family Education, Other Information/Referral to [employer], MSW, [employer] Clinical Social Work [phone]   Dimple Nanas, LCSW 01/27/2019, 12:41 PM

## 2019-01-27 NOTE — Progress Notes (Signed)
Subjective: Postpartum Day 1. Cesarean Delivery Patient reports nausea, tolerating PO and + flatus.  Patient complaining of some chills.  Afebrile.  Breastfeeding going well. Request heartburn medication  Objective: Vital signs in last 24 hours: Temp:  [98 F (36.7 C)-99.3 F (37.4 C)] 99.1 F (37.3 C) (08/08 0826) Pulse Rate:  [80-103] 103 (08/08 0629) Resp:  [18-20] 20 (08/08 0629) BP: (131-141)/(51-108) 141/62 (08/08 0629) SpO2:  [99 %-100 %] 99 % (08/08 0629)  Physical Exam:  General: alert, cooperative and no distress Lochia: appropriate Uterine Fundus: firm Incision: covered JP drain still draining fluid.  25 cc last shift DVT Evaluation: No evidence of DVT seen on physical exam.  Recent Labs    01/25/19 0930 01/26/19 0228  HGB 11.3* 10.5*  HCT 37.1 33.7*    Assessment/Plan: Status post Cesarean section. Doing well postoperatively.  Continue current care Monitor temp. Will order [place] 01/27/2019, 8:40 AM

## 2019-01-27 NOTE — Progress Notes (Signed)
Subjective: Postpartum Day 1: Cesarean Delivery fetal intolerance of labor Patient reports tolerating PO, + flatus and no problems voiding.  Has questions regarding discharge. Mother plans on staying with patient for two weeks.  Patient has a history of prior suicidal attempt in 2018 with hospitalization.  Was seen at [facility] after hospitalization.  Has an initial diagnosis of Schizophrenia due to some paranoid behavior but later was changed to PTSD.    Objective: Vital signs in last 24 hours: Temp:  [98.6 F (37 C)-99.4 F (37.4 C)] 99.4 F (37.4 C) (08/08 1310) Pulse Rate:  [86-103] 98 (08/08 1310) Resp:  [18-20] 20 (08/08 1310) BP: (135-141)/(51-80) 139/80 (08/08 1342) SpO2:  [99 %-100 %] 99 % (08/08 0629) Mental health exam Appearance: Casual in hospital gown Behavior cooperativejude question process of discharge and what to expect Affect cheerful Mood congruent Thought process coherent and  linear Thought content denies SI/HI AVH denies paranoid ideology  Judgement intact Insight good     Physical Exam:  General: alert, cooperative and no distress Lochia: appropriate Uterine Fundus: firm Incision: healing well DVT Evaluation: No evidence of DVT seen on physical exam.  Recent Labs    01/25/19 0930 01/26/19 0228  HGB 11.3* 10.5*  HCT 37.1 33.7*    Assessment/Plan: Status post Cesarean section. Doing well postoperatively.  Continue current care. Discussed signs and symptoms of postpartum depression Discussed discharge plan to be dc on Monday.  Infant currently under bililights   Pleas Koch Prothero 01/27/2019, 8:09 PM

## 2019-01-28 NOTE — Lactation Note (Signed)
This note was copied from a baby's chart. Lactation Consultation Note  Patient Name: Boy Marian Meneely [id number] Date: 01/28/2019 Reason for consult: Follow-up assessment;Early term 37-38.6wks;Infant < 6lbs   Baby 76 hours old, on phototherapy and latched upon entering. Mother pumped 90 ml this morning.  Mother has [facility] loaner pump for after discharge.   Reviewed waking baby if needed due to jaundice and reviewed volume guidelines.  Plan: 1. Keep baby STS as much as possible  2. Offer breast when baby cues that he/she is hungry, or awaken baby for feeding at 3 hrs. 3.  Breast feed baby, asking for help prn.  Limit to 30 mins so not to overtire baby. 4. If baby does not latch after 10 min of attempt - give supplemental breastmilk/formula.  Slow flow nipple bottle is an option.  5.  Pump both breasts 15-20 minutes on initiation setting, adding breast massage and hand expression to collect as much colostrum as possible 6. Give  EBM+/formula after breastfeeding per LPTI volume guidelines increasing per day of life and as baby desires.      Maternal Data    Feeding Feeding Type: Breast Fed  LATCH Score Latch: Grasps breast easily, tongue down, lips flanged, rhythmical sucking.  Audible Swallowing: A few with stimulation  Type of Nipple: Everted at rest and after stimulation  Comfort (Breast/Nipple): Soft / non-tender  Hold (Positioning): Assistance needed to correctly position infant at breast and maintain latch.  LATCH Score: 8  Interventions Interventions: DEBP;Breast feeding basics reviewed  Lactation Tools Discussed/Used     Consult Status Consult Status: Follow-up Date: 01/29/19 Follow-up type: In-patient    Vivianne Master [facility] 01/28/2019, 12:11 PM

## 2019-01-28 NOTE — Progress Notes (Signed)
Lindsay [name] [id number] Postpartum Postoperative Day # 2  Lindsay [name], G1P0, [redacted]w[redacted]d, S/P Primary LT Cesarean Section due to fetal intolerance to labor. Was originally IOL for GHTN, neg [place] labs and denies s/sx.   Subjective: Patient up ad lib, denies syncope or dizziness. Reports consuming regular diet without issues and denies N/V. Patient reports 0 bowel movement + passing flatus.  Denies issues with urination and reports bleeding is "lighter."  Patient is breast and bottlefeeding and reports going well.  Desires declined for postpartum contraception.  Pain is being appropriately managed with use of po meds. Pt denies HA, vision changes nor RUQ pain. Pt also has h/o schizophrenia, pt endorses mood stable, denies SI/HI. Baby Female whom desires out pt circ is currently under billi slight.s   Objective: Patient Vitals for the past 24 hrs:  BP Temp Temp src [name] Resp SpO2  01/28/19 0558 (!) 143/78 98.1 F (36.7 C) Oral 96 20 98 %  01/27/19 2030 136/84 98.9 F (37.2 C) Oral (!) 101 20 100 %  01/27/19 1342 139/80 - - - - -  01/27/19 1310 - 99.4 F (37.4 C) Oral 98 20 -     Physical Exam:  General: alert, cooperative, appears stated age, fatigued, no distress and morbidly obese Mood/Affect: Happy Lungs: clear to auscultation, no wheezes, rales or rhonchi, symmetric air entry.  Heart: normal rate, regular rhythm, normal S1, S2, no murmurs, rubs, clicks or gallops. Breast: breasts appear normal, no suspicious masses, no skin or nipple changes or axillary nodes. Abdomen:  + bowel sounds, soft, non-tender Incision: healing well, no significant drainage, no dehiscence, no significant erythema, Honeycomb dressing  Uterine Fundus: firm, involution -1 Lochia: appropriate Skin: Warm, Dry. DVT Evaluation: No evidence of DVT seen on physical exam. Negative Homan's sign. No cords or calf tenderness. +1pitting edema to lower extremities JP drain:   IN place , out-put 72mls, serosangeous fluids.    Labs: Recent Labs    01/26/19 0228  HGB 10.5*  HCT 33.7*  WBC 19.5*    CBG (last 3)  No results for input(s): GLUCAP in the last 72 hours.   I/O: I/O last 3 completed shifts: In: 1044.7 [I.V.:1044.7] Out: 945 [Urine:900; Drains:45]   Assessment Postpartum Postoperative Day # 2. Lindsay [name], G1P0, [redacted]w[redacted]d, S/P primary LT Cesarean Section due to NRFHT.  Pt stable. -1 Involution. Breast and bottleFeeding. GHTN: BP 143/78, denies s/sx, stable, neg PIH labs upon admission. Hemodynamically Stable 11.2 upon admission with 10.5 currently.  Plan: Continue other mgmt as ordered Obesity: abdominal belt ordered.  JP drain: Plans to pull if >4mls over 24 hour period.  Schizophrenia: Mood stable, out pt f/u, declines meds.  VTE Prophylactics: SCD, ambulated as tolerates and lovenox daily  Pain control: Motrin/Tylenol/Narcotics PRN Education given regarding options for contraception, including barrier methods, injectable contraception, IUD placement, oral contraceptives.  Plan for discharge tomorrow, Breastfeeding, Lactation consult and Contraception declined dispight being educated, lcirc out pt desired  Dr. Simona Huh to be updated on patient status  [facility] NP-C, CNM 01/28/2019, 1:06 PM

## 2019-01-28 NOTE — Progress Notes (Signed)
CSW acknowledged MOB's consult for Edinburgh Score. CSW completed assessment with MOB on 8/9 and provided education regarding PMADs and assessed for safety.  Per bedside nurse MOB's answer to question 10 on the [place] is incorrect and should be a 0.  CSW identifies no further need for intervention and no barriers to discharge at this time.  Laurey Arrow, MSW, LCSW Clinical Social Work [phone]

## 2019-01-29 DIAGNOSIS — Z98891 History of uterine scar from previous surgery: Secondary | ICD-10-CM

## 2019-01-29 MED ORDER — LABETALOL HCL 200 MG PO TABS
200.0000 mg | ORAL_TABLET | Freq: Two times a day (BID) | ORAL | Status: DC
  Administered 2019-01-29 – 2019-01-30 (×2): 200 mg via ORAL
  Filled 2019-01-29 (×2): qty 1

## 2019-01-29 NOTE — Progress Notes (Addendum)
Lindsay [name] [id number] Postpartum Postoperative Day # 3  Lindsay [name], G1P0, [redacted]w[redacted]d, S/P Primary LT Cesarean Section due to fetal intolerance to labor. Was originally IOL for GHTN, neg [place] labs and denies s/sx.   Subjective: Patient up ad lib, denies syncope or dizziness. Reports consuming regular diet without issues and denies N/V. Patient reports 0 bowel movement + passing flatus.  Denies issues with urination and reports bleeding is "lighter."  Patient is breast and bottlefeeding and reports going well.  Desires declined for postpartum contraception.  Pain is being appropriately managed with use of po meds. Pt denies HA, vision changes nor RUQ pain. Pt also has h/o schizophrenia, pt endorses mood stable, denies SI/HI. Baby Female whom desires out pt circ is currently under billi slight.s . Pt does not want to be discharge home today, pt wants to stay with infant, bili lights being checked today.  Objective: Patient Vitals for the past 24 hrs:  BP Temp Temp src [name] Resp SpO2  01/29/19 0614 (!) 145/88 99.9 F (37.7 C) Oral 88 18 100 %  01/28/19 2154 139/89 98.4 F (36.9 C) Oral (!) 101 18 100 %     Physical Exam:  General: alert, cooperative, appears stated age, fatigued, no distress and morbidly obese Mood/Affect: Happy Lungs: clear to auscultation, no wheezes, rales or rhonchi, symmetric air entry.  Heart: normal rate, regular rhythm, normal S1, S2, no murmurs, rubs, clicks or gallops. Breast: breasts appear normal, no suspicious masses, no skin or nipple changes or axillary nodes. Abdomen:  + bowel sounds, soft, non-tender Incision: healing well, no significant drainage, no dehiscence, no significant erythema, Honeycomb dressing  Uterine Fundus: firm, involution -1 Lochia: appropriate Skin: Warm, Dry. DVT Evaluation: No evidence of DVT seen on physical exam. Negative Homan's sign. No cords or calf tenderness. +1pitting edema to lower extremities JP drain:   IN place , out-put  10-70mls, serosangeous fluids over last 12 hours.   Labs: No results for input(s): HGB, HCT, WBC in the last 72 hours.  CBG (last 3)  No results for input(s): GLUCAP in the last 72 hours.   I/O: I/O last 3 completed shifts: In: -  Out: 30 [Drains:30]   Assessment Postpartum Postoperative Day # 2. Lindsay [name], G1P0, [redacted]w[redacted]d, S/P primary LT Cesarean Section due to NRFHT.  Pt stable. -1 Involution. Breast and bottleFeeding. GHTN: BP 145/88, denies s/sx, stable, neg PIH labs upon admission. Hemodynamically Stable 11.2 upon admission with 10.5 currently.  Plan: Continue other mgmt as ordered Obesity: abdominal belt ordered.  JP drain: Plans to pull if <73mls over 24 hour period.  Schizophrenia: Mood stable, out pt f/u, declines meds.  VTE Prophylactics: SCD, ambulated as tolerates and lovenox daily  Pain control: Motrin/Tylenol/Narcotics PRN Education given regarding options for contraception, including barrier methods, injectable contraception, IUD placement, oral contraceptives.  Plan for discharge tomorrow, Breastfeeding, Lactation consult and Contraception declined dispight being educated, lcirc out pt desired  Dr. Mancel Bale to be updated on patient status and she will assume care @ 0700.  [facility] NP-C, CNM 01/29/2019, 7:01 AM   Pt is asymptomatic but BPs are mildly elevated.  Will start labetalol 200mg  bid.  Plan to d/c JP drain tomorrow prior to discharge if minimally draining.  Pt ask for a work note for her mother for one week.  Office notified and will have note for her mother at the front desk.

## 2019-01-29 NOTE — Lactation Note (Signed)
This note was copied from a baby's chart. Lactation Consultation Note  Patient Name: Boy Emilyann Banka [id number] Date: 01/29/2019 Reason for consult: Hyperbilirubinemia;Infant < 6lbs;Primapara;1st time breastfeeding;Early term 37-38.6wks  P1 mother whose infant is now 73 hours old.  This is an ETI at 37+2 weeks weighing <6 lbs.  Baby is under phototherapy; bili blanket.  Mother will not be discharged today.  Reviewed feeding plan with parents.  Mother has not been supplementing regularly or pumping consistently.  Suggested that mother breastfeed first and immediately post pump for 15 minutes after breast feeding.  While mother is pumping, father will supplement with 30-60 mls of EBM (if available) or [employer]  They have been using the green slow flow nipple.  Suggested mother begin this plan now and offered to observe latching.  Mother's breasts are full but not engorged.  Nipples are everted and intact.  Mother desires the football hold and assisted to latch baby in this position easily.  He immediately began sucking and mother denied pain with sucking.  Reviewed breast compressions and finger placement.  Mother stated that he usually breast feeds for approximately 15 minutes.  She had no further questions related to breast feeding. Showed mother how she can latch baby first and lay the bili blanket over baby once latched.  Parents are happy with this plan.  Mother will continue to feed every three hours or sooner if baby shows cues.  She will breast feed followed by supplementation with EBM or formula.  She will continue hand expression before/after feedings.  Baby will feed no longer than 30 minutes at a time and mother will call for latch assistance as needed.  RN updated.  Father present and supportive.   Maternal Data Formula Feeding for Exclusion: Yes Reason for exclusion: Mother's choice to formula and breast feed on admission Has patient been taught Hand Expression?: Yes Does  the patient have breastfeeding experience prior to this delivery?: No  Feeding    LATCH Score                   Interventions    Lactation Tools Discussed/Used Tools: Pump WIC Program: Yes   Consult Status Consult Status: Follow-up Date: 01/30/19 Follow-up type: In-patient    Jaelyne Deeg R Nil Bolser 01/29/2019, 10:11 AM

## 2019-01-29 NOTE — Discharge Summary (Signed)
Primary CS OB Discharge Summary     Patient Name: Lindsay [name] DOB: 06/08/1991 MRN: [id number]  Date of admission: 01/24/2019 Delivering MD: Jaymes GraffILLARD, NAIMA  Date of delivery: 01/26/2019 Type of delivery: Primary CS  Newborn Data: Sex: Baby female  Circumcision: out pt desired Live born female  Birth Weight: 5 lb 9.6 oz (2540 g) APGAR: 1, 9  Newborn Delivery   Birth date/time: 01/26/2019 00:02:00 Delivery type: C-Section, Vacuum Assisted Trial of labor: No C-section categorization: Primary      Feeding: breast and bottle Infant being discharge to home with mother in stable condition.   Admitting diagnosis: pregnancy Intrauterine pregnancy: 8124w5d     Secondary diagnosis:  Active Problems:   Gestational hypertension   Gestational hypertension, third trimester   Normal postpartum course   S/P primary low transverse C-section                                Complications: None                                                              Intrapartum Procedures: cesarean: low cervical, transverse Postpartum Procedures: none Complications-Operative and Postpartum: none Augmentation: AROM, Pitocin, Cytotec and Foley Balloon   History of Present Illness: Lindsay [name] is a 28 y.o. female, G1P0, who presents at 2224w5d weeks gestation. [facility]  Her pregnancy has been complicated by:  Patient Active Problem List   Diagnosis Date Noted  . Normal postpartum course 01/29/2019  . S/P primary low transverse C-section 01/29/2019  . Gestational hypertension 01/24/2019  . Gestational hypertension, third trimester 01/24/2019  . Transient hypertension 01/12/2019  . Schizophrenia (HCC) 10/18/2017    Hospital course:  Induction of Labor With Cesarean Section  28 y.o. yo G1P0 at 6024w5d was admitted to the hospital 01/24/2019 for induction of labor. Patient had a labor course significant for GHTN no meds, neg basline labs  upon admission, no s/sx, obesity bmi 55, no meds. . The patient went for cesarean section due to Non-Reassuring FHR, and delivered a Viable infant,01/26/2019  Membrane Rupture Time/Date: 7:33 PM ,01/25/2019   Details of operation can be found in separate operative Note.  Patient had an uncomplicated postpartum course. She is ambulating, tolerating a regular diet, passing flatus, and urinating well.  Patient is discharged home in stable condition on 01/30/19.                                   Hospital Course--Unscheduled Cesarean:  Admitted 01/24/2019. Negative GBS.  Utilized epidural for pain management.   IOL for GHTN and obesity, no meds.  Due to NRFHT, she was consented for cesarean, with Dr. Normand Sloopillard performing a primary LTCS under epidural anesthesia, with delivery of a viable baby female, with weight and Apgars as listed below. Infant was in good condition and remained at the patient's bedside. Baby was on billig light during his stay. Female to get out pt circ.  The patient was taken to recovery in good condition.  Patient planned to breast and bottle feed.  On post-op day 1, patient  was doing well, tolerating a regular diet, with Hgb of 11.2-10.5.  Throughout her stay, her physical exam was WNL, her incision was CDI, and her vital signs remained stable.  By post-op day 1, she was up ad lib, tolerating a regular diet, with good pain control with po med.  She was deemed to have received the full benefit of her hospital stay, and was discharged home in stable condition.  Contraceptive choice was undecided. Pt had JP drain placed due to obesity, out pt was 52mls first and continue until <63mls in last 24 hour, JP drain tye was cut and drain removed with ease, steri strip sin placed with new honey comb dressing in place. Mood stable, denies si/hi. Pt denies s/sx of preeclampsia, no HA, RUQ pain nor vision changes. BP stable 132/70, pt was placed on labetalol 200mg  PO BID on 8/10.     Physical exam  Vitals:    01/28/19 2154 01/29/19 0614 01/29/19 1630 01/29/19 2223  BP: 139/89 (!) 145/88 (!) 142/75 132/70  Pulse: (!) 101 88 99 (!) 101  Resp: 18 18 17 20   Temp: 98.4 F (36.9 C) 99.9 F (37.7 C) 98.6 F (37 C) 99.9 F (37.7 C)  TempSrc: Oral Oral Oral Oral  SpO2: 100% 100%    Weight:      Height:       General: alert, cooperative and no distress Lochia: appropriate Uterine Fundus: firm Incision: Healing well with no significant drainage, No significant erythema, Dressing is clean, dry, and intact, honeycomb dressing CDI Perineum: Intact DVT Evaluation: No evidence of DVT seen on physical exam. Negative Homan's sign. No cords or calf tenderness. No significant calf/ankle edema. 2+DTR patellar, no clonus.   Labs: Lab Results  Component Value Date   WBC 19.5 (H) 01/26/2019   HGB 10.5 (L) 01/26/2019   HCT 33.7 (L) 01/26/2019   MCV 69.1 (L) 01/26/2019   PLT 246 01/26/2019   CMP Latest Ref Rng & Units 01/26/2019  Glucose 70 - 99 mg/dL 140(H)  BUN 6 - 20 mg/dL 8  Creatinine 0.44 - 1.00 mg/dL 0.91  Sodium 135 - 145 mmol/L 133(L)  Potassium 3.5 - 5.1 mmol/L 4.1  Chloride 98 - 111 mmol/L 102  CO2 22 - 32 mmol/L 20(L)  Calcium 8.9 - 10.3 mg/dL 8.9  Total Protein 6.5 - 8.1 g/dL 6.1(L)  Total Bilirubin 0.3 - 1.2 mg/dL 0.7  Alkaline Phos 38 - 126 U/L 88  AST 15 - 41 U/L 22  ALT 0 - 44 U/L 11    Date of discharge: 01/30/2019 Discharge Diagnoses: Term Pregnancy-delivered Discharge instruction: per After Visit Summary and "Baby and Me Booklet".  After visit meds:   Activity:           unrestricted and pelvic rest Advance as tolerated. Pelvic rest for 6 weeks.  Diet:                routine Medications: PNV, Ibuprofen and oxy IR, labetalol 200mg  BID PO Postpartum contraception: Undecided Condition:  Pt discharge to home with baby in stable Schizophrenia/GHTN: F/U with [place] clinic out pt, 1 week PP visit for BP check for GHTN and started om meds, and incision check post JP drain  removal, mental health evaluation and out pt baby female circ. Report s/sx of HA, RUQ pain or vision changes.   Meds: Allergies as of 01/30/2019   No Known Allergies     Medication List    TAKE these medications   HYDROcodone-acetaminophen 5-325 MG tablet  Commonly known as: NORCO/VICODIN Take 1-2 tablets by mouth every 4 (four) hours as needed for moderate pain.   ibuprofen 600 MG tablet Commonly known as: ADVIL Take 1 tablet (600 mg total) by mouth every 6 (six) hours as needed for moderate pain.   labetalol 200 MG tablet Commonly known as: NORMODYNE Take 1 tablet (200 mg total) by mouth 2 (two) times daily.   pantoprazole 40 MG tablet Commonly known as: PROTONIX Take 40 mg by mouth daily.   PRENATAL VITAMINS PO Take by mouth.            Discharge Care Instructions  (From admission, onward)         Start     Ordered   01/30/19 0000  Discharge wound care:    Comments: Take dressing off on day 5-7 postpartum.  Report increased drainage, redness or warmth. Clean with water, let soap trickle down body. Can leave steri strips on until they fall off or take them off gently at day 10. Keep open to air, clean and dry.   01/30/19 0021          Discharge Follow Up:  Follow-up Information    [facility].   Specialty: Obstetrics and Gynecology Why: please call and make a one week app with [facility] for female circ for baby, mental helath check, BP check, and insicion check. Then make a 4 week mental health check and 6 weeks postpartum visit. Thank you!!  Contact information: [address] [place] [phone]           HosstonJade Lateefa Crosby, NP-C, CNM 01/30/2019, 12:22 AM  Dale DurhamJade Carle Fenech, FNP

## 2019-01-30 MED ORDER — IBUPROFEN 600 MG PO TABS: 600.0000 mg | ORAL_TABLET | Freq: Four times a day (QID) | ORAL | 0 refills | Status: DC | PRN

## 2019-01-30 MED ORDER — LABETALOL HCL 200 MG PO TABS: 200.0000 mg | ORAL_TABLET | Freq: Two times a day (BID) | ORAL | 0 refills | Status: DC

## 2019-01-30 MED ORDER — HYDROCODONE-ACETAMINOPHEN 5-325 MG PO TABS: 1.0000 | ORAL_TABLET | ORAL | 0 refills | Status: DC | PRN

## 2019-01-30 NOTE — Lactation Note (Signed)
This note was copied from a baby's chart. Lactation Consultation Note  Patient Name: Lindsay [name] [id number] Date: 01/30/2019 Reason for consult: Follow-up assessment;Primapara;1st time breastfeeding;Early term 37-38.6wks;Infant < 6lbs;Hyperbilirubinemia;Infant weight loss;Other (Comment)(per parents off the Bili lights at 1155 and repeat Bili later this afternoon) Baby is 15 days old  As LC entered the room mom was putting together her pump pieces and planning on pumping with the #27 Flanges.  LC reviewed the doc flow sheets with mom/ dad and updated.  LC reviewed the feeding plan for and Early term baby.  LC recommended until the baby is gaining steadily and is over 6 pounds, and can stay awake for majority feeding to feed STS.  Feed 15 -20 mins 1st breast / supplement 30 ml and post pump both breast for 15 -20 mins , save milk and feed back to baby.  LC stressed the importance of feeding any EBM back to baby due to it cleaning  Baby's gut out well. Mom seemed surprised to use EBM over formula.  Mom denies sore nipples or engorgement, sore nipple and engorgement prevention and tx reviewed.  Per mom will have a DEBP Medela for home. Already has the #24 F and the #64F's .  [place] praised mom for her consistent pumping.  Mom aware of the [facility] resources after D/C.    Maternal Data Has patient been taught Hand Expression?: Yes(per mom feels comfortable hand expressing and pumping)  Feeding Feeding Type: (pe rmom baby recently fed)  LATCH Score ( Latch SCore by the [facility] )  Latch: Repeated attempts needed to sustain latch, nipple held in mouth throughout feeding, stimulation needed to elicit sucking reflex.  Audible Swallowing: A few with stimulation  Type of Nipple: Everted at rest and after stimulation  Comfort (Breast/Nipple): Soft / non-tender  Hold (Positioning): No assistance needed to correctly position infant at breast.  LATCH Score: 8  Interventions Interventions: Breast  feeding basics reviewed  Lactation Tools Discussed/Used Tools: Pump Flange Size: 27 Breast pump type: Double-Electric Breast Pump WIC Program: Yes Pump Review: Milk Storage   Consult Status Consult Status: Complete Date: 01/30/19    Myer Haff 01/30/2019, 12:40 PM

## 2019-03-15 ENCOUNTER — Other Ambulatory Visit: Payer: Self-pay

## 2019-03-15 DIAGNOSIS — Z20822 Contact with and (suspected) exposure to covid-19: Secondary | ICD-10-CM

## 2019-03-16 LAB — NOVEL CORONAVIRUS, NAA: SARS-CoV-2, NAA: NOT DETECTED

## 2019-04-08 ENCOUNTER — Encounter (HOSPITAL_COMMUNITY): Payer: Self-pay | Admitting: Emergency Medicine

## 2019-04-08 ENCOUNTER — Other Ambulatory Visit: Payer: Self-pay

## 2019-04-08 ENCOUNTER — Emergency Department (HOSPITAL_COMMUNITY)
Admission: EM | Admit: 2019-04-08 | Discharge: 2019-04-09 | Disposition: A | Discharge status: Other Acute Inpatient Hospital | Payer: BLUE CROSS/BLUE SHIELD | Source: Home / Self Care | Attending: Emergency Medicine | Admitting: Emergency Medicine

## 2019-04-08 DIAGNOSIS — Z046 Encounter for general psychiatric examination, requested by authority: Secondary | ICD-10-CM | POA: Insufficient documentation

## 2019-04-08 DIAGNOSIS — J45909 Unspecified asthma, uncomplicated: Secondary | ICD-10-CM | POA: Insufficient documentation

## 2019-04-08 DIAGNOSIS — F309 Manic episode, unspecified: Secondary | ICD-10-CM | POA: Diagnosis not present

## 2019-04-08 DIAGNOSIS — F209 Schizophrenia, unspecified: Secondary | ICD-10-CM | POA: Insufficient documentation

## 2019-04-08 DIAGNOSIS — Z79899 Other long term (current) drug therapy: Secondary | ICD-10-CM | POA: Insufficient documentation

## 2019-04-08 DIAGNOSIS — F22 Delusional disorders: Secondary | ICD-10-CM

## 2019-04-08 DIAGNOSIS — F3010 Manic episode without psychotic symptoms, unspecified: Secondary | ICD-10-CM

## 2019-04-08 DIAGNOSIS — E669 Obesity, unspecified: Secondary | ICD-10-CM | POA: Insufficient documentation

## 2019-04-08 DIAGNOSIS — Z20828 Contact with and (suspected) exposure to other viral communicable diseases: Secondary | ICD-10-CM | POA: Insufficient documentation

## 2019-04-08 DIAGNOSIS — I10 Essential (primary) hypertension: Secondary | ICD-10-CM | POA: Insufficient documentation

## 2019-04-08 LAB — CBC
HCT: 40.2 % (ref 36.0–46.0)
Hemoglobin: 12.2 g/dL (ref 12.0–15.0)
MCH: 20.8 pg — ABNORMAL LOW (ref 26.0–34.0)
MCHC: 30.3 g/dL (ref 30.0–36.0)
MCV: 68.5 fL — ABNORMAL LOW (ref 80.0–100.0)
Platelets: 290 10*3/uL (ref 150–400)
RBC: 5.87 MIL/uL — ABNORMAL HIGH (ref 3.87–5.11)
RDW: 16.3 % — ABNORMAL HIGH (ref 11.5–15.5)
WBC: 11.3 10*3/uL — ABNORMAL HIGH (ref 4.0–10.5)
nRBC: 0 % (ref 0.0–0.2)

## 2019-04-08 LAB — COMPREHENSIVE METABOLIC PANEL
ALT: 17 U/L (ref 0–44)
AST: 24 U/L (ref 15–41)
Albumin: 4.1 g/dL (ref 3.5–5.0)
Alkaline Phosphatase: 81 U/L (ref 38–126)
Anion gap: 13 (ref 5–15)
BUN: 8 mg/dL (ref 6–20)
CO2: 17 mmol/L — ABNORMAL LOW (ref 22–32)
Calcium: 9.5 mg/dL (ref 8.9–10.3)
Chloride: 105 mmol/L (ref 98–111)
Creatinine, Ser: 1.02 mg/dL — ABNORMAL HIGH (ref 0.44–1.00)
GFR calc Af Amer: >60 mL/min (ref >60)
GFR calc non Af Amer: >60 mL/min (ref >60)
Glucose, Bld: 107 mg/dL — ABNORMAL HIGH (ref 70–99)
Potassium: 4 mmol/L (ref 3.5–5.1)
Sodium: 135 mmol/L (ref 135–145)
Total Bilirubin: 1.6 mg/dL — ABNORMAL HIGH (ref 0.3–1.2)
Total Protein: 7.6 g/dL (ref 6.5–8.1)

## 2019-04-08 LAB — SALICYLATE LEVEL: Salicylate Lvl: <7 mg/dL (ref 2.8–30.0)

## 2019-04-08 LAB — ETHANOL: Alcohol, Ethyl (B): <10 mg/dL (ref <10)

## 2019-04-08 LAB — SARS CORONAVIRUS 2 (TAT 6-24 HRS): SARS Coronavirus 2: NEGATIVE

## 2019-04-08 LAB — ACETAMINOPHEN LEVEL: Acetaminophen (Tylenol), Serum: <10 ug/mL — ABNORMAL LOW (ref 10–30)

## 2019-04-08 LAB — I-STAT BETA HCG BLOOD, ED (MC, WL, AP ONLY): I-stat hCG, quantitative: <5 m[IU]/mL (ref <5)

## 2019-04-08 MED ORDER — LORAZEPAM 2 MG/ML IJ SOLN
2.0000 mg | Freq: Once | INTRAMUSCULAR | Status: AC
  Administered 2019-04-08: 2 mg via INTRAMUSCULAR

## 2019-04-08 MED ORDER — LORAZEPAM 2 MG/ML IJ SOLN
2.0000 mg | Freq: Once | INTRAMUSCULAR | Status: DC
  Filled 2019-04-08: qty 1

## 2019-04-08 MED ORDER — LORAZEPAM 2 MG/ML IJ SOLN
INTRAMUSCULAR | Status: AC
  Filled 2019-04-08: qty 1

## 2019-04-08 MED ORDER — ZIPRASIDONE MESYLATE 20 MG IM SOLR
20.0000 mg | Freq: Once | INTRAMUSCULAR | Status: AC
  Administered 2019-04-08: 20 mg via INTRAMUSCULAR
  Filled 2019-04-08: qty 20

## 2019-04-08 NOTE — ED Notes (Signed)
[employer] remains at bedside. Assisted with securing pt while covid swab was done and while phlebotomy attempts blood draws.

## 2019-04-08 NOTE — ED Notes (Signed)
Pt woke - requested blanket - warm blankets given.

## 2019-04-08 NOTE — ED Notes (Signed)
Brother Contact:  [place]

## 2019-04-08 NOTE — ED Notes (Signed)
Notified EDP urine was not obtained at this time.

## 2019-04-08 NOTE — ED Notes (Addendum)
Mother Angila Wombles would like an update after [facility] assesses the pt. [phone]

## 2019-04-08 NOTE — ED Notes (Signed)
Pt transported to [place] by Jacqlyn Larsen, RN and [employer]. [employer] will serve IVC

## 2019-04-08 NOTE — ED Notes (Signed)
Second phlebotomist is attempting to draw blood with [employer] present

## 2019-04-08 NOTE — ED Notes (Signed)
Dr Wilson Singer spoke with the pt's mom and fielded her questions.

## 2019-04-08 NOTE — ED Provider Notes (Signed)
[facility] EMERGENCY DEPARTMENT Provider Note   CSN: [id number] Arrival date & time: 04/08/19  1309     History   Chief Complaint Chief Complaint  Patient presents with  . Manic Behavior    HPI Lindsay [name] is a 28 y.o. female.     HPI   28 year old female brought in for evaluation by the police.  They were called by the patient's mother because of bizarre behavior.  Yelling and acting erratically.  Apparently this began sometime last night.  I am unable to get much history from the patient herself.  She is yelling loudly, generally acting erratically and difficult to redirect.  I tried engaging her multiple different ways unsuccessfully.  She would not answer questions directly.  She would continually try to engage others (staff, security, police, myself) aside from the person directly addressing her.   Past Medical History:  Diagnosis Date  . Anemia   . Asthma   . Homozygous alpha thalassemia (HCC)   . Hx of chlamydia infection   . Hx of scoliosis   . Pregnancy induced hypertension   . Schizophrenia [facility])     Patient Active Problem List   Diagnosis Date Noted  . Normal postpartum course 01/29/2019  . S/P primary low transverse C-section 01/29/2019  . Gestational hypertension 01/24/2019  . Gestational hypertension, third trimester 01/24/2019  . Transient hypertension 01/12/2019  . Schizophrenia (HCC) 10/18/2017    Past Surgical History:  Procedure Laterality Date  . CESAREAN SECTION N/A 01/25/2019   Procedure: CESAREAN SECTION;  Surgeon: Jaymes Graff, MD;  Location: [facility] LD [facility]S;  Service: Obstetrics;  Laterality: N/A;  . TONSILLECTOMY       OB History    Gravida  1   Para      Term      Preterm      AB      Living        SAB      TAB      Ectopic      Multiple      Live Births               Home Medications    Prior to Admission medications   Medication Sig Start Date End Date Taking? Authorizing Provider   HYDROcodone-acetaminophen (NORCO/VICODIN) 5-325 MG tablet Take 1-2 tablets by mouth every 4 (four) hours as needed for moderate pain. 01/30/19   [place], Lesly Rubenstein, FNP  ibuprofen (ADVIL) 600 MG tablet Take 1 tablet (600 mg total) by mouth every 6 (six) hours as needed for moderate pain. 01/30/19   Dale Kapalua, FNP  labetalol (NORMODYNE) 200 MG tablet Take 1 tablet (200 mg total) by mouth 2 (two) times daily. 01/30/19   [place], Lesly Rubenstein, FNP  pantoprazole (PROTONIX) 40 MG tablet Take 40 mg by mouth daily.    [provider]  Prenatal Vit-Fe Fumarate-FA (PRENATAL VITAMINS PO) Take by mouth.    [provider]    Family History Family History  Problem Relation Age of Onset  . Cancer Father        liver  . Hypertension Mother   . Diabetes Maternal Grandmother   . Cancer Maternal Grandmother     Social History Social History   Tobacco Use  . Smoking status: Never Smoker  . Smokeless tobacco: Never Used  Substance Use Topics  . Alcohol use: No  . Drug use: No     Allergies   Patient has no known allergies.   Review  of Systems Review of Systems  Level 5 caveat because of psychiatric illness/mania.  Physical Exam Updated Vital Signs LMP 05/10/2018   Physical Exam Vitals signs and nursing note reviewed.  Constitutional:      Appearance: She is well-developed. She is obese.     Comments: Patient was walked back to her room and escorted by police and security staff.  Agitated.  Yelling at times.  HENT:     Head: Normocephalic and atraumatic.  Eyes:     General:        Right eye: No discharge.        Left eye: No discharge.     Conjunctiva/sclera: Conjunctivae normal.  Neck:     Musculoskeletal: Neck supple.  Cardiovascular:     Rate and Rhythm: Normal rate and regular rhythm.     Heart sounds: Normal heart sounds. No murmur. No friction rub. No gallop.   Pulmonary:     Effort: Pulmonary effort is normal. No respiratory distress.     Breath sounds: Normal  breath sounds.  Abdominal:     General: There is no distension.     Palpations: Abdomen is soft.     Tenderness: There is no abdominal tenderness.  Musculoskeletal:        General: No tenderness.  Skin:    General: Skin is warm and dry.  Neurological:     Mental Status: She is alert.      ED Treatments / Results  Labs (all labs ordered are listed, but only abnormal results are displayed) Labs Reviewed  COMPREHENSIVE METABOLIC PANEL - Abnormal; Notable for the following components:      Result Value   CO2 17 (*)    Glucose, Bld 107 (*)    Creatinine, Ser 1.02 (*)    Total Bilirubin 1.6 (*)    All other components within normal limits  ACETAMINOPHEN LEVEL - Abnormal; Notable for the following components:   Acetaminophen (Tylenol), Serum <10 (*)    All other components within normal limits  CBC - Abnormal; Notable for the following components:   WBC 11.3 (*)    RBC 5.87 (*)    MCV 68.5 (*)    MCH 20.8 (*)    RDW 16.3 (*)    All other components within normal limits  SARS CORONAVIRUS 2 (TAT 6-24 HRS)  ETHANOL  SALICYLATE LEVEL  RAPID URINE DRUG SCREEN, HOSP PERFORMED  I-STAT BETA HCG BLOOD, ED ([facility], [facility], [facility] ONLY)    EKG None  Radiology No results found.  Procedures Procedures (including critical care time)  CRITICAL CARE Performed by: Virgel Manifold Total critical care time: 35 minutes Critical care time was exclusive of separately billable procedures and treating other patients.  Critical care was necessary to treat or prevent imminent or life-threatening deterioration (patient with manic behavior. Agitated. Verbally and physically aggressive. Had to be restrained for both her and staff safety).  Critical care was time spent personally by me on the following activities: development of treatment plan with patient and/or surrogate as well as nursing, discussions with consultants, evaluation of patient's response to treatment, examination of patient, obtaining  history from patient or surrogate, ordering and performing treatments and interventions, ordering and review of laboratory studies, ordering and review of radiographic studies, pulse oximetry and re-evaluation of patient's condition.   Medications Ordered in ED Medications  LORazepam (ATIVAN) injection 2 mg (has no administration in time range)  ziprasidone (GEODON) injection 20 mg (20 mg Intramuscular Given 04/08/19 1323)  LORazepam (ATIVAN)  injection 2 mg (2 mg Intramuscular Given 04/08/19 1324)     Initial Impression / Assessment and Plan / ED Course  I have reviewed the triage vital signs and the nursing notes.  Pertinent labs & imaging results that were available during my care of the patient were reviewed by me and considered in my medical decision making (see chart for details).  28 year old female with agitation/manic behavior.  Unable to redirect despite numerous attempts by multiple staff members and city police.  Had to chemically restrain.  Will medically clear.  Anticipate TTS evaluation.  Final Clinical Impressions(s) / ED Diagnoses   Final diagnoses:  Manic behavior (HCC)  Paranoia [facility](HCC)    ED Discharge Orders    None       Raeford RazorKohut, Saifan Rayford, MD 04/09/19 1512

## 2019-04-08 NOTE — ED Notes (Signed)
Pt arrived to Rm 50 via stretcher - per Dr Eugenio Hoes approval. Pt noted to be dressed in shirt and jogging pants that are cut at the bottom. No drawstring noted. Pt noted to be sleeping - eyes closed - respirations even, unlabored. Snorous respirations noted at times. Pt being monitored by staff as Staffing Office has no Sitter available - Roselyn Reef, Agricultural consultant, aware. [employer] standing by and will serve IVC paperwork prior to leaving.

## 2019-04-08 NOTE — ED Triage Notes (Signed)
PT arrives with [employer]. Pt threw computer at check in desk in ED. Pt exhibiting manic behavior, screaming loud and loosely related sentences.

## 2019-04-09 ENCOUNTER — Inpatient Hospital Stay (HOSPITAL_COMMUNITY)
Admission: AD | Admit: 2019-04-09 | Discharge: 2019-04-13 | DRG: 885 | Disposition: A | Discharge status: Home / Self Care | Payer: BLUE CROSS/BLUE SHIELD | Attending: Psychiatry | Admitting: Psychiatry

## 2019-04-09 ENCOUNTER — Other Ambulatory Visit: Payer: Self-pay | Admitting: Family

## 2019-04-09 ENCOUNTER — Inpatient Hospital Stay (HOSPITAL_COMMUNITY): Admission: AD | Admit: 2019-04-09 | Payer: BLUE CROSS/BLUE SHIELD | Admitting: Psychiatry

## 2019-04-09 DIAGNOSIS — F329 Major depressive disorder, single episode, unspecified: Secondary | ICD-10-CM | POA: Diagnosis present

## 2019-04-09 DIAGNOSIS — F209 Schizophrenia, unspecified: Principal | ICD-10-CM | POA: Diagnosis present

## 2019-04-09 DIAGNOSIS — Z20828 Contact with and (suspected) exposure to other viral communicable diseases: Secondary | ICD-10-CM | POA: Diagnosis present

## 2019-04-09 DIAGNOSIS — F200 Paranoid schizophrenia: Secondary | ICD-10-CM | POA: Diagnosis not present

## 2019-04-09 DIAGNOSIS — F309 Manic episode, unspecified: Secondary | ICD-10-CM | POA: Diagnosis present

## 2019-04-09 LAB — RAPID URINE DRUG SCREEN, HOSP PERFORMED
Amphetamines: NOT DETECTED
Barbiturates: NOT DETECTED
Benzodiazepines: NOT DETECTED
Cocaine: NOT DETECTED
Opiates: NOT DETECTED
Tetrahydrocannabinol: NOT DETECTED

## 2019-04-09 MED ORDER — ALUM & MAG HYDROXIDE-SIMETH 200-200-20 MG/5ML PO SUSP: 30.0000 mL | ORAL | Status: DC | PRN

## 2019-04-09 MED ORDER — ACETAMINOPHEN 325 MG PO TABS: 650.0000 mg | ORAL_TABLET | Freq: Four times a day (QID) | ORAL | Status: DC | PRN

## 2019-04-09 MED ORDER — LORAZEPAM 2 MG/ML IJ SOLN
2.0000 mg | Freq: Once | INTRAMUSCULAR | Status: AC
  Administered 2019-04-09: 2 mg via INTRAMUSCULAR
  Filled 2019-04-09: qty 1

## 2019-04-09 MED ORDER — TRAZODONE HCL 50 MG PO TABS: 50.0000 mg | ORAL_TABLET | Freq: Every evening | ORAL | Status: DC | PRN

## 2019-04-09 MED ORDER — HYDROXYZINE HCL 25 MG PO TABS: 25.0000 mg | ORAL_TABLET | Freq: Three times a day (TID) | ORAL | Status: DC | PRN

## 2019-04-09 MED ORDER — MAGNESIUM HYDROXIDE 400 MG/5ML PO SUSP: 30.0000 mL | Freq: Every day | ORAL | Status: DC | PRN

## 2019-04-09 NOTE — ED Notes (Signed)
Pt displaying manic behavior, asking sitter to turn on/off the light several times.  Pt very loud, laughing to self.  Pt keeps repeating "I'm not scared of you" to RN.  Provider bedside.[name]

## 2019-04-09 NOTE — ED Notes (Signed)
Had requested breast pump but is asleep now, pump available when pt wakes

## 2019-04-09 NOTE — ED Notes (Signed)
Pt continues to appear to respond to internal stimuli, saying "you are not welcome here."  At times she appears to be having a conversation w/ someone in the room who is not there.

## 2019-04-09 NOTE — ED Notes (Signed)
Called for breast pump kit.

## 2019-04-09 NOTE — ED Notes (Signed)
Belongings inventoried and placed in locker 8. Pt remains in sweatpants without strings d/t scrub shortage.

## 2019-04-09 NOTE — Progress Notes (Signed)
Psychoeducational Group Note  Date:  04/09/2019 Time:  2039  Group Topic/Focus:  Wrap-Up Group:   The focus of this group is to help patients review their daily goal of treatment and discuss progress on daily workbooks.  Participation Level: Did Not Attend  Participation Quality:  Not Applicable  Affect:  Not Applicable  Cognitive:  Not Applicable  Insight:  Not Applicable  Engagement in Group: Not Applicable  Additional Comments:  The patient did not attend group this evening.   Archie Balboa S 04/09/2019, 8:39 PM

## 2019-04-09 NOTE — ED Notes (Signed)
Pt was displaying increasing agitation/pacing behaviors in room, discussed with Dr Delane Ginger appropriate to administer IM ativan ordered at 0230. Security at bedside to assist, pt was verbally aggressive with some members of staff but extremely cooperative with others, Animal nutritionist pt was willing to talk to deescalated patient and got pt to sit on the bed and willingly take IM injection. Pt quiet at this time but still pacing in room.

## 2019-04-09 NOTE — BH Assessment (Addendum)
Tele Assessment Note   Patient Name: Lindsay [name] MRN: [id number] Referring Physician: Raeford Razor, MD Location of Patient: [facility] Location of Provider: Behavioral Health TTS Department  Lindsay [name] is an 28 y.o. female who presents to the ED under IVC initiated by EDP. Per IVC, "mother called because of bizarre behavior. On arrival patient screaming mostly incoherently. Alternates between singing, yelling "Black Lives Matter", and talking about "Jesus Christ." She is verbally and physically threatening staff and police." Pt reports she does not recall everything that took place prior to coming to the ED, however she reports she felt angry and hostile but she does not recall the reason why. Pt states she feels that she had an "episode." Pt states she came to the ED because she needs a day to sleep. TTS asked the pt if she has been having difficulty sleeping and she alternates between "yes" and "no." Pt states she is "married to Jesus" and states she "works for the [employer] then states "I see you", out of the blue with no context, possibly responding to internal stimuli. Pt states she does not want to share her current living situation with TTS writer and declines to provide consent for TTS to speak with her family to obtain collateral information. Pt was asked if she has a current MH provider and she states "no, I don't want one or need one." Per chart review, pt recently gave birth in August 2020. Pt was admitted to [facility] in 2019 c/o Schizophrenia.   Per Lindsay Conn, NP pt is recommended for inpt tx. TTS to seek placement due to no appropriate beds at [facility]. EDP Lindsay Anis, PA-C and Gloster, Lindsay Gilford, RN have been advised.  Diagnosis: Schizophrenia  Past Medical History:  Past Medical History:  Diagnosis Date  . Anemia   . Asthma   . Homozygous alpha thalassemia (HCC)   . Hx of chlamydia infection   . Hx of scoliosis   . Pregnancy induced hypertension   . Schizophrenia [facility])      Past Surgical History:  Procedure Laterality Date  . CESAREAN SECTION N/A 01/25/2019   Procedure: CESAREAN SECTION;  Surgeon: Lindsay Graff, MD;  Location: [facility] LD ORS;  Service: Obstetrics;  Laterality: N/A;  . TONSILLECTOMY      Family History:  Family History  Problem Relation Age of Onset  . Cancer Father        liver  . Hypertension Mother   . Diabetes Maternal Grandmother   . Cancer Maternal Grandmother     Social History:  reports that she has never smoked. She has never used smokeless tobacco. She reports that she does not drink alcohol or use drugs.  Additional Social History:  Alcohol / Drug Use Pain Medications: See MAR Prescriptions: See MAR Over the Counter: See MAR History of alcohol / drug use?: No history of alcohol / drug abuse  CIWA: CIWA-Ar BP: 107/83 Pulse Rate: 88 COWS:    Allergies: No Known Allergies  Home Medications: (Not in a hospital admission)   OB/GYN Status:  Patient's last menstrual period was 05/10/2018.  General Assessment Data Location of Assessment: [facility] ED TTS Assessment: In system Is this a Tele or Face-to-Face Assessment?: Tele Assessment Is this an Initial Assessment or a Re-assessment for this encounter?: Initial Assessment Patient Accompanied by:: N/A Language Other than English: No Living Arrangements: Other (Comment) What gender do you identify as?: Female Marital status: Single Pregnancy Status: No Living Arrangements: Other (Comment)(pt refused to disclose) Can pt  return to current living arrangement?: Yes Admission Status: Involuntary Petitioner: ED Attending Is patient capable of signing voluntary admission?: No Referral Source: Self/Family/Friend Insurance type: BCBS     Crisis Care Plan Living Arrangements: Other (Comment)(pt refused to disclose) Name of Psychiatrist: none Name of Therapist: none  Education Status Is patient currently in school?: No Is the patient employed, unemployed or receiving  disability?: Employed  Risk to self with the past 6 months Suicidal Ideation: No Has patient been a risk to self within the past 6 months prior to admission? : No Suicidal Intent: No Has patient had any suicidal intent within the past 6 months prior to admission? : No Is patient at risk for suicide?: No Suicidal Plan?: No Has patient had any suicidal plan within the past 6 months prior to admission? : No Access to Means: No What has been your use of drugs/alcohol within the last 12 months?: denies Previous Attempts/Gestures: No Triggers for Past Attempts: None known Intentional Self Injurious Behavior: None Family Suicide History: No Recent stressful life event(s): Other (Comment)(manic) Persecutory voices/beliefs?: No Depression: No Substance abuse history and/or treatment for substance abuse?: No Suicide prevention information given to non-admitted patients: Not applicable  Risk to Others within the past 6 months Homicidal Ideation: No Does patient have any lifetime risk of violence toward others beyond the six months prior to admission? : Yes (comment)(pt aggressive in the ED with staff) Thoughts of Harm to Others: No Current Homicidal Intent: No Current Homicidal Plan: No Access to Homicidal Means: No History of harm to others?: No Assessment of Violence: None Noted Does patient have access to weapons?: No Criminal Charges Pending?: No Does patient have a court date: No Is patient on probation?: No  Psychosis Hallucinations: Auditory(pt seen responding to internal stimuli) Delusions: Unspecified  Mental Status Report Appearance/Hygiene: Unremarkable Eye Contact: Good Motor Activity: Freedom of movement Speech: Rapid, Aggressive Level of Consciousness: Alert Mood: Labile, Preoccupied Affect: Labile, Preoccupied Anxiety Level: None Thought Processes: Flight of Ideas Judgement: Impaired Orientation: Person, Time, Place Obsessive Compulsive Thoughts/Behaviors:  None  Cognitive Functioning Concentration: Fair Memory: Remote Intact, Recent Intact Is patient IDD: No Insight: Poor Impulse Control: Poor Appetite: Good Have you had any weight changes? : No Change Sleep: No Change Total Hours of Sleep: 6 Vegetative Symptoms: None  ADLScreening [facility] Assessment Services) Patient's cognitive ability adequate to safely complete daily activities?: Yes Patient able to express need for assistance with ADLs?: Yes Independently performs ADLs?: Yes (appropriate for developmental age)  Prior Inpatient Therapy Prior Inpatient Therapy: Yes Prior Therapy Dates: 2019 [facility] Reason for Treatment: Schizophrenia  Prior Outpatient Therapy Prior Outpatient Therapy: No Does patient have an ACCT team?: No Does patient have Intensive In-House Services?  : No Does patient have [facility] services? : No Does patient have P4CC services?: No  ADL Screening (condition at time of admission) Patient's cognitive ability adequate to safely complete daily activities?: Yes Is the patient deaf or have difficulty hearing?: No Does the patient have difficulty seeing, even when wearing glasses/contacts?: No Does the patient have difficulty concentrating, remembering, or making decisions?: No Patient able to express need for assistance with ADLs?: Yes Does the patient have difficulty dressing or bathing?: No Independently performs ADLs?: Yes (appropriate for developmental age) Does the patient have difficulty walking or climbing stairs?: No Weakness of Legs: None Weakness of Arms/Hands: None  Home Assistive Devices/Equipment Home Assistive Devices/Equipment: None    Abuse/Neglect Assessment (Assessment to be complete while patient is alone) Abuse/Neglect  Assessment Can Be Completed: Yes Physical Abuse: Denies Verbal Abuse: Denies Sexual Abuse: Denies Exploitation of patient/patient's resources: Denies Self-Neglect: Denies     Armed forces training and education officer (For Healthcare) Does Patient Have a Medical Advance Directive?: No Would patient like information on creating a medical advance directive?: No - Patient declined          Disposition: Per Lindon Romp, NP pt is recommended for inpt tx. TTS to seek placement due to no appropriate beds at [facility]. EDP Charlann Lange, PA-C and Gloster, Marveen Reeks, RN have been advised.   Disposition Initial Assessment Completed for this Encounter: Yes Disposition of Patient: Admit Type of inpatient treatment program: Adult Patient refused recommended treatment: No  This service was provided via telemedicine using a 2-way, interactive audio and video technology.  Names of all persons participating in this telemedicine service and their role in this encounter. Name: [name] Role: Patient  Name: Lind Covert Role: TTS          Lyanne Co 04/09/2019 3:31 AM

## 2019-04-09 NOTE — ED Notes (Signed)
Patient refused to eat her breakfast, So she asked could she put it in trash. I told her if she didn't want it, she can.

## 2019-04-09 NOTE — ED Provider Notes (Signed)
1:10 - Called to [place] [facility] to evaluate escalating symptoms of mania on patient. She is singing loudly, interruptive, hyperverbal. Minimally directable. Review of chart shows order for Ativan IM entered but expired. Order re-entered. She received Geodon earlier, however, this is not felt necessary at this time. TTS pending.   Charlann Lange, PA-C 04/09/19 0227    Ward, Delice Bison, DO 04/09/19 0302

## 2019-04-09 NOTE — ED Notes (Addendum)
[facility] wanted to start TTS.  Also requested that we fax IVC paperwork to them which was done. Pt was resting quietly but woke up w/o incident

## 2019-04-09 NOTE — ED Notes (Signed)
Placed pt on hospital bed for comfort. Pt is displaying manic behavior, loud, laughing

## 2019-04-09 NOTE — [facility] Counselor (Signed)
Patient is tentatively accepted to [facility] 507-1 pending discharges. TTS to contact RN with accepting information at that time.

## 2019-04-09 NOTE — ED Notes (Signed)
Pt talking to her baby's father, Elberta Fortis. Second phone call for the day.

## 2019-04-09 NOTE — ED Notes (Signed)
Patient was given 2 cups of water and [employer].

## 2019-04-09 NOTE — [facility] Counselor (Signed)
Patient is accepted to [facility]. Call report to [phone]. Patient may come ASAP. Lindsay Petties, RN notified. Per Lindsay Petties, RN patient is breast feeding and her mother will bring pump to [facility].

## 2019-04-09 NOTE — ED Notes (Signed)
Pt spoke with mother on the phone and gave this RN permission to speak with her, updated mother on plan of care and visiting policy at [facility]

## 2019-04-09 NOTE — ED Notes (Signed)
Patient was given [employer], and pad

## 2019-04-09 NOTE — ED Notes (Signed)
Regular Diet was ordered for Lunch. 

## 2019-04-09 NOTE — ED Notes (Signed)
Pt noted to still be in her own clothing. Pt changed out of her shirt and wire bra into hospital shirt. Remains with sweat pants without strings on. Assisted pt to set up breast pump. Pt sitting on side of bed pumping at this time. Updated pt's mother who called, requesting to talk to patient who agrees. First five minute phone call of the day.

## 2019-04-09 NOTE — Progress Notes (Signed)
Admission Note: Patient is a 28 year old female who is admitted to the unit for bizarre behavior.  Patient is loud and hyper religious.  Admission plan of care reviewed and consent signed.  Skin assessment and personal belongings completed.  Skin is dry and intact.  No contraband found.  States she is here to get rest and therapy.  Patient is oriented to the unit, room and staff.  Routine safety checks initiated.  Verbalizes understanding of unit rules and protocols.  Patient is safe on the unit.

## 2019-04-09 NOTE — ED Notes (Signed)
Patient was given a cup of ice water. 

## 2019-04-09 NOTE — ED Notes (Signed)
TTS ended 

## 2019-04-09 NOTE — ED Notes (Signed)
Pt pumped 120 ml breast milk. Attachments washed and are air drying at nurses' station.

## 2019-04-09 NOTE — ED Notes (Signed)
Patient was given a snack and drink. 

## 2019-04-10 ENCOUNTER — Other Ambulatory Visit: Payer: Self-pay

## 2019-04-10 ENCOUNTER — Encounter (HOSPITAL_COMMUNITY): Payer: Self-pay

## 2019-04-10 DIAGNOSIS — F200 Paranoid schizophrenia: Secondary | ICD-10-CM

## 2019-04-10 IMAGING — US US OB < 14 WEEKS - US OB TV
1 series · 15 of 28 positions shown · non-contrast
Comparison: None

CLINICAL DATA: Vaginal bleeding in first trimester of pregnancy

EXAM:
OBSTETRIC <14 WK US AND TRANSVAGINAL OB US
TECHNIQUE: Both transabdominal and transvaginal ultrasound examinations were
performed for complete evaluation of the gestation as well as the
maternal uterus, adnexal regions, and pelvic cul-de-sac.
Transvaginal technique was performed to assess early pregnancy.

[Series 1: us ob < 14 weeks - us ob tv · 36 acquisitions, 15 frames shown]
[im 1/36]
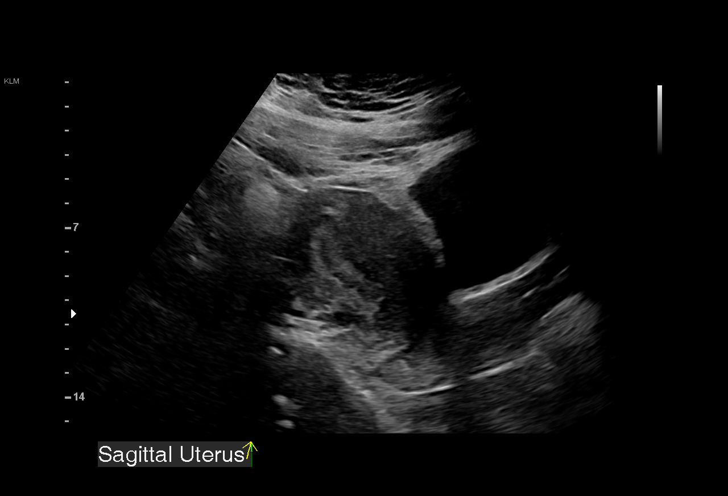
[im 3/36]
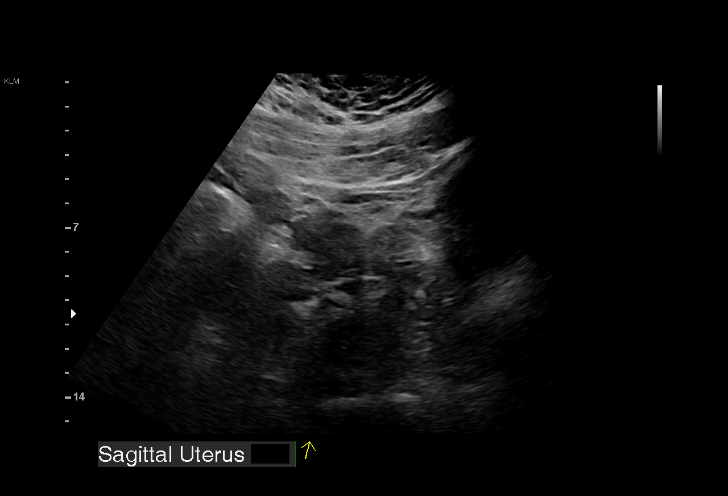
[im 6/36]
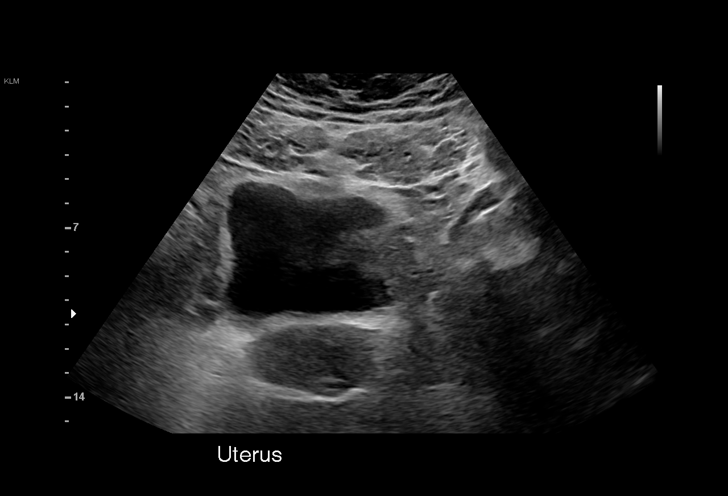
[im 8/36]
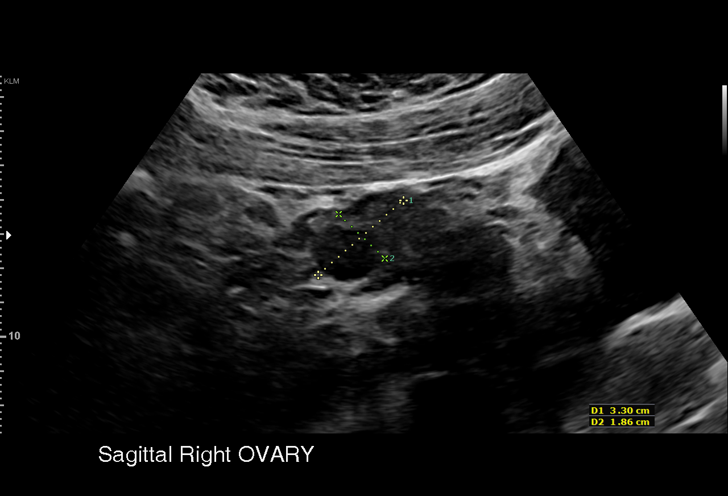
[im 11/36]
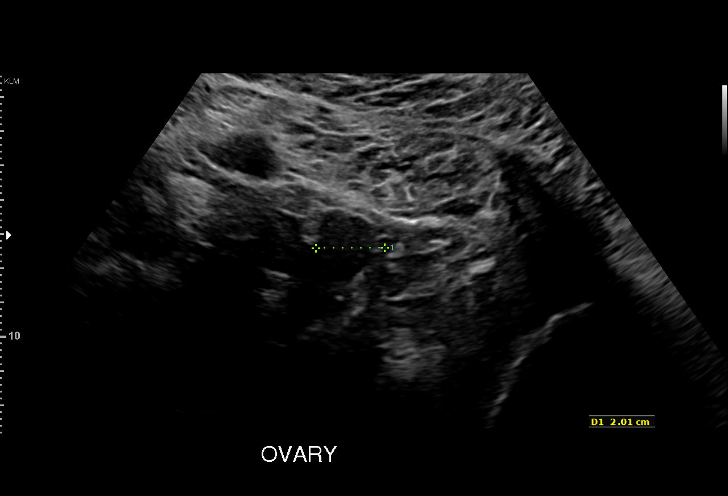
[im 13/36]
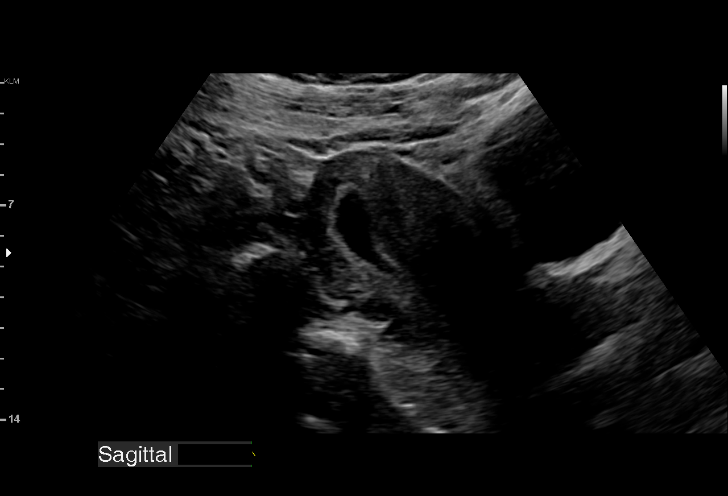
[im 16/36]
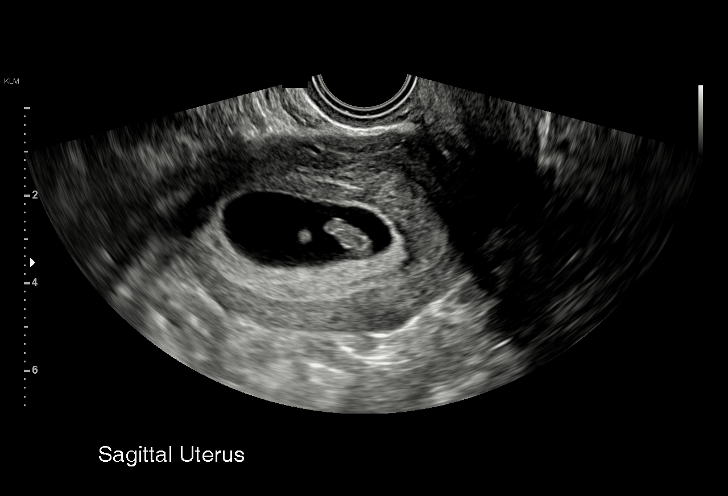
[im 19/36]
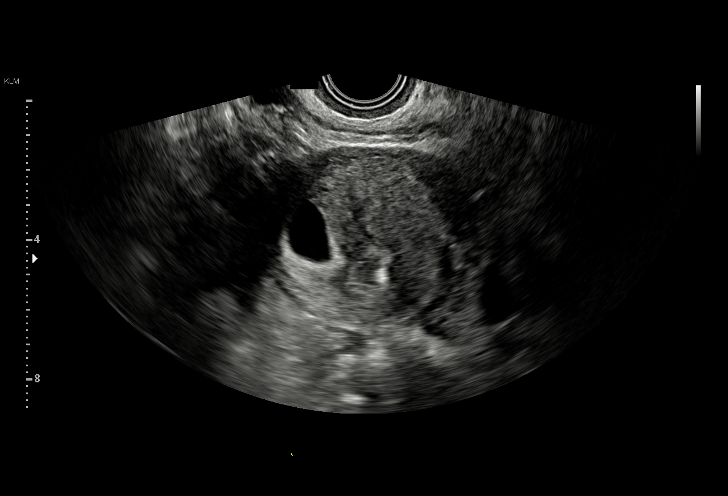
[im 20/36]
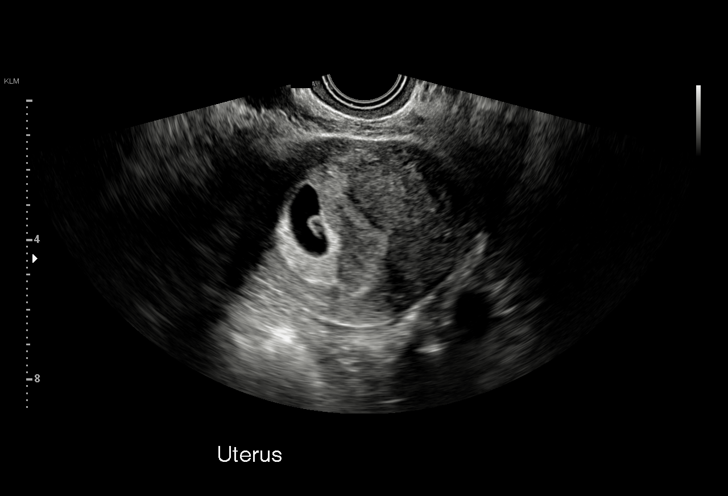
[im 23/36]
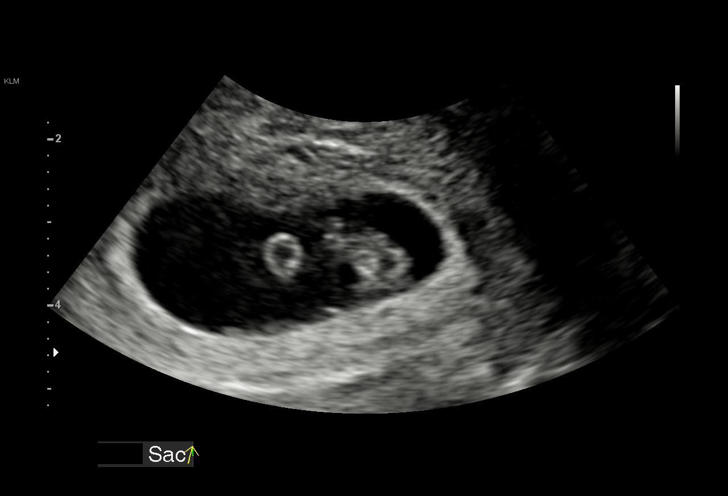
[im 25/36]
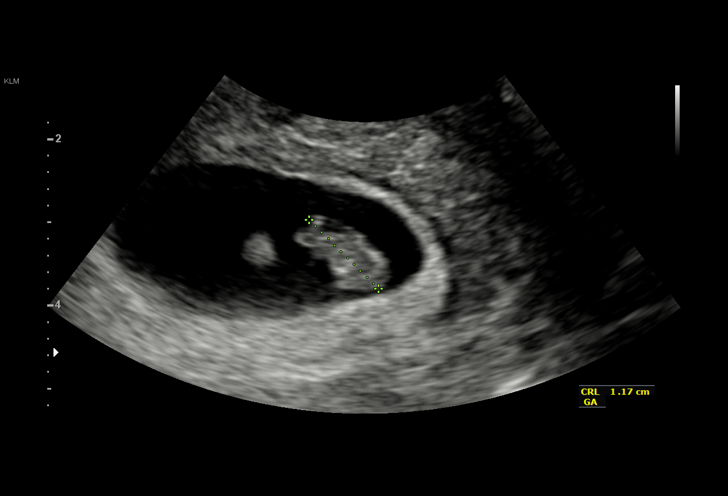
[im 28/36]
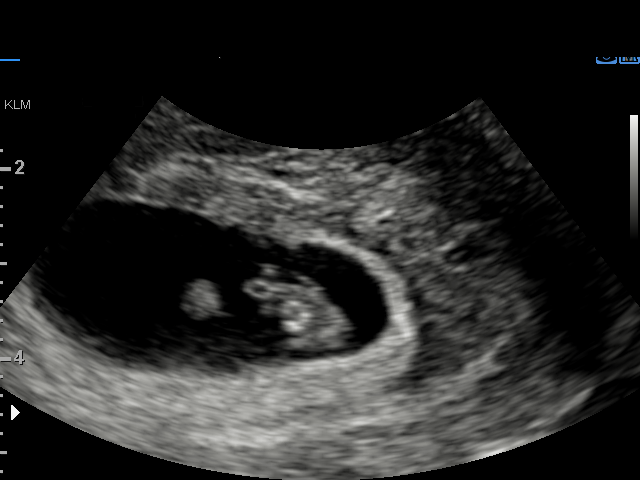
[im 30/36]
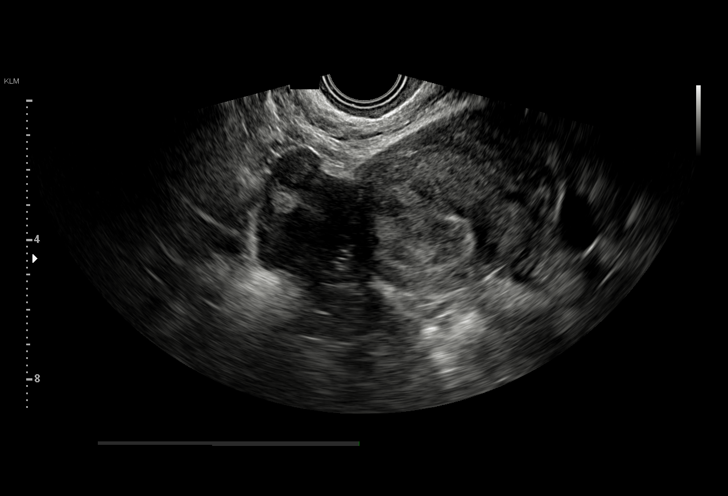
[im 33/36]
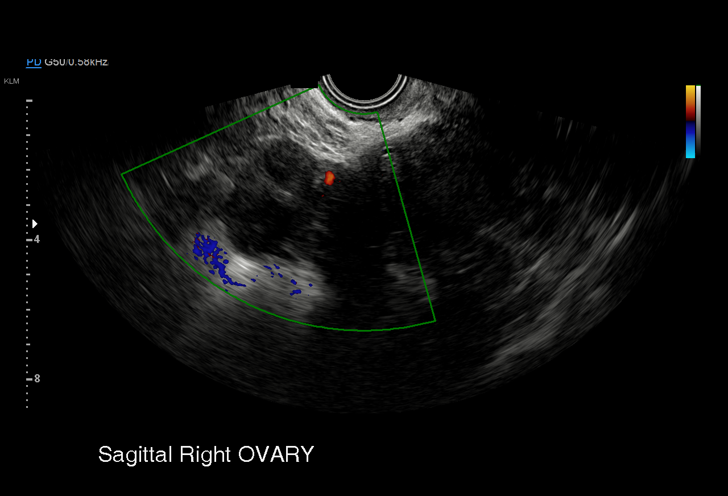
[im 36/36]
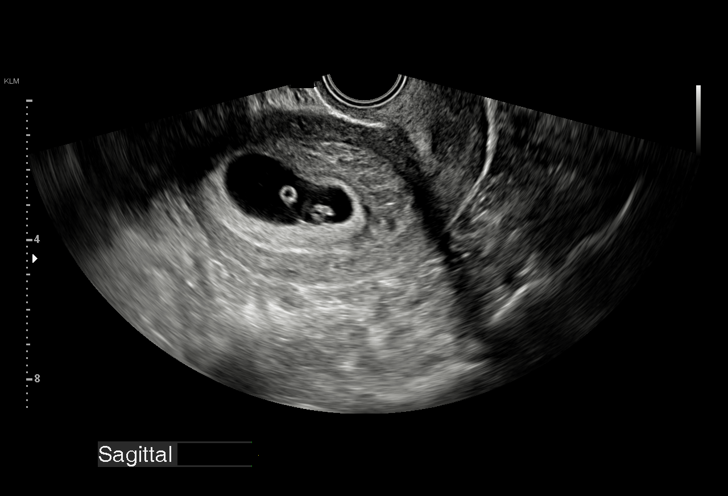

[15 of 28 positions shown; findings below may reference images not displayed]

FINDINGS: Intrauterine gestational sac: Present, single

Yolk sac:  Present

Embryo:  Present

Cardiac Activity: Present

Heart Rate: 155 bpm

CRL:  12 mm   7 w   3 d                  US EDC: 02/16/2019

Subchorionic hemorrhage:  None visualized.

Maternal uterus/adnexae:

RIGHT ovary normal size and morphology, 3.5 x 3.7 x 2.6 cm.

LEFT ovary normal size and morphology, 1.8 x 2.7 x 1.9 cm.

No free pelvic fluid or adnexal masses
IMPRESSION: Single live intrauterine gestation at 7 weeks 3 days EGA by
crown-rump length.

No acute abnormalities.

## 2019-04-10 MED ORDER — LORAZEPAM 2 MG/ML IJ SOLN
2.0000 mg | INTRAMUSCULAR | Status: DC
  Filled 2019-04-10: qty 1

## 2019-04-10 MED ORDER — LORAZEPAM 1 MG PO TABS
2.0000 mg | ORAL_TABLET | ORAL | Status: DC
  Administered 2019-04-10 – 2019-04-12 (×5): 2 mg via ORAL
  Filled 2019-04-10 (×6): qty 2

## 2019-04-10 MED ORDER — ZIPRASIDONE MESYLATE 20 MG IM SOLR: 20.0000 mg | Freq: Four times a day (QID) | INTRAMUSCULAR | Status: DC | PRN

## 2019-04-10 MED ORDER — RISPERIDONE 2 MG PO TABS
4.0000 mg | ORAL_TABLET | Freq: Two times a day (BID) | ORAL | Status: DC
  Administered 2019-04-10 – 2019-04-12 (×4): 4 mg via ORAL
  Filled 2019-04-10 (×7): qty 2

## 2019-04-10 MED ORDER — BENZTROPINE MESYLATE 0.5 MG PO TABS
0.5000 mg | ORAL_TABLET | Freq: Two times a day (BID) | ORAL | Status: DC
  Administered 2019-04-10 – 2019-04-12 (×5): 0.5 mg via ORAL
  Filled 2019-04-10 (×9): qty 1

## 2019-04-10 MED ORDER — TEMAZEPAM 30 MG PO CAPS
30.0000 mg | ORAL_CAPSULE | Freq: Every day | ORAL | Status: DC
  Administered 2019-04-10 – 2019-04-12 (×2): 30 mg via ORAL
  Filled 2019-04-10 (×3): qty 1

## 2019-04-10 MED ORDER — RISPERIDONE 3 MG PO TABS
3.0000 mg | ORAL_TABLET | Freq: Two times a day (BID) | ORAL | Status: DC
  Administered 2019-04-10: 3 mg via ORAL
  Filled 2019-04-10 (×4): qty 1

## 2019-04-10 MED ORDER — CARBAMAZEPINE 100 MG PO CHEW
100.0000 mg | CHEWABLE_TABLET | Freq: Three times a day (TID) | ORAL | Status: DC
  Administered 2019-04-10 – 2019-04-12 (×6): 100 mg via ORAL
  Filled 2019-04-10 (×13): qty 1

## 2019-04-10 NOTE — Progress Notes (Signed)
The patient's coping skill is to go to church and to be with friends and family. Her goal for tomorrow is to read from the Bible. Patient states that she has been "saved".

## 2019-04-10 NOTE — Tx Team (Signed)
Initial Treatment Plan 04/10/2019 12:35 AM Lindsay [name] LSL:[id number]    PATIENT STRESSORS: Financial difficulties Medication change or noncompliance   PATIENT STRENGTHS: General fund of knowledge Supportive family/friends   PATIENT IDENTIFIED PROBLEMS:  psychosis  "relationship with Jesus Christ"                   DISCHARGE CRITERIA:  Improved stabilization in mood, thinking, and/or behavior Verbal commitment to aftercare and medication compliance  PRELIMINARY DISCHARGE PLAN: Attend aftercare/continuing care group Outpatient therapy  PATIENT/FAMILY INVOLVEMENT: This treatment plan has been presented to and reviewed with the patient, Lindsay [name].  The patient and family have been given the opportunity to ask questions and make suggestions.  Providence Crosby, RN 04/10/2019, 12:35 AM

## 2019-04-10 NOTE — Progress Notes (Signed)
Recreation Therapy Notes  Date: 10.20.20 Time: 0955 Location: 500 Hall Dayroom  Group Topic: Triggers  Goal Area(s) Addresses:  Patient will identify triggers. Patient will identify how to avoid triggers. Patient will identify how to deal with triggers head on.  Intervention: Worksheet, music  Activity: Triggers.  Patients were to identify their biggest triggers.  Patients would then identify ways to avoid/reduce exposure to triggers and how to face triggers head on.  Education: Triggers, Discharge Planning  Education Outcome: Acknowledges understanding/In group clarification offered/Needs additional education.   Clinical Observations/Feedback: Pt did not attend group session.    Keyshun Elpers, LRT/CTRS         Jorge Amparo A 04/10/2019 11:46 AM 

## 2019-04-10 NOTE — Progress Notes (Signed)
[facility] Second Physician Opinion Progress Note for Medication Administration to Non-consenting Patients (For Involuntarily Committed Patients)  Patient: Lindsay [name] Date of Birth: [birth year] MRN: [id number]  Reason for the Medication: The patient, without the benefit of the specific treatment measure, is incapable of participating in any available treatment plan that will give the patient a realistic opportunity of improving the patient's condition.  Consideration of Side Effects: Consideration of the side effects related to the medication plan has been given.  Rationale for Medication Administration: Patient is seen and examined.  Patient is a 28 year old female with a past psychiatric history significant for schizophrenia who was admitted on 04/10/2019.  She was placed under involuntary commitment secondary to disorganized thoughts, bizarre behavior, mania, hyper religiosity, bizarre and delusional statements.  The patient had her first admission here in May 2019 and was diagnosed with her first break of schizophrenia.  She was given the long-acting Abilify injection at that time because refusing medications.  She is readmitted and again refusing medications.  Dr. Jake Samples asked me to examine the patient for possible forced medication treatment.  Fortunately this morning she did take the Risperdal.  On my examination today she just repeats the word "Jesus".  Although she has taken medication this a.m., I am concerned that she will once again refuse medications during the course the hospitalization.  My examination today concludes that she will deteriorate and do far worse without forced medication.  I agree with the forced medication protocol.    Sharma Covert, MD 04/10/19  1:10 PM   This documentation is good for (7) seven days from the date of the MD signature. New documentation must be completed every seven (7) days with detailed justification in the medical record if the patient requires continued  non-emergent administration of psychotropic medications.

## 2019-04-10 NOTE — Progress Notes (Signed)
D: Pt denies SI/HI/AVH. Pt is pleasant and cooperative most of the evening. Pt a little stand offish , but was compliant this evening.   A: Pt was offered support and encouragement. Pt was given scheduled night medication. Pt was encourage to attend groups. Q 15 minute checks were done for safety.   R: safety maintained on unit.

## 2019-04-10 NOTE — BHH Suicide Risk Assessment (Signed)
[facility] Admission Suicide Risk Assessment   Nursing information obtained from:  Patient Demographic factors:  Adolescent or young adult Current Mental Status:  NA Loss Factors:  Financial problems / change in socioeconomic status Historical Factors:  NA Risk Reduction Factors:  Sense of responsibility to family  Total Time spent with patient: 45 minutes Principal Problem: Exacerbation of psychotic disorder in postpartum phase Diagnosis:  Active Problems:   Schizophrenia ([place])  Subjective Data: Second edition here for similar presentation of paranoia/mania/delusional beliefs disorganized thought and behavior Continued Clinical Symptoms:  Alcohol Use Disorder Identification Test Final Score (AUDIT): 0 The "Alcohol Use Disorders Identification Test", Guidelines for Use in Primary Care, Second Edition.  World Pharmacologist [facility]). Score between 0-7:  no or low risk or alcohol related problems. Score between 8-15:  moderate risk of alcohol related problems. Score between 16-19:  high risk of alcohol related problems. Score 20 or above:  warrants further diagnostic evaluation for alcohol dependence and treatment.   CLINICAL FACTORS:   Schizophrenia:   Less than 107 years old Paranoid or undifferentiated type  Musculoskeletal: Strength & Muscle Tone: within normal limits Gait & Station: normal Patient leans: N/A  Psychiatric Specialty Exam: Physical Exam  Nursing note and vitals reviewed. Constitutional: She appears well-developed and well-nourished.    Review of Systems  Constitutional: Negative.   Cardiovascular: Negative.   Gastrointestinal: Negative.   Genitourinary: Negative.   Endo/Heme/Allergies: Negative.     Blood pressure 130/81, pulse (!) 113, temperature 98.7 F (37.1 C), temperature source Oral, resp. rate 20, height 5\' 3"  (1.6 m), weight 127.9 kg, last menstrual period 05/10/2018, SpO2 100 %.Body mass index is 49.95 kg/m.  General Appearance: Disheveled  Eye  Contact:  Poor  Speech:  Pressured  Volume:  Increased  Mood:  Irritable and manic  Affect:  Labile  Thought Process:  Disorganized and Descriptions of Associations: Loose  Orientation:  Other:  Will not answer  Thought Content:  Illogical, Delusions and Tangential  Suicidal Thoughts:  No  Homicidal Thoughts:  No  Memory:  Immediate;   Poor Recent;   Poor Remote;   Poor  Judgement:  Impaired  Insight:  Lacking  Psychomotor Activity:  Increased  Concentration:  Concentration: Poor and Attention Span: Poor  Recall:  Poor  Fund of Knowledge:  Poor  Language:  Poor  Akathisia:  Negative  Handed:  Right  AIMS (if indicated):     Assets:  Housing Leisure Time Physical Health Resilience Social Support  ADL's:  Intact  Cognition:  WNL  Sleep:  Number of Hours: 5    COGNITIVE FEATURES THAT CONTRIBUTE TO RISK:  Closed-mindedness and Loss of executive function    SUICIDE RISK:   Minimal: No identifiable suicidal ideation.  Patients presenting with no risk factors but with morbid ruminations; may be classified as minimal risk based on the severity of the depressive symptoms  PLAN OF CARE: Admit for stabilization  I certify that inpatient services furnished can reasonably be expected to improve the patient's condition.   Johnn Hai, MD 04/10/2019, 7:29 AM

## 2019-04-10 NOTE — H&P (Addendum)
Psychiatric Admission Assessment Adult  Patient Identification: Lindsay [name] MRN:  [id number] Date of Evaluation:  04/10/2019 Chief Complaint:  SCHIZOPHRENIA Principal Diagnosis: Exacerbation of psychotic disorder in the context of a postpartum phase Diagnosis:  Active Problems:   Schizophrenia (HCC)  History of Present Illness:   This is the second psychiatric admission at our facility for Lindsay [name], she is 28 years of age, has been diagnosed with a schizophrenic condition and she has required petition for involuntary commitment.  Symptoms include disorganized thought and behavior, mania, hyperreligiosity, bizarre and delusional statements, and she recently gave birth to a son on 01/26/2019 She presents with a negative drug screen.  She is uncooperative with my interview process as discussed below due to her psychosis. She is making bizarre and hyper religious statements during the mental status exam  Past history involves her first admission here in May 2019, she was diagnosed with her first break of schizophrenia, was ordered Abilify long-acting injectable due to refusal of medications but was also discharged with the long-acting, 400 mg monthly / order- but did not take. "was stable on" oral-   Was stable and meds tapered and stopped over 2019-  Those she had presented in 2019, her symptoms really began by around age 38, and significantly worsened by the fall 2018-her initial presentation was 1 of paranoia that forced her to live outside of an apartment in her car that progressed to more agitation, disorganized thought and behavior, and then a presentation more like the one we see on this admission.  On mental status exam she is alert refuses to tell me her name, makes nonsensical statements, for example when asking her if she knows the day or the date she states "I am going to sleep now" while she is alert and talking, she walks off from the interview, she began singing hymns, and again  makes unusual statements that are disjointed.  She is at times giddy at other times disrespectful, and again refusing to cooperate and stating "I do not take medicines" and "I will drink when the spirit tells me"  Associated Signs/Symptoms: Depression Symptoms:  psychomotor agitation, (Hypo) Manic Symptoms:  Delusions, Distractibility, Anxiety Symptoms:  n/a Psychotic Symptoms:  Delusions, PTSD Symptoms: NA Total Time spent with patient: 45 minutes  Past Psychiatric History: As above last here in May 2019 but her initial presentation was probably in her early 27s and her condition has progressed  Is the patient at risk to self? Yes.    Has the patient been a risk to self in the past 6 months? No.  Has the patient been a risk to self within the distant past? Yes.    Is the patient a risk to others? No.  Has the patient been a risk to others in the past 6 months? No.  Has the patient been a risk to others within the distant past? No.   Prior Inpatient Therapy:  Lost to follow-up after last admission Prior Outpatient Therapy:  Lost to follow-up  Alcohol Screening: 1. How often do you have a drink containing alcohol?: Never 2. How many drinks containing alcohol do you have on a typical day when you are drinking?: 1 or 2 3. How often do you have six or more drinks on one occasion?: Never AUDIT-C Score: 0 4. How often during the last year have you found that you were not able to stop drinking once you had started?: Never 5. How often during the last year have you failed to  do what was normally expected from you becasue of drinking?: Never 6. How often during the last year have you needed a first drink in the morning to get yourself going after a heavy drinking session?: Never 7. How often during the last year have you had a feeling of guilt of remorse after drinking?: Never 8. How often during the last year have you been unable to remember what happened the night before because you had been  drinking?: Never 9. Have you or someone else been injured as a result of your drinking?: No 10. Has a relative or friend or a doctor or another health worker been concerned about your drinking or suggested you cut down?: No Alcohol Use Disorder Identification Test Final Score (AUDIT): 0 Substance Abuse History in the last 12 months:  No. Consequences of Substance Abuse: NA Previous Psychotropic Medications: Yes  Psychological Evaluations: No  Past Medical History:  Past Medical History:  Diagnosis Date  . Anemia   . Asthma   . Homozygous alpha thalassemia (HCC)   . Hx of chlamydia infection   . Hx of scoliosis   . Pregnancy induced hypertension   . Schizophrenia [facility])     Past Surgical History:  Procedure Laterality Date  . CESAREAN SECTION N/A 01/25/2019   Procedure: CESAREAN SECTION;  Surgeon: Jaymes Graff, MD;  Location: [facility] LD ORS;  Service: Obstetrics;  Laterality: N/A;  . TONSILLECTOMY     Family History:  Family History  Problem Relation Age of Onset  . Cancer Father        liver  . Hypertension Mother   . Diabetes Maternal Grandmother   . Cancer Maternal Grandmother    Family Psychiatric  History: neg Tobacco Screening: Have you used any form of tobacco in the last 30 days? (Cigarettes, Smokeless Tobacco, Cigars, and/or Pipes): No Social History:  Social History   Substance and Sexual Activity  Alcohol Use No     Social History   Substance and Sexual Activity  Drug Use No    Additional Social History:                           Allergies:  No Known Allergies Lab Results:  Results for orders placed or performed during the hospital encounter of 04/08/19 (from the past 48 hour(s))  Comprehensive metabolic panel     Status: Abnormal   Collection Time: 04/08/19  2:15 PM  Result Value Ref Range   Sodium 135 135 - 145 mmol/L   Potassium 4.0 3.5 - 5.1 mmol/L   Chloride 105 98 - 111 mmol/L   CO2 17 (L) 22 - 32 mmol/L   Glucose, Bld 107 (H) 70 - 99  mg/dL   BUN 8 6 - 20 mg/dL   Creatinine, Ser 0.10 (H) 0.44 - 1.00 mg/dL   Calcium 9.5 8.9 - 27.2 mg/dL   Total Protein 7.6 6.5 - 8.1 g/dL   Albumin 4.1 3.5 - 5.0 g/dL   AST 24 15 - 41 U/L   ALT 17 0 - 44 U/L   Alkaline Phosphatase 81 38 - 126 U/L   Total Bilirubin 1.6 (H) 0.3 - 1.2 mg/dL   GFR calc non Af Amer >60 >60 mL/min   GFR calc Af Amer >60 >60 mL/min   Anion gap 13 5 - 15    Comment: Performed at [facility], [address], [place], [place] [address]  Ethanol     Status: None  Collection Time: 04/08/19  2:15 PM  Result Value Ref Range   Alcohol, Ethyl (B) <10 <10 mg/dL    Comment: (NOTE) Lowest detectable limit for serum alcohol is 10 mg/dL. For medical purposes only. Performed at [facility], [place] [address] [place], [place] [address]   Salicylate level     Status: None   Collection Time: 04/08/19  2:15 PM  Result Value Ref Range   Salicylate Lvl <1.9 2.8 - 30.0 mg/dL    Comment: Performed at [facility] [address], [place], [place] [address]  Acetaminophen level     Status: Abnormal   Collection Time: 04/08/19  2:15 PM  Result Value Ref Range   Acetaminophen (Tylenol), Serum <10 (L) 10 - 30 ug/mL    Comment: (NOTE) Therapeutic concentrations vary significantly. A range of 10-30 ug/mL  may be an effective concentration for many patients. However, some  are best treated at concentrations outside of this range. Acetaminophen concentrations >150 ug/mL at 4 hours after ingestion  and >50 ug/mL at 12 hours after ingestion are often associated with  toxic reactions. Performed at [facility], [place] [address] [place], [place] [address]   SARS CORONAVIRUS 2 (TAT 6-24 HRS) Nasopharyngeal Nasopharyngeal Swab     Status: None   Collection Time: 04/08/19  3:48 PM   Specimen: Nasopharyngeal Swab  Result Value Ref Range   SARS Coronavirus 2 NEGATIVE NEGATIVE    Comment: (NOTE) SARS-CoV-2 target nucleic acids are NOT DETECTED. The  SARS-CoV-2 RNA is generally detectable in upper and lower respiratory specimens during the acute phase of infection. Negative results do not preclude SARS-CoV-2 infection, do not rule out co-infections with other pathogens, and should not be used as the sole basis for treatment or other patient management decisions. Negative results must be combined with clinical observations, patient history, and epidemiological information. The expected result is Negative. Fact Sheet for Patients: SugarRoll.be Fact Sheet for Healthcare Providers: https://www.woods-mathews.com/ This test is not yet approved or cleared by the [place] FDA and  has been authorized for detection and/or diagnosis of SARS-CoV-2 by FDA under an Emergency Use Authorization (EUA). This EUA will remain  in effect (meaning this test can be used) for the duration of the COVID-19 declaration under Section 56 4(b)(1) of the Act, 21 U.S.C. section 360bbb-3(b)(1), unless the authorization is terminated or revoked sooner. Performed at [facility], [place] [address], [place], [place] [address]   I-Stat beta hCG blood, ED     Status: None   Collection Time: 04/08/19  5:10 PM  Result Value Ref Range   I-stat hCG, quantitative <5.0 <5 mIU/mL   Comment 3            Comment:   GEST. AGE      CONC.  (mIU/mL)   <=1 WEEK        5 - 50     2 WEEKS       50 - 500     3 WEEKS       100 - 10,000     4 WEEKS     1,000 - 30,000        FEMALE AND NON-PREGNANT FEMALE:     LESS THAN 5 mIU/mL   CBC     Status: Abnormal   Collection Time: 04/08/19  5:10 PM  Result Value Ref Range   WBC 11.3 (H) 4.0 - 10.5 K/uL   RBC 5.87 (H) 3.87 - 5.11 MIL/uL  Hemoglobin 12.2 12.0 - 15.0 g/dL   HCT 16.140.2 09.636.0 - 04.546.0 %   MCV 68.5 (L) 80.0 - 100.0 fL   MCH 20.8 (L) 26.0 - 34.0 pg   MCHC 30.3 30.0 - 36.0 g/dL   RDW 40.916.3 (H) 81.111.5 - 91.415.5 %   Platelets 290 150 - 400 K/uL    Comment: REPEATED TO VERIFY   nRBC  0.0 0.0 - 0.2 %    Comment: Performed at [facility], [address] [place], [place] [address]  Rapid urine drug screen (hospital performed)     Status: None   Collection Time: 04/08/19 11:23 PM  Result Value Ref Range   Opiates NONE DETECTED NONE DETECTED   Cocaine NONE DETECTED NONE DETECTED   Benzodiazepines NONE DETECTED NONE DETECTED   Amphetamines NONE DETECTED NONE DETECTED   Tetrahydrocannabinol NONE DETECTED NONE DETECTED   Barbiturates NONE DETECTED NONE DETECTED    Comment: (NOTE) DRUG SCREEN FOR MEDICAL PURPOSES ONLY.  IF CONFIRMATION IS NEEDED FOR ANY PURPOSE, NOTIFY LAB WITHIN 5 DAYS. LOWEST DETECTABLE LIMITS FOR URINE DRUG SCREEN Drug Class                     Cutoff (ng/mL) Amphetamine and metabolites    1000 Barbiturate and metabolites    200 Benzodiazepine                 200 Tricyclics and metabolites     300 Opiates and metabolites        300 Cocaine and metabolites        300 THC                            50 Performed at [facility], [address] [place], [place] [address]     Blood Alcohol level:  Lab Results  Component Value Date   ETH <10 04/08/2019   ETH <10 10/18/2017    Metabolic Disorder Labs:  No results found for: HGBA1C, MPG No results found for: PROLACTIN No results found for: CHOL, TRIG, HDL, CHOLHDL, VLDL, LDLCALC  Current Medications: Current Facility-Administered Medications  Medication Dose Route Frequency Provider Last Rate Last Dose  . acetaminophen (TYLENOL) tablet 650 mg  650 mg Oral Q6H PRN Oneta RackLewis, Tanika N, NP      . alum & mag hydroxide-simeth (MAALOX/MYLANTA) 200-200-20 MG/5ML suspension 30 mL  30 mL Oral Q4H PRN Oneta RackLewis, Tanika N, NP      . benztropine (COGENTIN) tablet 0.5 mg  0.5 mg Oral BID Malvin JohnsFarah, Calisha Tindel, MD      . carbamazepine (TEGRETOL) chewable tablet 100 mg  100 mg Oral TID Malvin JohnsFarah, Judy Pollman, MD      . hydrOXYzine (ATARAX/VISTARIL) tablet 25 mg  25 mg Oral TID PRN Oneta RackLewis, Tanika N, NP      . LORazepam  (ATIVAN) tablet 2 mg  2 mg Oral Q4H Malvin JohnsFarah, Eliyanna Ault, MD       Or  . LORazepam (ATIVAN) injection 2 mg  2 mg Intramuscular Q4H Malvin JohnsFarah, Kimiya Brunelle, MD      . magnesium hydroxide (MILK OF MAGNESIA) suspension 30 mL  30 mL Oral Daily PRN Oneta RackLewis, Tanika N, NP      . risperiDONE (RISPERDAL) tablet 3 mg  3 mg Oral BID Malvin JohnsFarah, Jasalyn Frysinger, MD      . temazepam (RESTORIL) capsule 30 mg  30 mg Oral QHS Malvin JohnsFarah, Chrishauna Mee, MD      . traZODone (DESYREL) tablet  50 mg  50 mg Oral QHS PRN Oneta Rack, NP      . ziprasidone (GEODON) injection 20 mg  20 mg Intramuscular Q6H PRN Malvin Johns, MD       PTA Medications: Medications Prior to Admission  Medication Sig Dispense Refill Last Dose  . HYDROcodone-acetaminophen (NORCO/VICODIN) 5-325 MG tablet Take 1-2 tablets by mouth every 4 (four) hours as needed for moderate pain. (Patient not taking: Reported on 04/09/2019) 20 tablet 0   . ibuprofen (ADVIL) 600 MG tablet Take 1 tablet (600 mg total) by mouth every 6 (six) hours as needed for moderate pain. (Patient not taking: Reported on 04/09/2019) 30 tablet 0   . labetalol (NORMODYNE) 200 MG tablet Take 1 tablet (200 mg total) by mouth 2 (two) times daily. (Patient not taking: Reported on 04/09/2019) 60 tablet 0     Musculoskeletal: Strength & Muscle Tone: within normal limits Gait & Station: normal Patient leans: N/A  Psychiatric Specialty Exam: Physical Exam  Nursing note and vitals reviewed. Constitutional: She appears well-developed and well-nourished.    Review of Systems  Constitutional: Negative.   Cardiovascular: Negative.   Gastrointestinal: Negative.   Genitourinary: Negative.   Endo/Heme/Allergies: Negative.     Blood pressure 130/81, pulse (!) 113, temperature 98.7 F (37.1 C), temperature source Oral, resp. rate 20, height 5\' 3"  (1.6 m), weight 127.9 kg, last menstrual period 05/10/2018, SpO2 100 %.Body mass index is 49.95 kg/m.  General Appearance: Disheveled  Eye Contact:  Poor  Speech:  Pressured   Volume:  Increased  Mood:  Irritable and manic  Affect:  Labile  Thought Process:  Disorganized and Descriptions of Associations: Loose  Orientation:  Other:  Will not answer  Thought Content:  Illogical, Delusions and Tangential  Suicidal Thoughts:  No  Homicidal Thoughts:  No  Memory:  Immediate;   Poor Recent;   Poor Remote;   Poor  Judgement:  Impaired  Insight:  Lacking  Psychomotor Activity:  Increased  Concentration:  Concentration: Poor and Attention Span: Poor  Recall:  Poor  Fund of Knowledge:  Poor  Language:  Poor  Akathisia:  Negative  Handed:  Right  AIMS (if indicated):     Assets:  Housing Leisure Time Physical Health Resilience Social Support  ADL's:  Intact  Cognition:  WNL  Sleep:  Number of Hours: 5    Treatment Plan Summary: Daily contact with patient to assess and evaluate symptoms and progress in treatment and Medication management  Observation Level/Precautions:  15 minute checks  Laboratory:  UDS  Psychotherapy: Reality based med and illness education  Medications: Offered oral meds may need forced medications  Consultations: 15-minute precautions no need for consultation  Discharge Concerns: Longer term stability and compliance/safety of infant  Estimated LOS: 10-15  Other: 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 schizophrenia/postpartum contributor   Physician Treatment Plan for Primary Diagnosis: For psychosis and mania begin Risperdal and Tegretol/may need forced med consult if refuses medications Long Term Goal(s): Improvement in symptoms so as ready for discharge  Short Term Goals: Ability to disclose and discuss suicidal ideas, Ability to identify and develop effective coping behaviors will improve, Ability to maintain clinical measurements within normal limits will improve, Compliance with prescribed medications will improve and Ability to identify triggers associated with substance abuse/mental health issues will  improve  Physician Treatment Plan for Secondary Diagnosis: Active Problems:   Schizophrenia (HCC)  Long Term Goal(s): Improvement in symptoms so as ready for discharge  Short Term Goals: Ability  to disclose and discuss suicidal ideas, Ability to demonstrate self-control will improve and Ability to identify and develop effective coping behaviors will improve  I certify that inpatient services furnished can reasonably be expected to improve the patient's condition.    Malvin Johns, MD 10/20/20207:20 AM

## 2019-04-10 NOTE — Progress Notes (Signed)
Admission finished after pt was on the unit from earlier shift.

## 2019-04-11 DIAGNOSIS — F200 Paranoid schizophrenia: Secondary | ICD-10-CM | POA: Diagnosis not present

## 2019-04-11 LAB — TSH: TSH: 1.699 u[IU]/mL (ref 0.350–4.500)

## 2019-04-11 LAB — PREGNANCY, URINE: Preg Test, Ur: NEGATIVE

## 2019-04-11 LAB — HEMOGLOBIN A1C
Hgb A1c MFr Bld: 5.8 % — ABNORMAL HIGH (ref 4.8–5.6)
Mean Plasma Glucose: 119.76 mg/dL

## 2019-04-11 NOTE — Progress Notes (Signed)
Recreation Therapy Notes  INPATIENT RECREATION THERAPY ASSESSMENT  Patient Details Name: Lindsay [name] MRN: [id number] DOB: September 09, 1990 Today's Date: 04/11/2019       Information Obtained From: Patient  Able to Participate in Assessment/Interview: Yes  Patient Presentation: Alert  Reason for Admission (Per Patient): Other (Comments)(Pt stated she wasn't feeling right and not sleeping.)  Patient Stressors: Family, Death(Cousin-staying with her; Grandmother and 2 cousins passed last year)  Coping Skills:   Journal, Sports, TV, Exercise, Music, Meditate, Deep Breathing, Art, Talk, Prayer, Avoidance, Read, Hot Bath/Shower  Leisure Interests (2+):  Music - Singing, Social - Family, Social - Friends, Individual - Other (Comment)(Clean up)  Frequency of Recreation/Participation: Other (Comment)(Sing, Spend time with son- Daily; Clean up , Spend time with friends- Weekly)  Awareness of Community Resources:  Yes  Community Resources:  [place], Other (Comment)(Pool; Tennis court; Educational psychologist; Walking track; Aquarium)  Current Use: Yes  If no, Barriers?:    Expressed Interest in [employer] Information: No  County of Residence:  [place]  Patient Main Form of Transportation: Car  Patient Strengths:  Forgiveness; Love; God  Patient Identified Areas of Improvement:  Studying more; My environment  Patient Goal for Hospitalization:  "to have a moment to get closer to [facility], my mental health is stronger.  Be able to communicate with family and friends and enjoy what God has for [place] because I'm competent and know how to speak and remember things. I want to be there for those that are bound have freedom".  Current SI (including self-harm):  No  Current HI:  No  Current AVH: No  Staff Intervention Plan: Group Attendance, Collaborate with Interdisciplinary Treatment Team  Consent to Intern Participation: N/A    Victorino Sparrow,  LRT/CTRS   Victorino Sparrow A 04/11/2019, 12:34 PM

## 2019-04-11 NOTE — BHH Suicide Risk Assessment (Signed)
[facility] INPATIENT:  Family/Significant Other Suicide Prevention Education  Suicide Prevention Education:  Education Completed; Pt's mother, Lindsay [name], has been identified by the patient as the family member/significant other with whom the patient will be residing, and identified as the person(s) who will aid the patient in the event of a mental health crisis (suicidal ideations/suicide attempt).  With written consent from the patient, the family member/significant other has been provided the following suicide prevention education, prior to the and/or following the discharge of the patient.  The suicide prevention education provided includes the following:  Suicide risk factors  Suicide prevention and interventions  National Suicide Hotline telephone number  [facility] assessment telephone number  [facility] Emergency Assistance [place] and/or Residential Mobile Crisis Unit telephone number  Request made of family/significant other to:  Remove weapons (e.g., guns, rifles, knives), all items previously/currently identified as safety concern.    Remove drugs/medications (over-the-counter, prescriptions, illicit drugs), all items previously/currently identified as a safety concern.  The family member/significant other verbalizes understanding of the suicide prevention education information provided.  The family member/significant other agrees to remove the items of safety concern listed above.  CSW was contacted by pt's mother, Lindsay [name]. Pt gave verbal permission to speak with pt's mother until she can sign. Pt's mother asked about how the patient has been, her medications, and aftercare. Pt's mother stated that she does not want the patient to go back to [facility] and wants the patient to go to [facility] for medication management and therapy. Pt's mother asked for updates and to talk with the doctor.   Trecia Rogers 04/11/2019, 2:37 PM

## 2019-04-11 NOTE — Progress Notes (Signed)
[facility]  04/11/2019 12:57 PM Lindsay RichterWandra A [name]  MRN:  [id number] Subjective:    Patient is singing at the nurses station this morning while she is getting her medications and does not answer questions directly if she did comply for the most part she is still manic when asked her who was caring for her baby at home she states "Jesus Christ" remains hyper religious breaking into song intermittently and manic.  But denies wanting to harm self or others partially compliant thus far no EPS or TD denies thoughts of harming self Principal Problem: Postpartum exacerbation of psychosis Diagnosis: Active Problems:   Schizophrenia (HCC)  Total Time spent with patient: 20 minutes  Past Psychiatric History: As above  Past Medical History:  Past Medical History:  Diagnosis Date  . Anemia   . Asthma   . Homozygous alpha thalassemia (HCC)   . Hx of chlamydia infection   . Hx of scoliosis   . Pregnancy induced hypertension   . Schizophrenia [facility](HCC)     Past Surgical History:  Procedure Laterality Date  . CESAREAN SECTION N/A 01/25/2019   Procedure: CESAREAN SECTION;  Surgeon: Jaymes Graffillard, Naima, MD;  Location: [facility] LD ORS;  Service: Obstetrics;  Laterality: N/A;  . TONSILLECTOMY     Family History:  Family History  Problem Relation Age of Onset  . Cancer Father        liver  . Hypertension Mother   . Diabetes Maternal Grandmother   . Cancer Maternal Grandmother    Family Psychiatric  History: No new data Social History:  Social History   Substance and Sexual Activity  Alcohol Use No     Social History   Substance and Sexual Activity  Drug Use No    Social History   Socioeconomic History  . Marital status: Single    Spouse name: Not on file  . Number of children: Not on file  . Years of education: Not on file  . Highest education level: Not on file  Occupational History  . Not on file  Social Needs  . Financial resource strain: Not hard at all  . Food insecurity   Worry: Often true    Inability: Often true  . Transportation needs    Medical: No    Non-medical: Not on file  Tobacco Use  . Smoking status: Never Smoker  . Smokeless tobacco: Never Used  Substance and Sexual Activity  . Alcohol use: No  . Drug use: No  . Sexual activity: Yes    Birth control/protection: None  Lifestyle  . Physical activity    Days per week: Not on file    Minutes per session: Not on file  . Stress: Not at all  Relationships  . Social Musicianconnections    Talks on phone: Not on file    Gets together: Not on file    Attends religious service: Not on file    Active member of club or organization: Not on file    Attends meetings of clubs or organizations: Not on file    Relationship status: Not on file  Other Topics Concern  . Not on file  Social History Narrative  . Not on file   Additional Social History:                         Sleep: Poor  Appetite:  Good  Current Medications: Current Facility-Administered Medications  Medication Dose Route Frequency Provider Last Rate Last Dose  .  acetaminophen (TYLENOL) tablet 650 mg  650 mg Oral Q6H PRN Derrill Center, NP      . alum & mag hydroxide-simeth (MAALOX/MYLANTA) 200-200-20 MG/5ML suspension 30 mL  30 mL Oral Q4H PRN Derrill Center, NP      . benztropine (COGENTIN) tablet 0.5 mg  0.5 mg Oral BID Johnn Hai, MD   0.5 mg at 04/11/19 6387  . carbamazepine (TEGRETOL) chewable tablet 100 mg  100 mg Oral TID Johnn Hai, MD   100 mg at 04/11/19 1240  . hydrOXYzine (ATARAX/VISTARIL) tablet 25 mg  25 mg Oral TID PRN Derrill Center, NP      . LORazepam (ATIVAN) tablet 2 mg  2 mg Oral Q4H Johnn Hai, MD   2 mg at 04/11/19 1240   Or  . LORazepam (ATIVAN) injection 2 mg  2 mg Intramuscular Q4H Johnn Hai, MD      . magnesium hydroxide (MILK OF MAGNESIA) suspension 30 mL  30 mL Oral Daily PRN Derrill Center, NP      . risperiDONE (RISPERDAL) tablet 4 mg  4 mg Oral BID Johnn Hai, MD   4 mg at  04/11/19 5643  . temazepam (RESTORIL) capsule 30 mg  30 mg Oral QHS Johnn Hai, MD   30 mg at 04/10/19 2006  . traZODone (DESYREL) tablet 50 mg  50 mg Oral QHS PRN Derrill Center, NP      . ziprasidone (GEODON) injection 20 mg  20 mg Intramuscular Q6H PRN Johnn Hai, MD        Lab Results:  Results for orders placed or performed during the hospital encounter of 04/09/19 (from the past 48 hour(s))  Hemoglobin A1c     Status: Abnormal   Collection Time: 04/11/19  6:24 AM  Result Value Ref Range   Hgb A1c MFr Bld 5.8 (H) 4.8 - 5.6 %    Comment: (NOTE) Pre diabetes:          5.7%-6.4% Diabetes:              >6.4% Glycemic control for   <7.0% adults with diabetes    Mean Plasma Glucose 119.76 mg/dL    Comment: Performed at [facility], [place] [address] [place], [place] [address]  TSH     Status: None   Collection Time: 04/11/19  6:24 AM  Result Value Ref Range   TSH 1.699 0.350 - 4.500 uIU/mL    Comment: Performed by a 3rd Generation assay with a functional sensitivity of <=0.01 uIU/mL. Performed at [facility], [place] [address], [place], [place] [address]     Blood Alcohol level:  Lab Results  Component Value Date   ETH <10 04/08/2019   ETH <10 60/63/0160    Metabolic Disorder Labs: Lab Results  Component Value Date   HGBA1C 5.8 (H) 04/11/2019   MPG 119.76 04/11/2019   No results found for: PROLACTIN No results found for: CHOL, TRIG, HDL, CHOLHDL, VLDL, LDLCALC  Physical Findings: AIMS:  , ,  ,  ,    CIWA:    COWS:     Musculoskeletal: Strength & Muscle Tone: within normal limits Gait & Station: normal Patient leans: N/A  Psychiatric Specialty Exam: Physical Exam  ROS  Blood pressure 128/88, pulse (!) 114, temperature 98.3 F (36.8 C), temperature source Oral, resp. rate 20, height 5\' 3"  (1.6 m), weight 127.9 kg, last menstrual period 05/10/2018, SpO2 100 %.Body mass index is 49.95 kg/m.  General Appearance: Casual  Eye Contact:   Good  Speech:  Clear and Coherent  Volume:  Increased  Mood:  Euphoric  Affect:  Congruent  Thought Process:  Disorganized looseness of associations  Orientation:  Full (Time, Place, and Person)  Thought Content:  Illogical/delusional/hyper religious  Suicidal Thoughts:  No  Homicidal Thoughts:  No  Memory:  Immediate;   Poor Recent;   Poor Remote;   Fair  Judgement:  Impaired  Insight:  Lacking  Psychomotor Activity:  Normal  Concentration:  Concentration: Poor and Attention Span: Poor  Recall:  Poor  Fund of Knowledge:  Poor  Language:  Fair  Akathisia:  Negative  Handed:  Right  AIMS (if indicated):     Assets:  Leisure Time Physical Health  ADL's:  Intact  Cognition:  WNL  Sleep:  Number of Hours: 8     Treatment Plan Summary: Daily contact with patient to assess and evaluate symptoms and progress in treatment and Medication management  Continue antipsychotic therapy/mood stabilizer therapy reality based therapy no change in precautions continue to encourage compliance  Ryah Cribb, MD 04/11/2019, 12:57 PM

## 2019-04-11 NOTE — Progress Notes (Signed)
Psychoeducational Group Note  Date:  04/11/2019 Time:  2033  Group Topic/Focus:  Wrap-Up Group:   The focus of this group is to help patients review their daily goal of treatment and discuss progress on daily workbooks.  Participation Level: Did Not Attend  Participation Quality:  Not Applicable  Affect:  Not Applicable  Cognitive:  Not Applicable  Insight:  Not Applicable  Engagement in Group: Not Applicable  Additional Comments: The patient was asleep in her room and therefore did not attend group.   Archie Balboa S 04/11/2019, 8:33 PM

## 2019-04-11 NOTE — Tx Team (Signed)
Interdisciplinary Treatment and Diagnostic Plan Update  04/11/2019 Time of Session: 1:05pm Lindsay [name] MRN: [id number]  Principal Diagnosis: <principal problem not specified>  Secondary Diagnoses: Active Problems:   Schizophrenia (HCC)   Current Medications:  Current Facility-Administered Medications  Medication Dose Route Frequency Provider Last Rate Last Dose  . acetaminophen (TYLENOL) tablet 650 mg  650 mg Oral Q6H PRN Oneta Rack, NP      . alum & mag hydroxide-simeth (MAALOX/MYLANTA) 200-200-20 MG/5ML suspension 30 mL  30 mL Oral Q4H PRN Oneta Rack, NP      . benztropine (COGENTIN) tablet 0.5 mg  0.5 mg Oral BID Malvin Johns, MD   0.5 mg at 04/11/19 3818  . carbamazepine (TEGRETOL) chewable tablet 100 mg  100 mg Oral TID Malvin Johns, MD   100 mg at 04/11/19 1240  . hydrOXYzine (ATARAX/VISTARIL) tablet 25 mg  25 mg Oral TID PRN Oneta Rack, NP      . LORazepam (ATIVAN) tablet 2 mg  2 mg Oral Q4H Malvin Johns, MD   2 mg at 04/11/19 1240   Or  . LORazepam (ATIVAN) injection 2 mg  2 mg Intramuscular Q4H Malvin Johns, MD      . magnesium hydroxide (MILK OF MAGNESIA) suspension 30 mL  30 mL Oral Daily PRN Oneta Rack, NP      . risperiDONE (RISPERDAL) tablet 4 mg  4 mg Oral BID Malvin Johns, MD   4 mg at 04/11/19 2993  . temazepam (RESTORIL) capsule 30 mg  30 mg Oral QHS Malvin Johns, MD   30 mg at 04/10/19 2006  . traZODone (DESYREL) tablet 50 mg  50 mg Oral QHS PRN Oneta Rack, NP      . ziprasidone (GEODON) injection 20 mg  20 mg Intramuscular Q6H PRN Malvin Johns, MD       PTA Medications: Medications Prior to Admission  Medication Sig Dispense Refill Last Dose  . HYDROcodone-acetaminophen (NORCO/VICODIN) 5-325 MG tablet Take 1-2 tablets by mouth every 4 (four) hours as needed for moderate pain. (Patient not taking: Reported on 04/09/2019) 20 tablet 0   . ibuprofen (ADVIL) 600 MG tablet Take 1 tablet (600 mg total) by mouth every 6 (six) hours as needed  for moderate pain. (Patient not taking: Reported on 04/09/2019) 30 tablet 0   . labetalol (NORMODYNE) 200 MG tablet Take 1 tablet (200 mg total) by mouth 2 (two) times daily. (Patient not taking: Reported on 04/09/2019) 60 tablet 0     Patient Stressors: Financial difficulties Medication change or noncompliance  Patient Strengths: General fund of knowledge Supportive family/friends  Treatment Modalities: Medication Management, Group therapy, Case management,  1 to 1 session with clinician, Psychoeducation, Recreational therapy.   Physician Treatment Plan for Primary Diagnosis: <principal problem not specified> Long Term Goal(s): Improvement in symptoms so as ready for discharge Improvement in symptoms so as ready for discharge   Short Term Goals: Ability to disclose and discuss suicidal ideas Ability to identify and develop effective coping behaviors will improve Ability to maintain clinical measurements within normal limits will improve Compliance with prescribed medications will improve Ability to identify triggers associated with substance abuse/mental health issues will improve Ability to disclose and discuss suicidal ideas Ability to demonstrate self-control will improve Ability to identify and develop effective coping behaviors will improve  Medication Management: Evaluate patient's response, side effects, and tolerance of medication regimen.  Therapeutic Interventions: 1 to 1 sessions, Unit Group sessions and Medication administration.  Evaluation of Outcomes:  Progressing  Physician Treatment Plan for Secondary Diagnosis: Active Problems:   Schizophrenia ([place])  Long Term Goal(s): Improvement in symptoms so as ready for discharge Improvement in symptoms so as ready for discharge   Short Term Goals: Ability to disclose and discuss suicidal ideas Ability to identify and develop effective coping behaviors will improve Ability to maintain clinical measurements within normal  limits will improve Compliance with prescribed medications will improve Ability to identify triggers associated with substance abuse/mental health issues will improve Ability to disclose and discuss suicidal ideas Ability to demonstrate self-control will improve Ability to identify and develop effective coping behaviors will improve     Medication Management: Evaluate patient's response, side effects, and tolerance of medication regimen.  Therapeutic Interventions: 1 to 1 sessions, Unit Group sessions and Medication administration.  Evaluation of Outcomes: Progressing   RN Treatment Plan for Primary Diagnosis: <principal problem not specified> Long Term Goal(s): Knowledge of disease and therapeutic regimen to maintain health will improve  Short Term Goals: Ability to participate in decision making will improve, Ability to verbalize feelings will improve, Ability to disclose and discuss suicidal ideas, Ability to identify and develop effective coping behaviors will improve and Compliance with prescribed medications will improve  Medication Management: RN will administer medications as ordered by provider, will assess and evaluate patient's response and provide education to patient for prescribed medication. RN will report any adverse and/or side effects to prescribing provider.  Therapeutic Interventions: 1 on 1 counseling sessions, Psychoeducation, Medication administration, Evaluate responses to treatment, Monitor vital signs and CBGs as ordered, Perform/monitor CIWA, COWS, AIMS and Fall Risk screenings as ordered, Perform wound care treatments as ordered.  Evaluation of Outcomes: Progressing   LCSW Treatment Plan for Primary Diagnosis: <principal problem not specified> Long Term Goal(s): Safe transition to appropriate next level of care at discharge, Engage patient in therapeutic group addressing interpersonal concerns.  Short Term Goals: Engage patient in aftercare planning with  referrals and resources and Increase skills for wellness and recovery  Therapeutic Interventions: Assess for all discharge needs, 1 to 1 time with Social worker, Explore available resources and support systems, Assess for adequacy in community support network, Educate family and significant other(s) on suicide prevention, Complete Psychosocial Assessment, Interpersonal group therapy.  Evaluation of Outcomes: Progressing   Progress in Treatment: Attending groups: Yes. Participating in groups: Yes. Taking medication as prescribed: No. Toleration medication: No. Family/Significant other contact made: No, will contact:  will contact if given consent to contact Patient understands diagnosis: No. Discussing patient identified problems/goals with staff: Yes. Medical problems stabilized or resolved: Yes. Denies suicidal/homicidal ideation: Yes. Issues/concerns per patient self-inventory: No. Other:   New problem(s) identified: No, Describe:  None  New Short Term/Long Term Goal(s): Medication stabilization, elimination of SI thoughts, and development of a comprehensive mental wellness plan.   Patient Goals:  "make sure I don't have medications as a remedy for trauma"  Discharge Plan or Barriers: CSW will continue to follow up for appropriate referrals and possible discharge planning  Reason for Continuation of Hospitalization: Delusions  [place]ia Medication stabilization  Estimated Length of Stay: 3-5 days   Attendees: Patient: Lindsay [name] 04/11/2019   Physician: Dr. Johnn Hai, MD 04/11/2019   Nursing: Vladimir Faster, RN 04/11/2019   RN Care Manager: 04/11/2019   Social Worker: Ardelle Anton, LCSW  04/11/2019   Recreational Therapist:  04/11/2019   MSW Intern: Ovidio Kin 04/11/2019   Other:  04/11/2019   Other: 04/11/2019      Scribe for Treatment Team:  Delphia GratesJasmine M Yaremi Stahlman, LCSW 04/11/2019 1:28 PM

## 2019-04-11 NOTE — BHH Counselor (Addendum)
CSW met with patient with her assessment. CSW asked if she could do the assessment. Patient agreed but speech was slow. CSW asked if right now was a bad time and if the patient was sleepy. Patient agreed to being sleepy and asked to assessment on another day.  CSW asked permission to speak with pt's mother who has been calling. Patient gave verbal permission to speak with pt's mother.   This is the patient's first attempt for assessment.

## 2019-04-11 NOTE — Progress Notes (Signed)
Recreation Therapy Notes  Date: 10.21.20 Time: 1000 Location: 500 Hall Dayroom   Group Topic: Communication, Team Building, Problem Solving  Goal Area(s) Addresses:  Patient will effectively work with peer towards shared goal.  Patient will identify skills used to make activity successful.  Patient will identify how skills used during activity can be used to reach post d/c goals.   Behavioral Response: Engaged  Intervention: STEM Activity  Activity:  Cup [employer].  In groups of 4, patients were given 10 red cups and rubber band with four strings attached to it.  Patients were to use the rubber band contraption to sit the cups upright and then stack them like a pyramid.  Education: Education officer, community, Discharge Planning   Education Outcome: Acknowledges education/In group clarification offered/Needs additional education.   Clinical Observations/Feedback:  Pt was attentive and active during activity.  Pt did a great job of getting involving peers in how to complete activity.  Pt also did a good job of trying to implement the suggestions of her peers.  Pt stated the group struggled with communication because "everyone was trying to lead".  Pt also suggested that group members express what they see during the course of the activity because they may see something that everyone else isn't seeing.     Victorino Sparrow, LRT/CTRS    Victorino Sparrow A 04/11/2019 12:06 PM

## 2019-04-11 NOTE — Progress Notes (Signed)
Recreation Therapy Notes  10.21.20 [phone]:  Pt asked to speak with LRT.  LRT and pt spoke with pt.  Pt began telling pt about she ended up here in the hospital. Pt expressed the reason she is here goes back to the reason she was here last time.  Pt started talking about her ex-boyfriend and that he wasn't who he portrayed himself to be.  Pt expressed he moved to [place] and wanted her to go with him.  Pt decided to stay here instead because she liked her job.  Pt then talked about her cousin moving in with her but that didn't work out and things started to change at her job, so she moved in with her god sister in [place] because she didn't feel safe.  While there, pt stated she got another job.  Eventually that didn't work, so she moved on her own and changed jobs again.  Pt continued to express she wasn't feeling right and strange things were happening in her apartment, so she started staying in her car.  At some point, pt moved back here and was taking care of her grandmother.  Pt stated her grandmother and two of her cousins all passed away last year.  Pt was tearful at various points during the conversation and expressed wanting to see her family members she hasn't seen in a while.   Victorino Sparrow, LRT/CTRS   Victorino Sparrow A 04/11/2019 12:14 PM

## 2019-04-12 LAB — PROLACTIN: Prolactin: 219 ng/mL — ABNORMAL HIGH (ref 4.8–23.3)

## 2019-04-12 MED ORDER — LORAZEPAM 1 MG PO TABS: 2.0000 mg | ORAL_TABLET | ORAL | Status: DC | PRN

## 2019-04-12 MED ORDER — CARBAMAZEPINE 200 MG PO TABS
200.0000 mg | ORAL_TABLET | Freq: Three times a day (TID) | ORAL | Status: DC
  Administered 2019-04-12 – 2019-04-13 (×4): 200 mg via ORAL
  Filled 2019-04-12 (×11): qty 1

## 2019-04-12 MED ORDER — BENZTROPINE MESYLATE 1 MG PO TABS
1.0000 mg | ORAL_TABLET | Freq: Two times a day (BID) | ORAL | Status: DC
  Administered 2019-04-12 – 2019-04-13 (×2): 1 mg via ORAL
  Filled 2019-04-12 (×7): qty 1

## 2019-04-12 MED ORDER — RISPERIDONE 2 MG PO TABS
4.0000 mg | ORAL_TABLET | Freq: Three times a day (TID) | ORAL | Status: DC
  Administered 2019-04-12 – 2019-04-13 (×4): 4 mg via ORAL
  Filled 2019-04-12 (×10): qty 2

## 2019-04-12 MED ORDER — LORAZEPAM 2 MG/ML IJ SOLN: 2.0000 mg | INTRAMUSCULAR | Status: DC | PRN

## 2019-04-12 NOTE — BHH Counselor (Signed)
Adult Comprehensive Assessment  Patient ID: Lindsay [name], female   DOB: 1990/08/25, 28 y.o.   MRN: [id number]  Information Source: Information source: Patient  Current Stressors:  Patient states their primary concerns and needs for treatment are:: "I remembered things that were done to me." Patient describes acting bizarrely, going to the ED, and making statements to ED staff to kill her like Lindsay [name] or Lindsay [name]. 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 The job is alright, but she reports her work environment is disorganized and there has been some disruption due to COVID 19. Family Relationships: Good supports, very close to mom. Mom is keeping her 50 month old baby right now. Financial / Lack of resources (include bankruptcy): Limited income, has Media planner. Housing / Lack of housing: Living with her boyfriend/father of her child. Things are strained and she reports she might move in with her mother at discharge. Social: Strained relationship with her boyfriend/father of her child. They have been together for about 1 year and have a new baby.  Living/Environment/Situation: Living Arrangements: Significant other Living conditions (as described by patient or guardian): Apartment in [place] How long has patient lived in current situation?: A couple of months What is atmosphere in current home: Comfortable, stressful at times  Family History: Marital Status: In a relationship - 1 year Are you sexually active?: Yes What is your sexual orientation?: heterosexual Does patient have children?: Yes, 80 month old son  Childhood History: Additional childhood history information: states she was raised by multiple family members Description of patient's relationship with caregiver when they were a child:  good Patient's description of current relationship with people who raised him/her: good Does patient have siblings?: Yes Description of patient's current relationship with siblings: "I have alot" Did patient suffer any verbal/emotional/physical/sexual abuse as a child?: No Did patient suffer from severe childhood neglect?: No Has patient ever been sexually abused/assaulted/raped as an adolescent or adult?: No Was the patient ever a victim of a crime or a disaster?: No Witnessed domestic violence?: No Has patient been effected by domestic violence as an adult?: No  Education: Highest grade of school patient has completed: 16-Social Work Currently a Consulting civil engineer?: No Learning disability?: No  Employment/Work Situation: Employment situation: Employed, but worried she may not have her job at discharge Patient's job has been impacted by current illness: No What is the longest time patient has a held a job?: several years Where was the patient employed at that time?: customer service Has patient ever been in the [employer]?: No Are There Guns or Other Weapons in Your Home?: No  Financial Resources: Financial resources: No income Does patient have a Lawyer or guardian?: No  Alcohol/Substance Abuse: Alcohol/Substance Abuse Treatment Hx: No substance use treatment history. Prior [facility] hospitalization in 2019 for paranoid thoughts and bizarre behavior. Has alcohol/substance abuse ever caused legal problems?: No  Social Support System: Patient's Community Support System: Good Describe Community Support System: Family Type of faith/religion: Ephriam Knuckles How does patient's faith help to cope with current illness?: Engineer, building services: Leisure and Hobbies: Singing  Strengths/Needs: What things does the patient do well?: Singing  Discharge Plan: Does patient have access to transportation?: Yes Will patient be returning to same living situation  after discharge?: Unsure. She states she is considering moving back in with her mother at discharge.  Both patient's boyfriend and mother live in [place]. Currently receiving community mental health services: Yes, established with [facility]. Wishes to continue with this provider.  Does patient have financial barriers related to discharge medications?: No Patient description of barriers related to discharge medications: Has private insurance and financial support.  Summary/Recommendations:   Summary and Recommendations (to be completed by the evaluator): Lindsay [name] is 28 year old female from [facility] ([place]). She present under IVC petitioned by EDP with paranoia and disorganization. Patient was hospitalized at [facility] once before in 2019 with similar presentation. This patient will benefit from [facility] services. While here, she can benefit from crises stabilization, medication management, therapeutic milieu and referral for services.  Lindsay [name]. 04/12/2019

## 2019-04-12 NOTE — Progress Notes (Signed)
D: Pt denies SI/HI/AVH. Pt is pleasant and cooperative. Pt stated she was trying to get adjusted to the medications. Pt visible on unit some, pt appeared more pleasant and calmer . Pt appeared very paranoid this evening A: Pt was offered support and encouragement. Pt was given scheduled medications. Pt was encourage to attend groups. Q 15 minute checks were done for safety.  R: safety maintained on unit.

## 2019-04-12 NOTE — Progress Notes (Signed)
The patient shared with the group that she was able to get some rest today. No additional details about her day were provided. Her goal for tomorrow is to find out more about her diagnosis. She would also like to work on herself more.

## 2019-04-12 NOTE — Progress Notes (Signed)
[facility]  04/12/2019 8:17 AM Lindsay RichterWandra A [name]  MRN:  [id number] Subjective:   Patient was seen in normal rounds yesterday but then in treatment team, after the treatment team session she refused to leave the room only under threat of security did she eventually get up and leave she also asked for the reason that she is "bipolar" and she was given the diagnostic criteria for a manic episode, printed out from the computer based on DSM-V  This morning she states she has not read the information she requested she continues to sing at intervals and present in a manic fashion but she is more behaviorally contained more redirectable thus far this morning there has been medication compliance.  No EPS or TD.  Principal Problem: Exacerbation of psychotic disorder in the context of a postpartum phase Diagnosis: Active Problems:   Schizophrenia (HCC)  Total Time spent with patient: 20 minutes  Past Psychiatric History: History of being diagnosed with schizophrenia but due to the manic nature of her presentation it is possible it is rather a schizoaffective type condition at any rate the treatment would be the same  Past Medical History:  Past Medical History:  Diagnosis Date  . Anemia   . Asthma   . Homozygous alpha thalassemia (HCC)   . Hx of chlamydia infection   . Hx of scoliosis   . Pregnancy induced hypertension   . Schizophrenia [facility](HCC)     Past Surgical History:  Procedure Laterality Date  . CESAREAN SECTION N/A 01/25/2019   Procedure: CESAREAN SECTION;  Surgeon: Jaymes Graffillard, Naima, MD;  Location: [facility] LD ORS;  Service: Obstetrics;  Laterality: N/A;  . TONSILLECTOMY     Family History:  Family History  Problem Relation Age of Onset  . Cancer Father        liver  . Hypertension Mother   . Diabetes Maternal Grandmother   . Cancer Maternal Grandmother    Family Psychiatric  History: no new Social History:  Social History   Substance and Sexual Activity  Alcohol Use No      Social History   Substance and Sexual Activity  Drug Use No    Social History   Socioeconomic History  . Marital status: Single    Spouse name: Not on file  . Number of children: Not on file  . Years of education: Not on file  . Highest education level: Not on file  Occupational History  . Not on file  Social Needs  . Financial resource strain: Not hard at all  . Food insecurity    Worry: Often true    Inability: Often true  . Transportation needs    Medical: No    Non-medical: Not on file  Tobacco Use  . Smoking status: Never Smoker  . Smokeless tobacco: Never Used  Substance and Sexual Activity  . Alcohol use: No  . Drug use: No  . Sexual activity: Yes    Birth control/protection: None  Lifestyle  . Physical activity    Days per week: Not on file    Minutes per session: Not on file  . Stress: Not at all  Relationships  . Social Musicianconnections    Talks on phone: Not on file    Gets together: Not on file    Attends religious service: Not on file    Active member of club or organization: Not on file    Attends meetings of clubs or organizations: Not on file    Relationship status:  Not on file  Other Topics Concern  . Not on file  Social History Narrative  . Not on file   Additional Social History:                         Sleep: Fair  Appetite:  Fair  Current Medications: Current Facility-Administered Medications  Medication Dose Route Frequency Provider Last Rate Last Dose  . acetaminophen (TYLENOL) tablet 650 mg  650 mg Oral Q6H PRN Oneta Rack, NP      . alum & mag hydroxide-simeth (MAALOX/MYLANTA) 200-200-20 MG/5ML suspension 30 mL  30 mL Oral Q4H PRN Oneta Rack, NP      . benztropine (COGENTIN) tablet 1 mg  1 mg Oral BID Malvin Johns, MD      . carbamazepine (TEGRETOL) chewable tablet 200 mg  200 mg Oral TID Malvin Johns, MD      . hydrOXYzine (ATARAX/VISTARIL) tablet 25 mg  25 mg Oral TID PRN Oneta Rack, NP      . LORazepam  (ATIVAN) tablet 2 mg  2 mg Oral Q4H PRN Malvin Johns, MD       Or  . LORazepam (ATIVAN) injection 2 mg  2 mg Intramuscular Q4H PRN Malvin Johns, MD      . magnesium hydroxide (MILK OF MAGNESIA) suspension 30 mL  30 mL Oral Daily PRN Oneta Rack, NP      . risperiDONE (RISPERDAL) tablet 4 mg  4 mg Oral TID Malvin Johns, MD      . temazepam (RESTORIL) capsule 30 mg  30 mg Oral QHS Malvin Johns, MD   30 mg at 04/10/19 2006  . traZODone (DESYREL) tablet 50 mg  50 mg Oral QHS PRN Oneta Rack, NP      . ziprasidone (GEODON) injection 20 mg  20 mg Intramuscular Q6H PRN Malvin Johns, MD        Lab Results:  Results for orders placed or performed during the hospital encounter of 04/09/19 (from the past 48 hour(s))  Hemoglobin A1c     Status: Abnormal   Collection Time: 04/11/19  6:24 AM  Result Value Ref Range   Hgb A1c MFr Bld 5.8 (H) 4.8 - 5.6 %    Comment: (NOTE) Pre diabetes:          5.7%-6.4% Diabetes:              >6.4% Glycemic control for   <7.0% adults with diabetes    Mean Plasma Glucose 119.76 mg/dL    Comment: Performed at [facility], [address] [place], [place] [address]  Prolactin     Status: Abnormal   Collection Time: 04/11/19  6:24 AM  Result Value Ref Range   Prolactin 219.0 (H) 4.8 - 23.3 ng/mL    Comment: (NOTE) Performed At: [facility] [address] [place], [place] [id number] Jolene Schimke MD KW:[id number]   TSH     Status: None   Collection Time: 04/11/19  6:24 AM  Result Value Ref Range   TSH 1.699 0.350 - 4.500 uIU/mL    Comment: Performed by a 3rd Generation assay with a functional sensitivity of <=0.01 uIU/mL. Performed at [facility], [address], [place], [place] [address]     Blood Alcohol level:  Lab Results  Component Value Date   [facility] <10 04/08/2019   ETH <10 10/18/2017    Metabolic Disorder Labs: Lab Results  Component Value Date  HGBA1C 5.8 (H) 04/11/2019   MPG 119.76 04/11/2019    Lab Results  Component Value Date   PROLACTIN 219.0 (H) 04/11/2019   No results found for: CHOL, TRIG, HDL, CHOLHDL, VLDL, LDLCALC   Musculoskeletal: Strength & Muscle Tone: within normal limits Gait & Station: normal Patient leans: N/A  Psychiatric Specialty Exam: Physical Exam  ROS  Blood pressure 131/73, pulse (!) 119, temperature 99 F (37.2 C), temperature source Oral, resp. rate 20, height 5\' 3"  (1.6 m), weight 127.9 kg, last menstrual period 05/10/2018, SpO2 100 %.Body mass index is 49.95 kg/m.  General Appearance: Bizarre  Eye Contact:  Good  Speech:  Pressured  Volume:  Increased  Mood: Hypomanic  Affect:  Congruent  Thought Process:  Irrelevant and Descriptions of Associations: Loose  Orientation:  Other:  Person place situation not exact day or date  Thought Content:  Illogical and Tangential  Suicidal Thoughts:  No  Homicidal Thoughts:  No  Memory:  Immediate;   Poor Recent;   Poor Remote;   Fair  Judgement:  Impaired  Insight:  Lacking  Psychomotor Activity:  Normal  Concentration:  Concentration: Poor and Attention Span: Poor  Recall:  Poor  Fund of Knowledge:  Poor  Language:  Fair  Akathisia:  Negative  Handed:  Right  AIMS (if indicated):     Assets:  Physical Health Resilience Social Support  ADL's:  Intact  Cognition:  WNL  Sleep:  Number of Hours: 8.25     Treatment Plan Summary: Daily contact with patient to assess and evaluate symptoms and progress in treatment and Medication management  Escalate Risperdal escalate Cogentin and Tegretol continue reality based therapy again more contained more redirectable no change in precautions  Lindsay Colaizzi, MD 04/12/2019, 8:17 AM

## 2019-04-13 MED ORDER — RISPERIDONE 4 MG PO TABS: ORAL_TABLET | ORAL | 2 refills | Status: DC

## 2019-04-13 MED ORDER — TEMAZEPAM 30 MG PO CAPS: 30.0000 mg | ORAL_CAPSULE | Freq: Every day | ORAL | 0 refills | Status: DC

## 2019-04-13 MED ORDER — BENZTROPINE MESYLATE 1 MG PO TABS: 1.0000 mg | ORAL_TABLET | Freq: Two times a day (BID) | ORAL | 2 refills | Status: DC

## 2019-04-13 MED ORDER — CARBAMAZEPINE 200 MG PO TABS: ORAL_TABLET | ORAL | 2 refills | Status: DC

## 2019-04-13 NOTE — Plan of Care (Signed)
Pt was able to exhibit positive coping skills to decrease depressive symptoms.    Victorino Sparrow, LRT/CTRS

## 2019-04-13 NOTE — Progress Notes (Signed)
Recreation Therapy Notes  INPATIENT RECREATION TR PLAN  Patient Details Name: Lindsay [name] MRN: [id number] DOB: 1990-08-28 Today's Date: 04/13/2019  Rec Therapy Plan Is patient appropriate for Therapeutic Recreation?: Yes Treatment times per week: about 3 days Estimated Length of Stay: 5-7 days TR Treatment/Interventions: Group participation (Comment)  Discharge Criteria Pt will be discharged from therapy if:: Discharged Treatment plan/goals/alternatives discussed and agreed upon by:: Patient/family  Discharge Summary Short term goals set: See patient care plan Short term goals met: Adequate for discharge Progress toward goals comments: Groups attended Which groups?: Communication, Other (Comment)(Team building) Reason goals not met: None Therapeutic equipment acquired: N/A Reason patient discharged from therapy: Discharge from hospital Pt/family agrees with progress & goals achieved: Yes Date patient discharged from therapy: 04/13/19    Victorino Sparrow, LRT/CTRS  Ria Comment, Angela Vazguez A 04/13/2019, 11:30 AM

## 2019-04-13 NOTE — BHH Suicide Risk Assessment (Signed)
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 rate 20, height 5\' 3"  (1.6 m), weight 127.9 kg, last menstrual period 05/10/2018, SpO2 100 %.Body mass index is 49.95 kg/m.  General Appearance: Casual  Eye Contact:  Good  Speech:  Normal Rate  Volume:  Normal  Mood:  Euthymic  Affect:  Congruent  Thought Process:  Coherent, Linear and Descriptions of Associations: Circumstantial  Orientation:  Full (Time, Place, and Person)  Thought Content:  Logical  Suicidal Thoughts:  No  Homicidal Thoughts:  No  Memory:  Immediate;   Fair Recent;   Fair Remote;   Fair  Judgement:  Fair  Insight:  Fair  Psychomotor Activity:  Normal  Concentration:  Concentration: Fair and Attention Span: Fair  Recall:  [employer] of Knowledge:  Fair  Language:  Fair  Akathisia:  Negative  Handed:  Right  AIMS (if indicated):     Assets:  Physical Health Resilience  ADL's:  Intact  Cognition:  WNL  Sleep:  Number of Hours: 8.5  Patient does not have recall of her most psychotic statements and this is normal she does not have thoughts of harming her infant or herself or anyone else and understands what it is to contract fully for safety.  No obsessive-compulsive symptoms  Mental Status Per Nursing Assessment::   On Admission:  NA  Demographic Factors:  n/a  Loss Factors: NA  Historical Factors: NA  Risk Reduction Factors:   Positive social support  Continued Clinical Symptoms:  Postpartum Depression  Cognitive Features That Contribute To Risk:  None    Suicide  Risk:  Minimal: No identifiable suicidal ideation.  Patients presenting with no risk factors but with morbid ruminations; may be classified as minimal risk based on the severity of the depressive symptoms  Follow-up [place] Follow up on 04/25/2019.   Why: Please attend your intake appointment for therapy and medication management services on Wednesday, 11/4 at 3:30p.  Be sure to bring your photo ID, insurance card, and current medications.  Contact information: [address] [place] [address] [phone]           Plan Of Care/Follow-up recommendations:  Activity:  full  Yosgart Pavey, MD 04/13/2019, 10:42 AM

## 2019-04-13 NOTE — Discharge Summary (Signed)
Physician Discharge Summary Note  Patient:  Lindsay [name] is an 28 y.o., female MRN:  [id number] DOB:  06/27/1990 Patient phone:  [phone] (home)  Patient address:   [address] [address],  Total Time spent with patient: 45 minutes  Date of Admission:  04/09/2019 Date of Discharge: 04/13/2019  Reason for Admission:      This is the second psychiatric admission at our facility for Ms. Witman, she is 28 years of age, has been diagnosed with a schizophrenic condition and she has required petition for involuntary commitment.  Symptoms include disorganized thought and behavior, mania, hyperreligiosity, bizarre and delusional statements, and she recently gave birth to a son on 01/26/2019 She presents with a negative drug screen.  She is uncooperative with my interview process as discussed below due to her psychosis. She is making bizarre and hyper religious statements during the mental status exam  Past history involves her first admission here in May 2019, she was diagnosed with her first break of schizophrenia, was ordered Abilify long-acting injectable due to refusal of medications but was also discharged with the long-acting, 400 mg monthly / order- but did not take. "was stable on" oral-  after a period of time, was noted to be stable and meds tapered and stopped over 2019-  Those she had presented in 2019, her symptoms really began by around age 28, and significantly worsened by the fall 2018-her initial presentation was 1 of paranoia that forced her to live outside of an apartment in her car that progressed to more agitation, disorganized thought and behavior, and then a presentation more like the one we see on this admission.  On mental status exam she is alert refuses to tell me her name, makes nonsensical statements, for example when asking her if she knows the day or the date she states "I am going to sleep now" while she is alert and talking, she walks off  from the interview, she began singing hymns, and again makes unusual statements that are disjointed.  She is at times giddy at other times disrespectful, and again refusing to cooperate and stating "I do not take medicines" and "I will drink when the spirit tells me"  Principal Problem: Postpartum relapse of manic type symptoms/psychosis Discharge Diagnoses: Active Problems:   Schizophrenia [facility])   Past Psychiatric History: Schizophrenia versus schizoaffective disorder bipolar type  Past Medical History:  Past Medical History:  Diagnosis Date  . Anemia   . Asthma   . Homozygous alpha thalassemia ([place])   . Hx of chlamydia infection   . Hx of scoliosis   . Pregnancy induced hypertension   . Schizophrenia [facility])     Past Surgical History:  Procedure Laterality Date  . CESAREAN SECTION N/A 01/25/2019   Procedure: CESAREAN SECTION;  Surgeon: Crawford Givens, MD;  Location: [facility] LD ORS;  Service: Obstetrics;  Laterality: N/A;  . TONSILLECTOMY     Family History:  Family History  Problem Relation Age of Onset  . Cancer Father        liver  . Hypertension Mother   . Diabetes Maternal Grandmother   . Cancer Maternal Grandmother    Family Psychiatric  History: neg Social History:  Social History   Substance and Sexual Activity  Alcohol Use No     Social History   Substance and Sexual Activity  Drug Use No    Social History   Socioeconomic History  . Marital status: Single    Spouse name:  Not on file  . Number of children: Not on file  . Years of education: Not on file  . Highest education level: Not on file  Occupational History  . Not on file  Social Needs  . Financial resource strain: Not hard at all  . Food insecurity    Worry: Often true    Inability: Often true  . Transportation needs    Medical: No    Non-medical: Not on file  Tobacco Use  . Smoking status: Never Smoker  . Smokeless tobacco: Never Used  Substance and Sexual Activity  . Alcohol use: No  .  Drug use: No  . Sexual activity: Yes    Birth control/protection: None  Lifestyle  . Physical activity    Days per week: Not on file    Minutes per session: Not on file  . Stress: Not at all  Relationships  . Social Musicianconnections    Talks on phone: Not on file    Gets together: Not on file    Attends religious service: Not on file    Active member of club or organization: Not on file    Attends meetings of clubs or organizations: Not on file    Relationship status: Not on file  Other Topics Concern  . Not on file  Social History Narrative  . Not on file    Hospital Course:    As discussed patient's psychosis made her difficult to manage initially and she had some oppositional behaviors actually refusing to leave the treatment team meeting for period of time giving nonsensical answers, singing spontaneously and loudly in the hallway so forth.  However she did comply fully with medications and improved dramatically by the date of the 23rd.  By the 23rd she had slept well she was noted to be alert fully oriented and cooperative.  Her mania had been resolved and she was in a euthymic state cooperative coherent and her thought is goal-directed in her thoughts and had no auditory or visual hallucinations, no thoughts of harming self or others.  Since it was a dramatic turnaround we did phone her mother a couple of times on the 23rd to make sure that her understanding of the patient's baseline was also our understanding and the mother was comfortable taking her home and she could watch her through the weekend.  No EPS or TD no thoughts of harming self or others.  One warning is that her medications are of a high dosing but this seems to be what she requires for stability.  Musculoskeletal: Strength & Muscle Tone: within normal limits Gait & Station: normal Patient leans: N/A  Psychiatric Specialty Exam: Physical Exam  ROS  Blood pressure 115/65, pulse (!) 116, temperature 98.7 F (37.1 C),  temperature source Oral, resp. rate 20, height 5\' 3"  (1.6 m), weight 127.9 kg, last menstrual period 05/10/2018, SpO2 100 %.Body mass index is 49.95 kg/m.  General Appearance: Casual  Eye Contact:  Good  Speech:  Normal Rate  Volume:  Normal  Mood:  Euthymic  Affect:  Congruent  Thought Process:  Coherent, Linear and Descriptions of Associations: Circumstantial  Orientation:  Full (Time, Place, and Person)  Thought Content:  Logical  Suicidal Thoughts:  No  Homicidal Thoughts:  No  Memory:  Immediate;   Fair Recent;   Fair Remote;   Fair  Judgement:  Fair  Insight:  Fair  Psychomotor Activity:  Normal  Concentration:  Concentration: Fair and Attention Span: Fair  Recall:  Fair  Fund of Knowledge:  Fair  Language:  Fair  Akathisia:  Negative  Handed:  Right  AIMS (if indicated):     Assets:  Physical Health Resilience  ADL's:  Intact  Cognition:  WNL  Sleep:  Number of Hours: 8.5  Patient does not have recall of her most psychotic statements and this is normal she does not have thoughts of harming her infant or herself or anyone else and understands what it is to contract fully for safety.  No obsessive-compulsive symptoms   Have you used any form of tobacco in the last 30 days? (Cigarettes, Smokeless Tobacco, Cigars, and/or Pipes): No  Has this patient used any form of tobacco in the last 30 days? (Cigarettes, Smokeless Tobacco, Cigars, and/or Pipes) Yes, No  Blood Alcohol level:  Lab Results  Component Value Date   ETH <10 04/08/2019   ETH <10 10/18/2017    Metabolic Disorder Labs:  Lab Results  Component Value Date   HGBA1C 5.8 (H) 04/11/2019   MPG 119.76 04/11/2019   Lab Results  Component Value Date   PROLACTIN 219.0 (H) 04/11/2019   No results found for: CHOL, TRIG, HDL, CHOLHDL, VLDL, LDLCALC  See Psychiatric Specialty Exam and Suicide Risk Assessment completed by Attending Physician prior to discharge.  Discharge destination:  Home  Is patient on  multiple antipsychotic therapies at discharge:  No   Has Patient had three or more failed trials of antipsychotic monotherapy by history:  No  Recommended Plan for Multiple Antipsychotic Therapies: NA   Allergies as of 04/13/2019   No Known Allergies     Medication List    STOP taking these medications   HYDROcodone-acetaminophen 5-325 MG tablet Commonly known as: NORCO/VICODIN   labetalol 200 MG tablet Commonly known as: NORMODYNE     TAKE these medications     Indication  benztropine 1 MG tablet Commonly known as: COGENTIN Take 1 tablet (1 mg total) by mouth 2 (two) times daily.  Indication: Extrapyramidal Reaction caused by Medications   carbamazepine 200 MG tablet Commonly known as: TEGRETOL 1 IN AM 2 AT HS  Indication: Manic-Depression   ibuprofen 600 MG tablet Commonly known as: ADVIL Take 1 tablet (600 mg total) by mouth every 6 (six) hours as needed for moderate pain.  Indication: Pain   risperidone 4 MG tablet Commonly known as: RISPERDAL 1 in am 2 at hs x 4 days Then 2 q hs then on  Indication: Manic Phase of Manic-Depression   temazepam 30 MG capsule Commonly known as: RESTORIL Take 1 capsule (30 mg total) by mouth at bedtime.  Indication: Trouble Sleeping      Follow-up Information    [facility] Follow up on 04/25/2019.   Why: Please attend your intake appointment for therapy and medication management services on Wednesday, 11/4 at 3:30p.  Be sure to bring your photo ID, insurance card, and current medications.  Contact information: [address] [place] [address] [phone]           Signed: Malvin Johns, MD 04/13/2019, 10:37 AM

## 2019-04-13 NOTE — Tx Team (Signed)
Interdisciplinary Treatment and Diagnostic Plan Update  04/13/2019 Time of Session: 10:35am Lindsay [name] MRN: [id number]  Principal Diagnosis: <principal problem not specified>  Secondary Diagnoses: Active Problems:   Schizophrenia (HCC)   Current Medications:  Current Facility-Administered Medications  Medication Dose Route Frequency Provider Last Rate Last Dose  . acetaminophen (TYLENOL) tablet 650 mg  650 mg Oral Q6H PRN Oneta Rack, NP      . alum & mag hydroxide-simeth (MAALOX/MYLANTA) 200-200-20 MG/5ML suspension 30 mL  30 mL Oral Q4H PRN Oneta Rack, NP      . benztropine (COGENTIN) tablet 1 mg  1 mg Oral BID Malvin Johns, MD   1 mg at 04/13/19 2025  . carbamazepine (TEGRETOL) tablet 200 mg  200 mg Oral TID Malvin Johns, MD   200 mg at 04/13/19 1250  . hydrOXYzine (ATARAX/VISTARIL) tablet 25 mg  25 mg Oral TID PRN Oneta Rack, NP      . LORazepam (ATIVAN) tablet 2 mg  2 mg Oral Q4H PRN Malvin Johns, MD       Or  . LORazepam (ATIVAN) injection 2 mg  2 mg Intramuscular Q4H PRN Malvin Johns, MD      . magnesium hydroxide (MILK OF MAGNESIA) suspension 30 mL  30 mL Oral Daily PRN Oneta Rack, NP      . risperiDONE (RISPERDAL) tablet 4 mg  4 mg Oral TID Malvin Johns, MD   4 mg at 04/13/19 1250  . temazepam (RESTORIL) capsule 30 mg  30 mg Oral QHS Malvin Johns, MD   30 mg at 04/12/19 1939  . traZODone (DESYREL) tablet 50 mg  50 mg Oral QHS PRN Oneta Rack, NP      . ziprasidone (GEODON) injection 20 mg  20 mg Intramuscular Q6H PRN Malvin Johns, MD       PTA Medications: Medications Prior to Admission  Medication Sig Dispense Refill Last Dose  . HYDROcodone-acetaminophen (NORCO/VICODIN) 5-325 MG tablet Take 1-2 tablets by mouth every 4 (four) hours as needed for moderate pain. (Patient not taking: Reported on 04/09/2019) 20 tablet 0   . ibuprofen (ADVIL) 600 MG tablet Take 1 tablet (600 mg total) by mouth every 6 (six) hours as needed for moderate pain. (Patient  not taking: Reported on 04/09/2019) 30 tablet 0   . labetalol (NORMODYNE) 200 MG tablet Take 1 tablet (200 mg total) by mouth 2 (two) times daily. (Patient not taking: Reported on 04/09/2019) 60 tablet 0     Patient Stressors: Financial difficulties Medication change or noncompliance  Patient Strengths: General fund of knowledge Supportive family/friends  Treatment Modalities: Medication Management, Group therapy, Case management,  1 to 1 session with clinician, Psychoeducation, Recreational therapy.   Physician Treatment Plan for Primary Diagnosis: <principal problem not specified> Long Term Goal(s): Improvement in symptoms so as ready for discharge Improvement in symptoms so as ready for discharge   Short Term Goals: Ability to disclose and discuss suicidal ideas Ability to identify and develop effective coping behaviors will improve Ability to maintain clinical measurements within normal limits will improve Compliance with prescribed medications will improve Ability to identify triggers associated with substance abuse/mental health issues will improve Ability to disclose and discuss suicidal ideas Ability to demonstrate self-control will improve Ability to identify and develop effective coping behaviors will improve  Medication Management: Evaluate patient's response, side effects, and tolerance of medication regimen.  Therapeutic Interventions: 1 to 1 sessions, Unit Group sessions and Medication administration.  Evaluation of Outcomes: Adequate for  Discharge  Physician Treatment Plan for Secondary Diagnosis: Active Problems:   Schizophrenia (HCC)  Long Term Goal(s): Improvement in symptoms so as ready for discharge Improvement in symptoms so as ready for discharge   Short Term Goals: Ability to disclose and discuss suicidal ideas Ability to identify and develop effective coping behaviors will improve Ability to maintain clinical measurements within normal limits will  improve Compliance with prescribed medications will improve Ability to identify triggers associated with substance abuse/mental health issues will improve Ability to disclose and discuss suicidal ideas Ability to demonstrate self-control will improve Ability to identify and develop effective coping behaviors will improve     Medication Management: Evaluate patient's response, side effects, and tolerance of medication regimen.  Therapeutic Interventions: 1 to 1 sessions, Unit Group sessions and Medication administration.  Evaluation of Outcomes: Adequate for Discharge   RN Treatment Plan for Primary Diagnosis: <principal problem not specified> Long Term Goal(s): Knowledge of disease and therapeutic regimen to maintain health will improve  Short Term Goals: Ability to participate in decision making will improve, Ability to verbalize feelings will improve, Ability to disclose and discuss suicidal ideas, Ability to identify and develop effective coping behaviors will improve and Compliance with prescribed medications will improve  Medication Management: RN will administer medications as ordered by provider, will assess and evaluate patient's response and provide education to patient for prescribed medication. RN will report any adverse and/or side effects to prescribing provider.  Therapeutic Interventions: 1 on 1 counseling sessions, Psychoeducation, Medication administration, Evaluate responses to treatment, Monitor vital signs and CBGs as ordered, Perform/monitor CIWA, COWS, AIMS and Fall Risk screenings as ordered, Perform wound care treatments as ordered.  Evaluation of Outcomes: Adequate for Discharge   LCSW Treatment Plan for Primary Diagnosis: <principal problem not specified> Long Term Goal(s): Safe transition to appropriate next level of care at discharge, Engage patient in therapeutic group addressing interpersonal concerns.  Short Term Goals: Engage patient in aftercare planning  with referrals and resources and Increase skills for wellness and recovery  Therapeutic Interventions: Assess for all discharge needs, 1 to 1 time with Social worker, Explore available resources and support systems, Assess for adequacy in community support network, Educate family and significant other(s) on suicide prevention, Complete Psychosocial Assessment, Interpersonal group therapy.  Evaluation of Outcomes: Adequate for Discharge   Progress in Treatment: Attending groups: Yes. Participating in groups: Yes. Taking medication as prescribed: Yes. Toleration medication: Yes. Family/Significant other contact made: Yes, individual(s) contacted:  pt's mom Patient understands diagnosis: Yes. Discussing patient identified problems/goals with staff: Yes. Medical problems stabilized or resolved: Yes. Denies suicidal/homicidal ideation: Yes. Issues/concerns per patient self-inventory: No. Other:   New problem(s) identified: No, Describe:  None  New Short Term/Long Term Goal(s): Medication stabilization, elimination of SI thoughts, and development of a comprehensive mental wellness plan.   Patient Goals:  "make sure I don't have medications as a remedy for trauma"  Discharge Plan or Barriers: Patient is discharging home and will be starting outpatient services with [facility]   Reason for Continuation of Hospitalization: Patient is discharging today.   Estimated Length of Stay: Patient is discharging today.   Attendees: Patient:  04/13/2019   Physician: Dr. Malvin JohnsBrian Farah, MD 04/13/2019   Nursing: Luanne BrasRoni, RN 04/13/2019   RN Care Manager: 04/13/2019   Social Worker: Stephannie PetersJasmine Indea Dearman, LCSW  04/13/2019  Recreational Therapist:  04/13/2019   Other:  04/13/2019  Other:  04/13/2019   Other: 04/13/2019     Scribe for Treatment Team: Leavy CellaJasmine  Edythe Clarity, LCSW 04/13/2019 2:17 PM

## 2019-04-13 NOTE — Progress Notes (Signed)
  [facility] Adult Case Management Discharge Plan :  Will you be returning to the same living situation after discharge:  Yes,  home At discharge, do you have transportation home?: Yes,  pt's mom/family Do you have the ability to pay for your medications: Yes,  private insurance  Release of information consent forms completed and in the chart;  Patient's signature needed at discharge.  Patient to Follow up at: Follow-up Information    [facility] Follow up on 04/25/2019.   Why: Please attend your intake appointment for therapy and medication management services on Wednesday, 11/4 at 3:30p.  Be sure to bring your photo ID, insurance card, and current medications.  Contact information: [address] [place] [address] [phone]           Next level of care provider has access to [place] and Suicide Prevention discussed: Yes,  pt's mother  Have you used any form of tobacco in the last 30 days? (Cigarettes, Smokeless Tobacco, Cigars, and/or Pipes): No  Has patient been referred to the Quitline?: N/A patient is not a smoker  Patient has been referred for addiction treatment: Yes  Trecia Rogers, LCSW 04/13/2019, 10:30 AM

## 2019-04-13 NOTE — Progress Notes (Signed)
Recreation Therapy Notes  Date: 10.23.20 Time: 1000 Location: 500 Hall  Group Topic: Team Building  Goal Area(s) Addresses:  Patient will effectively work with group towards shared goal. Patient will identify skills needed to complete activity. Patient will verbalize how skills can be used post d/c.  Behavioral Response:  Engaged  Intervention: Group Activity  Activity:  Sharks in [employer].  Each patient was given are rubber disc and as a whole the group was given a disc.  Patients were to work together using the disc to maneuver the group from one end of the hall to the other and back.  If anyone stepped off their disc the group would start over.  Education: Team Building, Discharge Planning  Education Outcome: Acknowledges understanding/In group clarification offered/Needs additional education.   Clinical Observations/Feedback: Pt gathered to group together to hear everyone's ideas on how to complete the activity.  Pt was supportive and in tune to what her peers had to say.  Pt was fully engaged during activity.    Victorino Sparrow, LRT/CTRS         Ria Comment, [place] A 04/13/2019 11:07 AM

## 2019-05-09 ENCOUNTER — Encounter (HOSPITAL_COMMUNITY): Payer: Self-pay

## 2019-10-20 IMAGING — US US MFM FETAL BPP WO NON STRESS
1 series · 12 of 16 positions shown · non-contrast
Comparison: none

[Series 1: us mfm fetal bpp wo non stress · 16 acquisitions, 12 frames shown]
[im 1/16]
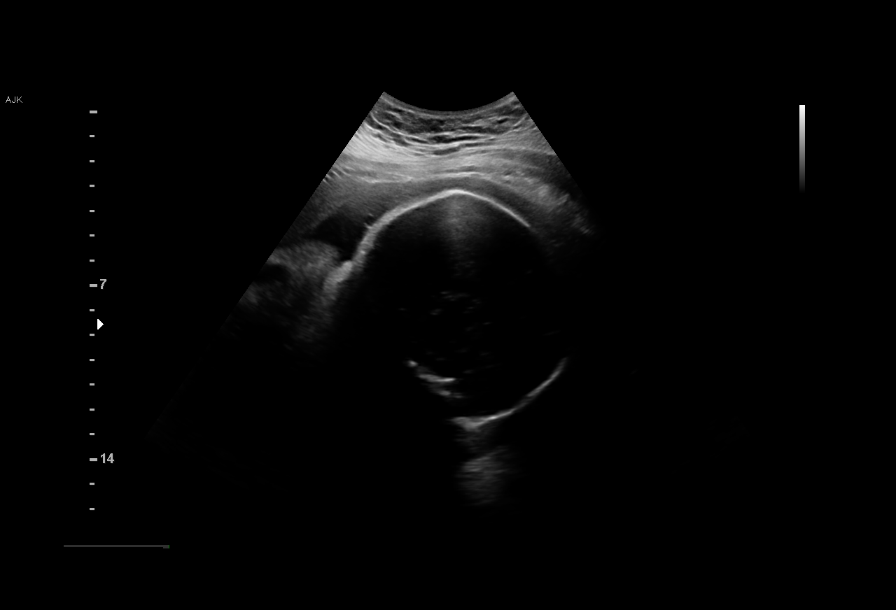
[im 3/16]
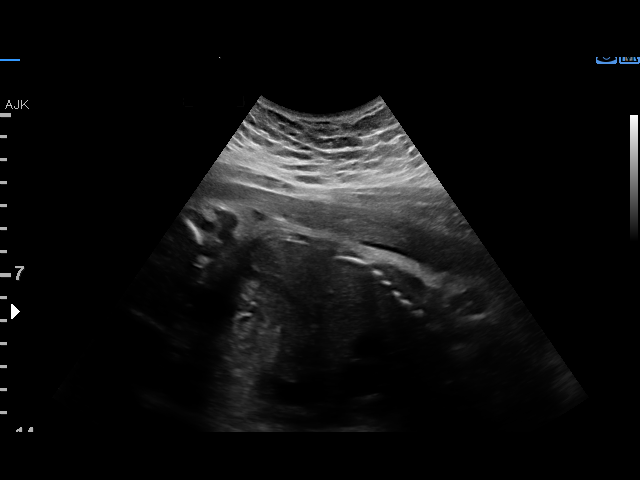
[im 4/16]
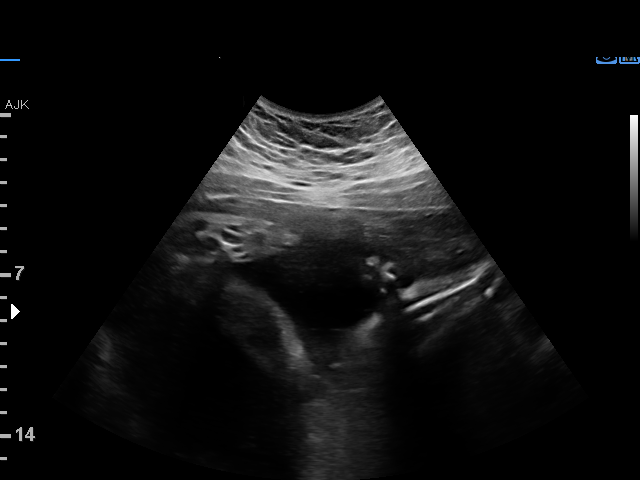
[im 5/16]
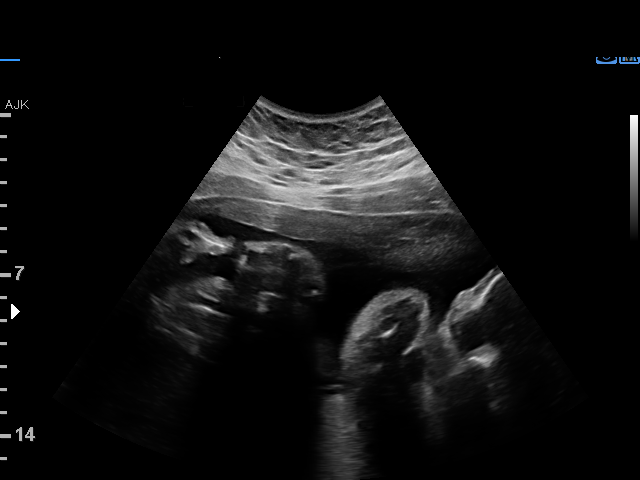
[im 7/16]
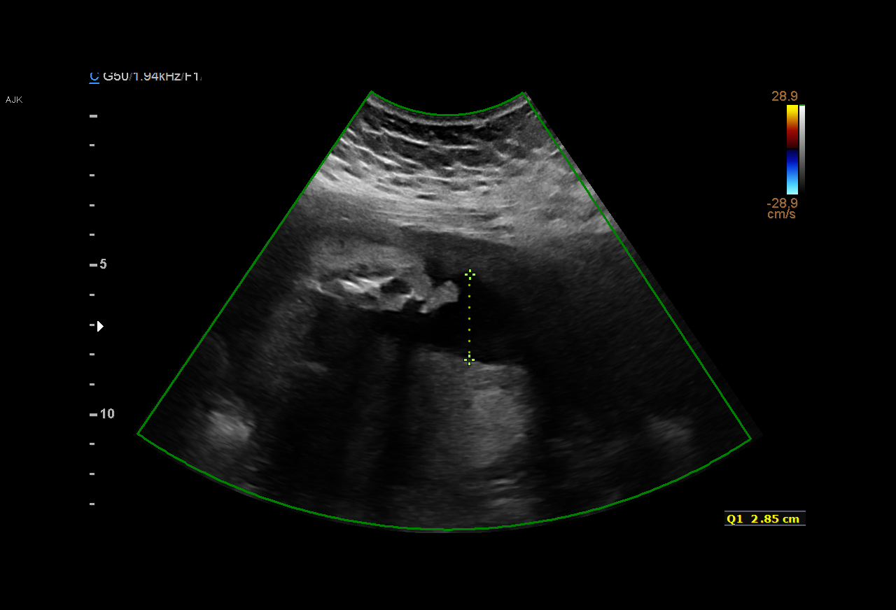
[im 8/16]
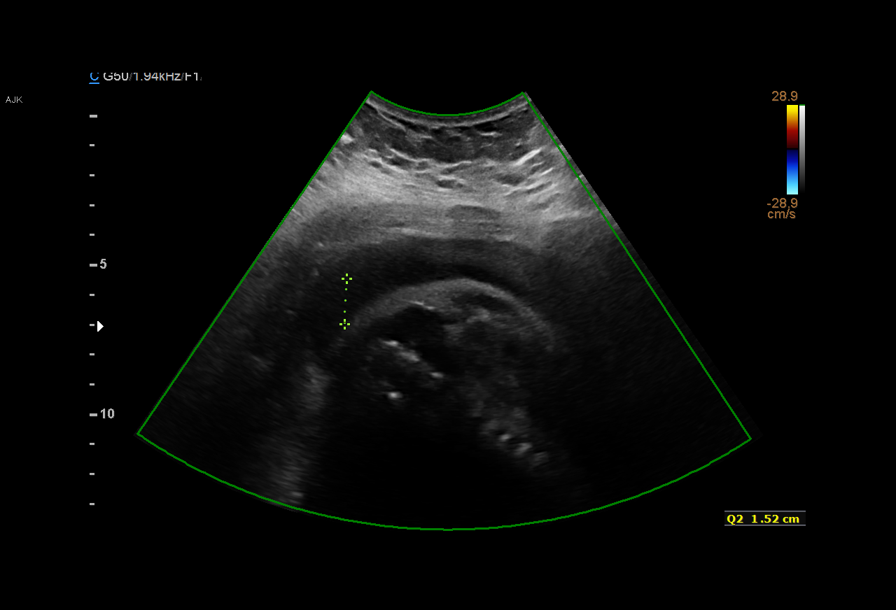
[im 9/16]
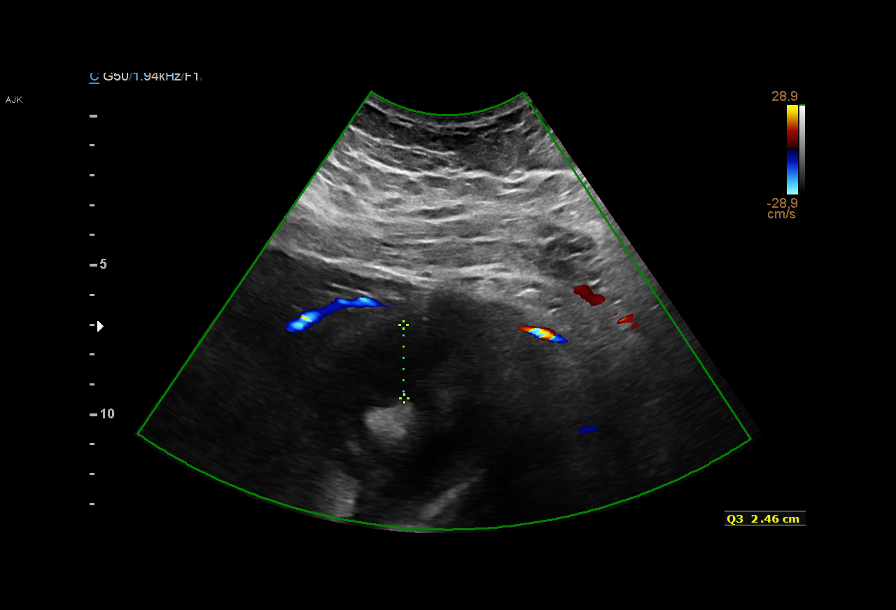
[im 11/16]
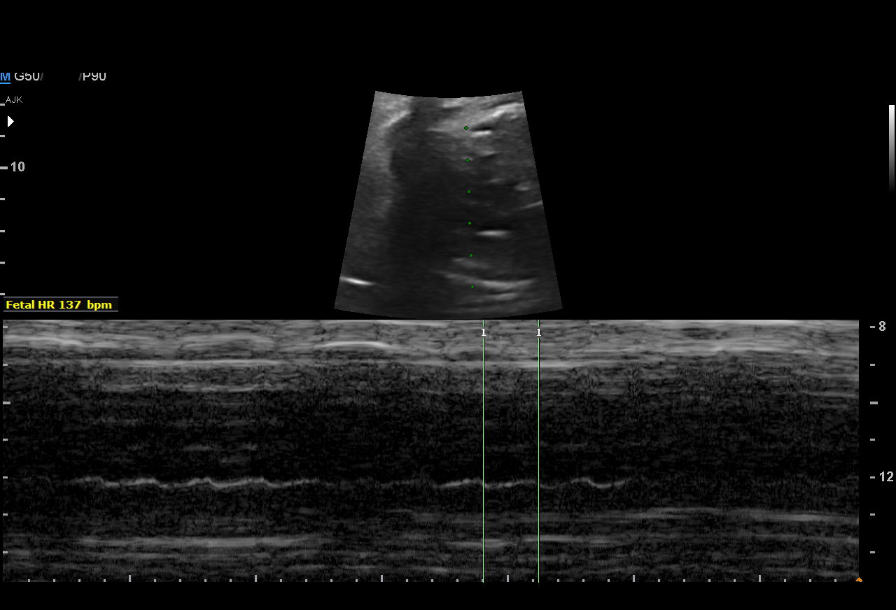
[im 12/16]
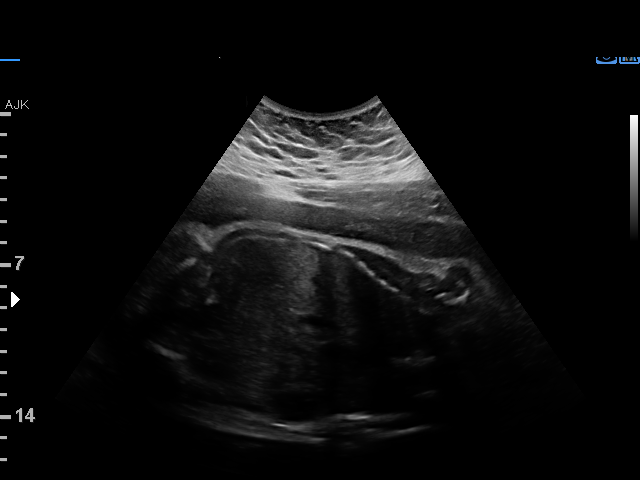
[im 13/16]
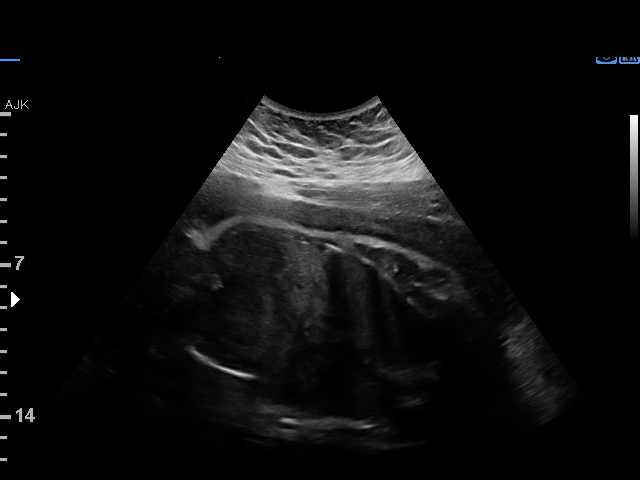
[im 15/16]
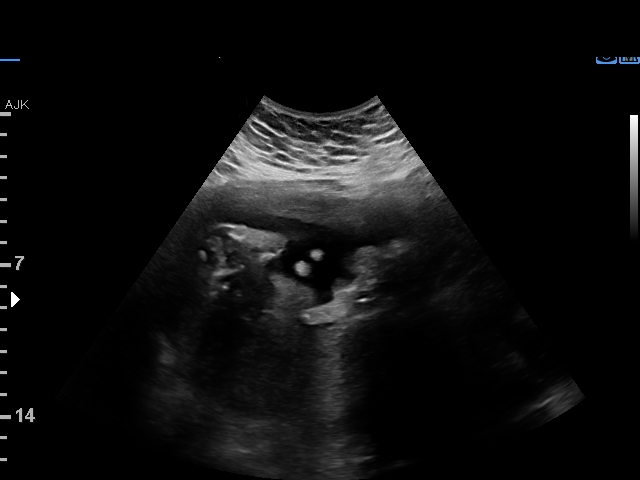
[im 16/16]
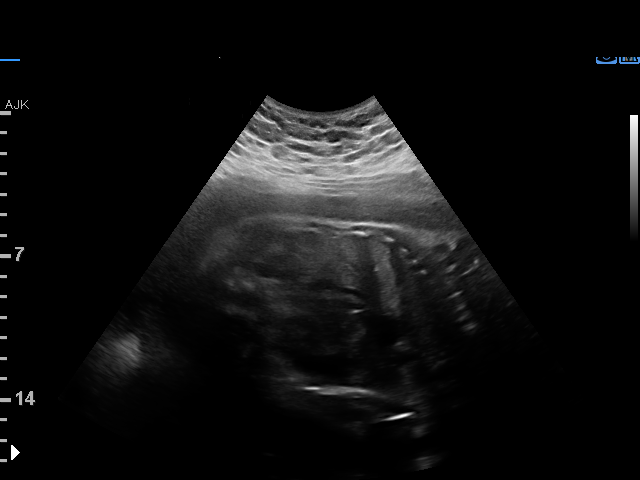

[12 of 16 positions shown; findings below may reference images not displayed]

----------------------------------------------------------------------

 ----------------------------------------------------------------------
Indications

  35 weeks gestation of pregnancy
  Decreased fetal movement
  Maternal morbid obesity
 ----------------------------------------------------------------------
Vital Signs

 BMI:
Fetal Evaluation

 Num Of Fetuses:         1
 Fetal Heart Rate(bpm):  137
 Cardiac Activity:       Observed
 Presentation:           Cephalic

 Amniotic Fluid
 AFI FV:      Within normal limits

 AFI Sum(cm)     %Tile       Largest Pocket(cm)
 8.5             10

 RUQ(cm)       RLQ(cm)       LUQ(cm)        LLQ(cm)

Biophysical Evaluation

 Amniotic F.V:   Within normal limits       F. Tone:        Observed
 F. Movement:    Observed                   Score:          [DATE]
 F. Breathing:   Observed
OB History

 Gravidity:    1
Gestational Age

 LMP:           35w 2d        Date:  05/10/18                 EDD:   02/14/19
 Best:          35w 2d     Det. By:  LMP  (05/10/18)          EDD:   02/14/19
Impression

 Patient was evaluated for c/o decreased fetal movements.
 Amniotic fluid is normal and good fetal activity is seen.
 Antenatal testing is reassuring. BPP [DATE].
                 [place], Abran

## 2020-09-11 ENCOUNTER — Emergency Department (HOSPITAL_COMMUNITY)
Admission: EM | Admit: 2020-09-11 | Discharge: 2020-09-12 | Disposition: A | Discharge status: Home / Self Care | Payer: Medicaid Other | Attending: Emergency Medicine | Admitting: Emergency Medicine

## 2020-09-11 ENCOUNTER — Other Ambulatory Visit: Payer: Self-pay

## 2020-09-11 ENCOUNTER — Encounter (HOSPITAL_COMMUNITY): Payer: Self-pay | Admitting: *Deleted

## 2020-09-11 DIAGNOSIS — F209 Schizophrenia, unspecified: Secondary | ICD-10-CM | POA: Insufficient documentation

## 2020-09-11 DIAGNOSIS — J45909 Unspecified asthma, uncomplicated: Secondary | ICD-10-CM | POA: Insufficient documentation

## 2020-09-11 DIAGNOSIS — Z20822 Contact with and (suspected) exposure to covid-19: Secondary | ICD-10-CM | POA: Insufficient documentation

## 2020-09-11 DIAGNOSIS — F200 Paranoid schizophrenia: Secondary | ICD-10-CM

## 2020-09-11 DIAGNOSIS — R4182 Altered mental status, unspecified: Secondary | ICD-10-CM | POA: Diagnosis not present

## 2020-09-11 LAB — COMPREHENSIVE METABOLIC PANEL
ALT: 16 U/L (ref 0–44)
AST: 16 U/L (ref 15–41)
Albumin: 4.5 g/dL (ref 3.5–5.0)
Alkaline Phosphatase: 60 U/L (ref 38–126)
Anion gap: 12 (ref 5–15)
BUN: 11 mg/dL (ref 6–20)
CO2: 18 mmol/L — ABNORMAL LOW (ref 22–32)
Calcium: 9.4 mg/dL (ref 8.9–10.3)
Chloride: 106 mmol/L (ref 98–111)
Creatinine, Ser: 0.91 mg/dL (ref 0.44–1.00)
GFR, Estimated: >60 mL/min (ref >60)
Glucose, Bld: 107 mg/dL — ABNORMAL HIGH (ref 70–99)
Potassium: 4 mmol/L (ref 3.5–5.1)
Sodium: 136 mmol/L (ref 135–145)
Total Bilirubin: 1.2 mg/dL (ref 0.3–1.2)
Total Protein: 8.3 g/dL — ABNORMAL HIGH (ref 6.5–8.1)

## 2020-09-11 LAB — POC URINE PREG, ED: Preg Test, Ur: NEGATIVE

## 2020-09-11 LAB — RAPID URINE DRUG SCREEN, HOSP PERFORMED
Amphetamines: NOT DETECTED
Barbiturates: NOT DETECTED
Benzodiazepines: POSITIVE — AB
Cocaine: NOT DETECTED
Opiates: NOT DETECTED
Tetrahydrocannabinol: NOT DETECTED

## 2020-09-11 LAB — CBC WITH DIFFERENTIAL/PLATELET
Abs Immature Granulocytes: 0.04 10*3/uL (ref 0.00–0.07)
Basophils Absolute: 0 10*3/uL (ref 0.0–0.1)
Basophils Relative: 0 %
Eosinophils Absolute: 0 10*3/uL (ref 0.0–0.5)
Eosinophils Relative: 0 %
HCT: 41.7 % (ref 36.0–46.0)
Hemoglobin: 12.7 g/dL (ref 12.0–15.0)
Immature Granulocytes: 0 %
Lymphocytes Relative: 10 %
Lymphs Abs: 1.5 10*3/uL (ref 0.7–4.0)
MCH: 21.7 pg — ABNORMAL LOW (ref 26.0–34.0)
MCHC: 30.5 g/dL (ref 30.0–36.0)
MCV: 71.4 fL — ABNORMAL LOW (ref 80.0–100.0)
Monocytes Absolute: 1 10*3/uL (ref 0.1–1.0)
Monocytes Relative: 7 %
Neutro Abs: 12 10*3/uL — ABNORMAL HIGH (ref 1.7–7.7)
Neutrophils Relative %: 83 %
Platelets: 322 10*3/uL (ref 150–400)
RBC: 5.84 MIL/uL — ABNORMAL HIGH (ref 3.87–5.11)
RDW: 15.9 % — ABNORMAL HIGH (ref 11.5–15.5)
WBC: 14.7 10*3/uL — ABNORMAL HIGH (ref 4.0–10.5)
nRBC: 0 % (ref 0.0–0.2)

## 2020-09-11 MED ORDER — RISPERIDONE 1 MG PO TABS: 2.0000 mg | ORAL_TABLET | Freq: Every day | ORAL | Status: DC

## 2020-09-11 MED ORDER — ZIPRASIDONE MESYLATE 20 MG IM SOLR
20.0000 mg | Freq: Once | INTRAMUSCULAR | Status: AC
  Administered 2020-09-11: 20 mg via INTRAMUSCULAR
  Filled 2020-09-11: qty 20

## 2020-09-11 MED ORDER — BENZTROPINE MESYLATE 1 MG PO TABS
1.0000 mg | ORAL_TABLET | Freq: Two times a day (BID) | ORAL | Status: DC
  Administered 2020-09-11 (×2): 1 mg via ORAL
  Filled 2020-09-11 (×3): qty 1

## 2020-09-11 MED ORDER — TEMAZEPAM 15 MG PO CAPS
30.0000 mg | ORAL_CAPSULE | Freq: Every day | ORAL | Status: DC
  Administered 2020-09-11: 30 mg via ORAL
  Filled 2020-09-11: qty 2

## 2020-09-11 MED ORDER — STERILE WATER FOR INJECTION IJ SOLN
INTRAMUSCULAR | Status: AC
  Filled 2020-09-11: qty 10

## 2020-09-11 NOTE — BH Assessment (Signed)
Clinician to assess pt once IVC paperwork is received. Clinician spoke to [place], Charity fundraiser.    Redmond Pulling, MS, [facility], [facility] Triage Specialist [phone]

## 2020-09-11 NOTE — ED Notes (Signed)
Pt agitated standing up on the side of the bed saying " I need to use the phone that is my right". Pt breathing Heavy in frustration.   MD made aware of pts behavior. MPD still at bedside. Sitter present.

## 2020-09-11 NOTE — ED Notes (Signed)
Pt was calm during EKG. Pt in no acute distress at this time. Pt resting in bed. Will continue to follow. MPD singed off. Pt no longer in shackles.

## 2020-09-11 NOTE — ED Provider Notes (Signed)
[facility] EMERGENCY DEPARTMENT Provider Note   CSN: [id number] Arrival date & time: 09/11/20  1323     History Chief Complaint  Patient presents with  . V70.1    Lindsay [name] is a 30 y.o. female.  Patient with a history of schizophrenia.  She has not been taking her medicines.  She has been hearing voices and is homicidal  The history is provided by the patient and the police. No language interpreter was used.  Altered Mental Status Presenting symptoms: behavior changes   Severity:  Severe Most recent episode:  More than 2 days ago Episode history:  Continuous Timing:  Constant Progression:  Worsening Chronicity:  Recurrent Context: not alcohol use   Associated symptoms: hallucinations   Associated symptoms: no abdominal pain, no headaches, no rash and no seizures        Past Medical History:  Diagnosis Date  . Anemia   . Asthma   . Homozygous alpha thalassemia (HCC)   . Hx of chlamydia infection   . Hx of scoliosis   . Pregnancy induced hypertension   . Schizophrenia [facility]     Patient Active Problem List   Diagnosis Date Noted  . Normal postpartum course 01/29/2019  . S/P primary low transverse C-section 01/29/2019  . Gestational hypertension 01/24/2019  . Gestational hypertension, third trimester 01/24/2019  . Transient hypertension 01/12/2019  . Schizophrenia (HCC) 10/18/2017    Past Surgical History:  Procedure Laterality Date  . CESAREAN SECTION N/A 01/25/2019   Procedure: CESAREAN SECTION;  Surgeon: Jaymes Graff, MD;  Location: [facility] LD ORS;  Service: Obstetrics;  Laterality: N/A;  . TONSILLECTOMY       OB History    Gravida  1   Para      Term      Preterm      AB      Living        SAB      IAB      Ectopic      Multiple      Live Births              Family History  Problem Relation Age of Onset  . Cancer Father        liver  . Hypertension Mother   . Diabetes Maternal Grandmother   . Cancer Maternal Grandmother      Social History   Tobacco Use  . Smoking status: Never Smoker  . Smokeless tobacco: Never Used  Vaping Use  . Vaping Use: Never used  Substance Use Topics  . Alcohol use: No  . Drug use: No    Home Medications Prior to Admission medications   Medication Sig Start Date End Date Taking? Authorizing Provider  benztropine (COGENTIN) 1 MG tablet Take 1 tablet (1 mg total) by mouth 2 (two) times daily. 04/13/19   Malvin Johns, MD  temazepam (RESTORIL) 30 MG capsule Take 1 capsule (30 mg total) by mouth at bedtime. 04/13/19   Malvin Johns, MD    Allergies    Patient has no known allergies.  Review of Systems   Review of Systems  Unable to perform ROS: Mental status change  Constitutional: Negative for appetite change and fatigue.  HENT: Negative for congestion, ear discharge and sinus pressure.   Eyes: Negative for discharge.  Respiratory: Negative for cough.   Cardiovascular: Negative for chest pain.  Gastrointestinal: Negative for abdominal pain and diarrhea.  Genitourinary: Negative for frequency and hematuria.  Musculoskeletal: Negative for back  pain.  Skin: Negative for rash.  Neurological: Negative for seizures and headaches.  Psychiatric/Behavioral: Positive for hallucinations.    Physical Exam Updated Vital Signs There were no vitals taken for this visit.  Physical Exam Vitals and nursing note reviewed.  Constitutional:      Appearance: She is well-developed.  HENT:     Head: Normocephalic.     Nose: Nose normal.  Eyes:     General: No scleral icterus.    Conjunctiva/sclera: Conjunctivae normal.  Neck:     Thyroid: No thyromegaly.  Cardiovascular:     Rate and Rhythm: Normal rate and regular rhythm.     Heart sounds: No murmur heard. No friction rub. No gallop.   Pulmonary:     Breath sounds: No stridor. No wheezing or rales.  Chest:     Chest wall: No tenderness.  Abdominal:     General: There is no distension.     Tenderness: There is no  abdominal tenderness. There is no rebound.  Musculoskeletal:        General: Normal range of motion.     Cervical back: Neck supple.  Lymphadenopathy:     Cervical: No cervical adenopathy.  Skin:    Findings: No erythema or rash.  Neurological:     General: No focal deficit present.     Mental Status: She is alert.     Motor: No abnormal muscle tone.     Coordination: Coordination normal.  Psychiatric:     Comments: Patient is hearing voices and is agitated     ED Results / Procedures / Treatments   Labs (all labs ordered are listed, but only abnormal results are displayed) Labs Reviewed  RESP PANEL BY RT-PCR (FLU A&B, COVID) ARPGX2  CBC WITH DIFFERENTIAL/PLATELET  COMPREHENSIVE METABOLIC PANEL  RAPID URINE DRUG SCREEN, HOSP PERFORMED  ETHANOL  POC URINE PREG, ED    EKG None  Radiology No results found.  Procedures Procedures   Medications Ordered in ED Medications  benztropine (COGENTIN) tablet 1 mg (has no administration in time range)  temazepam (RESTORIL) capsule 30 mg (has no administration in time range)    ED Course  I have reviewed the triage vital signs and the nursing notes.  Pertinent labs & imaging results that were available during my care of the patient were reviewed by me and considered in my medical decision making (see chart for details).    MDM Rules/Calculators/A&P                          Patient with schizophrenia and hallucinations she will be medically cleared and have TTS evaluate patient Final Clinical Impression(s) / ED Diagnoses Final diagnoses:  None    Rx / DC Orders ED Discharge Orders    None       Bethann Berkshire, MD 09/16/20 [phone]

## 2020-09-11 NOTE — ED Notes (Signed)
Assisted lab to obtain blood collection. Pt became more agitated with staff in the room. Pt drew her fist up at staff. [employer] in room to assist with lab draw. Pt remained calm and allowed staff to obtain lab collection. [employer] & sitter remain at bedside. Will continue to monitor patient.

## 2020-09-11 NOTE — ED Notes (Signed)
IVC paperwork faxed to [phone] and [phone]

## 2020-09-11 NOTE — ED Notes (Signed)
IVC paperwork faxed to the magistrate. 

## 2020-09-11 NOTE — ED Notes (Signed)
[place] [employer] at bedside. Pt paranoid and agitated. Allowed this RN to slowly obtain vital signs, but then became agitated and yelling "where is my son" shaking rails and attempting to jump from stretcher. [employer] placed patient in forensic restraints right wrist and right ankle. Skin intact and restraint loose enough to allow for ROM, no redness or indention in skin noted. Offered patient water and to go to restroom with no response.

## 2020-09-11 NOTE — ED Triage Notes (Signed)
Pt brought in by [facility] no IVC papers.  Officer states that pt with schizophrenia and has not been taking her meds, also pt tried to climb over the seats while officer was driving.

## 2020-09-11 NOTE — BH Assessment (Signed)
Clinician checked Onbase and fax at [facility] for pt's IVC paperwork. Clinician will continue to check back.     Redmond Pulling, MS, [facility], [facility] Triage Specialist [phone]

## 2020-09-11 NOTE — BH Assessment (Signed)
Comprehensive Clinical Assessment (CCA) Note  09/11/2020 Lindsay RichterWandra A [name] [id number]   Disposition: Lindsay ConnJason [name], [facility]. Per Lindsay [name], AC, RN [facility] pending discharges, with negative COVID test. Disposition discussed with Lindsay Parodyaitlin, RN.   Flowsheet Row [name] ([facility]) [name]  C-SSRS RISK CATEGORY No Risk No Risk No Risk     Per Lindsay Parodyaitlin, RN pt has a Comptrollersitter.   The patient demonstrates the following risk factors for suicide: Chronic risk factors for suicide include: psychiatric disorder of pt has previous diagnosis of schizophrenia. Acute risk factors for suicide include: pt denies suicidal thoughts. Protective factors for this patient include: clincian was unable to assess. Considering these factors, the overall suicide risk at this point appears to be low. Patient is appropriate for outpatient follow up.  Lindsay [name] is a 30 year old female who presents involuntary and accompanied to [facility]. Pt was a poor historian during the assessment. Clinician asked the pt, "what brought you to the hospital?" After a long pause, pt replied, "police officers." Clinician asked the pt follow up questions, after another pause pt replied, "they hurt me." Clinician asked the pt who hurt her. Pt did not respond. Pt reported, recently there's been times where she wanted to kill herself. Pt reported, she had a plan "to fall sleep and just..." Clinician clarified and asked the pt if she wanted to kill herself, pt replied, "I don't.[name]..I do.[name].[name].I don't." Pt reported, hearing voices telling her, she's a person that has to listen, a person that is controlled and bullied, a person that isn't able to vocalize what she feels. Clinician asked the pt if feels she's been watched/someone trying to get her and/or  seeing things. Pt did not respond. Pt reported, she had to be handled by Jesus, pt did not give additional details. Pt denies, SI, HI, access to weapons.   Pt was IVC'd by EDP. Per IVC paperwork: "Pt is homicidal."   Pt denies, substance use. Pt's UDS is pending. Pt denies, being linked to OPT resources (medication management and/or counseling.) Per chart [facility]  Pt presents alert in scrubs with blocked speech. Pt's eye contact was staring. During the assessment the pt answered few questions and has long pauses, pt would breathe heavy, stare in the camera and whimper. Pt reported, she does that after saying "Lindsay [name]." Pt reported, Lindsay [name] is a family friend that has her son and she trusts. Pt's mood, affect was labile.   Diagnosis: Schizophrenia [facility](HCC)  *Clinician asked the pt if she has family friend supports, pt just looked at clinician with no response.*  Chief Complaint:  Chief Complaint  Patient presents with  . V70.1   Visit Diagnosis:     CCA Screening, Triage and Referral (STR)  Patient Reported Information How did you hear about us? No data recorded Referral name: No data recorded Referral phone number: No data recorded  Whom do you see for routine medical problems? No data recorded Practice/Facility Name: No data recorded Practice/Facility Phone Number: No data recorded Name of Contact: No data recorded Contact Number: No data recorded Contact Fax Number: No data recorded Prescriber Name: No data recorded Prescriber Address (if known): No data recorded  What Is the Reason for Your Visit/Call Today? No data recorded How Long Has This Been Causing You Problems? No  data recorded What Do You Feel Would Help You the Most Today? No data recorded  Have You Recently Been in Any Inpatient Treatment (Hospital/Detox/Crisis Center/28-Day Program)? No data recorded Name/Location of Program/Hospital:No data recorded How Long Were You There?  No data recorded When Were You Discharged? No data recorded  Have You Ever Received Services From [facility] Before? No data recorded Who Do You See at [facility]? No data recorded  Have You Recently Had Any Thoughts About Hurting Yourself? No data recorded Are You Planning to Commit Suicide/Harm Yourself At This time? No data recorded  Have you Recently Had Thoughts About Hurting Someone Lindsay [name]? No data recorded Explanation: No data recorded  Have You Used Any Alcohol or Drugs in the Past 24 Hours? No data recorded How Long Ago Did You Use Drugs or Alcohol? No data recorded What Did You Use and How Much? No data recorded  Do You Currently Have a Therapist/Psychiatrist? No data recorded Name of Therapist/Psychiatrist: No data recorded  Have You Been Recently Discharged From Any Office Practice or Programs? No data recorded Explanation of Discharge From Practice/Program: No data recorded    CCA Screening Triage Referral Assessment Type of Contact: No data recorded Is this Initial or Reassessment? No data recorded Date Telepsych consult ordered in [facility]:  No data recorded Time Telepsych consult ordered in [facility]:  No data recorded  Patient Reported Information Reviewed? No data recorded Patient Left Without Being Seen? No data recorded Reason for Not Completing Assessment: No data recorded  Collateral Involvement: No data recorded  Does Patient Have a Court Appointed Legal Guardian? No data recorded Name and Contact of Legal Guardian: No data recorded If Minor and Not Living with Parent(s), Who has Custody? No data recorded Is CPS involved or ever been involved? No data recorded Is APS involved or ever been involved? No data recorded  Patient Determined To Be At Risk for Harm To Self or Others Based on Review of Patient Reported Information or Presenting Complaint? No data recorded Method: No data recorded Availability of Means: No data recorded Intent: No data  recorded Notification Required: No data recorded Additional Information for Danger to Others Potential: No data recorded Additional Comments for Danger to Others Potential: No data recorded Are There Guns or Other Weapons in Your Home? No data recorded Types of Guns/Weapons: No data recorded Are These Weapons Safely Secured?                            No data recorded Who Could Verify You Are Able To Have These Secured: No data recorded Do You Have any Outstanding Charges, Pending Court Dates, Parole/Probation? No data recorded Contacted To Inform of Risk of Harm To Self or Others: No data recorded  Location of Assessment: No data recorded  Does Patient Present under Involuntary Commitment? No data recorded IVC Papers Initial File Date: No data recorded  [place] of Residence: No data recorded  Patient Currently Receiving the Following Services: No data recorded  Determination of Need: No data recorded  Options For Referral: No data recorded    CCA Biopsychosocial Intake/Chief Complaint:  Per EDP note: " Patient with a history of schizophrenia. She has not been taking her medicines. She has been hearing voices and is homicidal."  Current Symptoms/Problems: Pt reports hearing voices, pt appeared to be presponding to internal stimuli during assessment, sadness.   Patient Reported Schizophrenia/Schizoaffective Diagnosis in Past: No data recorded  Strengths: Not  assessed.  Preferences: Not assessed.  Abilities: Not assessed.   Type of Services Patient Feels are Needed: Not assessed.   Initial Clinical Notes/Concerns: No data recorded  Mental Health Symptoms Depression:  Increase/decrease in appetite; Irritability; Difficulty Concentrating (Sadness.)   Duration of Depressive symptoms: No data recorded  Mania:  -- (UTA)   Anxiety:   -- (UTA)   Psychosis:  Hallucinations   Duration of Psychotic symptoms: No data recorded  Trauma:  -- (UTA)   Obsessions:  -- (UTA)    Compulsions:  -- (UTA)   Inattention:  -- (UTA)   Hyperactivity/Impulsivity:  -- (UTA)   Oppositional/Defiant Behaviors:  -- (UTA)   Emotional Irregularity:  No data recorded  Other Mood/Personality Symptoms:  No data recorded   Mental Status Exam Appearance and self-care  Stature:  No data recorded  Weight:  No data recorded  Clothing:  -- (Pt in scrubs.)   Grooming:  Normal   Cosmetic use:  None   Posture/gait:  Other (Comment) (Pt sitting in hospital bed.)   Motor activity:  Not Remarkable   Sensorium  Attention:  Confused   Concentration:  Preoccupied   Orientation:  Person   Recall/memory:  Defective in Immediate   Affect and Mood  Affect:  Labile   Mood:  Other (Comment) (labile.)   Relating  Eye contact:  Staring   Facial expression:  Anxious   Attitude toward examiner:  Guarded   Thought and Language  Speech flow: Blocked   Thought content:  No data recorded   Preoccupation:  -- (UTA)   Hallucinations:  Auditory   Organization:  No data recorded  [employer] of Knowledge:  Poor   Intelligence:  No data recorded  Abstraction:  -- (UTA)   Judgement:  Impaired   Reality Testing:  -- (UTA)   Insight:  Lacking   Decision Making:  -- Industrial/product designer)   Social Functioning  Social Maturity:  -- Industrial/product designer)   Social Judgement:  -- Industrial/product designer)   Stress  Stressors:  -- Industrial/product designer)   Coping Ability:  -- Industrial/product designer)   Skill Deficits:  Stage manager; Decision making   Supports:  -- Industrial/product designer)     Religion: Religion/Spirituality Are You A Religious Person?:  Industrial/product designer)  Leisure/Recreation: Leisure / Recreation Do You Have Hobbies?:  (UTA)  Exercise/Diet: Exercise/Diet Do You Exercise?:  (UTA) Have You Gained or Lost A Significant Amount of Weight in the Past Six Months?:  (UTA) Do You Follow a Special Diet?:  (UTA) Do You Have Any Trouble Sleeping?:  (UTA)   CCA Employment/Education Employment/Work Situation: Employment / Work  Situation Employment situation: Employed 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 What is the longest time patient has a held a job?: UTA Where was the patient employed 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)  Education: Education Is Patient Currently Attending School?:  (UTA) Did Garment/textile technologist From [employer]?:  (UTA) Did You Attend College?:  (UTA) Did You Attend Graduate School?:  (UTA)   CCA Family/Childhood History Family and Relationship History: Family history Marital status: Single Are you sexually active?:  (UTA.) What is your sexual orientation?: UTA Does patient have children?: Yes How many children?: 1 How is patient's relationship with their children?: UTA  Childhood History:  Childhood History By whom was/is the patient raised?: Other (Comment) (UTA) Additional childhood history information: UTA Description of patient's relationship with caregiver when they were a child:  UTA Patient's description of current relationship with people who raised him/her: UTA How were you disciplined when you got in trouble as a child/adolescent?: UTA Does patient have siblings?: Yes Number of Siblings:  (Pt did not answer.) Description of patient's current relationship with siblings: UTA Did patient suffer any verbal/emotional/physical/sexual abuse as a child?:  (UTA) Did patient suffer from severe childhood neglect?:  (UTA) Has patient ever been sexually abused/assaulted/raped as an adolescent or adult?:  (UTA) Was the patient ever a victim of a crime or a disaster?:  (UTA) Witnessed domestic violence?:  (UTA)  Child/Adolescent Assessment:     CCA Substance Use Alcohol/Drug Use: Alcohol / Drug Use Pain Medications: See MAR Prescriptions: See MAR Over the Counter: See MAR History of alcohol / drug use?: No history of alcohol / drug abuse (Pt denies.)    ASAM's:  Six Dimensions of Multidimensional  Assessment  Dimension 1:  Acute Intoxication and/or Withdrawal Potential:      Dimension 2:  Biomedical Conditions and Complications:      Dimension 3:  Emotional, Behavioral, or Cognitive Conditions and Complications:     Dimension 4:  Readiness to Change:     Dimension 5:  Relapse, Continued use, or Continued Problem Potential:     Dimension 6:  Recovery/Living Environment:     ASAM Severity Score:    ASAM Recommended Level of Treatment:     Substance use Disorder (SUD)    Recommendations for Services/Supports/Treatments: Recommendations for Services/Supports/Treatments Recommendations For Services/Supports/Treatments: Inpatient Hospitalization  DSM5 Diagnoses: Patient Active Problem List   Diagnosis Date Noted  . Normal postpartum course 01/29/2019  . S/P primary low transverse C-section 01/29/2019  . Gestational hypertension 01/24/2019  . Gestational hypertension, third trimester 01/24/2019  . Transient hypertension 01/12/2019  . Schizophrenia (HCC) 10/18/2017    Referrals to Alternative Service(s): Referred to Alternative Service(s):   Place:   Date:   Time:    Referred to Alternative Service(s):   Place:   Date:   Time:    Referred to Alternative Service(s):   Place:   Date:   Time:    Referred to Alternative Service(s):   Place:   Date:   Time:     Redmond Pulling, [facility]  Comprehensive Clinical Assessment (CCA) Screening, Triage and Referral Note  09/11/2020 Lindsay [name] [id number]  Chief Complaint:  Chief Complaint  Patient presents with  . V70.1   Visit Diagnosis:   Patient Reported Information How did you hear about [place]? No data recorded  Referral name: No data recorded  Referral phone number: No data recorded Whom do you see for routine medical problems? No data recorded  Practice/Facility Name: No data recorded  Practice/Facility Phone Number: No data recorded  Name of Contact: No data recorded  Contact Number: No data recorded  Contact Fax  Number: No data recorded  Prescriber Name: No data recorded  Prescriber Address (if known): No data recorded What Is the Reason for Your Visit/Call Today? No data recorded How Long Has This Been Causing You Problems? No data recorded Have You Recently Been in Any Inpatient Treatment (Hospital/Detox/Crisis Center/28-Day Program)? No data recorded  Name/Location of Program/Hospital:No data recorded  How Long Were You There? No data recorded  When Were You Discharged? No data recorded Have You Ever Received Services From [facility] Before? No data recorded  Who Do You See at [facility]? No data recorded Have You Recently Had Any Thoughts About Hurting Yourself? No data recorded  Are You Planning to  Commit Suicide/Harm Yourself At This time?  No data recorded Have you Recently Had Thoughts About Hurting Someone Lindsay [name]? No data recorded  Explanation: No data recorded Have You Used Any Alcohol or Drugs in the Past 24 Hours? No data recorded  How Long Ago Did You Use Drugs or Alcohol?  No data recorded  What Did You Use and How Much? No data recorded What Do You Feel Would Help You the Most Today? No data recorded Do You Currently Have a Therapist/Psychiatrist? No data recorded  Name of Therapist/Psychiatrist: No data recorded  Have You Been Recently Discharged From Any Office Practice or Programs? No data recorded  Explanation of Discharge From Practice/Program:  No data recorded    CCA Screening Triage Referral Assessment Type of Contact: No data recorded  Is this Initial or Reassessment? No data recorded  Date Telepsych consult ordered in CHL:  No data recorded  Time Telepsych consult ordered in [facility]L:  No data recorded Patient Reported Information Reviewed? No data recorded  Patient Left Without Being Seen? No data recorded  Reason for Not Completing Assessment: No data recorded Collateral Involvement: No data recorded Does Patient Have a Court Appointed Legal Guardian? No data  recorded  Name and Contact of Legal Guardian:  No data recorded If Minor and Not Living with Parent(s), Who has Custody? No data recorded Is CPS involved or ever been involved? No data recorded Is APS involved or ever been involved? No data recorded Patient Determined To Be At Risk for Harm To Self or Others Based on Review of Patient Reported Information or Presenting Complaint? No data recorded  Method: No data recorded  Availability of Means: No data recorded  Intent: No data recorded  Notification Required: No data recorded  Additional Information for Danger to Others Potential:  No data recorded  Additional Comments for Danger to Others Potential:  No data recorded  Are There Guns or Other Weapons in Your Home?  No data recorded   Types of Guns/Weapons: No data recorded   Are These Weapons Safely Secured?                              No data recorded   Who Could Verify You Are Able To Have These Secured:    No data recorded Do You Have any Outstanding Charges, Pending Court Dates, Parole/Probation? No data recorded Contacted To Inform of Risk of Harm To Self or Others: No data recorded Location of Assessment: No data recorded Does Patient Present under Involuntary Commitment? No data recorded  IVC Papers Initial File Date: No data recorded  [place] of Residence: No data recorded Patient Currently Receiving the Following Services: No data recorded  Determination of Need: No data recorded  Options For Referral: No data recorded  Redmond Pulling, [facility]     Redmond Pulling, MS, [facility], [facility] Triage Specialist [phone]

## 2020-09-11 NOTE — ED Notes (Signed)
[place] officer states family reported that pt with HI and planned to take pt to [place] but pt became uncooperative and police was called.

## 2020-09-11 NOTE — ED Notes (Signed)
Pt frightful to leave room. Bedside commode provided to patient. Nurse tech at bedside assisting pt to restroom.

## 2020-09-11 NOTE — ED Notes (Addendum)
Pt yelling out inconsolable. MD made aware.

## 2020-09-11 NOTE — ED Notes (Signed)
Pt uncooperative at present to be able to obtain vitals. Will assess soon.

## 2020-09-11 NOTE — ED Notes (Signed)
Pt did not provide urine sample. Will try again.

## 2020-09-12 ENCOUNTER — Encounter (HOSPITAL_COMMUNITY): Payer: Self-pay | Admitting: Psychiatry

## 2020-09-12 ENCOUNTER — Inpatient Hospital Stay (HOSPITAL_COMMUNITY)
Admission: AD | Admit: 2020-09-12 | Discharge: 2020-09-20 | DRG: 885 | Disposition: A | Discharge status: Home / Self Care | Payer: Medicaid Other | Source: Intra-hospital | Attending: Psychiatry | Admitting: Psychiatry

## 2020-09-12 DIAGNOSIS — Z800 Family history of malignant neoplasm of digestive organs: Secondary | ICD-10-CM | POA: Diagnosis not present

## 2020-09-12 DIAGNOSIS — Z9114 Patient's other noncompliance with medication regimen: Secondary | ICD-10-CM

## 2020-09-12 DIAGNOSIS — Z8249 Family history of ischemic heart disease and other diseases of the circulatory system: Secondary | ICD-10-CM

## 2020-09-12 DIAGNOSIS — Z833 Family history of diabetes mellitus: Secondary | ICD-10-CM

## 2020-09-12 DIAGNOSIS — F209 Schizophrenia, unspecified: Secondary | ICD-10-CM | POA: Diagnosis present

## 2020-09-12 DIAGNOSIS — F419 Anxiety disorder, unspecified: Secondary | ICD-10-CM | POA: Diagnosis not present

## 2020-09-12 DIAGNOSIS — F200 Paranoid schizophrenia: Secondary | ICD-10-CM | POA: Diagnosis not present

## 2020-09-12 DIAGNOSIS — R45850 Homicidal ideations: Secondary | ICD-10-CM | POA: Diagnosis present

## 2020-09-12 DIAGNOSIS — Z809 Family history of malignant neoplasm, unspecified: Secondary | ICD-10-CM

## 2020-09-12 DIAGNOSIS — F259 Schizoaffective disorder, unspecified: Secondary | ICD-10-CM | POA: Diagnosis not present

## 2020-09-12 DIAGNOSIS — F29 Unspecified psychosis not due to a substance or known physiological condition: Secondary | ICD-10-CM | POA: Diagnosis not present

## 2020-09-12 DIAGNOSIS — I10 Essential (primary) hypertension: Secondary | ICD-10-CM | POA: Diagnosis not present

## 2020-09-12 LAB — RESP PANEL BY RT-PCR (FLU A&B, COVID) ARPGX2
Influenza A by PCR: NEGATIVE
Influenza B by PCR: NEGATIVE
SARS Coronavirus 2 by RT PCR: NEGATIVE

## 2020-09-12 MED ORDER — ZIPRASIDONE MESYLATE 20 MG IM SOLR
20.0000 mg | Freq: Once | INTRAMUSCULAR | Status: AC
  Administered 2020-09-12: 20 mg via INTRAMUSCULAR

## 2020-09-12 MED ORDER — LORAZEPAM 2 MG/ML IJ SOLN
INTRAMUSCULAR | Status: AC
  Filled 2020-09-12: qty 1

## 2020-09-12 NOTE — ED Notes (Signed)
Pt asleep at this time. Equal rise and fall of the chest noted.

## 2020-09-12 NOTE — Progress Notes (Addendum)
ADDENDUM  Patient accepted to [facility], CSW no longer seeking placement.   Signed:  Corky Crafts, MSW, [place], LCASA 09/12/2020 2:29 PM  Pt. meets criteria for inpatient treatment per Nira Conn, NP. Referred out to the following hospitals:  Disposition CSW will continue to follow for placement   Destination  Service Provider Address Phone Fax  [facility]  [address] [place] [place] [address] [phone]  [facility]  [address] [place] [address] [phone]  [facility]  [address] [place] [address] [phone]      Signed:  Corky Crafts, MSW, LCSWA, LCASA 09/12/2020 9:10 AM

## 2020-09-12 NOTE — ED Notes (Signed)
Attempted report x 1 to [facility].

## 2020-09-12 NOTE — ED Notes (Signed)
Attempted report x2 to [facility]
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
## 2020-09-12 NOTE — ED Notes (Signed)
Assumed care of pt at 0700 upon entering pts room I introduced myself to pt.  I asked the patient if I could swab her nose for COVID.  Pt after reassurance from this nurse the patient pulled down her mask.  I asked her if I could swab her nose and she nodded her head yes.  Nasal swab successful and sent with patient cooperation.

## 2020-09-12 NOTE — ED Notes (Signed)
Pt sleeping at this time. Equal rise and fall of chest noted. 
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
## 2020-09-12 NOTE — ED Notes (Signed)
Attempted to call report to [facility]. Spoke with Alcario Drought, she refused report at this time and stated that she was leaving soon and I would need to call back at 8pm.

## 2020-09-12 NOTE — ED Notes (Signed)
[place] PD remains at bedside. [place] PD here to switch out.
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
## 2020-09-12 NOTE — BH Assessment (Signed)
Patient accepted to [facility] adult unit. Nira Conn, [facility] recommends inpatient treatment. The attending provider is Dr. Jola Babinski. Please send patient to [facility] after 8pm. Nurse report [phone]. Nursing to arrange transport. Patient's nurse Herbert Seta, RN), provided updates.

## 2020-09-12 NOTE — ED Notes (Addendum)
Transfer papers provided to PD and pt being transported off unit at this time and appears to be in NAD. Pt remained in police custody and shackles throughout transport off unit.
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
## 2020-09-12 NOTE — ED Notes (Signed)
Called to room to speak with patient. Patient asked "so do I have the right to refused medications?" and stated that this RN forced her to take her PM meds. This RN also reinforced that she was not forced into taking her medications, as she was told what they were and if she wanted them, and she agreed. This RN also explained that the patient has complete ability to refuse medications up to a certain extent. This RN told patient that if she begins to act out, we will be forced to give her medication to keep staff and herself safe. Patient then stated "I'm not stupid and I ain't a [place] pig. I'm being calm. I'm not showing out." This RN agreed and thanked patient for her behavior. Patient then said "okay then, bye". Will continue to monitor.

## 2020-09-12 NOTE — ED Notes (Signed)
Violent restraint order actually d/c at 1533, but unable to change end time in Epic.

## 2020-09-13 ENCOUNTER — Other Ambulatory Visit: Payer: Self-pay

## 2020-09-13 DIAGNOSIS — F200 Paranoid schizophrenia: Secondary | ICD-10-CM

## 2020-09-13 LAB — LIPID PANEL
Cholesterol: 180 mg/dL (ref 0–200)
HDL: 50 mg/dL (ref >40)
LDL Cholesterol: 114 mg/dL — ABNORMAL HIGH (ref 0–99)
Total CHOL/HDL Ratio: 3.6 RATIO
Triglycerides: 79 mg/dL (ref <150)
VLDL: 16 mg/dL (ref 0–40)

## 2020-09-13 LAB — TSH: TSH: 1.357 u[IU]/mL (ref 0.350–4.500)

## 2020-09-13 MED ORDER — LORAZEPAM 1 MG PO TABS
1.0000 mg | ORAL_TABLET | ORAL | Status: AC | PRN
Start: 2020-09-13 — End: 2020-09-15
  Administered 2020-09-15: 1 mg via ORAL
  Filled 2020-09-13 (×2): qty 1

## 2020-09-13 MED ORDER — ZIPRASIDONE MESYLATE 20 MG IM SOLR
20.0000 mg | INTRAMUSCULAR | Status: AC | PRN
  Administered 2020-09-13: 20 mg via INTRAMUSCULAR
  Filled 2020-09-13: qty 20

## 2020-09-13 MED ORDER — WHITE PETROLATUM EX OINT
TOPICAL_OINTMENT | CUTANEOUS | Status: AC
  Administered 2020-09-13: 1
  Filled 2020-09-13: qty 5

## 2020-09-13 MED ORDER — ALUM & MAG HYDROXIDE-SIMETH 200-200-20 MG/5ML PO SUSP: 30.0000 mL | ORAL | Status: DC | PRN

## 2020-09-13 MED ORDER — MAGNESIUM HYDROXIDE 400 MG/5ML PO SUSP
30.0000 mL | Freq: Every day | ORAL | Status: DC | PRN
  Filled 2020-09-13: qty 30

## 2020-09-13 MED ORDER — ACETAMINOPHEN 325 MG PO TABS: 650.0000 mg | ORAL_TABLET | Freq: Four times a day (QID) | ORAL | Status: DC | PRN

## 2020-09-13 MED ORDER — RISPERIDONE 1 MG PO TBDP
1.0000 mg | ORAL_TABLET | Freq: Every day | ORAL | Status: DC
  Filled 2020-09-13 (×3): qty 1

## 2020-09-13 MED ORDER — RISPERIDONE 2 MG PO TBDP
2.0000 mg | ORAL_TABLET | Freq: Three times a day (TID) | ORAL | Status: DC | PRN
  Administered 2020-09-15: 2 mg via ORAL
  Filled 2020-09-13 (×2): qty 1

## 2020-09-13 MED ORDER — RISPERIDONE 2 MG PO TBDP
2.0000 mg | ORAL_TABLET | Freq: Every day | ORAL | Status: DC
  Administered 2020-09-13: 2 mg via ORAL
  Filled 2020-09-13 (×3): qty 1
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
## 2020-09-13 NOTE — BHH Group Notes (Signed)
Psychoeducational Group Note  Date: 09-13-2020 Time: 0900-1000    Goal Setting   Purpose of Group: This group helps to provide patients with the steps of setting a goal that is specific, measurable, attainable, realistic and time specific. A discussion on how we keep ourselves stuck with negative self talk.    Participation Level:  Did not attend   Lindsay [name] A  

## 2020-09-13 NOTE — Progress Notes (Addendum)
Patient received Geodon 20 mg IM due to her increased agitation and anxiety. She was standing out in the hallway with no top on yelling and crying for no reason. She is very paranoid and delusional telling us that her name is [place] and not [place]. She made fist and yelled that she is tired of this. She walked in her room after much encouragement and then called writer Kassie Mends and started apologizing to me as well as calling Moshe Salisbury by the name of Sammy and apologizing to him also. Writer gave her a bible and encouraged her to try and relax so that she can rest. Will monitor effectiveness of medication.

## 2020-09-13 NOTE — Tx Team (Signed)
Initial Treatment Plan 09/13/2020 12:20 AM Lindsay [name] ZOX:[id number]    PATIENT STRESSORS: Other: Pt did not respond to question   PATIENT STRENGTHS: Physical Health Supportive family/friends   PATIENT IDENTIFIED PROBLEMS: Auditory hallucinations ("I talk to spirits")  Schizophrenia   Aggressive behavior  ("I want rest and peace")               DISCHARGE CRITERIA:  Adequate post-discharge living arrangements Improved stabilization in mood, thinking, and/or behavior Motivation to continue treatment in a less acute level of care Verbal commitment to aftercare and medication compliance  PRELIMINARY DISCHARGE PLAN: Attend PHP/IOP Outpatient therapy Return to previous living arrangement  PATIENT/FAMILY INVOLVEMENT: This treatment plan has been presented to and reviewed with the patient, Lindsay [name], and/or family member.  The patient and family have been given the opportunity to ask questions and make suggestions.  Victorino December, RN 09/13/2020, 12:20 AM

## 2020-09-13 NOTE — Progress Notes (Signed)
Adult Psychoeducational Group Note  Date:  09/13/2020 Time:  10:17 PM  Group Topic/Focus:  Wrap-Up Group:   The focus of this group is to help patients review their daily goal of treatment and discuss progress on daily workbooks.  Participation Level:  Did Not Attend  Participation Quality:  Did Not Attend  Affect:  Did Not Attend  Cognitive:  Did Not Attend  Insight: None  Engagement in Group:  Did Not Attend  Modes of Intervention:  Did Not Attend  Additional Comments:  Pt did not attend evening wrap up group.  Felipa Furnace 09/13/2020, 10:17 PM

## 2020-09-13 NOTE — BHH Group Notes (Signed)
  [facility] LCSW Group Therapy Note  Date/Time:  09/13/2020 11:15AM-12:00PM  Type of Therapy and Topic:  Group Therapy:  Feelings About Hospitalization  Participation Level:  Active   Description of Group This process group involved patients discussing their feelings related to being hospitalized, as well as the benefits they see to being in the hospital.  These feelings and benefits were itemized.  The group then brainstormed specific ways in which they could seek those same benefits when they discharge and return home.  Therapeutic Goals 1. Patient will identify and describe positive and negative feelings related to hospitalization 2. Patient will verbalize benefits of hospitalization to themselves personally 3. Patients will brainstorm together ways they can obtain similar benefits in the outpatient setting, identify barriers to wellness and possible solutions  Summary of Patient Progress:  The patient expressed her primary feelings about being hospitalized are "unfair."  When asked to say more to explain her feelings, she stared one at a time at the other patients before speaking.  She appeared to have some thought blocking.  Her points were mostly centered on her faith and on her opinions about medication.  She does not believe that medicines should be forced, or that medicines should be used so freely.  She stated she does not like to feel as though she is being controlled.  She feels that her life was fine prior to admission.  Therapeutic Modalities Cognitive Behavioral Therapy Motivational Interviewing    Ambrose Mantle, LCSW 09/13/2020, 3:22 PM

## 2020-09-13 NOTE — Progress Notes (Signed)
[facility] NOVEL CORONAVIRUS (COVID-19) DAILY CHECK-OFF SYMPTOMS - answer yes or no to each - every day NO YES  Have you had a fever in the past 24 hours?  . Fever (Temp > 37.80C / 100F) X   Have you had any of these symptoms in the past 24 hours? . New Cough .  Sore Throat  .  Shortness of Breath .  Difficulty Breathing .  Unexplained Body Aches   X   Have you had any one of these symptoms in the past 24 hours not related to allergies?   . Runny Nose .  Nasal Congestion .  Sneezing   X   If you have had runny nose, nasal congestion, sneezing in the past 24 hours, has it worsened?  X   EXPOSURES - check yes or no X   Have you traveled outside the state in the past 14 days?  X   Have you been in contact with someone with a confirmed diagnosis of COVID-19 or PUI in the past 14 days without wearing appropriate PPE?  X   Have you been living in the same home as a person with confirmed diagnosis of COVID-19 or a PUI (household contact)?    X   Have you been diagnosed with COVID-19?    X              What to do next: Answered NO to all: Answered YES to anything:   Proceed with unit schedule Follow the [facility] Inpatient Flowsheet.   

## 2020-09-13 NOTE — Progress Notes (Signed)
Patient can be heard in her room crying loudly when MHT go in her room to attempt to talk with her she gets louder. She will stop for a while and start up again.

## 2020-09-13 NOTE — H&P (Signed)
Psychiatric Admission Assessment Adult  Patient Identification: Lindsay [name] MRN:  [id number] Date of Evaluation:  09/13/2020 Chief Complaint:  Schizophrenia (HCC) [F20.9] Principal Diagnosis: <principal problem not specified> Diagnosis:  Active Problems:   Schizophrenia (HCC)  History of Present Illness: Patient is seen and examined.  Patient is a 30 year old female with a reported past psychiatric history significant for schizophrenia who originally presented to the [facility] emergency department under involuntary commitment.  The patient had been noncompliant with medications, and the family stated the patient had homicidal ideation and plan to take the patient to [facility] but became uncooperative, and police were called.  The patient was a poor historian, and her only question was whether or not she could refuse her medications this morning.  Previous examinations by the comprehensive clinical examination team did not provide a great deal more information.  The patient reported that she was to be handled by "Jesus" but would not give any additional information.  Urine drug screen was positive for benzodiazepines.  Her last psychiatric hospitalization at our facility was in October 2020.  Her diagnosis at that time included schizophrenia.  Her discharge medications included Cogentin, Tegretol, ibuprofen, Risperdal and temazepam.  This is her third psychiatric hospitalization at our facility.  Her first admission in May 2019 was her diagnosis with her first break of schizophrenia.  She had been given the long-acting Abilify injection secondary to refusal of medications.  She was admitted to the hospital for evaluation and stabilization.  Associated Signs/Symptoms: Depression Symptoms:  depressed mood, anhedonia, insomnia, psychomotor agitation, feelings of worthlessness/guilt, suicidal thoughts without plan, loss of energy/fatigue, disturbed sleep, Duration of Depression Symptoms: No data  recorded (Hypo) Manic Symptoms:  Delusions, Hallucinations, Impulsivity, Irritable Mood, Labiality of Mood, Anxiety Symptoms:  Excessive Worry, Psychotic Symptoms:  Delusions, Hallucinations: Auditory Paranoia, PTSD Symptoms: Negative Total Time spent with patient: 30 minutes  Past Psychiatric History: As far as we are aware this is the third psychiatric hospitalization for this patient.  Her last in our system was on 04/09/2019.  She apparently had her first break at age 30.  She has been previously treated with Risperdal as well as the long-acting Abilify injection.  Is the patient at risk to self? Yes.    Has the patient been a risk to self in the past 6 months? No.  Has the patient been a risk to self within the distant past? Yes.    Is the patient a risk to others? Yes.    Has the patient been a risk to others in the past 6 months? No.  Has the patient been a risk to others within the distant past? Yes.     Prior Inpatient Therapy:   Prior Outpatient Therapy:    Alcohol Screening: 1. How often do you have a drink containing alcohol?: Never 2. How many drinks containing alcohol do you have on a typical day when you are drinking?: 1 or 2 3. How often do you have six or more drinks on one occasion?: Never AUDIT-C Score: 0 9. Have you or someone else been injured as a result of your drinking?: No 10. Has a relative or friend or a doctor or another health worker been concerned about your drinking or suggested you cut down?: No Alcohol Use Disorder Identification Test Final Score (AUDIT): 0 Alcohol Brief Interventions/Follow-up: AUDIT Score <7 follow-up not indicated Substance Abuse History in the last 12 months:  No. Consequences of Substance Abuse: Negative Previous Psychotropic Medications: Yes  Psychological Evaluations:  Yes  Past Medical History:  Past Medical History:  Diagnosis Date  . Anemia   . Asthma   . Homozygous alpha thalassemia (HCC)   . Hx of chlamydia  infection   . Hx of scoliosis   . Pregnancy induced hypertension   . Schizophrenia [facility](HCC)     Past Surgical History:  Procedure Laterality Date  . CESAREAN SECTION N/A 01/25/2019   Procedure: CESAREAN SECTION;  Surgeon: Jaymes Graffillard, Naima, MD;  Location: [facility] LD ORS;  Service: Obstetrics;  Laterality: N/A;  . TONSILLECTOMY     Family History:  Family History  Problem Relation Age of Onset  . Cancer Father        liver  . Hypertension Mother   . Diabetes Maternal Grandmother   . Cancer Maternal Grandmother    Family Psychiatric  History: From review of the old chart it appears to be negative. Tobacco Screening: Have you used any form of tobacco in the last 30 days? (Cigarettes, Smokeless Tobacco, Cigars, and/or Pipes): No Social History:  Social History   Substance and Sexual Activity  Alcohol Use No     Social History   Substance and Sexual Activity  Drug Use No    Additional Social History:                           Allergies:  No Known Allergies Lab Results:  Results for orders placed or performed during the hospital encounter of 09/11/20 (from the past 48 hour(s))  CBC with Differential/Platelet     Status: Abnormal   Collection Time: 09/11/20  4:25 PM  Result Value Ref Range   WBC 14.7 (H) 4.0 - 10.5 K/uL   RBC 5.84 (H) 3.87 - 5.11 MIL/uL   Hemoglobin 12.7 12.0 - 15.0 g/dL   HCT 16.141.7 09.636.0 - 04.546.0 %   MCV 71.4 (L) 80.0 - 100.0 fL   MCH 21.7 (L) 26.0 - 34.0 pg   MCHC 30.5 30.0 - 36.0 g/dL   RDW 40.915.9 (H) 81.111.5 - 91.415.5 %   Platelets 322 150 - 400 K/uL   nRBC 0.0 0.0 - 0.2 %   Neutrophils Relative % 83 %   Neutro Abs 12.0 (H) 1.7 - 7.7 K/uL   Lymphocytes Relative 10 %   Lymphs Abs 1.5 0.7 - 4.0 K/uL   Monocytes Relative 7 %   Monocytes Absolute 1.0 0.1 - 1.0 K/uL   Eosinophils Relative 0 %   Eosinophils Absolute 0.0 0.0 - 0.5 K/uL   Basophils Relative 0 %   Basophils Absolute 0.0 0.0 - 0.1 K/uL   Immature Granulocytes 0 %   Abs Immature Granulocytes 0.04  0.00 - 0.07 K/uL    Comment: Performed at [facility], [address] [place], [place] [id number]  Comprehensive metabolic panel     Status: Abnormal   Collection Time: 09/11/20  4:25 PM  Result Value Ref Range   Sodium 136 135 - 145 mmol/L   Potassium 4.0 3.5 - 5.1 mmol/L   Chloride 106 98 - 111 mmol/L   CO2 18 (L) 22 - 32 mmol/L   Glucose, Bld 107 (H) 70 - 99 mg/dL    Comment: Glucose reference range applies only to samples taken after fasting for at least 8 hours.   BUN 11 6 - 20 mg/dL   Creatinine, Ser 6.210.91 0.44 - 1.00 mg/dL   Calcium 9.4 8.9 - 30.810.3 mg/dL   Total Protein 8.3 (H) 6.5 - 8.1 g/dL  Albumin 4.5 3.5 - 5.0 g/dL   AST 16 15 - 41 U/L   ALT 16 0 - 44 U/L   Alkaline Phosphatase 60 38 - 126 U/L   Total Bilirubin 1.2 0.3 - 1.2 mg/dL   GFR, Estimated >13 >08 mL/min    Comment: (NOTE) Calculated using the CKD-EPI Creatinine Equation (2021)    Anion gap 12 5 - 15    Comment: Performed at [facility], [address], [place], [place] [address]  Rapid urine drug screen (hospital performed)     Status: Abnormal   Collection Time: 09/11/20 10:24 PM  Result Value Ref Range   Opiates NONE DETECTED NONE DETECTED   Cocaine NONE DETECTED NONE DETECTED   Benzodiazepines POSITIVE (A) NONE DETECTED   Amphetamines NONE DETECTED NONE DETECTED   Tetrahydrocannabinol NONE DETECTED NONE DETECTED   Barbiturates NONE DETECTED NONE DETECTED    Comment: (NOTE) DRUG SCREEN FOR MEDICAL PURPOSES ONLY.  IF CONFIRMATION IS NEEDED FOR ANY PURPOSE, NOTIFY LAB WITHIN 5 DAYS.  LOWEST DETECTABLE LIMITS FOR URINE DRUG SCREEN Drug Class                     Cutoff (ng/mL) Amphetamine and metabolites    1000 Barbiturate and metabolites    200 Benzodiazepine                 200 Tricyclics and metabolites     300 Opiates and metabolites        300 Cocaine and metabolites        300 THC                            50 Performed at [facility], [address], [place], [place] [address]   POC urine  preg, ED     Status: None   Collection Time: 09/11/20 10:25 PM  Result Value Ref Range   Preg Test, Ur NEGATIVE NEGATIVE    Comment:        THE SENSITIVITY OF THIS METHODOLOGY IS >24 mIU/mL     Blood Alcohol level:  Lab Results  Component Value Date   ETH <10 04/08/2019   ETH <10 10/18/2017    Metabolic Disorder Labs:  Lab Results  Component Value Date   HGBA1C 5.8 (H) 04/11/2019   MPG 119.76 04/11/2019   Lab Results  Component Value Date   PROLACTIN 219.0 (H) 04/11/2019   No results found for: CHOL, TRIG, HDL, CHOLHDL, VLDL, LDLCALC  Current Medications: Current Facility-Administered Medications  Medication Dose Route Frequency Provider Last Rate Last Admin  . acetaminophen (TYLENOL) tablet 650 mg  650 mg Oral Q6H PRN Jaclyn Shaggy, PA-C      . alum & mag hydroxide-simeth (MAALOX/MYLANTA) 200-200-20 MG/5ML suspension 30 mL  30 mL Oral Q4H PRN Melbourne Abts W, PA-C      . risperiDONE (RISPERDAL M-TABS) disintegrating tablet 2 mg  2 mg Oral Q8H PRN Jaclyn Shaggy, PA-C       And  . LORazepam (ATIVAN) tablet 1 mg  1 mg Oral PRN Melbourne Abts W, PA-C       And  . ziprasidone (GEODON) injection 20 mg  20 mg Intramuscular PRN Melbourne Abts W, PA-C      . magnesium hydroxide (MILK OF MAGNESIA) suspension 30 mL  30 mL Oral Daily PRN Melbourne Abts W, PA-C      . risperiDONE (RISPERDAL M-TABS) disintegrating tablet 1 mg  1  mg Oral Daily Antonieta Pert, MD      . risperiDONE (RISPERDAL M-TABS) disintegrating tablet 2 mg  2 mg Oral QHS Jola Babinski Marlane Mingle, MD       PTA Medications: Medications Prior to Admission  Medication Sig Dispense Refill Last Dose  . albuterol (VENTOLIN HFA) 108 (90 Base) MCG/ACT inhaler Inhale 2 puffs into the lungs every 6 (six) hours as needed.     . risperiDONE (RISPERDAL) 1 MG tablet Take 1 mg by mouth at bedtime.       Musculoskeletal: Strength & Muscle Tone: within normal limits Gait & Station: normal Patient leans:  N/A            Psychiatric Specialty Exam:  Presentation  General Appearance: Disheveled  Eye Contact:None  Speech:Slow  Speech Volume:Decreased  Handedness:Right   Mood and Affect  Mood:Dysphoric; Irritable  Affect:Congruent   Thought Process  Thought Processes:Coherent  Duration of Psychotic Symptoms: Greater than six months  Past Diagnosis of Schizophrenia or Psychoactive disorder: Yes  Descriptions of Associations:Circumstantial  Orientation:Partial  Thought Content:Delusions; Paranoid Ideation  Hallucinations:Hallucinations: Auditory  Ideas of Reference:Delusions; Paranoia  Suicidal Thoughts:Suicidal Thoughts: No  Homicidal Thoughts:Homicidal Thoughts: No   Sensorium  Memory:Immediate Poor; Recent Poor; Remote Poor  Judgment:Impaired  Insight:Lacking   Executive Functions  Concentration:Fair  Attention Span:Fair  Recall:Poor  Fund of Knowledge:Poor  Language:Fair   Psychomotor Activity  Psychomotor Activity:Psychomotor Activity: Normal   Assets  Assets:Desire for Improvement; Resilience   Sleep  Sleep:Sleep: Fair Number of Hours of Sleep: 4.25    Physical Exam: Physical Exam Vitals and nursing note reviewed.  HENT:     Head: Normocephalic and atraumatic.  Pulmonary:     Effort: Pulmonary effort is normal.  Neurological:     General: No focal deficit present.     Mental Status: She is alert.    ROS unknown if currently breastfeeding. There is no height or weight on file to calculate BMI.  Treatment Plan Summary: Daily contact with patient to assess and evaluate symptoms and progress in treatment, Medication management and Plan : Patient is seen and examined.  Patient is a 30 year old female with a past psychiatric history significant for schizophrenia who was involuntarily committed secondary to paranoia, bizarre behavior, reported homicidal ideation, noncompliance with medications and poor self-care.  She  will be admitted to the hospital.  She will be integrated in the milieu.  She will be encouraged to attend groups.  We will contact her pharmacy and try and find out the last time that she had had medications.  On her last psychiatric hospitalization at our facility she was discharged on Risperdal, and we will start that at 1 mg p.o. daily and 2 mg p.o. nightly.  It appears back in 2020 she had been followed by [facility].  Review of the PMP database revealed her last prescription for temazepam back in October 2021.  Review of her admission laboratories revealed essentially normal electrolytes including creatinine at 0.91.  Her white blood cell count was mildly elevated at 14.7.  MCV was low at 71.4.  MCH was low at 21.7.  Platelets were normal at 322,000.  Her absolute neutrophil count was mildly elevated at 12.  The rest of her differential was normal.  Pregnancy test was negative.  Respiratory panel for influenza A, influenza B and coronavirus were negative.  Again, drug screen was positive for benzodiazepines.  Given the fact that she had benzodiazepines in her system we will empirically  go on and place lorazepam 1 mg p.o. every 6 hours as needed a CIWA greater than 10 in case she has been abusing benzodiazepines.  They did not get a blood alcohol, and its been 2 or 3 days since she has been sitting in the emergency room.  We will also monitor for withdrawal symptoms in case.  Her blood pressure last evening was elevated at 147/95.  Pulse was 108.  She was afebrile.  Pulse oximetry on room air was 100%.  She has been treated for hypertension in the past, and it looks like she was on Normodyne in the past.  We will start medication if necessary during the course of the hospitalization.  We will also contact her family for collateral information to see if we can find out what has been going on recently.  Observation Level/Precautions:  15 minute checks  Laboratory:  Chemistry  Profile  Psychotherapy:    Medications:    Consultations:    Discharge Concerns:    Estimated LOS:  Other:     Physician Treatment Plan for Primary Diagnosis: <principal problem not specified> Long Term Goal(s): Improvement in symptoms so as ready for discharge  Short Term Goals: Ability to identify changes in lifestyle to reduce recurrence of condition will improve, Ability to verbalize feelings will improve, Ability to disclose and discuss suicidal ideas, Ability to demonstrate self-control will improve, Ability to identify and develop effective coping behaviors will improve, Ability to maintain clinical measurements within normal limits will improve and Compliance with prescribed medications will improve  Physician Treatment Plan for Secondary Diagnosis: Active Problems:   Schizophrenia (HCC)  Long Term Goal(s): Improvement in symptoms so as ready for discharge  Short Term Goals: Ability to identify changes in lifestyle to reduce recurrence of condition will improve, Ability to verbalize feelings will improve, Ability to disclose and discuss suicidal ideas, Ability to demonstrate self-control will improve, Ability to identify and develop effective coping behaviors will improve, Ability to maintain clinical measurements within normal limits will improve and Compliance with prescribed medications will improve  I certify that inpatient services furnished can reasonably be expected to improve the patient's condition.    Antonieta Pert, MD 3/26/20221:06 PM

## 2020-09-13 NOTE — Progress Notes (Signed)
   09/12/20 2300  Psych Admission Type (Psych Patients Only)  Admission Status Involuntary  Psychosocial Assessment  Patient Complaints None  Eye Contact Brief  Facial Expression Flat  Affect Preoccupied  Speech Slow  Interaction Guarded;Hypervigilant  Motor Activity Slow  Appearance/Hygiene Body odor;In scrubs  Behavior Characteristics Cooperative;Guarded  Mood Suspicious;Preoccupied  Aggressive Behavior  Targets Other (Comment) (staff in ED)  Type of Behavior Striking out;Unprovoked  Effect No apparent injury  Thought Administrator, sports thinking  Content Blaming others;Preoccupation;Religiosity  Delusions Religious  Perception Hallucinations  Hallucination Auditory  Judgment Impaired  Confusion Moderate  Danger to Self  Current suicidal ideation? Denies  Danger to Others  Danger to Others Reported or observed (Reported by RN at [facility])

## 2020-09-13 NOTE — Progress Notes (Signed)
D. Pt observed sitting on the side of her bed upon initial approach this am. Pt responded "I'm sad", when asked how she was feeling. Pt proceeded to sob heavily for several minutes.  Writer stayed with patient, and offered emotional support. Pt appeared to be grieving, stated that she had lost a son. Pt appears to be thought blocking- is very slow to respond, but stated "I'm not hallucinating", when asked if she is hearing or seeing anything that isn't there.  Pt currently denies SI/HI , but stated, "I want to go to be with God. I wouldn't kill myself though". A. Labs and vitals monitored. Pt refused her risperdal this am, appeared suspicious, holding the medicine in her hand but would not put it in her mouth.  R. Pt remains safe with 15 minute checks. Will continue POC.

## 2020-09-13 NOTE — Progress Notes (Addendum)
Patient ID: Lindsay [name], female   DOB: 05-08-1991, 30 y.o.   MRN: [id number]  D: Pt here IVC from [facility]. Pt has history of schizophrenia and is off her medication. Pt denies SI, HI, VH and pain. Pt states "I talk to spirits. Sometimes they taunt me" Pt would not elaborate. Pt responding to internal stimuli, delayed responses and suspicious. Pt will not sit down in search room. Pt refuses to have VS assessed or to stand on the scale to be weighed. Pt refused to sign paperwork. Stands with her back to the patient lockers watching this Clinical research associate and the MHT.   Pt does not answer very many questions. Pt says she takes Risperdal but doesn't know last time she took it. Pt UDS was positive for benzodiazepines. Pt Has no complaints other than being here. "I don't know why I'm here. I don't want to be here. Can I refuse medication?" IVC process explained to patient. Pt also told that disruptive or violent behavior will warrant medications even if she refuses. "I can keep myself calm." Pt told that she will speak to a provider in the morning and it will be up to them how long she will remain inpatient and the treatment plan.   Pt does not provide any information. Pt does not confirm or deny that she lives with someone. Pt states that her sister, Lindsay [name], is a support person for her. Pt says her stressor is being here. Pt says she wants rest and peace.  Per Assessment Note: Lindsay [name] is a 30 year old female who presents involuntary and accompanied to [facility]. Pt was a poor historian during the assessment. Clinician asked the pt, "what brought you to the hospital?" After a long pause, pt replied, "police officers." Clinician asked the pt follow up questions, after another pause pt replied, "they hurt me." Clinician asked the pt who hurt her. Pt did not respond. Pt reported, recently there's been times where she wanted to kill herself. Pt reported, she had a plan "to fall sleep and just..." Clinician clarified and  asked the pt if she wanted to kill herself, pt replied, "I don't.[name].I do.Marland KitchenMarland KitchenI don't." Pt reported, hearing voices telling her, she's a person that has to listen, a person that is controlled and bullied, a person that isn't able to vocalize what she feels. Clinician asked the pt if feels she's been watched/someone trying to get her and/or seeing things. Pt did not respond. Pt reported, she had to be handled by Jesus, pt did not give additional details. Pt denies, SI, HI, access to weapons.   A: Pt was offered support and encouragement. Pt is somewhat cooperative during assessment. Pt consented to skin search but refused VS assessed and admission paperwork signing.  Belongings searched and contraband items placed in locker. Non-invasive skin search completed: skin intact. Pt offered food and drink and neither accepted. Pt introduced to unit milieu by nursing staff. Q 15 minute checks were started for safety.   R: Pt in room . Pt safety maintained on unit.

## 2020-09-14 DIAGNOSIS — F200 Paranoid schizophrenia: Secondary | ICD-10-CM | POA: Diagnosis not present

## 2020-09-14 LAB — HEMOGLOBIN A1C
Hgb A1c MFr Bld: 5.9 % — ABNORMAL HIGH (ref 4.8–5.6)
Mean Plasma Glucose: 122.63 mg/dL

## 2020-09-14 MED ORDER — RISPERIDONE 3 MG PO TBDP
3.0000 mg | ORAL_TABLET | Freq: Two times a day (BID) | ORAL | Status: DC
  Filled 2020-09-14 (×4): qty 1

## 2020-09-14 MED ORDER — DIPHENHYDRAMINE HCL 50 MG/ML IJ SOLN
25.0000 mg | INTRAMUSCULAR | Status: AC
  Administered 2020-09-14: 25 mg via INTRAMUSCULAR
  Filled 2020-09-14: qty 0.5

## 2020-09-14 MED ORDER — LORAZEPAM 2 MG/ML IJ SOLN
INTRAMUSCULAR | Status: AC
  Filled 2020-09-14: qty 1

## 2020-09-14 MED ORDER — HALOPERIDOL LACTATE 5 MG/ML IJ SOLN
10.0000 mg | INTRAMUSCULAR | Status: AC
  Administered 2020-09-14: 10 mg via INTRAMUSCULAR
  Filled 2020-09-14: qty 2

## 2020-09-14 MED ORDER — DIPHENHYDRAMINE HCL 50 MG/ML IJ SOLN
INTRAMUSCULAR | Status: AC
  Filled 2020-09-14: qty 1

## 2020-09-14 MED ORDER — ZIPRASIDONE MESYLATE 20 MG IM SOLR
INTRAMUSCULAR | Status: AC
  Filled 2020-09-14: qty 20

## 2020-09-14 MED ORDER — ZIPRASIDONE MESYLATE 20 MG IM SOLR
20.0000 mg | Freq: Once | INTRAMUSCULAR | Status: AC
  Administered 2020-09-14: 20 mg via INTRAMUSCULAR

## 2020-09-14 MED ORDER — LORAZEPAM 2 MG/ML IJ SOLN
2.0000 mg | INTRAMUSCULAR | Status: AC
  Administered 2020-09-14: 2 mg via INTRAMUSCULAR

## 2020-09-14 MED ORDER — ZIPRASIDONE MESYLATE 20 MG IM SOLR: 20.0000 mg | Freq: Four times a day (QID) | INTRAMUSCULAR | Status: DC | PRN

## 2020-09-14 MED ORDER — RISPERIDONE 3 MG PO TBDP
3.0000 mg | ORAL_TABLET | Freq: Two times a day (BID) | ORAL | Status: DC
  Administered 2020-09-14 – 2020-09-15 (×2): 3 mg via ORAL
  Filled 2020-09-14 (×4): qty 1

## 2020-09-14 MED ORDER — HALOPERIDOL LACTATE 5 MG/ML IJ SOLN
INTRAMUSCULAR | Status: AC
  Filled 2020-09-14: qty 2

## 2020-09-14 NOTE — Progress Notes (Signed)
[facility] NOVEL CORONAVIRUS (COVID-19) DAILY CHECK-OFF SYMPTOMS - answer yes or no to each - every day NO YES  Have you had a fever in the past 24 hours?  . Fever (Temp > 37.80C / 100F) X   Have you had any of these symptoms in the past 24 hours? . New Cough .  Sore Throat  .  Shortness of Breath .  Difficulty Breathing .  Unexplained Body Aches   X   Have you had any one of these symptoms in the past 24 hours not related to allergies?   . Runny Nose .  Nasal Congestion .  Sneezing   X   If you have had runny nose, nasal congestion, sneezing in the past 24 hours, has it worsened?  X   EXPOSURES - check yes or no X   Have you traveled outside the state in the past 14 days?  X   Have you been in contact with someone with a confirmed diagnosis of COVID-19 or PUI in the past 14 days without wearing appropriate PPE?  X   Have you been living in the same home as a person with confirmed diagnosis of COVID-19 or a PUI (household contact)?    X   Have you been diagnosed with COVID-19?    X              What to do next: Answered NO to all: Answered YES to anything:   Proceed with unit schedule Follow the [facility] Inpatient Flowsheet.   

## 2020-09-14 NOTE — Progress Notes (Signed)
[facility]  09/14/2020 10:13 AM Lindsay RichterWandra A [name]  MRN:  [id number] Subjective: Patient is a 30 year old female with a past psychiatric history significant for schizophrenia who was admitted on 09/13/2020 secondary to noncompliance with medications, and stated that the patient had homicidal ideation and increased irritability.  She also had delusions.  Objective: Patient is seen and examined.  Patient is a 30 year old female with the above-stated past psychiatric history who is seen in follow-up.  She was agitated early this morning, and required Geodon 20 mg IM x1.  Later in the morning she was yelling, agitated.  She was delusional about something to do with "my baby".  She was unable to be redirected.  She required an intramuscular injection of Haldol/Ativan/Benadryl secondary to her significant agitation.  She also required Geodon last night secondary to agitation and anxiety.  She was standing out in the hallway and yelling without reason.  She was crying profusely.  She was paranoid and delusional telling us that her name was "Lindsay [name]" and not [place].  Review of her laboratories revealed a lipid panel from 3/26 that was essentially normal.  Her hemoglobin A1c was mildly elevated at 5.9.  Pregnancy test was negative.  TSH was normal at 1.357.  She did sleep 6.25 hours last night after receiving the Geodon.  Her vital signs showed a mild elevation of blood pressure at 147/95.  Pulse was 108.  She was afebrile.  Pulse oximetry on room air was 100%.  Principal Problem: <principal problem not specified> Diagnosis: Active Problems:   Schizophrenia (HCC)  Total Time spent with patient: 20 minutes  Past Psychiatric History: See admission H&P  Past Medical History:  Past Medical History:  Diagnosis Date  . Anemia   . Asthma   . Homozygous alpha thalassemia (HCC)   . Hx of chlamydia infection   . Hx of scoliosis   . Pregnancy induced hypertension   . Schizophrenia [facility](HCC)     Past Surgical  History:  Procedure Laterality Date  . CESAREAN SECTION N/A 01/25/2019   Procedure: CESAREAN SECTION;  Surgeon: Jaymes Graffillard, Naima, MD;  Location: [facility] LD ORS;  Service: Obstetrics;  Laterality: N/A;  . TONSILLECTOMY     Family History:  Family History  Problem Relation Age of Onset  . Cancer Father        liver  . Hypertension Mother   . Diabetes Maternal Grandmother   . Cancer Maternal Grandmother    Family Psychiatric  History: See admission H&P Social History:  Social History   Substance and Sexual Activity  Alcohol Use No     Social History   Substance and Sexual Activity  Drug Use No    Social History   Socioeconomic History  . Marital status: Single    Spouse name: Not on file  . Number of children: Not on file  . Years of education: Not on file  . Highest education level: Not on file  Occupational History  . Not on file  Tobacco Use  . Smoking status: Never Smoker  . Smokeless tobacco: Never Used  Vaping Use  . Vaping Use: Never used  Substance and Sexual Activity  . Alcohol use: No  . Drug use: No  . Sexual activity: Yes    Birth control/protection: None  Other Topics Concern  . Not on file  Social History Narrative  . Not on file   Social Determinants of Health   Financial Resource Strain: Not on file  Food Insecurity: Not on file  Transportation Needs: Not on file  Physical Activity: Not on file  Stress: Not on file  Social Connections: Not on file   Additional Social History:                         Sleep: Good  Appetite:  Fair  Current Medications: Current Facility-Administered Medications  Medication Dose Route Frequency Provider Last Rate Last Admin  . acetaminophen (TYLENOL) tablet 650 mg  650 mg Oral Q6H PRN Jaclyn Shaggy, PA-C      . alum & mag hydroxide-simeth (MAALOX/MYLANTA) 200-200-20 MG/5ML suspension 30 mL  30 mL Oral Q4H PRN Melbourne Abts W, PA-C      . risperiDONE (RISPERDAL M-TABS) disintegrating tablet 2 mg  2 mg  Oral Q8H PRN Jaclyn Shaggy, PA-C       And  . LORazepam (ATIVAN) tablet 1 mg  1 mg Oral PRN Melbourne Abts W, PA-C      . magnesium hydroxide (MILK OF MAGNESIA) suspension 30 mL  30 mL Oral Daily PRN Melbourne Abts W, PA-C      . risperiDONE (RISPERDAL M-TABS) disintegrating tablet 1 mg  1 mg Oral Daily Antonieta Pert, MD      . risperiDONE (RISPERDAL M-TABS) disintegrating tablet 2 mg  2 mg Oral QHS Antonieta Pert, MD   2 mg at 09/13/20 2029  . ziprasidone (GEODON) injection 20 mg  20 mg Intramuscular Q6H PRN Antonieta Pert, MD        Lab Results:  Results for orders placed or performed during the hospital encounter of 09/12/20 (from the past 48 hour(s))  Lipid panel     Status: Abnormal   Collection Time: 09/13/20  6:31 PM  Result Value Ref Range   Cholesterol 180 0 - 200 mg/dL   Triglycerides 79 <098 mg/dL   HDL 50 >11 mg/dL   Total CHOL/HDL Ratio 3.6 RATIO   VLDL 16 0 - 40 mg/dL   LDL Cholesterol 914 (H) 0 - 99 mg/dL    Comment:        Total Cholesterol/HDL:CHD Risk Coronary Heart Disease Risk Table                     Men   Women  1/2 Average Risk   3.4   3.3  Average Risk       5.0   4.4  2 X Average Risk   9.6   7.1  3 X Average Risk  23.4   11.0        Use the calculated Patient Ratio above and the CHD Risk Table to determine the patient's CHD Risk.        ATP III CLASSIFICATION (LDL):  <100     mg/dL   Optimal  782-956  mg/dL   Near or Above                    Optimal  130-159  mg/dL   Borderline  213-086  mg/dL   High  >578     mg/dL   Very High Performed at [facility], [address] [place], [place] [address]   TSH     Status: None   Collection Time: 09/13/20  6:31 PM  Result Value Ref Range   TSH 1.357 0.350 - 4.500 uIU/mL    Comment: Performed by a 3rd Generation assay with a functional sensitivity of <=0.01 uIU/mL. Performed at [facility], [address] [place], [place] [address]   Hemoglobin A1c      Status: Abnormal   Collection Time: 09/13/20  6:31 PM  Result Value Ref Range   Hgb A1c MFr Bld 5.9 (H) 4.8 - 5.6 %    Comment: (NOTE) Pre diabetes:          5.7%-6.4%  Diabetes:              >6.4%  Glycemic control for   <7.0% adults with diabetes    Mean Plasma Glucose 122.63 mg/dL    Comment: Performed at [facility], [address] [place], [place] [address]    Blood Alcohol level:  Lab Results  Component Value Date   [facility] <10 04/08/2019   ETH <10 10/18/2017    Metabolic Disorder Labs: Lab Results  Component Value Date   HGBA1C 5.9 (H) 09/13/2020   MPG 122.63 09/13/2020   MPG 119.76 04/11/2019   Lab Results  Component Value Date   PROLACTIN 219.0 (H) 04/11/2019   Lab Results  Component Value Date   CHOL 180 09/13/2020   TRIG 79 09/13/2020   HDL 50 09/13/2020   CHOLHDL 3.6 09/13/2020   VLDL 16 09/13/2020   LDLCALC 114 (H) 09/13/2020    Physical Findings: AIMS: Facial and Oral Movements Muscles of Facial Expression: None, normal Lips and Perioral Area: None, normal Jaw: None, normal Tongue: None, normal,Extremity Movements Upper (arms, wrists, hands, fingers): None, normal Lower (legs, knees, ankles, toes): None, normal, Trunk Movements Neck, shoulders, hips: None, normal, Overall Severity Severity of abnormal movements (highest score from questions above): None, normal Incapacitation due to abnormal movements: None, normal Patient's awareness of abnormal movements (rate only patient's report): No Awareness, Dental Status Current problems with teeth and/or dentures?: No Does patient usually wear dentures?: No  CIWA:    COWS:     Musculoskeletal: Strength & Muscle Tone: within normal limits Gait & Station: normal Patient leans: N/A  Psychiatric Specialty Exam:  Presentation  General Appearance: Disheveled  Eye Contact:Minimal  Speech:Pressured  Speech Volume:Increased  Handedness:Right   Mood and Affect  Mood:Angry;  Dysphoric  Affect:Labile   Thought Process  Thought Processes:Disorganized  Descriptions of Associations:Loose  Orientation:None  Thought Content:Delusions; Paranoid Ideation; Rumination  History of Schizophrenia/Schizoaffective disorder:Yes  Duration of Psychotic Symptoms:Greater than six months  Hallucinations:Hallucinations: Auditory  Ideas of Reference:Delusions; Paranoia; Other (comment)  Suicidal Thoughts:Suicidal Thoughts: No  Homicidal Thoughts:Homicidal Thoughts: No   Sensorium  Memory:Immediate Poor; Recent Poor; Remote Poor  Judgment:Impaired  Insight:Lacking   Executive Functions  Concentration:Poor  Attention Span:Poor  Recall:Poor  Fund of Knowledge:Poor  Language:Good   Psychomotor Activity  Psychomotor Activity:Psychomotor Activity: Increased   Assets  Assets:Desire for Improvement; Resilience; Social Support   Sleep  Sleep:Sleep: Good Number of Hours of Sleep: 6.25    Physical Exam: Physical Exam Vitals and nursing note reviewed.  HENT:     Head: Normocephalic and atraumatic.  Pulmonary:     Effort: Pulmonary effort is normal.  Neurological:     General: No focal deficit present.     Mental Status: She is alert.    ROS unknown if currently breastfeeding. There is no height or weight on file to calculate BMI.   Treatment Plan Summary: Daily contact with patient to assess and evaluate symptoms and progress in treatment, Medication management and Plan : Patient is seen and examined.  Patient is a 30 year old female with the above-stated past psychiatric history who is seen in follow-up.  Diagnosis: 1.  Schizophrenia 2.  Mild tachycardia. 3.  Hypertension  Pertinent findings on examination today: 1.  Patient remains significantly psychotic, agitated and labile.  Plan: 1.  Increase Risperdal to 3 mg p.o. twice daily for psychosis. 2.  Continue Risperdal agitation protocol including Geodon. 3.  Haldol 10 mg/Ativan  2 mg/Benadryl 25 mg IM x1 now secondary to agitation. 4.  Patient may require forced medication protocol. 5.  Monitor blood pressure. 6.  Disposition planning-in progress.  Antonieta Pert, MD 09/14/2020, 10:13 AM

## 2020-09-14 NOTE — [facility] Group Notes (Signed)
Adult Psychoeducational Group Not Date:  09/14/2020 Time:  0900-1045 Group Topic/Focus: PROGRESSIVE RELAXATION. A group where deep breathing is taught and tensing and relaxation muscle groups is used. Imagery is used as well.  Pts are asked to imagine 3 pillars that hold them up when they are not able to hold themselves up.  Participation Level:  Did not attend  Danielle Lento A    

## 2020-09-14 NOTE — [facility] Counselor (Signed)
Clinical Social Work Note  Both Saturday 3/26 and Sunday 3/27, patient could not do the Psychosocial Assessment due to her psychosis and disorganization.  CSW team will continue to follow up.  Ambrose Mantle, LCSW 09/14/2020, 1:34 PM

## 2020-09-14 NOTE — Progress Notes (Addendum)
   09/14/20 1300  Psych Admission Type (Psych Patients Only)  Admission Status Involuntary  Psychosocial Assessment  Patient Complaints Other (Comment) (grief)  Eye Contact Glaring;Intense  Facial Expression Flat  Affect Preoccupied  Speech Slow;Elective mutism  Interaction Guarded;Hypervigilant  Motor Activity Slow  Appearance/Hygiene Body odor;In hospital gown  Behavior Characteristics Guarded;Anxious  Mood Suspicious;Anxious  Aggressive Behavior  Targets Other (Comment) (staff in ED)  Type of Behavior Striking out;Unprovoked  Effect No apparent injury  Thought Administrator, sports thinking  Content Preoccupation;Religiosity  Delusions Religious  Perception Hallucinations  Hallucination Auditory  Judgment Impaired  Confusion Moderate  Danger to Self  Current suicidal ideation? Denies  Danger to Others  Danger to Others Reported or observed (Reported by RN at [facility])

## 2020-09-14 NOTE — Progress Notes (Signed)
   09/13/20 2040  COVID-19 Daily Checkoff  Have you had a fever (temp > 37.80C/100F)  in the past 24 hours?  No  If you have had runny nose, nasal congestion, sneezing in the past 24 hours, has it worsened? No  COVID-19 EXPOSURE  Have you traveled outside the state in the past 14 days? No  Have you been in contact with someone with a confirmed diagnosis of COVID-19 or PUI in the past 14 days without wearing appropriate PPE? No  Have you been living in the same home as a person with confirmed diagnosis of COVID-19 or a PUI (household contact)? No  Have you been diagnosed with COVID-19? No

## 2020-09-14 NOTE — [facility] Group Notes (Signed)
[facility] Group Notes: (Clinical Social Work)   09/14/2020      Type of Therapy:  Group Therapy   Participation Level:  Did Not Attend - was invited both individually by [facility] and by overhead announcement, chose not to attend.   Ambrose Mantle, LCSW 09/14/2020, 1:30 PM

## 2020-09-14 NOTE — Progress Notes (Addendum)
Patient much calmer and clearer this evening. Pt slept throughout the afternoon after receiving injection for agitation.  Pt presented more lucid after waking up,  asking to use the phone to call her work to let them know she won't be at work. Pt willingly took her Risperdal disintegrating tablet without hesitation and went back to bed.

## 2020-09-14 NOTE — Progress Notes (Signed)
Adult Psychoeducational Group Note  Date:  09/14/2020 Time:  10:57 PM  Group Topic/Focus:  Wrap-Up Group:   The focus of this group is to help patients review their daily goal of treatment and discuss progress on daily workbooks.  Participation Level:  Did Not Attend  Participation Quality:  Did Not Attend  Affect:  Did Not Attend  Cognitive:  Did Not Attend  Insight: None  Engagement in Group:  Did Not Attend  Modes of Intervention:  Did Not Attend  Additional Comments:  Pt did not attend wrap up group tonight.  Felipa Furnace 09/14/2020, 10:57 PM

## 2020-09-15 DIAGNOSIS — F200 Paranoid schizophrenia: Secondary | ICD-10-CM | POA: Diagnosis not present

## 2020-09-15 LAB — PROLACTIN: Prolactin: 13 ng/mL (ref 4.8–23.3)

## 2020-09-15 MED ORDER — LORAZEPAM 1 MG PO TABS
2.0000 mg | ORAL_TABLET | Freq: Four times a day (QID) | ORAL | Status: DC | PRN
  Administered 2020-09-15 – 2020-09-16 (×2): 2 mg via ORAL
  Filled 2020-09-15 (×2): qty 2

## 2020-09-15 MED ORDER — RISPERIDONE 2 MG PO TBDP: 2.0000 mg | ORAL_TABLET | Freq: Three times a day (TID) | ORAL | Status: DC | PRN

## 2020-09-15 MED ORDER — RISPERIDONE 3 MG PO TBDP
3.0000 mg | ORAL_TABLET | Freq: Every day | ORAL | Status: DC
  Administered 2020-09-16: 3 mg via ORAL
  Filled 2020-09-15 (×2): qty 1

## 2020-09-15 MED ORDER — ZIPRASIDONE MESYLATE 20 MG IM SOLR: 20.0000 mg | Freq: Four times a day (QID) | INTRAMUSCULAR | Status: DC | PRN

## 2020-09-15 MED ORDER — RISPERIDONE 2 MG PO TBDP
4.0000 mg | ORAL_TABLET | Freq: Every day | ORAL | Status: DC
  Administered 2020-09-15: 4 mg via ORAL
  Filled 2020-09-15 (×2): qty 2

## 2020-09-15 MED ORDER — CARBAMAZEPINE 100 MG PO CHEW
200.0000 mg | CHEWABLE_TABLET | Freq: Two times a day (BID) | ORAL | Status: DC
  Administered 2020-09-16: 200 mg via ORAL
  Filled 2020-09-15 (×5): qty 2

## 2020-09-15 MED ORDER — RISPERIDONE MICROSPHERES ER 25 MG IM SRER
25.0000 mg | INTRAMUSCULAR | Status: DC
  Administered 2020-09-15: 25 mg via INTRAMUSCULAR
  Filled 2020-09-15: qty 2

## 2020-09-15 NOTE — Progress Notes (Signed)
[facility] Second Physician Opinion Progress Note for Medication Administration to Non-consenting Patients (For Involuntarily Committed Patients)  Patient: Lindsay [name] Date of Birth: [birth year] MRN: [id number]   Patient seen at the request of attending physician Dr. Landry Mellow.  Medical record reviewed.  Patient is a 30 year old female with a history of schizophrenia admitted with thoughts of killing others, delusions and increased irritability in the context of noncompliance with medications.  Since her admission the patient has continued to present as psychotic with symptoms of lability, easy anger, pacing, suspiciousness and hyper-religiosity, etc.  On interview with me today, the patient is guarded, somewhat disorganized and paranoid and lacks insight into her mental illness and the need for treatment.  When asked why she is here, the patient states that she was admitted because "I was used as an opportunity to see how people can function with certain medications."  I asked her if she believes she has a psychiatric diagnosis or mental health diagnosis and the patient replies that she does not have a mental illness or condition.  She states that she is fine without medications and does not need medications.  The patient lacks insight into her psychiatric condition and the need for medications.  She continues to be too symptomatic to participate meaningfully in decisions regarding her treatment, including decisions regarding medications and whether medications are necessary to treat her symptoms.  Reason for the Medication: The patient, without the benefit of the specific treatment measure, is incapable of participating in any available treatment plan that will give the patient a realistic opportunity of improving the patient's condition. There is, without the benefit of the specific treatment measure, a significant possibility that the patient will harm self or others before improvement of the patient's condition  is realized.  Consideration of Side Effects: Consideration of the side effects related to the medication plan has been given.  Rationale for Medication Administration: Without medications, it is highly likely that patient will continue to be delusional, paranoid, labile, agitated and will have increased duration of symptoms, increased period of hospitalization and increased disability.  Without adequate treatment it is possible that her symptoms will result in physical harm to others.    Claudie Revering, MD 09/15/20  3:01 PM   This documentation is good for (7) seven days from the date of the MD signature. New documentation must be completed every seven (7) days with detailed justification in the medical record if the patient requires continued non-emergent administration of psychotropic medications.

## 2020-09-15 NOTE — Progress Notes (Signed)
   09/15/20 0100  Psych Admission Type (Psych Patients Only)  Admission Status Involuntary  Psychosocial Assessment  Patient Complaints Anxiety;Irritability;Suspiciousness  Scientist, research (life sciences);Intense  Facial Expression Flat  Affect Preoccupied  Speech Slow;Elective mutism  Interaction Guarded;Hypervigilant  Motor Activity Slow  Appearance/Hygiene Body odor;In hospital gown  Behavior Characteristics Agitated;Anxious  Mood Anxious;Suspicious  Aggressive Behavior  Targets Other (Comment) (staff in ED)  Type of Behavior Striking out;Unprovoked  Effect No apparent injury  Thought Administrator, sports thinking  Content Preoccupation;Religiosity  Delusions Religious  Perception Hallucinations  Hallucination Auditory  Judgment Impaired  Confusion Moderate  Danger to Self  Current suicidal ideation? Denies  Danger to Others  Danger to Others Reported or observed (Reported by RN at [facility])  Patient woke up agitated Prn Risperdal 2mg  given and not effective Prn ativan 1mg  PO given at 115 am. Prn medication effective by 0200. Patient in bed sleeping respirations noted no S/S of distress. Q15 minutes safety checks ongoing. Patient remains safe at this time.

## 2020-09-15 NOTE — Progress Notes (Addendum)
[facility] MD Progress Note  09/16/2020 2:04 PM Lindsay [name]  MRN:  [id number]   Chief Complaint: psychosis  Subjective:  Lindsay [name] is a 30 y.o. female with a past psychiatric history significant for schizophrenia who was admitted on 09/13/2020 for management of delusions, irritability, and paranoia in the context of medication noncompliance. The patient is currently on Hospital Day 4.   Chart Review from last 24 hours:  The patient's chart was reviewed and nursing notes were reviewed. The patient's case was discussed in multidisciplinary team meeting. Per [facility] patient refused Tegretol yesterday. She required Ativan X1 po yesterday for agitation.   Information Obtained Today During Patient Interview: The patient was seen and evaluated on the unit. On assessment today the patient reports that she showered this morning and is feeling better after having slept overnight. She states her appetite and sleep are both improving and she voices no physical complaints today. She states her AH have resolved after sleep last night and she denies VH. She denies first rank symptoms, ideas of reference, SI, HI, or paranoia. She states she is followed at [facility] and is unsure of her diagnosis. She admits to medication noncompliance prior to admission. She denies any recent drug use but states she did drink some wine "a few weeks" prior to admission. She denies current cravings or withdrawal symptoms. She states she is willing to take her Risperdal but does not wish to continue the Tegretol despite discussion of r/b/se/a to medication.   Principal Problem: Schizophrenia spectrum disorder with psychotic disorder type not yet determined (HCC) Diagnosis: Principal Problem:   Schizophrenia spectrum disorder with psychotic disorder type not yet determined (HCC)  Total Time Spent in Direct Patient Care:  I personally spent 30 minutes on the unit in direct patient care. The direct patient care time included face-to-face  time with the patient, reviewing the patient's chart, communicating with other professionals, and coordinating care. Greater than 50% of this time was spent in counseling or coordinating care with the patient regarding goals of hospitalization, psycho-education, and discharge planning needs.  Past Psychiatric History: see admission H&P  Past Medical History:  Past Medical History:  Diagnosis Date  . Anemia   . Asthma   . Homozygous alpha thalassemia (HCC)   . Hx of chlamydia infection   . Hx of scoliosis   . Pregnancy induced hypertension   . Schizophrenia [facility])     Past Surgical History:  Procedure Laterality Date  . CESAREAN SECTION N/A 01/25/2019   Procedure: CESAREAN SECTION;  Surgeon: Jaymes Graff, MD;  Location: [facility] LD [facility]S;  Service: Obstetrics;  Laterality: N/A;  . TONSILLECTOMY     Family History:  Family History  Problem Relation Age of Onset  . Cancer Father        liver  . Hypertension Mother   . Diabetes Maternal Grandmother   . Cancer Maternal Grandmother    Family Psychiatric  History: see admission H&P  Social History:  Social History   Substance and Sexual Activity  Alcohol Use No     Social History   Substance and Sexual Activity  Drug Use No    Social History   Socioeconomic History  . Marital status: Single    Spouse name: Not on file  . Number of children: Not on file  . Years of education: Not on file  . Highest education level: Not on file  Occupational History  . Not on file  Tobacco Use  . Smoking status: Never Smoker  .  Smokeless tobacco: Never Used  Vaping Use  . Vaping Use: Never used  Substance and Sexual Activity  . Alcohol use: No  . Drug use: No  . Sexual activity: Yes    Birth control/protection: None  Other Topics Concern  . Not on file  Social History Narrative  . Not on file   Social Determinants of Health   Financial Resource Strain: Not on file  Food Insecurity: Not on file  Transportation Needs: Not on file   Physical Activity: Not on file  Stress: Not on file  Social Connections: Not on file   Sleep: Good  Appetite:  Good  Current Medications: Current Facility-Administered Medications  Medication Dose Route Frequency Provider Last Rate Last Admin  . acetaminophen (TYLENOL) tablet 650 mg  650 mg Oral Q6H PRN Jaclyn Shaggyaylor, Cody W, PA-C      . alum & mag hydroxide-simeth (MAALOX/MYLANTA) 200-200-20 MG/5ML suspension 30 mL  30 mL Oral Q4H PRN Melbourne Abtsaylor, Cody W, PA-C      . benztropine (COGENTIN) tablet 0.5 mg  0.5 mg Oral BID Mason JimSingleton, Mikaylah Libbey E, MD      . risperiDONE (RISPERDAL M-TABS) disintegrating tablet 2 mg  2 mg Oral Q8H PRN Antonieta Pertlary, Greg Lawson, MD       And  . LORazepam (ATIVAN) tablet 2 mg  2 mg Oral Q6H PRN Antonieta Pertlary, Greg Lawson, MD   2 mg at 09/15/20 2048   And  . ziprasidone (GEODON) injection 20 mg  20 mg Intramuscular Q6H PRN Antonieta Pertlary, Greg Lawson, MD      . magnesium hydroxide (MILK OF MAGNESIA) suspension 30 mL  30 mL Oral Daily PRN Melbourne Abtsaylor, Cody W, PA-C      . risperiDONE (RISPERDAL M-TABS) disintegrating tablet 3 mg  3 mg Oral BID Mason JimSingleton, Caidence Higashi E, MD      . risperiDONE microspheres (RISPERDAL CONSTA) injection 25 mg  25 mg Intramuscular Q14 Days Antonieta Pertlary, Greg Lawson, MD   25 mg at 09/15/20 1831  . white petrolatum (VASELINE) gel             Lab Results:  No results found for this or any previous visit (from the past 48 hour(s)).  Blood Alcohol level:  Lab Results  Component Value Date   ETH <10 04/08/2019   ETH <10 10/18/2017    Metabolic Disorder Labs: Lab Results  Component Value Date   HGBA1C 5.9 (H) 09/13/2020   MPG 122.63 09/13/2020   MPG 119.76 04/11/2019   Lab Results  Component Value Date   PROLACTIN 13.0 09/13/2020   PROLACTIN 219.0 (H) 04/11/2019   Lab Results  Component Value Date   CHOL 180 09/13/2020   TRIG 79 09/13/2020   HDL 50 09/13/2020   CHOLHDL 3.6 09/13/2020   VLDL 16 09/13/2020   LDLCALC 114 (H) 09/13/2020    Physical Findings: AIMS: Facial and  Oral Movements Muscles of Facial Expression: None, normal Lips and Perioral Area: None, normal Jaw: None, normal Tongue: None, normal,Extremity Movements Upper (arms, wrists, hands, fingers): None, normal Lower (legs, knees, ankles, toes): None, normal, Trunk Movements Neck, shoulders, hips: None, normal, Overall Severity Severity of abnormal movements (highest score from questions above): None, normal Incapacitation due to abnormal movements: None, normal Patient's awareness of abnormal movements (rate only patient's report): No Awareness, Dental Status Current problems with teeth and/or dentures?: No Does patient usually wear dentures?: No      Musculoskeletal: Strength & Muscle Tone: within normal limits Gait & Station: normal, steady Patient leans: N/A  Psychiatric  Specialty Exam: Physical Exam Vitals reviewed.  HENT:     Head: Normocephalic.  Pulmonary:     Effort: Pulmonary effort is normal.  Neurological:     Mental Status: She is alert.     Review of Systems  Respiratory: Negative for shortness of breath.   Cardiovascular: Negative for chest pain.  Gastrointestinal: Negative for nausea and vomiting.    Blood pressure 140/81, pulse 100, temperature 98 F (36.7 C), temperature source Oral, resp. rate 18, unknown if currently breastfeeding.There is no height or weight on file to calculate BMI.  General Appearance: improved hygiene, wearing hospital gown, well engaged with examiner  Eye Contact:  Good  Speech:  Clear and Coherent and Normal Rate  Volume:  Normal  Mood:  described as good - appears calm and euthymic  Affect:  moderate, stable  Thought Process:  Goal Directed  Orientation:  Full (Time, Place, and Person)  Thought Content:  Logical and Denies AVH, paranoia, delusions, or ideas of reference; no acute psychosis noted on exam  Suicidal Thoughts:  Denied  Homicidal Thoughts:  Denied  Memory:  Poor  Judgement:  Fair  Insight:  Fair  Psychomotor  Activity:  Normal, no cogwheeling but has mild stiffness with activation in right wrist; no tremors  Concentration:  Concentration: Fair and Attention Span: Fair  Recall:  Poor  Fund of Knowledge:  Fair  Language:  Good  Akathisia:  Negative  AIMS (if indicated):   0  Assets:  Communication Skills Desire for Improvement Resilience  ADL's:  Intact  Cognition:  WNL  Sleep:  Number of Hours: 6.25   Treatment Plan Summary: Diagnoses / Active Problems: Unspecified schizophrenia spectrum and other psychotic d/o (r/o schizoaffective d/o, r/o schizophrenia)  PLAN: 1. Safety and Monitoring:  -- Involuntary admission to inpatient psychiatric unit for safety, stabilization and treatment  -- Daily contact with patient to assess and evaluate symptoms and progress in treatment  -- Patient's case to be discussed in multi-disciplinary team meeting  -- Observation Level : q15 minute checks  -- Vital signs:  q12 hours  -- Precautions: suicide  2. Psychiatric Diagnoses and Treatment:   Unspecified schizophrenia spectrum and other psychotic d/o (r/o schizoaffective d/o, r/o schizophrenia)  -- Attempting to get collateral from [facility] about her diagnosis and medication trials -- Due to clinical improvement today, recent loading of Risperdal Consta 25mg  IM, along with mild wrist cogwheeling, will reduce Risperdal to 3mg  po bid as bridging dose while consta becomes effective - patient currently compliant with oral medication -- Patient refuses po Tegretol and dose discontinued -- Start Cogentin 0.5mg  bid - r/b/se/a to med discussed with patient and she consents to med trial -- Continue Risperdal po, Geodon IM, and Ativan IM agitation protocol  -- Metabolic profile and EKG monitoring obtained while on an atypical antipsychotic ( Lipid Panel:cholesterol 180, triglycerides 79, HDL 50, LDL 114; HbgA1c: 5.9; QTc:421ms) Repeating EKG for QTc monitoring with dose change in antipsychotic  -- Encouraged patient  to participate in unit milieu and in scheduled group therapies   -- Short Term Goals: Ability to identify changes in lifestyle to reduce recurrence of condition will improve, Ability to identify and develop effective coping behaviors will improve and Compliance with prescribed medications will improve  -- Long Term Goals: Improvement in symptoms so as ready for discharge  3. Medical Issues Being Addressed:   CO2 18 and total protein 8.3 on admission   -- Repeating CMP for monitoring and encouraging improved po intake  WBC 14.7 on admission  -- Repeating CBC for trending  4. Discharge Planning:   -- Social work and case management to assist with discharge planning and identification of hospital follow-up needs prior to discharge  -- Estimated LOS: 3-4 days  -- Discharge Concerns: Need to establish a safety plan; Medication compliance and effectiveness  -- Discharge Goals: Return home with outpatient referrals for mental health follow-up including medication management/psychotherapy  Comer Locket, MD, FAPA 09/16/2020, 2:04 PM

## 2020-09-15 NOTE — Progress Notes (Signed)
[facility] MD Progress Note  09/15/2020 11:37 AM Lindsay [name]  MRN:  [id number] Subjective:  Patient is a 30 year old female with a past psychiatric history significant for schizophrenia who was admitted on 09/13/2020 secondary to noncompliance with medications, and stated that the patient had homicidal ideation and increased irritability.  She also had delusions.  Objective: Patient is seen and examined.  Patient is a 30 year old female with the above-stated past psychiatric history who is seen in follow-up.  She still remains significantly psychotic.  She has periods of lability and will become angry, other times she is down the hallway pacing and singing out loud.  Review of the nursing notes from last night revealed that she was calmer and clearer in the evening.  She did take her Risperdal last night.  At approximately 1:30 AM she was noted to be anxious, irritable and suspicious.  She appeared to be preoccupied and guarded.  She did have some striking out behaviors.  Been an additional 2 mg of Risperdal and Ativan and finally was able to calm and go to sleep.  This morning she remains hyper religious, suspicious and paranoid.  She wanted to know if I was Jesus.  They have been unable to get vital signs this morning.  It was noted that she slept 6.25 hours last night.  No new laboratories.  Principal Problem: <principal problem not specified> Diagnosis: Active Problems:   Schizophrenia (HCC)  Total Time spent with patient: 20 minutes  Past Psychiatric History: See admission H&P  Past Medical History:  Past Medical History:  Diagnosis Date  . Anemia   . Asthma   . Homozygous alpha thalassemia (HCC)   . Hx of chlamydia infection   . Hx of scoliosis   . Pregnancy induced hypertension   . Schizophrenia [facility])     Past Surgical History:  Procedure Laterality Date  . CESAREAN SECTION N/A 01/25/2019   Procedure: CESAREAN SECTION;  Surgeon: Jaymes Graff, MD;  Location: [facility] LD [facility]S;  Service:  Obstetrics;  Laterality: N/A;  . TONSILLECTOMY     Family History:  Family History  Problem Relation Age of Onset  . Cancer Father        liver  . Hypertension Mother   . Diabetes Maternal Grandmother   . Cancer Maternal Grandmother    Family Psychiatric  History: See admission H&P Social History:  Social History   Substance and Sexual Activity  Alcohol Use No     Social History   Substance and Sexual Activity  Drug Use No    Social History   Socioeconomic History  . Marital status: Single    Spouse name: Not on file  . Number of children: Not on file  . Years of education: Not on file  . Highest education level: Not on file  Occupational History  . Not on file  Tobacco Use  . Smoking status: Never Smoker  . Smokeless tobacco: Never Used  Vaping Use  . Vaping Use: Never used  Substance and Sexual Activity  . Alcohol use: No  . Drug use: No  . Sexual activity: Yes    Birth control/protection: None  Other Topics Concern  . Not on file  Social History Narrative  . Not on file   Social Determinants of Health   Financial Resource Strain: Not on file  Food Insecurity: Not on file  Transportation Needs: Not on file  Physical Activity: Not on file  Stress: Not on file  Social Connections: Not on file  Additional Social History:                         Sleep: Fair  Appetite:  Fair  Current Medications: Current Facility-Administered Medications  Medication Dose Route Frequency Provider Last Rate Last Admin  . acetaminophen (TYLENOL) tablet 650 mg  650 mg Oral Q6H PRN Jaclyn Shaggy, PA-C      . alum & mag hydroxide-simeth (MAALOX/MYLANTA) 200-200-20 MG/5ML suspension 30 mL  30 mL Oral Q4H PRN Melbourne Abts W, PA-C      . carbamazepine (TEGRETOL) chewable tablet 200 mg  200 mg Oral BID Antonieta Pert, MD      . magnesium hydroxide (MILK OF MAGNESIA) suspension 30 mL  30 mL Oral Daily PRN Melbourne Abts W, PA-C      . risperiDONE (RISPERDAL  M-TABS) disintegrating tablet 2 mg  2 mg Oral Q8H PRN Jaclyn Shaggy, PA-C   2 mg at 09/15/20 0010  . risperidone (RISPERDAL M-TABS) disintegrating tablet 3 mg  3 mg Oral BID Antonieta Pert, MD   3 mg at 09/15/20 1884  . ziprasidone (GEODON) injection 20 mg  20 mg Intramuscular Q6H PRN Antonieta Pert, MD        Lab Results:  Results for orders placed or performed during the hospital encounter of 09/12/20 (from the past 48 hour(s))  Lipid panel     Status: Abnormal   Collection Time: 09/13/20  6:31 PM  Result Value Ref Range   Cholesterol 180 0 - 200 mg/dL   Triglycerides 79 <166 mg/dL   HDL 50 >06 mg/dL   Total CHOL/HDL Ratio 3.6 RATIO   VLDL 16 0 - 40 mg/dL   LDL Cholesterol 301 (H) 0 - 99 mg/dL    Comment:        Total Cholesterol/HDL:CHD Risk Coronary Heart Disease Risk Table                     Men   Women  1/2 Average Risk   3.4   3.3  Average Risk       5.0   4.4  2 X Average Risk   9.6   7.1  3 X Average Risk  23.4   11.0        Use the calculated Patient Ratio above and the CHD Risk Table to determine the patient's CHD Risk.        ATP III CLASSIFICATION (LDL):  <100     mg/dL   Optimal  601-093  mg/dL   Near or Above                    Optimal  130-159  mg/dL   Borderline  235-573  mg/dL   High  >220     mg/dL   Very High Performed at [facility], [address], [place], [place] [address]   Prolactin     Status: None   Collection Time: 09/13/20  6:31 PM  Result Value Ref Range   Prolactin 13.0 4.8 - 23.3 ng/mL    Comment: (NOTE) Performed At: [facility] [address] [place], [place] [id number] Jolene Schimke MD TD:[id number]   TSH     Status: None   Collection Time: 09/13/20  6:31 PM  Result Value Ref Range   TSH 1.357 0.350 - 4.500 uIU/mL    Comment: Performed by a 3rd Generation assay with a functional sensitivity of <=0.01 uIU/mL.  Performed at [facility], [address] [place], [place]  [id number]   Hemoglobin A1c     Status: Abnormal   Collection Time: 09/13/20  6:31 PM  Result Value Ref Range   Hgb A1c MFr Bld 5.9 (H) 4.8 - 5.6 %    Comment: (NOTE) Pre diabetes:          5.7%-6.4%  Diabetes:              >6.4%  Glycemic control for   <7.0% adults with diabetes    Mean Plasma Glucose 122.63 mg/dL    Comment: Performed at [facility], [address] [place], [place] [address]    Blood Alcohol level:  Lab Results  Component Value Date   [facility] <[facility] 04/08/2019   ETH <10 10/18/2017    Metabolic Disorder Labs: Lab Results  Component Value Date   HGBA1C 5.9 (H) 09/13/2020   MPG 122.63 09/13/2020   MPG 119.76 04/11/2019   Lab Results  Component Value Date   PROLACTIN 13.0 09/13/2020   PROLACTIN 219.0 (H) 04/11/2019   Lab Results  Component Value Date   CHOL 180 09/13/2020   TRIG 79 09/13/2020   HDL 50 09/13/2020   CHOLHDL 3.6 09/13/2020   VLDL 16 09/13/2020   LDLCALC 114 (H) 09/13/2020    Physical Findings: AIMS: Facial and Oral Movements Muscles of Facial Expression: None, normal Lips and Perioral Area: None, normal Jaw: None, normal Tongue: None, normal,Extremity Movements Upper (arms, wrists, hands, fingers): None, normal Lower (legs, knees, ankles, toes): None, normal, Trunk Movements Neck, shoulders, hips: None, normal, Overall Severity Severity of abnormal movements (highest score from questions above): None, normal Incapacitation due to abnormal movements: None, normal Patient's awareness of abnormal movements (rate only patient's report): No Awareness, Dental Status Current problems with teeth and/or dentures?: No Does patient usually wear dentures?: No  CIWA:    COWS:     Musculoskeletal: Strength & Muscle Tone: within normal limits Gait & Station: normal Patient leans: N/A  Psychiatric Specialty Exam:  Presentation  General Appearance: Disheveled  Eye Contact:Fair  Speech:Normal Rate  Speech  Volume:Normal  Handedness:Right   Mood and Affect  Mood:Dysphoric; Irritable; Labile  Affect:Labile   Thought Process  Thought Processes:Disorganized  Descriptions of Associations:Loose  Orientation:Partial  Thought Content:Delusions; Paranoid Ideation; Tangential  History of Schizophrenia/Schizoaffective disorder:Yes  Duration of Psychotic Symptoms:Greater than six months  Hallucinations:Hallucinations: Auditory  Ideas of Reference:Delusions; Paranoia  Suicidal Thoughts:Suicidal Thoughts: No  Homicidal Thoughts:Homicidal Thoughts: No   Sensorium  Memory:Immediate Poor; Recent Poor; Remote Poor  Judgment:Impaired  Insight:Lacking   Executive Functions  Concentration:Poor  Attention Span:Poor  Recall:Poor  Fund of Knowledge:Poor  Language:Fair   Psychomotor Activity  Psychomotor Activity:Psychomotor Activity: Increased   Assets  Assets:Desire for Improvement; Resilience; Social Support   Sleep  Sleep:Sleep: Fair Number of Hours of Sleep: 6.25    Physical Exam: Physical Exam Vitals and nursing note reviewed.  HENT:     Head: Normocephalic and atraumatic.  Pulmonary:     Effort: Pulmonary effort is normal.  Neurological:     General: No focal deficit present.     Mental Status: She is alert.    ROS unknown if currently breastfeeding. There is no height or weight on file to calculate BMI.   Treatment Plan Summary: Daily contact with patient to assess and evaluate symptoms and progress in treatment, Medication management and Plan : Patient is seen and examined.  Patient is a 30 year old female with the  above-stated past psychiatric history who is seen in follow-up.  Diagnosis: 1.  Schizophrenia 2.  Mild tachycardia 3.  Hypertension  Pertinent findings on examination today: 1.  Patient remains significantly delusional, psychotic, paranoid.  Plan: 1.  Increase Risperdal to 3 mg p.o. daily and 4 mg p.o. nightly for psychosis. 2.   On previous psychiatric hospitalization in 2020 she was also placed on Tegretol 200 mg p.o. every morning and 400 mg p.o. nightly.  We will restart that at 100 mg p.o. twice daily and titrate that for mood stability. 3.  We will need to get accurate vital signs on the patient given her history of hypertension and tachycardia. 4.  Continue Risperdal agitation protocol as needed. 5.  Continue Geodon 20 mg IM every 6 hours as needed agitation. 6.  Disposition planning-in progress.   Antonieta Pert, MD 09/15/2020, 11:37 AM

## 2020-09-15 NOTE — Tx Team (Signed)
Interdisciplinary Treatment and Diagnostic Plan Update  09/15/2020 Time of Session:  Lindsay [name] MRN: [id number]  Principal Diagnosis: <principal problem not specified>  Secondary Diagnoses: Active Problems:   Schizophrenia ([place])   Current Medications:  Current Facility-Administered Medications  Medication Dose Route Frequency Provider Last Rate Last Admin  . acetaminophen (TYLENOL) tablet 650 mg  650 mg Oral Q6H PRN Prescilla Sours, PA-C      . alum & mag hydroxide-simeth (MAALOX/MYLANTA) 200-200-20 MG/5ML suspension 30 mL  30 mL Oral Q4H PRN Margorie John W, PA-C      . carbamazepine (TEGRETOL) chewable tablet 200 mg  200 mg Oral BID Sharma Covert, MD      . risperiDONE (RISPERDAL M-TABS) disintegrating tablet 2 mg  2 mg Oral Q8H PRN Sharma Covert, MD       And  . LORazepam (ATIVAN) tablet 2 mg  2 mg Oral Q6H PRN Sharma Covert, MD       And  . ziprasidone (GEODON) injection 20 mg  20 mg Intramuscular Q6H PRN Sharma Covert, MD      . magnesium hydroxide (MILK OF MAGNESIA) suspension 30 mL  30 mL Oral Daily PRN Prescilla Sours, PA-C      . [START ON 09/16/2020] risperiDONE (RISPERDAL M-TABS) disintegrating tablet 3 mg  3 mg Oral Daily Sharma Covert, MD      . risperiDONE (RISPERDAL M-TABS) disintegrating tablet 4 mg  4 mg Oral QHS Mallie Darting Cordie Grice, MD       PTA Medications: Medications Prior to Admission  Medication Sig Dispense Refill Last Dose  . albuterol (VENTOLIN HFA) 108 (90 Base) MCG/ACT inhaler Inhale 2 puffs into the lungs every 6 (six) hours as needed.     . risperiDONE (RISPERDAL) 1 MG tablet Take 1 mg by mouth at bedtime.       Patient Stressors: Other: Pt did not respond to question  Patient Strengths: Physical Health Supportive family/friends  Treatment Modalities: Medication Management, Group therapy, Case management,  1 to 1 session with clinician, Psychoeducation, Recreational therapy.   Physician Treatment Plan for Primary  Diagnosis: <principal problem not specified> Long Term Goal(s): Improvement in symptoms so as ready for discharge   Short Term Goals: Ability to identify changes in lifestyle to reduce recurrence of condition will improve Ability to identify and develop effective coping behaviors will improve Compliance with prescribed medications will improve  Medication Management: Evaluate patient's response, side effects, and tolerance of medication regimen.  Therapeutic Interventions: 1 to 1 sessions, Unit Group sessions and Medication administration.  Evaluation of Outcomes: Not Met  Physician Treatment Plan for Secondary Diagnosis: Active Problems:   Schizophrenia ([place])  Long Term Goal(s): Improvement in symptoms so as ready for discharge   Short Term Goals: Ability to identify changes in lifestyle to reduce recurrence of condition will improve Ability to identify and develop effective coping behaviors will improve Compliance with prescribed medications will improve     Medication Management: Evaluate patient's response, side effects, and tolerance of medication regimen.  Therapeutic Interventions: 1 to 1 sessions, Unit Group sessions and Medication administration.  Evaluation of Outcomes: Not Met   RN Treatment Plan for Primary Diagnosis: <principal problem not specified> Long Term Goal(s): Knowledge of disease and therapeutic regimen to maintain health will improve  Short Term Goals: Ability to participate in decision making will improve, Ability to verbalize feelings will improve and Compliance with prescribed medications will improve  Medication Management: RN will administer medications as ordered  by provider, will assess and evaluate patient's response and provide education to patient for prescribed medication. RN will report any adverse and/or side effects to prescribing provider.  Therapeutic Interventions: 1 on 1 counseling sessions, Psychoeducation, Medication administration,  Evaluate responses to treatment, Monitor vital signs and CBGs as ordered, Perform/monitor CIWA, COWS, AIMS and Fall Risk screenings as ordered, Perform wound care treatments as ordered.  Evaluation of Outcomes: Not Met   LCSW Treatment Plan for Primary Diagnosis: <principal problem not specified> Long Term Goal(s): Safe transition to appropriate next level of care at discharge, Engage patient in therapeutic group addressing interpersonal concerns.  Short Term Goals: Engage patient in aftercare planning with referrals and resources, Increase social support and Increase ability to appropriately verbalize feelings  Therapeutic Interventions: Assess for all discharge needs, 1 to 1 time with Social worker, Explore available resources and support systems, Assess for adequacy in community support network, Educate family and significant other(s) on suicide prevention, Complete Psychosocial Assessment, Interpersonal group therapy.  Evaluation of Outcomes: Not Met   Progress in Treatment: Attending groups: No. Participating in groups: No. Taking medication as prescribed: Yes. Toleration medication: Yes. Family/Significant other contact made: No, will contact:  CSW will obtain consents Patient understands diagnosis: No. Discussing patient identified problems/goals with staff: Yes. Medical problems stabilized or resolved: Yes. Denies suicidal/homicidal ideation: Yes. Issues/concerns per patient self-inventory: No. Other: None  New problem(s) identified: No, Describe:  None  New Short Term/Long Term Goal(s):medication stabilization, elimination of SI thoughts, development of comprehensive mental wellness plan.  Patient Goals:  "to reconnect with family"  Discharge Plan or Barriers: Patient recently admitted. CSW will continue to follow and assess for appropriate referrals and possible discharge planning.  Reason for Continuation of Hospitalization: Delusions  Hallucinations Medication  stabilization  Estimated Length of Stay: 3-5 days  Attendees: Patient: Lindsay [name] 09/15/2020   Physician: Myles Lipps, MD 09/15/2020   Nursing:  09/15/2020  RN Care Manager: 09/15/2020   Social Worker: Verdis Frederickson, [place] 09/15/2020   Recreational Therapist:  09/15/2020   Other:  09/15/2020   Other:  09/15/2020   Other: 09/15/2020      Scribe for Treatment Team: Mliss Fritz, [place] 09/15/2020 2:30 PM

## 2020-09-15 NOTE — BHH Counselor (Signed)
PT could not do the Psychosocial Assessment due to her psychosis and disorganization.  CSW team will continue to follow up.  Fredirick Lathe, LCSWA Clinicial Social Worker [employer]

## 2020-09-15 NOTE — Progress Notes (Signed)
Recreation Therapy Notes  Date: 3.28.22 Time: 1010 Location: [address] Dayroom   Group Topic: Coping Skills    Goal Area(s) Addresses:  Patient will successfully identify what a coping skill is. Patient will successfully identify positive coping skills they can use post d/c.  Patient will successfully identify benefit of using coping skills post d/c. Patient will successfully create a list of coping skills beginning with each letter of the alphabet.   Behavioral Response: Engaged   Intervention: Group work   Activity: Patients were divided into groups of 2-3.  Each group was given a "Coping Skills A-Z" worksheet with a specific personal issue at the top of the page. Partners were instructed to come up with at least one coping skill per letter of the alphabet. Patients were given 25 minutes to discuss with their peers, then discussed as a large group and gave examples of some of their coping skills. Patients and LRT debriefed on the importance of coping skills and how they can be helpful in everyday life.    Education: Pharmacologist, Scientist, physiological, Discharge Planning.    Education Outcome: Acknowledges education/Verbalizes understanding/In group clarification offered/Additional education needed   Clinical Observations/Feedback: Pt was in a group that had to find coping skills for loneliness.  Pt came in late and was assigned to a group.  Pt worked well with peers and they were able to complete the activity.  Some of the coping skills the group came up with were venting, watching television, God, kindness to others and yoyo.  Pt was appropriate and attentive.    Caroll Rancher, LRT/CTRS         Caroll Rancher A 09/15/2020 12:05 PM

## 2020-09-16 DIAGNOSIS — F29 Unspecified psychosis not due to a substance or known physiological condition: Secondary | ICD-10-CM | POA: Diagnosis present

## 2020-09-16 MED ORDER — WHITE PETROLATUM EX OINT
TOPICAL_OINTMENT | CUTANEOUS | Status: AC
  Filled 2020-09-16: qty 10

## 2020-09-16 MED ORDER — BENZTROPINE MESYLATE 0.5 MG PO TABS
0.5000 mg | ORAL_TABLET | Freq: Two times a day (BID) | ORAL | Status: DC
  Administered 2020-09-16 – 2020-09-20 (×9): 0.5 mg via ORAL
  Filled 2020-09-16 (×14): qty 1

## 2020-09-16 MED ORDER — RISPERIDONE 2 MG PO TBDP
3.0000 mg | ORAL_TABLET | Freq: Two times a day (BID) | ORAL | Status: DC
  Administered 2020-09-16 – 2020-09-18 (×4): 3 mg via ORAL
  Filled 2020-09-16 (×7): qty 1

## 2020-09-16 NOTE — Progress Notes (Signed)
Recreation Therapy Notes  Date: 3.29.22 Time: 1005 Location: [address] Dayroom  Group Topic: Self-Esteem  Goal Area(s) Addresses:  Patient will successfully identify positive attributes about themselves.  Patient will successfully identify benefit of improved self-esteem.   Behavioral Response: Engaged  Intervention: Marine scientist, Colored pencils  Activity: Things that Make Me.  Each patient was given a blank sheet of a house.  The house was divided into different sections (foundation, basement, 1st floor, 2nd floor, attic/3rd floor, roof, walls, chimney, smoke, sunshine, yard sign and the sun).  Each section was designated for a specific thing.  The foundation-values that govern your life; wall- people and things that support you; basement-behaviors you are trying to change; door-things you hide from others; 1st floor-emotions you want to experience; 2nd floor-things you are happy about; attic-what a "life worth living" looks like for you; roof-people who protect you; yard sign-things you are proud of; chimney-challenging emotions and triggers you experience; smoke-ways you blow off smoke; and sunshine-what brings you joy.  Education:  Self-Esteem, Discharge Planning  Education Outcome: Acknowledges education/In group clarification offered/Needs additional education  Clinical Observations/Feedback: Pt was active and attentive.  Pt was bright while in group.  Pt completed her sheet with basement-optimistic and endurance; door-fear of being alone; 1st floor-generosity, set boundaries, learn to help her son before anything else; 2nd floor-happy about her son/life and less drugs and alcohol; attic-freedom with family/son and worship God freely; chimney-not feeling constrained; smoke-prayer; sun-God, family, friends and son; walls- mother, brother, friends, family and cousins and yard sign-my son.    Caroll Rancher, LRT/CTRS    Caroll Rancher A 09/16/2020 11:45 AM
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
## 2020-09-16 NOTE — Progress Notes (Signed)
Adult Psychoeducational Group Note  Date:  09/16/2020 Time:  1:08 AM  Group Topic/Focus:  Wrap-Up Group:   The focus of this group is to help patients review their daily goal of treatment and discuss progress on daily workbooks.  Participation Level:  Did Not Attend  Participation Quality:  Did Not Attend  Affect:  Did Not Attend  Cognitive:  Did Not Attend  Insight: None  Engagement in Group:  Did Not Attend  Modes of Intervention:  Did Not Attend  Additional Comments:  Pt did not attend evening wrap up group tonight.  Felipa Furnace 09/16/2020, 1:08 AM

## 2020-09-17 LAB — COMPREHENSIVE METABOLIC PANEL
ALT: 16 U/L (ref 0–44)
AST: 17 U/L (ref 15–41)
Albumin: 3.8 g/dL (ref 3.5–5.0)
Alkaline Phosphatase: 53 U/L (ref 38–126)
Anion gap: 7 (ref 5–15)
BUN: 12 mg/dL (ref 6–20)
CO2: 22 mmol/L (ref 22–32)
Calcium: 9.2 mg/dL (ref 8.9–10.3)
Chloride: 107 mmol/L (ref 98–111)
Creatinine, Ser: 1.11 mg/dL — ABNORMAL HIGH (ref 0.44–1.00)
GFR, Estimated: >60 mL/min (ref >60)
Glucose, Bld: 99 mg/dL (ref 70–99)
Potassium: 4 mmol/L (ref 3.5–5.1)
Sodium: 136 mmol/L (ref 135–145)
Total Bilirubin: 0.6 mg/dL (ref 0.3–1.2)
Total Protein: 7.1 g/dL (ref 6.5–8.1)

## 2020-09-17 LAB — CBC WITH DIFFERENTIAL/PLATELET
Abs Immature Granulocytes: 0.07 10*3/uL (ref 0.00–0.07)
Basophils Absolute: 0 10*3/uL (ref 0.0–0.1)
Basophils Relative: 1 %
Eosinophils Absolute: 0.1 10*3/uL (ref 0.0–0.5)
Eosinophils Relative: 2 %
HCT: 40.7 % (ref 36.0–46.0)
Hemoglobin: 12.5 g/dL (ref 12.0–15.0)
Immature Granulocytes: 1 %
Lymphocytes Relative: 18 %
Lymphs Abs: 1.2 10*3/uL (ref 0.7–4.0)
MCH: 21.6 pg — ABNORMAL LOW (ref 26.0–34.0)
MCHC: 30.7 g/dL (ref 30.0–36.0)
MCV: 70.3 fL — ABNORMAL LOW (ref 80.0–100.0)
Monocytes Absolute: 0.5 10*3/uL (ref 0.1–1.0)
Monocytes Relative: 8 %
Neutro Abs: 4.6 10*3/uL (ref 1.7–7.7)
Neutrophils Relative %: 70 %
Platelets: 219 10*3/uL (ref 150–400)
RBC: 5.79 MIL/uL — ABNORMAL HIGH (ref 3.87–5.11)
RDW: 15.1 % (ref 11.5–15.5)
WBC: 6.5 10*3/uL (ref 4.0–10.5)
nRBC: 0 % (ref 0.0–0.2)

## 2020-09-17 MED ORDER — LORAZEPAM 1 MG PO TABS: 1.0000 mg | ORAL_TABLET | Freq: Four times a day (QID) | ORAL | Status: DC | PRN

## 2020-09-17 MED ORDER — ZIPRASIDONE MESYLATE 20 MG IM SOLR: 20.0000 mg | Freq: Two times a day (BID) | INTRAMUSCULAR | Status: DC | PRN

## 2020-09-17 MED ORDER — WHITE PETROLATUM EX OINT
TOPICAL_OINTMENT | CUTANEOUS | Status: AC
  Filled 2020-09-17: qty 10

## 2020-09-17 MED ORDER — RISPERIDONE 2 MG PO TBDP: 2.0000 mg | ORAL_TABLET | Freq: Three times a day (TID) | ORAL | Status: DC | PRN

## 2020-09-17 MED ORDER — HYDROXYZINE HCL 25 MG PO TABS
25.0000 mg | ORAL_TABLET | Freq: Three times a day (TID) | ORAL | Status: DC | PRN
  Administered 2020-09-17: 25 mg via ORAL
  Filled 2020-09-17: qty 1

## 2020-09-17 MED ORDER — TRAZODONE HCL 50 MG PO TABS
50.0000 mg | ORAL_TABLET | Freq: Every day | ORAL | Status: DC
  Administered 2020-09-17 – 2020-09-19 (×3): 50 mg via ORAL
  Filled 2020-09-17 (×7): qty 1

## 2020-09-17 NOTE — Progress Notes (Addendum)
[facility] MD Progress Note  09/17/2020 1:03 PM Lindsay [name]  MRN:  [id number]   Chief Complaint: psychosis  Subjective:  Lindsay [name] is a 30 y.o. female with a past psychiatric history significant for possible schizophrenia who was admitted on 09/13/2020 for management of delusions, irritability, and paranoia in the context of medication noncompliance. The patient is currently on Hospital Day 5.   Chart Review from last 24 hours:  The patient's chart was reviewed and nursing notes were reviewed. The patient's case was discussed in multidisciplinary team meeting. Per nursing, her mood is improved and she was pleasant and calm on the unit. Per MAR, she was compliant with scheduled medications. She received Ativan 2mg  yesterday evening for anxiety. Slept 6.5 hours.  Information Obtained Today During Patient Interview: The patient was seen and evaluated on the unit. She states she is feeling less paranoid today and denies AVH, first rank symptoms, or ideas of reference. She reports improved sleep and good appetite. She denies medication side-effects. She denies SI or HI. She voices no physical complaints today. We discussed that she is appearing to clear with the Risperdal and she was advised that social work is looking into ACTT for time of discharge.   Principal Problem: Schizophrenia spectrum disorder with psychotic disorder type not yet determined (HCC) Diagnosis: Principal Problem:   Schizophrenia spectrum disorder with psychotic disorder type not yet determined (HCC)  Total Time Spent in Direct Patient Care:  I personally spent 25 minutes on the unit in direct patient care. The direct patient care time included face-to-face time with the patient, reviewing the patient's chart, communicating with other professionals, and coordinating care. Greater than 50% of this time was spent in counseling or coordinating care with the patient regarding goals of hospitalization, psycho-education, and discharge  planning needs.  Past Psychiatric History: see admission H&P  Past Medical History:  Past Medical History:  Diagnosis Date  . Anemia   . Asthma   . Homozygous alpha thalassemia (HCC)   . Hx of chlamydia infection   . Hx of scoliosis   . Pregnancy induced hypertension   . Schizophrenia [facility])     Past Surgical History:  Procedure Laterality Date  . CESAREAN SECTION N/A 01/25/2019   Procedure: CESAREAN SECTION;  Surgeon: 03/27/2019, MD;  Location: [facility] LD ORS;  Service: Obstetrics;  Laterality: N/A;  . TONSILLECTOMY     Family History:  Family History  Problem Relation Age of Onset  . Cancer Father        liver  . Hypertension Mother   . Diabetes Maternal Grandmother   . Cancer Maternal Grandmother    Family Psychiatric  History: see admission H&P  Social History:  Social History   Substance and Sexual Activity  Alcohol Use No     Social History   Substance and Sexual Activity  Drug Use No    Social History   Socioeconomic History  . Marital status: Single    Spouse name: Not on file  . Number of children: Not on file  . Years of education: Not on file  . Highest education level: Not on file  Occupational History  . Not on file  Tobacco Use  . Smoking status: Never Smoker  . Smokeless tobacco: Never Used  Vaping Use  . Vaping Use: Never used  Substance and Sexual Activity  . Alcohol use: No  . Drug use: No  . Sexual activity: Yes    Birth control/protection: None  Other Topics Concern  .  Not on file  Social History Narrative  . Not on file   Social Determinants of Health   Financial Resource Strain: Not on file  Food Insecurity: Not on file  Transportation Needs: Not on file  Physical Activity: Not on file  Stress: Not on file  Social Connections: Not on file   Sleep: Good  Appetite:  Good  Current Medications: Current Facility-Administered Medications  Medication Dose Route Frequency Provider Last Rate Last Admin  . acetaminophen  (TYLENOL) tablet 650 mg  650 mg Oral Q6H PRN Jaclyn Shaggy, PA-C      . alum & mag hydroxide-simeth (MAALOX/MYLANTA) 200-200-20 MG/5ML suspension 30 mL  30 mL Oral Q4H PRN Melbourne Abts W, PA-C      . benztropine (COGENTIN) tablet 0.5 mg  0.5 mg Oral BID Bartholomew Crews E, MD   0.5 mg at 09/17/20 0829  . LORazepam (ATIVAN) tablet 1 mg  1 mg Oral Q6H PRN Mason Jim, Lainie Daubert E, MD       And  . risperiDONE (RISPERDAL M-TABS) disintegrating tablet 2 mg  2 mg Oral Q8H PRN Mason Jim, Adden Strout E, MD       And  . ziprasidone (GEODON) injection 20 mg  20 mg Intramuscular Q12H PRN Mason Jim, Rosi Secrist E, MD      . magnesium hydroxide (MILK OF MAGNESIA) suspension 30 mL  30 mL Oral Daily PRN Melbourne Abts W, PA-C      . risperiDONE (RISPERDAL M-TABS) disintegrating tablet 3 mg  3 mg Oral BID Mason Jim, Sabryn Preslar E, MD   3 mg at 09/17/20 0830  . risperiDONE microspheres (RISPERDAL CONSTA) injection 25 mg  25 mg Intramuscular Q14 Days Antonieta Pert, MD   25 mg at 09/15/20 1831    Lab Results:  Results for orders placed or performed during the hospital encounter of 09/12/20 (from the past 48 hour(s))  CBC with Differential/Platelet     Status: Abnormal   Collection Time: 09/17/20  9:27 AM  Result Value Ref Range   WBC 6.5 4.0 - 10.5 K/uL   RBC 5.79 (H) 3.87 - 5.11 MIL/uL   Hemoglobin 12.5 12.0 - 15.0 g/dL   HCT 62.2 63.3 - 35.4 %   MCV 70.3 (L) 80.0 - 100.0 fL   MCH 21.6 (L) 26.0 - 34.0 pg   MCHC 30.7 30.0 - 36.0 g/dL   RDW 56.2 56.3 - 89.3 %   Platelets 219 150 - 400 K/uL   nRBC 0.0 0.0 - 0.2 %   Neutrophils Relative % 70 %   Neutro Abs 4.6 1.7 - 7.7 K/uL   Lymphocytes Relative 18 %   Lymphs Abs 1.2 0.7 - 4.0 K/uL   Monocytes Relative 8 %   Monocytes Absolute 0.5 0.1 - 1.0 K/uL   Eosinophils Relative 2 %   Eosinophils Absolute 0.1 0.0 - 0.5 K/uL   Basophils Relative 1 %   Basophils Absolute 0.0 0.0 - 0.1 K/uL   Immature Granulocytes 1 %   Abs Immature Granulocytes 0.07 0.00 - 0.07 K/uL    Comment: Performed  at [facility], [address], [place], [place] [address]  Comprehensive metabolic panel     Status: Abnormal   Collection Time: 09/17/20  9:27 AM  Result Value Ref Range   Sodium 136 135 - 145 mmol/L   Potassium 4.0 3.5 - 5.1 mmol/L   Chloride 107 98 - 111 mmol/L   CO2 22 22 - 32 mmol/L   Glucose, Bld 99 70 - 99 mg/dL  Comment: Glucose reference range applies only to samples taken after fasting for at least 8 hours.   BUN 12 6 - 20 mg/dL   Creatinine, Ser 0.35 (H) 0.44 - 1.00 mg/dL   Calcium 9.2 8.9 - 00.9 mg/dL   Total Protein 7.1 6.5 - 8.1 g/dL   Albumin 3.8 3.5 - 5.0 g/dL   AST 17 15 - 41 U/L   ALT 16 0 - 44 U/L   Alkaline Phosphatase 53 38 - 126 U/L   Total Bilirubin 0.6 0.3 - 1.2 mg/dL   GFR, Estimated >38 >18 mL/min    Comment: (NOTE) Calculated using the CKD-EPI Creatinine Equation (2021)    Anion gap 7 5 - 15    Comment: Performed at [facility], [address], [place], [place] [address]    Blood Alcohol level:  Lab Results  Component Value Date   [facility] <10 04/08/2019   ETH <10 10/18/2017    Metabolic Disorder Labs: Lab Results  Component Value Date   HGBA1C 5.9 (H) 09/13/2020   MPG 122.63 09/13/2020   MPG 119.76 04/11/2019   Lab Results  Component Value Date   PROLACTIN 13.0 09/13/2020   PROLACTIN 219.0 (H) 04/11/2019   Lab Results  Component Value Date   CHOL 180 09/13/2020   TRIG 79 09/13/2020   HDL 50 09/13/2020   CHOLHDL 3.6 09/13/2020   VLDL 16 09/13/2020   LDLCALC 114 (H) 09/13/2020    Physical Findings: AIMS: Facial and Oral Movements Muscles of Facial Expression: None, normal Lips and Perioral Area: None, normal Jaw: None, normal Tongue: None, normal,Extremity Movements Upper (arms, wrists, hands, fingers): None, normal Lower (legs, knees, ankles, toes): None, normal, Trunk Movements Neck, shoulders, hips: None, normal, Overall Severity Severity of abnormal movements (highest score from  questions above): None, normal Incapacitation due to abnormal movements: None, normal Patient's awareness of abnormal movements (rate only patient's report): No Awareness, Dental Status Current problems with teeth and/or dentures?: No Does patient usually wear dentures?: No      Musculoskeletal: Strength & Muscle Tone: within normal limits Gait & Station: normal, steady Patient leans: N/A  Psychiatric Specialty Exam: Physical Exam Vitals reviewed.  HENT:     Head: Normocephalic.  Pulmonary:     Effort: Pulmonary effort is normal.  Neurological:     Mental Status: She is alert.     Review of Systems  Respiratory: Negative for shortness of breath.   Cardiovascular: Negative for chest pain.  Gastrointestinal: Negative for nausea and vomiting.    Blood pressure (!) 106/48, pulse (!) 113, temperature 97.6 F (36.4 C), temperature source Oral, resp. rate 18, SpO2 99 %, unknown if currently breastfeeding.There is no height or weight on file to calculate BMI.  General Appearance: adequate hygiene, casually dressed, well engaged with examiner  Eye Contact:  Good  Speech:  Clear and Coherent and Normal Rate  Volume:  Normal  Mood:  described as good - appears calm and euthymic  Affect:  moderate, stable  Thought Process:  Goal Directed  Orientation:  Full (Time, Place, and Person)  Thought Content:  Logical and Denies AVH, paranoia, delusions, or ideas of reference; no acute psychosis noted on exam  Suicidal Thoughts:  Denied  Homicidal Thoughts:  Denied  Memory:  Fair  Judgement:  Fair  Insight:  Fair  Psychomotor Activity:  Normal without evidence of tremor  Concentration:  Concentration: Fair and Attention Span: Fair  Recall: [employer] of Knowledge:  Fair  Language:  Good  Akathisia:  Negative  Assets:  Communication Skills Desire for Improvement Resilience  ADL's:  Intact  Cognition:  WNL  Sleep:  6.5   Treatment Plan Summary: Diagnoses / Active  Problems: Unspecified schizophrenia spectrum and other psychotic d/o (r/o schizoaffective d/o, r/o schizophrenia)  PLAN: 1. Safety and Monitoring:  -- Involuntary admission to inpatient psychiatric unit for safety, stabilization and treatment  -- Daily contact with patient to assess and evaluate symptoms and progress in treatment  -- Patient's case to be discussed in multi-disciplinary team meeting  -- Observation Level : q15 minute checks  -- Vital signs:  q12 hours  -- Precautions: suicide  2. Psychiatric Diagnoses and Treatment:   Unspecified schizophrenia spectrum and other psychotic d/o (r/o schizoaffective d/o, r/o schizophrenia)  -- [facility] her diagnosis and medication trials -- Continue Risperdal 3mg  po bid as bridging dose while consta becomes effective - patient currently compliant with oral medication -- Patient refuses po Tegretol and dose discontinued -- Continue Cogentin 0.5mg  bid  -- Continue Risperdal po, Geodon IM, and Ativan IM agitation protocol  -- Metabolic profile and EKG monitoring obtained while on an atypical antipsychotic ( Lipid Panel:cholesterol 180, triglycerides 79, HDL 50, LDL 114; HbgA1c: 5.9; QTc:47053ms and on repeat 459ms) Repeat EKG on 09/16/20 shows NSR 95bpm with QTc 459ms  -- Encouraged patient to participate in unit milieu and in scheduled group therapies   -- Short Term Goals: Ability to identify changes in lifestyle to reduce recurrence of condition will improve, Ability to identify and develop effective coping behaviors will improve and Compliance with prescribed medications will improve  -- Long Term Goals: Improvement in symptoms so as ready for discharge  3. Medical Issues Being Addressed:   CO2 18 and total protein 8.3 on admission - resolved  -- Repeat CMP shows Co2 22 and total protein 7.1  -- Creatinine 1.11 and encouraging po fluid intake   WBC 14.7 on admission - resolved  -- Repeat CBC shows WBC  6.5  4. Discharge Planning:   -- Social work and case management to assist with discharge planning and identification of hospital follow-up needs prior to discharge; ACTT referral pending  -- Estimated LOS: 3-4 days  -- Discharge Concerns: Need to establish a safety plan; Medication compliance and effectiveness  -- Discharge Goals: Return home with outpatient referrals for mental health follow-up including medication management/psychotherapy  Comer LocketAmy E Laneshia Pina, MD, FAPA 09/17/2020, 1:03 PM

## 2020-09-17 NOTE — Progress Notes (Addendum)
   09/17/20 0635  Vital Signs  Pulse Rate (!) 113  Pulse Rate Source Monitor  BP (!) 106/48  BP Location Right Arm  BP Method Automatic  Patient Position (if appropriate) Standing  Oxygen Therapy  SpO2 99 %   D: Patient denies SI/HI/AH. Patient denies anxiety and depression. Pt. Out in open areas and is social with staff and peers. A:  Patient took scheduled medicine.  Support and encouragement provided Routine safety checks conducted every 15 minutes. Patient  Informed to notify staff with any concerns.   R: Safety maintained.  Late note 1420: Pt. Moving to 300 hall for new possible admission for 500 unit.

## 2020-09-17 NOTE — Progress Notes (Signed)
Recreation Therapy Notes  Date: 3.30.22 Time: 1000 Location: 500 Hall Dayroom  Group Topic: Communication  Goal Area(s) Addresses:  Patient will effectively communicate with peers in group.  Patient will verbalize benefit of healthy communication. Patient will verbalize positive effect of healthy communication on post d/c goals.   Behavioral Response: Engaged  Intervention: Blank paper, Pencils, Geometrical drawings   Activity: Draw This.  One person would come to the front of the group and describe a picture to them.  They could be as specific as they needed to be to convey what they were trying to get across.  The remaining patients were to draw the picture as it was described to them.  Patients could not ask any specific questions, they could only the person to repeat themselves.  Education: Communication, Discharge Planning  Education Outcome: Acknowledges understanding/In group clarification offered/Needs additional education.   Clinical Observations/Feedback: Pt messed up on the first drawing and felt she wasn't listening good enough to get the picture right.  Pt also explained that one way to make sure the person you are speaking to understands you is by asking them questions to clarify.  Pt also expressed she was raised to look people in the eye when talking to them.     Caroll Rancher, LRT/CTRS    Caroll Rancher A 09/17/2020 11:29 AM

## 2020-09-17 NOTE — Progress Notes (Signed)
D: Patient presents with pleasant affect. Patient reports anxiety at time of assessment and states she does not like change due to her being moved from 500 hall to 300 hall. Patient denies SI/HI at this time. Patient also denies AH/VH but appears to be responding to internal stimuli. Patient contracts for safety.  A: Provided positive reinforcement and encouragement.  R: Patient cooperative and receptive to efforts. Patient remains safe on the unit.   09/17/20 2126  Psych Admission Type (Psych Patients Only)  Admission Status Involuntary  Psychosocial Assessment  Patient Complaints Anxiety;Worrying  Eye Contact Fair  Facial Expression Anxious;Pensive  Affect Preoccupied  Speech Logical/coherent;Soft  Interaction Cautious  Motor Activity Slow  Appearance/Hygiene Unremarkable  Behavior Characteristics Cooperative;Fidgety  Mood Pleasant  Thought Process  Coherency WDL  Content Paranoia  Delusions Paranoid  Perception Hallucinations  Hallucination Auditory  Judgment Impaired  Confusion Mild  Danger to Self  Current suicidal ideation? Denies  Danger to Others  Danger to Others None reported or observed  Danger to Others Abnormal  Harmful Behavior to others No threats or harm toward other people  Destructive Behavior No threats or harm toward property

## 2020-09-17 NOTE — Progress Notes (Signed)
Patient verbalized that she had a good talk with her kids today. Her goal for tomorrow is to talk to the doctor.

## 2020-09-17 NOTE — Progress Notes (Signed)
DAR NURSING NOTE:  Patient compliant with medications and programming is pleasant and mood is improved. Denies SI/HI/A/VH this shift. Support and encouragement provided. Routine safety checks conducted every 15 minutes. Patient notified to inform staff with problems or concerns.No adverse drug reactions noted. Patient contracts for safety at this time.

## 2020-09-17 NOTE — [facility] Group Notes (Signed)
Type of Therapy and Topic:  Group Therapy - Healthy vs Unhealthy Coping Skills  Participation Level:  Active  Description of Group The focus of this group was to determine what unhealthy coping techniques typically are used by group members and what healthy coping techniques would be helpful in coping with various problems. Patients were guided in becoming aware of the differences between healthy and unhealthy coping techniques. Patients were asked to identify 2-3 healthy coping skills they would like to learn to use more effectively.  Therapeutic Goals 1. Patients learned that coping is what human beings do all day long to deal with various situations in their lives 2. Patients defined and discussed healthy vs unhealthy coping techniques 3. Patients identified their preferred coping techniques and identified whether these were healthy or unhealthy 4. Patients determined 2-3 healthy coping skills they would like to become more familiar with and use more often. 5. Patients provided support and ideas to each other   Summary of Patient Progress: The patient accepted the worksheets that were provided and states that they will speak with the CSW if there are any further questions about how to reduce stress.    Therapeutic Modalities Cognitive Behavioral Therapy Motivational Interviewing  

## 2020-09-17 NOTE — [facility] Suicide Risk Assessment (Signed)
[facility] INPATIENT:  Family/Significant Other Suicide Prevention Education   Suicide Prevention Education: Education Completed; Mother, Lindsay [name] [phone]),  has been identified by the patient as the family member/significant other with whom the patient will be residing, and identified as the person(s) who will aid the patient in the event of a mental health crisis (suicidal ideations/suicide attempt).  With written consent from the patient, the family member/significant other has been provided the following suicide prevention education, prior to the and/or following the discharge of the patient.  The suicide prevention education provided includes the following:  Suicide risk factors  Suicide prevention and interventions  National Suicide Hotline telephone number  [facility] assessment telephone number  [facility] Emergency Assistance 911  [facility] and/or Residential Mobile Crisis Unit telephone number   Request made of family/significant other to:  Remove weapons (e.g., guns, rifles, knives), all items previously/currently identified as safety concern.    Remove drugs/medications (over-the-counter, prescriptions, illicit drugs), all items previously/currently identified as a safety concern.   The family member/significant other verbalizes understanding of the suicide prevention education information provided.  The family member/significant other agrees to remove the items of safety concern listed above.  CSW spoke with this patients mother who stated that this patient has been dealing with a lot of stress with her sons father who has been causing difficulties. [name] stated that this patient has a strong support system with her mother, brother, and cousins.   Pt's mother is agreeable for this patient to receive ACTT services. Pt's mother would like to be contacted with her discharge date so that she or another family member can pick this patient up from  the hospital and provide support for her at home.    Lindsay [name] MSW, LCSW Clincal Social Worker  [facility]

## 2020-09-18 MED ORDER — RISPERIDONE 1 MG PO TBDP
3.0000 mg | ORAL_TABLET | Freq: Two times a day (BID) | ORAL | Status: DC
  Administered 2020-09-18 – 2020-09-20 (×4): 3 mg via ORAL
  Filled 2020-09-18 (×9): qty 3

## 2020-09-18 MED ORDER — WHITE PETROLATUM EX OINT
TOPICAL_OINTMENT | CUTANEOUS | Status: AC
  Filled 2020-09-18: qty 5

## 2020-09-18 MED ORDER — LOPERAMIDE HCL 2 MG PO CAPS
2.0000 mg | ORAL_CAPSULE | ORAL | Status: DC | PRN
  Administered 2020-09-18: 2 mg via ORAL
  Filled 2020-09-18: qty 1

## 2020-09-18 MED ORDER — RISPERIDONE 2 MG PO TBDP
4.0000 mg | ORAL_TABLET | Freq: Every day | ORAL | Status: DC
  Filled 2020-09-18: qty 2

## 2020-09-18 MED ORDER — RISPERIDONE 1 MG PO TBDP
3.0000 mg | ORAL_TABLET | Freq: Every day | ORAL | Status: DC
  Filled 2020-09-18: qty 3

## 2020-09-18 NOTE — Progress Notes (Signed)
Pt at nurse's station c/o diarrhea. Provider notified.

## 2020-09-18 NOTE — Progress Notes (Signed)
D: Patient presents with pleasant affect and reports having a better day than the previous. Patient denies SI/HI at this time. Patient also denies AH/VH and does not appear to be responding to internal stimuli at the time of assessment. Patient contracts for safety.  A: Provided positive reinforcement and encouragement.  R: Patient cooperative and receptive to efforts. Patient remains safe on the unit.   09/18/20 2035  Psych Admission Type (Psych Patients Only)  Admission Status Involuntary  Psychosocial Assessment  Patient Complaints None  Eye Contact Fair  Facial Expression Pensive  Affect Appropriate to circumstance  Speech Logical/coherent;Soft  Interaction Assertive  Motor Activity Slow  Appearance/Hygiene Unremarkable  Behavior Characteristics Cooperative;Appropriate to situation  Mood Pleasant  Thought Process  Coherency WDL  Content WDL  Delusions Paranoid  Perception WDL  Hallucination None reported or observed  Judgment WDL  Confusion None  Danger to Self  Current suicidal ideation? Denies  Danger to Others  Danger to Others None reported or observed  Danger to Others Abnormal  Harmful Behavior to others No threats or harm toward other people  Destructive Behavior No threats or harm toward property

## 2020-09-18 NOTE — Progress Notes (Signed)
Pt denies SI/HI/AVH.  Pt reported that she was concerned as to why she was moved from 500 hall to 300 hall which did contribute to pt having some paranoia.  RN explained reason for pt transferring to 300 hallway and pt nodded in agreement.  Pt says she is resistant to change, but she is handing new environment pretty well at this time. Pt is pleasant, appropriately smiles when talking to others.  Pt took medications without incident and no adverse reactions were noted.  Pt remains safe on the unit with q 15 min checks in place.

## 2020-09-18 NOTE — B[facility] Counselor (Signed)
CSW left a message for medical records at [facility] in [place] to obtain this patients diagnosis and medication information.     Ruthann Cancer MSW, LCSW Clincal Social Worker  [facility]

## 2020-09-18 NOTE — Progress Notes (Addendum)
[facility]  09/18/2020 12:36 PM Lindsay RichterWandra A Byer  MRN:  [id number]   Chief Complaint: psychosis  Subjective:  Lindsay RichterWandra A Wos is a 30 y.o. female with a past psychiatric history significant for possible schizophrenia who was admitted on 09/13/2020 for management of delusions, irritability, and paranoia in the context of medication noncompliance. The patient is currently on Hospital Day 6.   Chart Review from last 24 hours:  The patient's chart was reviewed and nursing notes were reviewed. The patient's case was discussed in multidisciplinary team meeting. Per nursing, the patient had some anxiety with transition from 500 hall to 300 hall. Night shift nurses report that she appeared to be responding to internal stimuli but none noted on dayshift. She attended groups. She c/o diarrhea overnight. [facility] with scheduled medications and required Vistaril X1 for anxiety. Imodium ordered but patient did not require.  Information Obtained Today During Patient Interview: The patient was seen and evaluated on the unit. When questioned about reported paranoia and response to internal stimuli yesterday, she states that she got suspicious when she was moved from 500 hall to 300 hall without explanation. She states that she does not handle change well and feels this was in response to the transition on the unit. She denies AVH, current paranoia, first rank symptoms, or magical thinking. She states she was able to watch TV in the dayroom without any issues and denies ideas of reference. She has talked to family who have expressed to her that they feels she is improving. She denies medication side-effects. She has plans to return home with family after discharge and states there is a family birthday party she hopes to attend this weekend. She describes having great social supports at home. She voices no physical complaints and reports adequate sleep and appetite.   Collateral From [facility]: [facility]  [facility] [place] who states the patient last saw Dr. Geanie CooleyLay in November 2021 at which time she was diagnosed with unspecified schizophrenia spectrum and other psychotic d/o, unspecified depressive d/o, unspecified anxiety d/o, and unspecified trauma with stress related disorder. She was last prescribed Risperdal 1mg  po qhs and Trazodone 50mg  po qhs but failed to keep her 3 month follow-up. I advised the nurse that we have her on Risperdal 3mg  bid and Risperdal Consta 25mg  and that she likely will need higher Risperdal consta dose at her next scheduled injection. She agrees to relay this to her outpatient provider.  Principal Problem: Schizophrenia spectrum disorder with psychotic disorder type not yet determined (HCC) Diagnosis: Principal Problem:   Schizophrenia spectrum disorder with psychotic disorder type not yet determined (HCC)  Total Time Spent in Direct Patient Care:  I personally spent 30 minutes on the unit in direct patient care. The direct patient care time included face-to-face time with the patient, reviewing the patient's chart, communicating with other professionals, and coordinating care. Greater than 50% of this time was spent in counseling or coordinating care with the patient regarding goals of hospitalization, psycho-education, and discharge planning needs.  Past Psychiatric History: see admission H&P  Past Medical History:  Past Medical History:  Diagnosis Date  . Anemia   . Asthma   . Homozygous alpha thalassemia (HCC)   . Hx of chlamydia infection   . Hx of scoliosis   . Pregnancy induced hypertension   . Schizophrenia [facility](HCC)     Past Surgical History:  Procedure Laterality Date  . CESAREAN SECTION N/A 01/25/2019  Procedure: CESAREAN SECTION;  Surgeon: Jaymes Graff, MD;  Location: [facility] LD [facility]S;  Service: Obstetrics;  Laterality: N/A;  . TONSILLECTOMY     Family History:  Family History  Problem Relation Age of Onset  . Cancer Father         liver  . Hypertension Mother   . Diabetes Maternal Grandmother   . Cancer Maternal Grandmother    Family Psychiatric  History: see admission H&P  Social History:  Social History   Substance and Sexual Activity  Alcohol Use No     Social History   Substance and Sexual Activity  Drug Use No    Social History   Socioeconomic History  . Marital status: Single    Spouse name: Not on file  . Number of children: Not on file  . Years of education: Not on file  . Highest education level: Not on file  Occupational History  . Not on file  Tobacco Use  . Smoking status: Never Smoker  . Smokeless tobacco: Never Used  Vaping Use  . Vaping Use: Never used  Substance and Sexual Activity  . Alcohol use: No  . Drug use: No  . Sexual activity: Yes    Birth control/protection: None  Other Topics Concern  . Not on file  Social History Narrative  . Not on file   Social Determinants of Health   Financial Resource Strain: Not on file  Food Insecurity: Not on file  Transportation Needs: Not on file  Physical Activity: Not on file  Stress: Not on file  Social Connections: Not on file   Sleep: Good  Appetite:  Good  Current Medications: Current Facility-Administered Medications  Medication Dose Route Frequency Provider Last Rate Last Admin  . acetaminophen (TYLENOL) tablet 650 mg  650 mg Oral Q6H PRN Jaclyn Shaggy, PA-C      . alum & mag hydroxide-simeth (MAALOX/MYLANTA) 200-200-20 MG/5ML suspension 30 mL  30 mL Oral Q4H PRN Melbourne Abts W, PA-C      . benztropine (COGENTIN) tablet 0.5 mg  0.5 mg Oral BID Mason Jim, Kateryn Marasigan E, MD   0.5 mg at 09/18/20 0800  . hydrOXYzine (ATARAX/VISTARIL) tablet 25 mg  25 mg Oral TID PRN Ajibola, Ene A, NP   25 mg at 09/17/20 2126  . loperamide (IMODIUM) capsule 2 mg  2 mg Oral Q4H PRN Ajibola, Ene A, NP      . LORazepam (ATIVAN) tablet 1 mg  1 mg Oral Q6H PRN Mason Jim, Javanni Maring E, MD       And  . risperiDONE (RISPERDAL M-TABS) disintegrating tablet 2  mg  2 mg Oral Q8H PRN Mason Jim, Lawayne Hartig E, MD       And  . ziprasidone (GEODON) injection 20 mg  20 mg Intramuscular Q12H PRN Mason Jim, Meriah Shands E, MD      . magnesium hydroxide (MILK OF MAGNESIA) suspension 30 mL  30 mL Oral Daily PRN Ladona Ridgel, Cody W, PA-C      . risperiDONE (RISPERDAL M-TABS) disintegrating tablet 3 mg  3 mg Oral BID Atticus Wedin E, MD      . risperiDONE microspheres (RISPERDAL CONSTA) injection 25 mg  25 mg Intramuscular Q14 Days Antonieta Pert, MD   25 mg at 09/15/20 1831  . traZODone (DESYREL) tablet 50 mg  50 mg Oral QHS Ajibola, Ene A, NP   50 mg at 09/17/20 2126    Lab Results:  Results for orders placed or performed during the hospital encounter of 09/12/20 (from the past  48 hour(s))  CBC with Differential/Platelet     Status: Abnormal   Collection Time: 09/17/20  9:27 AM  Result Value Ref Range   WBC 6.5 4.0 - 10.5 K/uL   RBC 5.79 (H) 3.87 - 5.11 MIL/uL   Hemoglobin 12.5 12.0 - 15.0 g/dL   HCT 79.8 92.1 - 19.4 %   MCV 70.3 (L) 80.0 - 100.0 fL   MCH 21.6 (L) 26.0 - 34.0 pg   MCHC 30.7 30.0 - 36.0 g/dL   RDW 17.4 08.1 - 44.8 %   Platelets 219 150 - 400 K/uL   nRBC 0.0 0.0 - 0.2 %   Neutrophils Relative % 70 %   Neutro Abs 4.6 1.7 - 7.7 K/uL   Lymphocytes Relative 18 %   Lymphs Abs 1.2 0.7 - 4.0 K/uL   Monocytes Relative 8 %   Monocytes Absolute 0.5 0.1 - 1.0 K/uL   Eosinophils Relative 2 %   Eosinophils Absolute 0.1 0.0 - 0.5 K/uL   Basophils Relative 1 %   Basophils Absolute 0.0 0.0 - 0.1 K/uL   Immature Granulocytes 1 %   Abs Immature Granulocytes 0.07 0.00 - 0.07 K/uL    Comment: Performed at [facility], [address] [place], [place] [address]  Comprehensive metabolic panel     Status: Abnormal   Collection Time: 09/17/20  9:27 AM  Result Value Ref Range   Sodium 136 135 - 145 mmol/L   Potassium 4.0 3.5 - 5.1 mmol/L   Chloride 107 98 - 111 mmol/L   CO2 22 22 - 32 mmol/L   Glucose, Bld 99 70 - 99 mg/dL    Comment: Glucose  reference range applies only to samples taken after fasting for at least 8 hours.   BUN 12 6 - 20 mg/dL   Creatinine, Ser 1.49 (H) 0.44 - 1.00 mg/dL   Calcium 9.2 8.9 - 70.2 mg/dL   Total Protein 7.1 6.5 - 8.1 g/dL   Albumin 3.8 3.5 - 5.0 g/dL   AST 17 15 - 41 U/L   ALT 16 0 - 44 U/L   Alkaline Phosphatase 53 38 - 126 U/L   Total Bilirubin 0.6 0.3 - 1.2 mg/dL   GFR, Estimated >63 >78 mL/min    Comment: (NOTE) Calculated using the CKD-EPI Creatinine Equation (2021)    Anion gap 7 5 - 15    Comment: Performed at [facility], [address] [place], [place] [address]    Blood Alcohol level:  Lab Results  Component Value Date   ETH <10 04/08/2019   ETH <10 10/18/2017    Metabolic Disorder Labs: Lab Results  Component Value Date   HGBA1C 5.9 (H) 09/13/2020   MPG 122.63 09/13/2020   MPG 119.76 04/11/2019   Lab Results  Component Value Date   PROLACTIN 13.0 09/13/2020   PROLACTIN 219.0 (H) 04/11/2019   Lab Results  Component Value Date   CHOL 180 09/13/2020   TRIG 79 09/13/2020   HDL 50 09/13/2020   CHOLHDL 3.6 09/13/2020   VLDL 16 09/13/2020   LDLCALC 114 (H) 09/13/2020    Physical Findings: AIMS: Facial and Oral Movements Muscles of Facial Expression: None, normal Lips and Perioral Area: None, normal Jaw: None, normal Tongue: None, normal,Extremity Movements Upper (arms, wrists, hands, fingers): None, normal Lower (legs, knees, ankles, toes): None, normal, Trunk Movements Neck, shoulders, hips: None, normal, Overall Severity Severity of abnormal movements (highest score from questions above): None, normal Incapacitation due to abnormal movements:  None, normal Patient's awareness of abnormal movements (rate only patient's report): No Awareness, Dental Status Current problems with teeth and/or dentures?: No Does patient usually wear dentures?: No      Musculoskeletal: Strength & Muscle Tone: within normal limits Gait & Station: normal,  steady Patient leans: N/A  Psychiatric Specialty Exam: Physical Exam Vitals reviewed.  HENT:     Head: Normocephalic.  Pulmonary:     Effort: Pulmonary effort is normal.  Neurological:     Mental Status: She is alert.     Review of Systems  Respiratory: Negative for shortness of breath.   Cardiovascular: Negative for chest pain.  Gastrointestinal: Negative for nausea and vomiting.    Blood pressure 124/75, pulse 86, temperature 98.1 F (36.7 C), temperature source Oral, resp. rate 16, SpO2 99 %, unknown if currently breastfeeding.There is no height or weight on file to calculate BMI.  General Appearance: good hygiene, casually dressed, well engaged with examiner  Eye Contact:  Good  Speech:  Clear and Coherent and Normal Rate  Volume:  Normal  Mood:  described as good - appears calm and euthymic  Affect:  moderate, stable, full  Thought Process:  Goal Directed, ruminative about discharge  Orientation:  Full (Time, Place, and Person)  Thought Content:  Endorses some paranoia yesterday with unit transition but denies AVH, ideas of reference, current paranoia, magical thinking or first rank symptoms - no response to internal/external stimuli noted on exam  Suicidal Thoughts:  Denied  Homicidal Thoughts:  Denied  Memory:  Fair  Judgement:  Fair  Insight:  Fair  Psychomotor Activity:  AIMS 0; no cogwheeling, no stiffness, no tremors  Concentration:  Good  Recall: Fair  Fund of Knowledge:  Good  Language:  Good  Akathisia:  Negative  Assets:  Communication Skills Desire for Improvement Resilience  ADL's:  Intact  Cognition:  WNL  Sleep:  6.25   Treatment Plan Summary: Diagnoses / Active Problems: Unspecified schizophrenia spectrum and other psychotic d/o (r/o schizoaffective d/o, r/o schizophrenia)  PLAN: 1. Safety and Monitoring:  -- Involuntary admission to inpatient psychiatric unit for safety, stabilization and treatment  -- Daily contact with patient to assess  and evaluate symptoms and progress in treatment  -- Patient's case to be discussed in multi-disciplinary team meeting  -- Observation Level : q15 minute checks  -- Vital signs:  q12 hours  -- Precautions: suicide  2. Psychiatric Diagnoses and Treatment:   Unspecified schizophrenia spectrum and other psychotic d/o (r/o schizoaffective d/o, r/o schizophrenia)  -- I personally called [facility] for collateral on 09/18/20 to confirm outpatient diagnosis and outpatient medication regimen and to coordinate care regarding f/u for consta injection - see above  -- Asking social work to contact family to see if they feel she is nearing clinical baseline for discharge -- Continue Risperdal  po bid as bridging po dose while consta becomes effective -Patient states she feels stable on current dose and declines dose increase - will continue to monitor but currently has no evidence of acute psychosis to warrant dose titration; likely will need higher Risperdal consta dose at time of next injection which was explained to patient -- Patient refuses po Tegretol and dose discontinued -- Continue Cogentin 0.5mg  bid  -- Continue Risperdal po, Geodon IM, and Ativan IM agitation protocol  -- Metabolic profile and EKG monitoring obtained while on an atypical antipsychotic ( Lipid Panel:cholesterol 180, triglycerides 79, HDL 50, LDL 114; HbgA1c: 5.9; QTc:451ms and on repeat ) Repeat EKG on  09/16/20 shows NSR 95bpm with QTc  -- Encouraged patient to participate in unit milieu and in scheduled group therapies   -- Short Term Goals: Ability to identify changes in lifestyle to reduce recurrence of condition will improve, Ability to identify and develop effective coping behaviors will improve and Compliance with prescribed medications will improve  -- Long Term Goals: Improvement in symptoms so as ready for discharge  3. Medical Issues Being Addressed:   CO2 18 and total protein 8.3 on admission -  resolved  -- Repeat CMP shows Co2 22 and total protein 7.1  -- Creatinine 1.11 and encouraging po fluid intake - will need to be rechecked by PCP after discharge   WBC 14.7 on admission - resolved  -- Repeat CBC shows WBC 6.5  4. Discharge Planning:   -- Social work and case management to assist with discharge planning and identification of hospital follow-up needs prior to discharge; ACTT referral pending  -- Estimated LOS: 2-3 days  -- Discharge Concerns: Need to establish a safety plan; Medication compliance and effectiveness  -- Discharge Goals: Return home with outpatient referrals for mental health follow-up including medication management/psychotherapy  Comer Locket, MD, FAPA 09/18/2020, 12:36 PM

## 2020-09-18 NOTE — Progress Notes (Signed)
Psychoeducational Group Note  Date:  09/18/2020 Time:  2015  Group Topic/Focus:  wrap up group  Participation Level: Did Not Attend  Participation Quality:  Not Applicable  Affect:  Not Applicable  Cognitive:  Not Applicable  Insight:  Not Applicable  Engagement in Group: Not Applicable  Additional Comments:  Did not attend.   Marcille Buffy 09/18/2020, 10:28 PM

## 2020-09-19 ENCOUNTER — Telehealth: Payer: Self-pay | Admitting: Family Medicine

## 2020-09-19 MED ORDER — WHITE PETROLATUM EX OINT
TOPICAL_OINTMENT | CUTANEOUS | Status: AC
  Filled 2020-09-19: qty 5

## 2020-09-19 NOTE — Progress Notes (Signed)
Recreation Therapy Notes  Date: 4.1.22 Time: 0930 Location: 300 Hall Dayroom  Group Topic: Stress Management  Goal Area(s) Addresses:  Patient will actively participate in Anger management techniques presented during session.  Patient will successfully identify benefit of practicing Anger management post d/c.   Intervention: Stress management techniques  Activity : Progressive Muscle Relaxation  LRT provided education, instruction and demonstration on practice of Progressive Muscle Relaxation. Patient was asked to participate in technique introduced during session. Patient were to follow along as LRT read the script to engage in activity.  Education: Stress Management, Discharge Planning.   Education Outcome: Acknowledges education/In group clarification offered  Clinical Observations/Feedback: Patient did not attend group session.   Delance Weide, LRT/CTRS         Jacob Cicero A 09/19/2020 11:10 AM 

## 2020-09-19 NOTE — Progress Notes (Signed)
[facility]  09/19/2020 12:31 PM Lindsay RichterWandra A Lasseigne  MRN:  [id number]   Chief Complaint: psychosis  Subjective:  Lindsay RichterWandra A Ridgeway is a 30 y.o. female with a past psychiatric history significant for possible schizophrenia who was admitted on 09/13/2020 for management of delusions, irritability, and paranoia in the context of medication noncompliance. The patient is currently on Hospital Day 7.   Chart Review from last 24 hours:  The patient's chart was reviewed and nursing notes were reviewed. The patient's case was discussed in multidisciplinary team meeting. Per nursing, the patient did well yesterday without evidence of response to internal/external stimuli and with stable mood. Per MAR she took Imodium X1 but no PRNs needed for anxiety or agitation.  Information Obtained Today During Patient Interview: The patient was seen and evaluated on the unit. She states she is doing well today and slept well last night. She reports good appetite. She voices no physical complaints. She denies AVH, paranoia, ideas of reference, magical thinking, or first rank symptoms. She specifically gives an example of watching a western on TV today with peers in the dayroom "without being afraid." She denies SI or HI. She denies medication side-effects and she specifically denies any TD/EPS symptoms. We discussed the plans for her to continue Risperdal consta injections every 2 weeks after discharge with ACTT intake after discharge. She questioned if she could consider the monthly Risperdal injection as an option or if she could resume oral medications in place of a LAI in the future, and I suggested she discuss this with her outpatient provider once she is released. We dicussed that she currently is requiring a much higher Risperdal dose to manage her symptoms than she previously was taking as an outpatient, and I advised that she not abruptly change her dosing after discharge to avoid rebound psychosis. I discussed that  over time her outpatient provider can assess and see if she still requires this present dose for maintenance management once she has had a period of stability. I discussed that if she continues Risperdal consta that she will likely need a dose increase in her LAI after discharge. She requests a work note after discharge so she has time to gradually transition back to work once she feels stable after discharge. Time was given for questions.   Principal Problem: Schizophrenia spectrum disorder with psychotic disorder type not yet determined (HCC) Diagnosis: Principal Problem:   Schizophrenia spectrum disorder with psychotic disorder type not yet determined (HCC)  Total Time Spent in Direct Patient Care:  I personally spent 30 minutes on the unit in direct patient care. The direct patient care time included face-to-face time with the patient, reviewing the patient's chart, communicating with other professionals, and coordinating care. Greater than 50% of this time was spent in counseling or coordinating care with the patient regarding goals of hospitalization, psycho-education, and discharge planning needs.  Past Psychiatric History: see admission H&P  Past Medical History:  Past Medical History:  Diagnosis Date  . Anemia   . Asthma   . Homozygous alpha thalassemia (HCC)   . Hx of chlamydia infection   . Hx of scoliosis   . Pregnancy induced hypertension   . Schizophrenia [facility](HCC)     Past Surgical History:  Procedure Laterality Date  . CESAREAN SECTION N/A 01/25/2019   Procedure: CESAREAN SECTION;  Surgeon: Jaymes Graffillard, Naima, MD;  Location: [facility] LD ORS;  Service: Obstetrics;  Laterality: N/A;  . TONSILLECTOMY     Family History:  Family History  Problem Relation Age of Onset  . Cancer Father        liver  . Hypertension Mother   . Diabetes Maternal Grandmother   . Cancer Maternal Grandmother    Family Psychiatric  History: see admission H&P  Social History:  Social History   Substance  and Sexual Activity  Alcohol Use No     Social History   Substance and Sexual Activity  Drug Use No    Social History   Socioeconomic History  . Marital status: Single    Spouse name: Not on file  . Number of children: Not on file  . Years of education: Not on file  . Highest education level: Not on file  Occupational History  . Not on file  Tobacco Use  . Smoking status: Never Smoker  . Smokeless tobacco: Never Used  Vaping Use  . Vaping Use: Never used  Substance and Sexual Activity  . Alcohol use: No  . Drug use: No  . Sexual activity: Yes    Birth control/protection: None  Other Topics Concern  . Not on file  Social History Narrative  . Not on file   Social Determinants of Health   Financial Resource Strain: Not on file  Food Insecurity: Not on file  Transportation Needs: Not on file  Physical Activity: Not on file  Stress: Not on file  Social Connections: Not on file   Sleep: Good  Appetite:  Good  Current Medications: Current Facility-Administered Medications  Medication Dose Route Frequency Provider Last Rate Last Admin  . acetaminophen (TYLENOL) tablet 650 mg  650 mg Oral Q6H PRN Jaclyn Shaggy, PA-C      . alum & mag hydroxide-simeth (MAALOX/MYLANTA) 200-200-20 MG/5ML suspension 30 mL  30 mL Oral Q4H PRN Melbourne Abts W, PA-C      . benztropine (COGENTIN) tablet 0.5 mg  0.5 mg Oral BID Mason Jim, Ricquel Foulk E, MD   0.5 mg at 09/19/20 0814  . hydrOXYzine (ATARAX/VISTARIL) tablet 25 mg  25 mg Oral TID PRN Ajibola, Ene A, NP   25 mg at 09/17/20 2126  . loperamide (IMODIUM) capsule 2 mg  2 mg Oral Q4H PRN Ajibola, Ene A, NP   2 mg at 09/18/20 2035  . LORazepam (ATIVAN) tablet 1 mg  1 mg Oral Q6H PRN Mason Jim, Demeco Ducksworth E, MD       And  . risperiDONE (RISPERDAL M-TABS) disintegrating tablet 2 mg  2 mg Oral Q8H PRN Mason Jim, Navi Erber E, MD       And  . ziprasidone (GEODON) injection 20 mg  20 mg Intramuscular Q12H PRN Mason Jim, Curby Carswell E, MD      . magnesium hydroxide (MILK  OF MAGNESIA) suspension 30 mL  30 mL Oral Daily PRN Melbourne Abts W, PA-C      . risperiDONE (RISPERDAL M-TABS) disintegrating tablet 3 mg  3 mg Oral BID Comer Locket, MD   3 mg at 09/19/20 0815  . risperiDONE microspheres (RISPERDAL CONSTA) injection 25 mg  25 mg Intramuscular Q14 Days Antonieta Pert, MD   25 mg at 09/15/20 1831  . traZODone (DESYREL) tablet 50 mg  50 mg Oral QHS Ajibola, Ene A, NP   50 mg at 09/18/20 2035    Lab Results:  No results found for this or any previous visit (from the past 48 hour(s)).  Blood Alcohol level:  Lab Results  Component Value Date   ETH <10 04/08/2019   ETH <10 10/18/2017    Metabolic Disorder Labs:  Lab Results  Component Value Date   HGBA1C 5.9 (H) 09/13/2020   MPG 122.63 09/13/2020   MPG 119.76 04/11/2019   Lab Results  Component Value Date   PROLACTIN 13.0 09/13/2020   PROLACTIN 219.0 (H) 04/11/2019   Lab Results  Component Value Date   CHOL 180 09/13/2020   TRIG 79 09/13/2020   HDL 50 09/13/2020   CHOLHDL 3.6 09/13/2020   VLDL 16 09/13/2020   LDLCALC 114 (H) 09/13/2020    Physical Findings: AIMS: Facial and Oral Movements Muscles of Facial Expression: None, normal Lips and Perioral Area: None, normal Jaw: None, normal Tongue: None, normal,Extremity Movements Upper (arms, wrists, hands, fingers): None, normal Lower (legs, knees, ankles, toes): None, normal, Trunk Movements Neck, shoulders, hips: None, normal, Overall Severity Severity of abnormal movements (highest score from questions above): None, normal Incapacitation due to abnormal movements: None, normal Patient's awareness of abnormal movements (rate only patient's report): No Awareness, Dental Status Current problems with teeth and/or dentures?: No Does patient usually wear dentures?: No      Musculoskeletal: Strength & Muscle Tone: within normal limits Gait & Station: normal, steady Patient leans: N/A  Psychiatric Specialty Exam: Physical  Exam Vitals reviewed.  HENT:     Head: Normocephalic.  Pulmonary:     Effort: Pulmonary effort is normal.  Neurological:     Mental Status: She is alert.     Review of Systems  Respiratory: Negative for shortness of breath.   Cardiovascular: Negative for chest pain.  Gastrointestinal: Negative for nausea and vomiting.    Blood pressure (!) 146/91, pulse (!) 103, temperature 98 F (36.7 C), temperature source Oral, resp. rate 18, SpO2 99 %, unknown if currently breastfeeding.There is no height or weight on file to calculate BMI.  General Appearance: good hygiene, casually dressed, well engaged with examiner  Eye Contact:  Good  Speech:  Clear and Coherent and Normal Rate  Volume:  Normal  Mood:  described as good - appears calm and euthymic  Affect:  moderate, stable, full  Thought Process:  Goal Directed, linear  Orientation:  Full (Time, Place, and Person)  Thought Content:  Denies AVH, paranoia, delusions, ideas of reference, or first rank symptoms; no acute psychosis noted on exam  Suicidal Thoughts:  Denied  Homicidal Thoughts:  Denied  Memory:  Fair  Judgement:  Fair  Insight:  Fair  Psychomotor Activity:  No tremors, no restlessness  Concentration:  Good  Recall: Fair  Fund of Knowledge:  Good  Language:  Good  Akathisia:  Negative  Assets:  Communication Skills Desire for Improvement Resilience  ADL's:  Intact  Cognition:  WNL  Sleep:  6.5   Treatment Plan Summary: Diagnoses / Active Problems: Unspecified schizophrenia spectrum and other psychotic d/o (r/o schizoaffective d/o, r/o schizophrenia)  PLAN: 1. Safety and Monitoring:  -- Involuntary admission to inpatient psychiatric unit for safety, stabilization and treatment  -- Daily contact with patient to assess and evaluate symptoms and progress in treatment  -- Patient's case to be discussed in multi-disciplinary team meeting  -- Observation Level : q15 minute checks  -- Vital signs:  q12 hours  --  Precautions: suicide  2. Psychiatric Diagnoses and Treatment:   Unspecified schizophrenia spectrum and other psychotic d/o (r/o schizoaffective d/o, r/o schizophrenia) -- Continue Risperdal 3mg  po bid as bridging po dose while consta becomes effective -likely will need higher Risperdal consta dose at time of next injection which was explained to patient -- Patient refuses po Tegretol and dose  discontinued -- Continue Cogentin 0.5mg  bid  -- Continue Risperdal po, Geodon IM, and Ativan IM agitation protocol  -- Metabolic profile and EKG monitoring obtained while on an atypical antipsychotic ( Lipid Panel:cholesterol 180, triglycerides 79, HDL 50, LDL 114; HbgA1c: 5.9; QTc:460ms and on repeat ) Repeat EKG on 09/16/20 shows NSR 95bpm with QTc  -- Encouraged patient to participate in unit milieu and in scheduled group therapies   -- Short Term Goals: Ability to identify changes in lifestyle to reduce recurrence of condition will improve, Ability to identify and develop effective coping behaviors will improve and Compliance with prescribed medications will improve  -- Long Term Goals: Improvement in symptoms so as ready for discharge  3. Medical Issues Being Addressed:   CO2 18 and total protein 8.3 on admission - resolved  -- Repeat CMP shows Co2 22 and total protein 7.1  -- Creatinine 1.11 and encouraging po fluid intake - will need to be rechecked by PCP after discharge (offered to recheck BMP tomorrow and patient declines lab and states she has been drinking fluids liberally)   WBC 14.7 on admission - resolved  -- Repeat CBC shows WBC 6.5  4. Discharge Planning:   -- Social work and case management to assist with discharge planning and identification of hospital follow-up needs prior to discharge; ACTT referral pending  -- Estimated LOS: 1 day  -- Discharge Concerns: Need to establish a safety plan; Medication compliance and effectiveness  -- Discharge Goals: Return home with  outpatient referrals for mental health follow-up including medication management/psychotherapy  Comer Locket, MD, FAPA 09/19/2020, 12:31 PM      Physical Exam Vitals reviewed.  HENT:     Head: Normocephalic.  Pulmonary:     Effort: Pulmonary effort is normal.  Neurological:     Mental Status: She is alert.     Review of Systems  Respiratory: Negative for shortness of breath.   Cardiovascular: Negative for chest pain.  Gastrointestinal: Negative for nausea and vomiting.

## 2020-09-19 NOTE — BHH Group Notes (Signed)
Adult Psychoeducational Group Note  Date:  09/19/2020 Time:  10:47 AM  Group Topic/Focus:  Managing Feelings:   The focus of this group is to identify what feelings patients have difficulty handling and develop a plan to handle them in a healthier way upon discharge.  Participation Level:  Did Not Attend  Deforest Hoyles [facility] 09/19/2020, 10:47 AM

## 2020-09-19 NOTE — Plan of Care (Signed)
Nurse discussed anxiety, depression and coping skills with patient.  

## 2020-09-19 NOTE — Progress Notes (Signed)
D:  Patient denied SI and HI, contracts for safety.  Denied A/V hallucinations.  Denied pain. A:  Medications administered per MD orders.  Emotional support and encouragement given patient. R:  Safety maintained with 15 minute checks.  

## 2020-09-19 NOTE — BHH Group Notes (Signed)
Type of Therapy:  Group Therapy: Gratitude   Participation Level:  Did Not Attend  Summary of Progress/Problems: Did Not Attend  Fredirick Lathe, LCSWA Clinicial Social Worker [employer]

## 2020-09-19 NOTE — Progress Notes (Signed)
Pt stated she was feeling better, pt concerned about situation with her child and custody on D/C. Pt educated on taking care of self first before she starts doing other things.     09/19/20 2300  Psych Admission Type (Psych Patients Only)  Admission Status Involuntary  Psychosocial Assessment  Patient Complaints Anxiety  Eye Contact Fair  Facial Expression Pensive  Affect Appropriate to circumstance  Speech Logical/coherent;Soft  Interaction Assertive  Motor Activity Slow  Appearance/Hygiene Unremarkable  Behavior Characteristics Anxious  Mood Preoccupied;Anxious  Thought Process  Coherency WDL  Content WDL  Delusions Paranoid  Perception WDL  Hallucination None reported or observed  Judgment WDL  Confusion None  Danger to Self  Current suicidal ideation? Denies  Danger to Others  Danger to Others None reported or observed  Danger to Others Abnormal  Harmful Behavior to others No threats or harm toward other people  Destructive Behavior No threats or harm toward property

## 2020-09-19 NOTE — BHH Suicide Risk Assessment (Signed)
[facility] Discharge Suicide Risk Assessment   Principal Problem: Schizophrenia spectrum disorder with psychotic disorder type not yet determined [facility]) Discharge Diagnoses: Principal Problem:   Schizophrenia spectrum disorder with psychotic disorder type not yet determined (HCC)  Total Time Spent in Direct Patient Care:  I personally spent 30 minutes on the unit in direct patient care. The direct patient care time included face-to-face time with the patient, reviewing the patient's chart, communicating with other professionals, and coordinating care. Greater than 50% of this time was spent in counseling or coordinating care with the patient regarding goals of hospitalization, psycho-education, and discharge planning needs.  Subjective: Patient was seen on rounds. Patient slept 5.75 hours overnight. Patient denies SI, HI, AVH, delusions, paranoia, ideas of reference, first rank symptoms, or magical thinking. She reports stable mood. She reports stable sleep and appetite. She voices no physical complaints. She is able to articulate her discharge plans, and we reviewed the plans for ACTT referral next week. I reminded her that she is due to get her next Risperdal consta injection on 09/29/20 and that her outpatient provider will need to determine her next consta dose after discharge. Time was given for questions.  Musculoskeletal: Strength & Muscle Tone: within normal limits Gait & Station: normal, steady Patient leans: N/A  Psychiatric Specialty Exam: Review of Systems  Respiratory: Negative for shortness of breath.   Cardiovascular: Negative for chest pain.  Gastrointestinal: Negative for constipation, diarrhea, nausea and vomiting.    Blood pressure 136/79, pulse 96, temperature 98.6 F (37 C), temperature source Oral, resp. rate 18, SpO2 100 %, unknown if currently breastfeeding.There is no height or weight on file to calculate BMI.  General Appearance: casually dressed, good hygiene, appears  stated age  Eye Contact::  Good  Speech:  Clear and Coherent and Normal Rate  Volume:  Normal  Mood:  euthymic  Affect:  Moderate, stable, full  Thought Process:  Goal Directed  Orientation:  Full (Time, Place, and Person)  Thought Content:  Logical and Denies AVH, paranoia, delusions, first rank symptoms, or ideas of reference; no acute psychosis or response to internal/external stimuli on exam  Suicidal Thoughts:  Denied  Homicidal Thoughts:  Denied  Memory:  Immediate - good  Judgement:  Fair  Insight:  Fair  Psychomotor Activity:  Normal  Concentration:  Good  Recall:  Fair  Fund of Knowledge:Good  Language: Good  Akathisia:  Negative  Assets:  Communication Skills Desire for Improvement Housing Resilience Social Support Vocational/Educational  Sleep:  Number of Hours: 5.75  Cognition: WNL  ADL's:  Intact   Mental Status Per Nursing Assessment::   On Admission:  Psychosis, paranoia, HI and medication noncompliance  Demographic Factors:  Limited income, single  Loss Factors: Strained relationship with father of her child  Historical Factors: Previous inpatient psychiatric admissions  Risk Reduction Factors:   Responsible for children under 10 years of age, Sense of responsibility to family, Employed, Living with another person, especially a relative, Positive social support and Positive therapeutic relationship, referral for ACTT at discharge  Continued Clinical Symptoms:  Previous Psychiatric Diagnoses and Treatments Unspecified Schizophrenia Spectrum and other Psychotic d/o diagnosis  Cognitive Features That Contribute To Risk:  None    Suicide Risk:  Mild:  Suicidal ideation of limited frequency, intensity, duration, and specificity.  There are no identifiable plans, no associated intent, mild dysphoria and related symptoms, good self-control (both objective and subjective assessment), few other risk factors, and identifiable protective factors, including  available and accessible social  support.   Follow-up Information    Services, [facility]. Call.   Why: A referral has been made on your behalf to this provider for  ACTT team services.  Please contact the provider upon discharge to discuss. Contact information: [address] [place] [address] [phone]        [facility]. Go on 09/24/2020.   Why: You have an appointment on 09/24/20 at 12:00 pm to establish care with this provider for primary care services.  This appointment will be held in person. Contact information: [address] [place] [address] [phone]              Plan Of Care/Follow-up recommendations:  Activity:  as tolerated Diet:  heart healthy Other:  Patient advised to comply with medications and to keep scheduled outpatient mental health follow up appointment without fail. She received her Risperdal Consta injection 25mg  on 09/15/20 and is due for next Risperdal consta injection on 09/29/20. She was advised that she will likely require a higher Risperdal consta dose at her next scheduled injection and that she needs to be compliant with oral Risperdal dosing while the shot becomes therapeutic. Her outpatient provider should instruct her when to discontinue the oral Risperdal and will reassess to see what dose of Risperdal consta she needs as an outpatient. She was advised to see a primary care provider without fail for recheck of her creatinine and blood pressure. She was advised she will need ongoing glucose, lipid, weight, EKG, and CBC monitoring while on Risperdal.   11/29/20, MD, FAPA 09/20/2020, 7:05 AM

## 2020-09-20 DIAGNOSIS — F29 Unspecified psychosis not due to a substance or known physiological condition: Secondary | ICD-10-CM

## 2020-09-20 MED ORDER — RISPERIDONE 3 MG PO TBDP: 3.0000 mg | ORAL_TABLET | Freq: Two times a day (BID) | ORAL | 0 refills | Status: DC

## 2020-09-20 MED ORDER — WHITE PETROLATUM EX OINT
TOPICAL_OINTMENT | CUTANEOUS | Status: AC
  Filled 2020-09-20: qty 5

## 2020-09-20 MED ORDER — BENZTROPINE MESYLATE 0.5 MG PO TABS: 0.5000 mg | ORAL_TABLET | Freq: Two times a day (BID) | ORAL | 0 refills | Status: DC

## 2020-09-20 MED ORDER — TRAZODONE HCL 50 MG PO TABS: 50.0000 mg | ORAL_TABLET | Freq: Every day | ORAL | 0 refills | Status: DC

## 2020-09-20 NOTE — Progress Notes (Signed)
This RN who is the preceptor for Rolland Porter, student RN, reviewed Su Hilt flowsheets and assessment for today at 815am.  This RN is in agreement with assessment and pt's plan of care.

## 2020-09-20 NOTE — BHH Group Notes (Signed)
.  Psychoeducational Group Note  Date: 09-20-2020 Time: 0900-1000    Goal Setting   Purpose of Group: This group helps to provide patients with the steps of setting a goal that is specific, measurable, attainable, realistic and time specific. A discussion on how we keep ourselves stuck with negative self talk.    Participation Level:  Active  Participation Quality:  Appropriate  Affect:  Appropriate  Cognitive:  Appropriate  Insight:  Improving  Engagement in Group:  Engaged  Additional Comments:  Pt rates her energy at a 5/10. Shared in the group and was supported by her peers. Given support.  Dione Housekeeper

## 2020-09-20 NOTE — Discharge Summary (Addendum)
Physician Discharge Summary Note  Patient:  Lindsay [name] is an 30 y.o., female MRN:  [id number] DOB:  09-Sep-1990 Patient phone:  [phone] (home)  Patient address:   [address] [place] [address],  Total Time spent with patient: 30 minutes  Date of Admission:  09/12/2020 Date of Discharge: 09/20/2020  Reason for Admission:  (From MD's admission note): Patient is a 30 year old female with a reported past psychiatric history significant for schizophrenia who originally presented to the [facility] emergency department under involuntary commitment. The patient had been noncompliant with medications, and the family stated the patient had homicidal ideation and plan to take the patient to [facility] but became uncooperative, and police were called. The patient was a poor historian, and her only question was whether or not she could refuse her medications this morning. Previous examinations by the comprehensive clinical examination team did not provide a great deal more information. The patient reported that she was to be handled by "Jesus" but would not give any additional information. Urine drug screen was positive for benzodiazepines. Her last psychiatric hospitalization at our facility was in October 2020. Her diagnosis at that time included schizophrenia. Her discharge medications included Cogentin, Tegretol, ibuprofen, Risperdal and temazepam. This is her third psychiatric hospitalization at our facility. Her first admission in May 2019 was her diagnosis with her first break of schizophrenia. She had been given the long-acting Abilify injection secondary to refusal of medications. She was admitted to the hospital for evaluation and stabilization.  Evaluation on the unit, day of discharge: Patient was seen, chart reviewed and case discussed with the treatment team. Patient denies SI/HI/AVH, paranoia and delusions. She is eating and sleeping well. She has been attending group  therapy and interacting appropriately on the unit. She is taking her medications and has had no issues with them. She agrees to attend her follow up appointments and continue taking her medications. Patient is stable for discharge home today.   Principal Problem: Schizophrenia spectrum disorder with psychotic disorder type not yet determined [facility]) Discharge Diagnoses: Principal Problem:   Schizophrenia spectrum disorder with psychotic disorder type not yet determined [facility])   Past Psychiatric History: See H&P  Past Medical History:  Past Medical History:  Diagnosis Date  . Anemia   . Asthma   . Homozygous alpha thalassemia (HCC)   . Hx of chlamydia infection   . Hx of scoliosis   . Pregnancy induced hypertension   . Schizophrenia [facility])     Past Surgical History:  Procedure Laterality Date  . CESAREAN SECTION N/A 01/25/2019   Procedure: CESAREAN SECTION;  Surgeon: Jaymes Graff, MD;  Location: [facility] LD ORS;  Service: Obstetrics;  Laterality: N/A;  . TONSILLECTOMY     Family History:  Family History  Problem Relation Age of Onset  . Cancer Father        liver  . Hypertension Mother   . Diabetes Maternal Grandmother   . Cancer Maternal Grandmother    Family Psychiatric  History: See H&P Social History:  Social History   Substance and Sexual Activity  Alcohol Use No     Social History   Substance and Sexual Activity  Drug Use No    Social History   Socioeconomic History  . Marital status: Single    Spouse name: Not on file  . Number of children: Not on file  . Years of education: Not on file  . Highest education level: Not on file  Occupational History  .  Not on file  Tobacco Use  . Smoking status: Never Smoker  . Smokeless tobacco: Never Used  Vaping Use  . Vaping Use: Never used  Substance and Sexual Activity  . Alcohol use: No  . Drug use: No  . Sexual activity: Yes    Birth control/protection: None  Other Topics Concern  . Not on file  Social History  Narrative  . Not on file   Social Determinants of Health   Financial Resource Strain: Not on file  Food Insecurity: Not on file  Transportation Needs: Not on file  Physical Activity: Not on file  Stress: Not on file  Social Connections: Not on file    Hospital Course:  After the above admission evaluation, Lindsay [name]'s presenting symptoms were noted. She was recommended for mood stabilization treatments. The medication regimen targeting those presenting symptoms were discussed with her & initiated with her consent. Her UDS on arrival to the ED was negative. She was however medicated, stabilized & discharged on the medications as listed on her discharge medication lists below. Besides the mood stabilization treatments, Lindsay [name] was also enrolled & participated in the group counseling sessions being offered & held on this unit. She learned coping skills. She presented no other significant pre-existing medical issues that required treatment. She tolerated his treatment regimen without any adverse effects or reactions reported.   During the course of her hospitalization, the 15-minute checks were adequate to ensure patient's safety. Lindsay [name] did not display any dangerous, violent or suicidal behavior on the unit. She interacted with patients & staff appropriately, participated appropriately in the group sessions/therapies. Her medications were addressed & adjusted to meet her needs. She was recommended for outpatient follow-up care & medication management upon discharge to assure continuity of care & mood stability.  At the time of discharge patient is not reporting any acute suicidal/homicidal ideations. She feels more confident about her self-care & in managing his mental health. She currently denies any new issues or concerns. Education and supportive counseling provided throughout her hospital stay & upon discharge.   Today upon her discharge evaluation with the attending psychiatrist, Lindsay [name] shares she is  doing well. She denies any other specific concerns. She is sleeping well. Her appetite is good. She denies other physical complaints. She denies AH/VH, delusional thoughts or paranoia. She does not appear to be responding to any internal stimuli. She feels that her medications have been helpful & is in agreement to continue her current treatment regimen as recommended. She was able to engage in safety planning including plan to [facility] or contact emergency services if she feels unable to maintain her own safety or the safety of others. Pt had no further questions, comments, or concerns. [facility] with [facility] personal belongings in no apparent distress. Transportation per her mother.   Physical Findings: AIMS: Facial and Oral Movements Muscles of Facial Expression: None, normal Lips and Perioral Area: None, normal Jaw: None, normal Tongue: None, normal,Extremity Movements Upper (arms, wrists, hands, fingers): None, normal Lower (legs, knees, ankles, toes): None, normal, Trunk Movements Neck, shoulders, hips: None, normal, Overall Severity Severity of abnormal movements (highest score from questions above): None, normal Incapacitation due to abnormal movements: None, normal Patient's awareness of abnormal movements (rate only patient's report): No Awareness, Dental Status Current problems with teeth and/or dentures?: No Does patient usually wear dentures?: No  CIWA:    COWS:     Musculoskeletal: Strength & Muscle Tone: within normal limits Gait & Station: normal Patient leans:  N/A  Psychiatric Specialty Exam:  Presentation  General Appearance: Appropriate for Environment; Casual  Eye Contact:Good  Speech:Clear and Coherent; Normal Rate  Speech Volume:Normal  Handedness:Right  Mood and Affect  Mood:Euthymic  Affect:Congruent  Thought Process  Thought Processes:Coherent; Goal Directed  Descriptions of Associations:Intact  Orientation:Full (Time, Place and  Person)  Thought Content:Logical  History of Schizophrenia/Schizoaffective disorder:Yes (Schizoaffective Disorder)  Duration of Psychotic Symptoms:Greater than six months  Hallucinations:Hallucinations: None  Ideas of Reference:None  Suicidal Thoughts:Suicidal Thoughts: No  Homicidal Thoughts:Homicidal Thoughts: No  Sensorium  Memory:Immediate Fair; Recent Fair; Remote Fair  Judgment:Fair  Insight:Fair  Executive Functions  Concentration:Good  Attention Span:Good  Recall:Fair  Fund of Knowledge:Good  Language:Good  Psychomotor Activity  Psychomotor Activity:Psychomotor Activity: Normal  Assets  Assets:Communication Skills; Desire for Improvement; Financial Resources/Insurance; Housing; Physical Health; Resilience; Social Support  Sleep  Sleep:Sleep: Fair Number of Hours of Sleep: 5.75  Physical Exam: Physical Exam Vitals and nursing note reviewed.  Constitutional:      Appearance: Normal appearance.  HENT:     Head: Normocephalic.  Pulmonary:     Effort: Pulmonary effort is normal.  Musculoskeletal:        General: Normal range of motion.     Cervical back: Normal range of motion.  Neurological:     General: No focal deficit present.     Mental Status: She is alert and oriented to person, place, and time.  Psychiatric:        Attention and Perception: Attention normal. She does not perceive auditory or visual hallucinations.        Mood and Affect: Mood normal.        Speech: Speech normal.        Behavior: Behavior normal. Behavior is cooperative.        Thought Content: Thought content normal.        Cognition and Memory: Cognition normal.    ROS Blood pressure 136/79, pulse 96, temperature 98.6 F (37 C), temperature source Oral, resp. rate 18, SpO2 100 %, unknown if currently breastfeeding. There is no height or weight on file to calculate BMI.   Have you used any form of tobacco in the last 30 days? (Cigarettes, Smokeless Tobacco, Cigars,  and/or Pipes): No  Has this patient used any form of tobacco in the last 30 days? (Cigarettes, Smokeless Tobacco, Cigars, and/or Pipes) Yes, N/A, patient does not smoke  Blood Alcohol level:  Lab Results  Component Value Date   ETH <10 04/08/2019   ETH <10 10/18/2017    Metabolic Disorder Labs:  Lab Results  Component Value Date   HGBA1C 5.9 (H) 09/13/2020   MPG 122.63 09/13/2020   MPG 119.76 04/11/2019   Lab Results  Component Value Date   PROLACTIN 13.0 09/13/2020   PROLACTIN 219.0 (H) 04/11/2019   Lab Results  Component Value Date   CHOL 180 09/13/2020   TRIG 79 09/13/2020   HDL 50 09/13/2020   CHOLHDL 3.6 09/13/2020   VLDL 16 09/13/2020   LDLCALC 114 (H) 09/13/2020    See Psychiatric Specialty Exam and Suicide Risk Assessment completed by Attending Physician prior to discharge.  Discharge destination:  Home  Is patient on multiple antipsychotic therapies at discharge:  Yes,   Do you recommend tapering to monotherapy for antipsychotics?  No   Has Patient had three or more failed trials of antipsychotic monotherapy by history:  No  Recommended Plan for Multiple Antipsychotic Therapies: NA  Discharge Instructions    Diet - low sodium  heart healthy   Complete by: As directed    Increase activity slowly   Complete by: As directed      Allergies as of 09/20/2020   No Known Allergies     Medication List    STOP taking these medications   risperiDONE 1 MG tablet Commonly known as: RISPERDAL Replaced by: risperidone 3 MG disintegrating tablet     TAKE these medications     Indication  albuterol 108 (90 Base) MCG/ACT inhaler Commonly known as: VENTOLIN HFA Inhale 2 puffs into the lungs every 6 (six) hours as needed.  Indication: Asthma   benztropine 0.5 MG tablet Commonly known as: COGENTIN Take 1 tablet (0.5 mg total) by mouth 2 (two) times daily.  Indication: Extrapyramidal Reaction caused by Medications   risperidone 3 MG disintegrating  tablet Commonly known as: RISPERDAL M-TABS Take 1 tablet (3 mg total) by mouth 2 (two) times daily. Replaces: risperiDONE 1 MG tablet  Indication: Schizoaffective Disorder   traZODone 50 MG tablet Commonly known as: DESYREL Take 1 tablet (50 mg total) by mouth at bedtime.  Indication: Trouble Sleeping       Follow-up Information    [facility], [facility]. Call.   Why: A referral has been made on your behalf to this provider for  ACTT team services.  Please contact the provider upon discharge to discuss. Contact information: [address] [place] [address] [phone]        [facility]. Go on 09/24/2020.   Why: You have an appointment on 09/24/20 at 12:00 pm to establish care with this provider for primary care services.  This appointment will be held in person. Contact information: [address] [place] [address] [phone]              Follow-up recommendations:  Activity:  as tolerated Diet:  Heart Healthy  Comments:  Prescriptions given at discharge.  Patient agreeable to plan.  Given opportunity to ask questions.  Appears to feel comfortable with discharge denies any current suicidal or homicidal thoughts.   Patient is instructed prior to discharge to: Take all medications as prescribed by her mental healthcare provider. Report any adverse effects and or reactions from the medicines to her outpatient provider promptly. Patient has been instructed & cautioned: To not engage in alcohol and or illegal drug use while on prescription medicines. In the event of worsening symptoms, patient is instructed to call the crisis hotline, 911 and or go to the nearest ED for appropriate evaluation and treatment of symptoms. To follow-up with her primary care provider for your other medical issues, concerns and or health care needs.  Signed: Laveda Abbe, NP 09/20/2020, 9:50 AM

## 2020-09-20 NOTE — Progress Notes (Addendum)
  [facility] Adult Case Management Discharge Plan :  Will you be returning to the same living situation after discharge:  Yes,  apartment alone At discharge, do you have transportation home?: Yes,  mother Do you have the ability to pay for your medications: Yes,  Medicaid  Release of information consent forms completed and emailed to Medical Records, then turned in to Medical Records by CSW.   Patient to Follow up at:  Follow-up Information    [facility]. Call.   Why: A referral has been made on your behalf to this provider for  ACTT team services.  Please contact them at discharge to discuss.  If the ACTT Team does not start with you prior to injection being due on 4/11, please see the regular outpatient clinic. Contact information: [place] [address] [phone]        [facility]. Go on 09/24/2020.   Why: You have an appointment on 09/24/20 at 12:00 pm to establish care with this provider for primary care services.  This appointment will be held in person. Contact information: [address] [place]  [address] [phone]              Next level of care provider has access to [place] and Suicide Prevention discussed: Yes,  with mother  Mother was also called prior to discharge to arrange for transportation and explain about the need for a Risperdal injection on 4/11 -- she and patient were advised that should the [place] Team not be active with patient by then, they should contact the regular [facility] clinic for an appointment.  They were in agreement.  Have you used any form of tobacco in the last 30 days? (Cigarettes, Smokeless Tobacco, Cigars, and/or Pipes): No  Has patient been referred to the Quitline?: N/A patient is not a smoker  Patient has been referred for addiction treatment: N/A  Prior to discharge, doctor and CSW met with patient to discuss the fact that  her ex-boyfriend has taken her son at this point, and what options she has with regard to trying to get him back.  We advised strongly that she remain complaint with her medication regimen, as that will be helpful to her if she has to testify to a judge at any point about the custody arrangement.  We also advised that it could go negatively if she just shows up at ex-boyfriend's house trying to take her son, and that she might do better to try to talk to him by text or phone first, to let him know she is out of the hospital.  If the ex-boyfriend has any custody paperwork he can provide it to her at that point, or she can go to a magistrate to discuss what she can do to get the current custody agreement enforced.  Maretta Los, LCSW 09/20/2020, 11:26 AM

## 2020-09-20 NOTE — [facility] Group Notes (Signed)
LCSW Group Therapy Note  09/20/2020    10:00-11:00am   Topic:  Anger Healthy and Unhealthy Coping Skills  Participation Level:  Did Not Attend  Description of Group:   In this group, patients identified their own common triggers and typical reactions then analyzed how these reactions are possibly beneficial and possibly unhelpful.  Focus was placed on examining whether typical coping skills are healthy or unhealthy.  Therapeutic Goals: 1. Patients will share situations that commonly incite their anger and how they typically respond 2. Patients will identify how their coping skills work for them and/or against them 3. Patients will explore possible alternative coping skills 4. Patients will learn that anger itself is normal and that healthier reactions can assist with resolving conflict rather than worsening situations  Summary of Patient Progress:  N/A  Therapeutic Modalities:   Cognitive Behavioral Therapy Processing  Jahden Schara J Grossman-Orr  

## 2020-09-20 NOTE — Progress Notes (Signed)
RN met with pt and reviewed pt's discharge instructions. Pt verbalized understanding of discharge instructions and pt did not have any questions. RN reviewed and provided pt with a copy of SRA, AVS and Transition Record. RN returned pt's belongings to pt.  Paper prescriptions were given to pt. Pt denied SI/HI/AVH and voiced no concerns. Pt was appreciative of the care pt received at [facility]. Patient discharged to the lobby without incident.  ?

## 2020-09-20 NOTE — Progress Notes (Signed)
   09/20/20 0500  Sleep  Number of Hours 5.75

## 2020-09-23 NOTE — Telephone Encounter (Signed)
Pt has appt for 09/24/20 and ppw was handed off to providers nurse yesterday

## 2020-09-24 ENCOUNTER — Ambulatory Visit: Payer: Self-pay | Admitting: Nurse Practitioner

## 2020-10-17 ENCOUNTER — Ambulatory Visit: Payer: Medicaid Other | Admitting: Nurse Practitioner

## 2020-10-23 ENCOUNTER — Telehealth: Payer: Self-pay

## 2020-10-23 NOTE — Telephone Encounter (Signed)
We will not be seeing PT at [facility]

## 2021-03-03 ENCOUNTER — Other Ambulatory Visit (HOSPITAL_COMMUNITY)
Admission: RE | Admit: 2021-03-03 | Discharge: 2021-03-03 | Disposition: A | Discharge status: Home / Self Care | Payer: Medicaid Other | Source: Ambulatory Visit | Attending: Nurse Practitioner | Admitting: Nurse Practitioner

## 2021-03-03 DIAGNOSIS — R87610 Atypical squamous cells of undetermined significance on cytologic smear of cervix (ASC-US): Secondary | ICD-10-CM | POA: Insufficient documentation

## 2021-03-06 LAB — SURGICAL PATHOLOGY

## 2022-03-22 DIAGNOSIS — R69 Illness, unspecified: Secondary | ICD-10-CM | POA: Diagnosis not present

## 2022-03-23 DIAGNOSIS — R69 Illness, unspecified: Secondary | ICD-10-CM | POA: Diagnosis not present

## 2022-04-12 DIAGNOSIS — R69 Illness, unspecified: Secondary | ICD-10-CM | POA: Diagnosis not present

## 2022-04-16 DIAGNOSIS — R69 Illness, unspecified: Secondary | ICD-10-CM | POA: Diagnosis not present

## 2022-04-20 DIAGNOSIS — J45909 Unspecified asthma, uncomplicated: Secondary | ICD-10-CM | POA: Diagnosis not present

## 2022-04-20 DIAGNOSIS — R0602 Shortness of breath: Secondary | ICD-10-CM | POA: Diagnosis not present

## 2022-04-20 DIAGNOSIS — N911 Secondary amenorrhea: Secondary | ICD-10-CM | POA: Diagnosis not present

## 2022-04-20 DIAGNOSIS — E119 Type 2 diabetes mellitus without complications: Secondary | ICD-10-CM | POA: Diagnosis not present

## 2022-04-27 DIAGNOSIS — R69 Illness, unspecified: Secondary | ICD-10-CM | POA: Diagnosis not present

## 2022-04-27 DIAGNOSIS — G2401 Drug induced subacute dyskinesia: Secondary | ICD-10-CM | POA: Diagnosis not present

## 2022-04-27 DIAGNOSIS — F332 Major depressive disorder, recurrent severe without psychotic features: Secondary | ICD-10-CM | POA: Diagnosis not present

## 2022-04-30 DIAGNOSIS — R69 Illness, unspecified: Secondary | ICD-10-CM | POA: Diagnosis not present

## 2022-05-07 DIAGNOSIS — Z743 Need for continuous supervision: Secondary | ICD-10-CM | POA: Diagnosis not present

## 2022-05-07 DIAGNOSIS — I499 Cardiac arrhythmia, unspecified: Secondary | ICD-10-CM | POA: Diagnosis not present

## 2022-05-07 DIAGNOSIS — I959 Hypotension, unspecified: Secondary | ICD-10-CM | POA: Diagnosis not present

## 2022-05-07 DIAGNOSIS — R1084 Generalized abdominal pain: Secondary | ICD-10-CM | POA: Diagnosis not present

## 2022-05-07 DIAGNOSIS — R9431 Abnormal electrocardiogram [ECG] [EKG]: Secondary | ICD-10-CM | POA: Diagnosis not present

## 2022-05-18 DIAGNOSIS — G2401 Drug induced subacute dyskinesia: Secondary | ICD-10-CM | POA: Diagnosis not present

## 2022-05-18 DIAGNOSIS — R69 Illness, unspecified: Secondary | ICD-10-CM | POA: Diagnosis not present

## 2022-05-18 DIAGNOSIS — F332 Major depressive disorder, recurrent severe without psychotic features: Secondary | ICD-10-CM | POA: Diagnosis not present

## 2022-06-09 ENCOUNTER — Encounter: Payer: Self-pay | Admitting: Psychiatry

## 2022-06-09 ENCOUNTER — Other Ambulatory Visit: Payer: Self-pay | Admitting: Psychiatry

## 2022-06-09 DIAGNOSIS — E221 Hyperprolactinemia: Secondary | ICD-10-CM

## 2022-07-13 ENCOUNTER — Ambulatory Visit
Admission: RE | Admit: 2022-07-13 | Discharge: 2022-07-13 | Disposition: A | Discharge status: Home / Self Care | Payer: Medicaid Other | Source: Ambulatory Visit | Attending: Psychiatry | Admitting: Psychiatry

## 2022-07-13 DIAGNOSIS — E221 Hyperprolactinemia: Secondary | ICD-10-CM

## 2022-07-13 MED ORDER — GADOPICLENOL 0.5 MMOL/ML IV SOLN
10.0000 mL | Freq: Once | INTRAVENOUS | Status: AC | PRN
  Administered 2022-07-13: 10 mL via INTRAVENOUS

## 2022-07-21 ENCOUNTER — Encounter (HOSPITAL_BASED_OUTPATIENT_CLINIC_OR_DEPARTMENT_OTHER): Payer: Self-pay

## 2022-07-21 DIAGNOSIS — R0683 Snoring: Secondary | ICD-10-CM

## 2022-08-20 ENCOUNTER — Encounter: Payer: Medicaid Other | Admitting: Pulmonary Disease

## 2022-09-03 ENCOUNTER — Encounter: Payer: Self-pay | Admitting: "Endocrinology

## 2022-09-20 ENCOUNTER — Ambulatory Visit: Payer: Medicaid Other | Admitting: "Endocrinology

## 2022-10-14 ENCOUNTER — Encounter: Payer: Self-pay | Admitting: "Endocrinology

## 2022-11-23 ENCOUNTER — Ambulatory Visit: Payer: Medicaid Other | Admitting: "Endocrinology

## 2022-11-30 DIAGNOSIS — F332 Major depressive disorder, recurrent severe without psychotic features: Secondary | ICD-10-CM | POA: Diagnosis not present

## 2022-11-30 DIAGNOSIS — F209 Schizophrenia, unspecified: Secondary | ICD-10-CM | POA: Diagnosis not present

## 2022-11-30 DIAGNOSIS — G2401 Drug induced subacute dyskinesia: Secondary | ICD-10-CM | POA: Diagnosis not present

## 2022-12-09 ENCOUNTER — Institutional Professional Consult (permissible substitution): Payer: Medicaid Other | Admitting: Plastic Surgery

## 2022-12-28 ENCOUNTER — Ambulatory Visit
Admission: EM | Admit: 2022-12-28 | Discharge: 2022-12-28 | Disposition: A | Discharge status: Home / Self Care | Payer: MEDICAID

## 2022-12-28 DIAGNOSIS — S46912A Strain of unspecified muscle, fascia and tendon at shoulder and upper arm level, left arm, initial encounter: Secondary | ICD-10-CM | POA: Diagnosis not present

## 2022-12-28 MED ORDER — NAPROXEN 500 MG PO TABS: 500.0000 mg | ORAL_TABLET | Freq: Two times a day (BID) | ORAL | 0 refills | Status: DC | PRN

## 2022-12-28 MED ORDER — TIZANIDINE HCL 2 MG PO CAPS: 2.0000 mg | ORAL_CAPSULE | Freq: Three times a day (TID) | ORAL | 0 refills | Status: DC | PRN

## 2022-12-28 NOTE — ED Triage Notes (Signed)
Pt reports she has pain in her left shoulder x 1 day. Pt states one week ago she had nausea, headache, and the same shoulder pain.   Pain increases with coughing or certain movements but denies chest pain.

## 2022-12-28 NOTE — Discharge Instructions (Signed)
I have prescribed an anti-inflammatory pain medication and a muscle relaxer.  You may also apply heat, and use massage, gentle stretches to help further

## 2022-12-28 NOTE — ED Provider Notes (Signed)
[facility]    CSN: [id number] Arrival date & time: 12/28/22  1741      History   Chief Complaint No chief complaint on file.   HPI Lindsay [name] is a 32 y.o. female.   Patient presenting today with 1 day history of left shoulder pain, stiffness, worse with movement.  She denies any swelling, discoloration, radiation of pain down the arm, numbness, tingling, loss of range of motion, injury to the area, chest pain, shortness of breath, palpitations.  So far not tried anything over-the-counter for symptoms.  States her mom told her to take ibuprofen but she has not yet done so.    Past Medical History:  Diagnosis Date   Anemia    Asthma    Homozygous alpha thalassemia (HCC)    Hx of chlamydia infection    Hx of scoliosis    Pregnancy induced hypertension    Schizophrenia (HCC)     Patient Active Problem List   Diagnosis Date Noted   Schizophrenia spectrum disorder with psychotic disorder type not yet determined (HCC) 09/16/2020   Normal postpartum course 01/29/2019   S/P primary low transverse C-section 01/29/2019   Gestational hypertension 01/24/2019   Gestational hypertension, third trimester 01/24/2019   Transient hypertension 01/12/2019    Past Surgical History:  Procedure Laterality Date   CESAREAN SECTION N/A 01/25/2019   Procedure: CESAREAN SECTION;  Surgeon: Jaymes Graff, MD;  Location: [facility];  Service: Obstetrics;  Laterality: N/A;   TONSILLECTOMY      OB History     Gravida  1   Para      Term      Preterm      AB      Living         SAB      IAB      Ectopic      Multiple      Live Births               Home Medications    Prior to Admission medications   Medication Sig Start Date End Date Taking? Authorizing Provider  ACCU-CHEK GUIDE test strip USE TO CHECK SUGAR TWICE DAILY 11/08/22  Yes [provider]  Accu-Chek Softclix Lancets lancets 2 (two) times daily. 11/08/22  Yes [provider]   naproxen (NAPROSYN) 500 MG tablet Take 1 tablet (500 mg total) by mouth 2 (two) times daily as needed. 12/28/22  Yes Particia Nearing, PA-C  tizanidine (ZANAFLEX) 2 MG capsule Take 1 capsule (2 mg total) by mouth 3 (three) times daily as needed for muscle spasms. Do not drink alcohol or drive while taking this medication.  May cause drowsiness. 12/28/22  Yes Particia Nearing, PA-C  VENTOLIN HFA 108 [phone] Base) MCG/ACT inhaler SMARTSIG:1 Puff(s) Via Inhaler Every 4 Hours PRN 09/02/22  Yes [provider]  benztropine (COGENTIN) 0.5 MG tablet Take 1 tablet (0.5 mg total) by mouth 2 (two) times daily. 09/20/20   Laveda Abbe, NP  buPROPion (WELLBUTRIN XL) 150 MG 24 hr tablet Take 150 mg by mouth every morning.    [provider]  hydrOXYzine (ATARAX) 50 MG tablet SMARTSIG:1 Tablet(s) By Mouth 1-3 Times Daily PRN    [provider]  metFORMIN (GLUCOPHAGE) 500 MG tablet Take 500 mg by mouth daily.    [provider]  risperiDONE (RISPERDAL M-TABS) 3 MG disintegrating tablet Take 1 tablet (3 mg total) by mouth 2 (two) times daily. 09/20/20  Laveda Abbe, NP  traZODone (DESYREL) 50 MG tablet Take 1 tablet (50 mg total) by mouth at bedtime. 09/20/20   Laveda Abbe, NP    Family History Family History  Problem Relation Age of Onset   Cancer Father        liver   Hypertension Mother    Diabetes Maternal Grandmother    Cancer Maternal Grandmother     Social History Social History   Tobacco Use   Smoking status: Never   Smokeless tobacco: Never  Vaping Use   Vaping Use: Never used  Substance Use Topics   Alcohol use: No   Drug use: No     Allergies   Patient has no known allergies.   Review of Systems Review of Systems Per HPI  Physical Exam Triage Vital Signs ED Triage Vitals  Enc Vitals Group     BP 12/28/22 1758 (!) 148/86     Pulse Rate 12/28/22 1758 98     Resp 12/28/22 1758 (!) 22     Temp 12/28/22 1758 98.3  F (36.8 C)     Temp Source 12/28/22 1758 Oral     SpO2 12/28/22 1758 95 %     Weight --      Height --      Head Circumference --      Peak Flow --      Pain Score 12/28/22 1759 0     Pain Loc --      Pain Edu? --      Excl. in GC? --    No data found.  Updated Vital Signs BP (!) 148/86 (BP Location: Right Arm)   Pulse 98   Temp 98.3 F (36.8 C) (Oral)   Resp (!) 22   LMP 08/22/2020 Comment: pt states respirdone stops period  SpO2 95%   Visual Acuity Right Eye Distance:   Left Eye Distance:   Bilateral Distance:    Right Eye Near:   Left Eye Near:    Bilateral Near:     Physical Exam Vitals and nursing note reviewed.  Constitutional:      Appearance: Normal appearance. She is not ill-appearing.  HENT:     Head: Atraumatic.  Eyes:     Extraocular Movements: Extraocular movements intact.     Conjunctiva/sclera: Conjunctivae normal.  Cardiovascular:     Rate and Rhythm: Normal rate and regular rhythm.     Heart sounds: Normal heart sounds.  Pulmonary:     Effort: Pulmonary effort is normal.     Breath sounds: Normal breath sounds.  Musculoskeletal:        General: Tenderness present. No swelling, deformity or signs of injury. Normal range of motion.     Cervical back: Normal range of motion and neck supple.     Comments: Mildly tender to palpation left trapezius muscle extending to shoulder  Skin:    General: Skin is warm and dry.     Findings: No bruising or erythema.  Neurological:     Mental Status: She is alert and oriented to person, place, and time.     Motor: No weakness.     Gait: Gait normal.     Comments: Left upper extremity neurovascularly intact  Psychiatric:        Mood and Affect: Mood normal.        Thought Content: Thought content normal.        Judgment: Judgment normal.      UC Treatments / Results  Labs (all labs ordered are listed, but only abnormal results are displayed) Labs Reviewed - No data to  display  EKG   Radiology No results found.  Procedures Procedures (including critical care time)  Medications Ordered in UC Medications - No data to display  Initial Impression / Assessment and Plan / UC Course  I have reviewed the triage vital signs and the nursing notes.  Pertinent labs & imaging results that were available during my care of the patient were reviewed by me and considered in my medical decision making (see chart for details).     Consistent with muscular strain, treat with naproxen, Zanaflex, heat, stretches, massage.  Return for worsening symptoms.  Final Clinical Impressions(s) / UC Diagnoses   Final diagnoses:  Strain of left shoulder, initial encounter     Discharge Instructions      I have prescribed an anti-inflammatory pain medication and a muscle relaxer.  You may also apply heat, and use massage, gentle stretches to help further    ED Prescriptions     Medication Sig Dispense Auth. Provider   naproxen (NAPROSYN) 500 MG tablet Take 1 tablet (500 mg total) by mouth 2 (two) times daily as needed. 20 tablet Particia Nearing, [place]   tizanidine (ZANAFLEX) 2 MG capsule Take 1 capsule (2 mg total) by mouth 3 (three) times daily as needed for muscle spasms. Do not drink alcohol or drive while taking this medication.  May cause drowsiness. 15 capsule Particia Nearing, [place]      PDMP not reviewed this encounter.   Particia Nearing, [place] 12/28/22 [phone]

## 2022-12-31 ENCOUNTER — Other Ambulatory Visit: Payer: Self-pay | Admitting: Family Medicine

## 2023-01-15 DIAGNOSIS — Z880 Allergy status to penicillin: Secondary | ICD-10-CM | POA: Diagnosis not present

## 2023-01-15 DIAGNOSIS — I10 Essential (primary) hypertension: Secondary | ICD-10-CM | POA: Diagnosis not present

## 2023-01-15 DIAGNOSIS — H5789 Other specified disorders of eye and adnexa: Secondary | ICD-10-CM | POA: Diagnosis not present

## 2023-01-15 DIAGNOSIS — Z79899 Other long term (current) drug therapy: Secondary | ICD-10-CM | POA: Diagnosis not present

## 2023-01-15 DIAGNOSIS — F32A Depression, unspecified: Secondary | ICD-10-CM | POA: Diagnosis not present

## 2023-01-15 DIAGNOSIS — F419 Anxiety disorder, unspecified: Secondary | ICD-10-CM | POA: Diagnosis not present

## 2023-01-15 DIAGNOSIS — R42 Dizziness and giddiness: Secondary | ICD-10-CM | POA: Diagnosis not present

## 2023-01-15 DIAGNOSIS — Z743 Need for continuous supervision: Secondary | ICD-10-CM | POA: Diagnosis not present

## 2023-02-01 ENCOUNTER — Encounter: Payer: Self-pay | Admitting: "Endocrinology

## 2023-02-01 ENCOUNTER — Ambulatory Visit (INDEPENDENT_AMBULATORY_CARE_PROVIDER_SITE_OTHER): Payer: 59 | Admitting: "Endocrinology

## 2023-02-01 VITALS — BP 108/66 | HR 88 | Ht 63.0 in | Wt 343.4 lb

## 2023-02-01 DIAGNOSIS — E221 Hyperprolactinemia: Secondary | ICD-10-CM

## 2023-02-01 DIAGNOSIS — L83 Acanthosis nigricans: Secondary | ICD-10-CM | POA: Diagnosis not present

## 2023-02-01 DIAGNOSIS — R7303 Prediabetes: Secondary | ICD-10-CM | POA: Diagnosis not present

## 2023-02-01 DIAGNOSIS — E782 Mixed hyperlipidemia: Secondary | ICD-10-CM | POA: Diagnosis not present

## 2023-02-01 MED ORDER — CABERGOLINE 0.5 MG PO TABS: 0.5000 mg | ORAL_TABLET | ORAL | 1 refills | Status: DC

## 2023-02-01 NOTE — Progress Notes (Signed)
Endocrinology Consult Note                                            02/01/2023, 12:33 PM   Subjective:    Patient ID: Lindsay [name], female    DOB: December 01, 1990, PCP Bucio, Julian Reil, FNP   Past Medical History:  Diagnosis Date   Anemia    Asthma    Homozygous alpha thalassemia (HCC)    Hx of chlamydia infection    Hx of scoliosis    Hyperlipidemia    Pregnancy induced hypertension    Schizophrenia (HCC)    Past Surgical History:  Procedure Laterality Date   CESAREAN SECTION N/A 01/25/2019   Procedure: CESAREAN SECTION;  Surgeon: Jaymes Graff, MD;  Location: [facility];  Service: Obstetrics;  Laterality: N/A;   TONSILLECTOMY     Social History   Socioeconomic History   Marital status: Single    Spouse name: Not on file   Number of children: Not on file   Years of education: Not on file   Highest education level: Not on file  Occupational History   Not on file  Tobacco Use   Smoking status: Never   Smokeless tobacco: Never  Vaping Use   Vaping status: Never Used  Substance and Sexual Activity   Alcohol use: No   Drug use: No   Sexual activity: Yes    Birth control/protection: None  Other Topics Concern   Not on file  Social History Narrative   Not on file   Social Determinants of Health   Financial Resource Strain: Low Risk  (01/18/2019)   Overall Financial Resource Strain (CARDIA)    Difficulty of Paying Living Expenses: Not hard at all  Food Insecurity: Food Insecurity Present (01/24/2019)   Hunger Vital Sign    Worried About Running Out of Food in the Last Year: Often true    Ran Out of Food in the Last Year: Often true  Transportation Needs: Unknown (01/18/2019)   PRAPARE - Administrator, Civil Service (Medical): No    Lack of Transportation (Non-Medical): Not on file  Physical Activity: Not on file  Stress: No Stress Concern Present (01/18/2019)   [employer] of Occupational Health - Occupational Stress Questionnaire    Feeling  of Stress : Not at all  Social Connections: Not on file   Family History  Problem Relation Age of Onset   Thyroid disease Mother    Hypertension Mother    Cancer Father        liver   Diabetes Maternal Grandmother    Cancer Maternal Grandmother    Outpatient Encounter Medications as of 02/01/2023  Medication Sig   [START ON 02/03/2023] cabergoline (DOSTINEX) 0.5 MG tablet Take 1 tablet (0.5 mg total) by mouth 2 (two) times a week.   naproxen (NAPROSYN) 500 MG tablet Take 1 tablet (500 mg total) by mouth 2 (two) times daily as needed.   ACCU-CHEK GUIDE test strip USE TO CHECK SUGAR TWICE DAILY   Accu-Chek Softclix Lancets lancets 2 (two) times daily.   buPROPion (WELLBUTRIN XL) 150 MG 24 hr tablet Take 150 mg by mouth every morning.   hydrOXYzine (ATARAX) 50 MG tablet SMARTSIG:1 Tablet(s) By Mouth 1-3 Times Daily PRN   metFORMIN (GLUCOPHAGE) 500 MG tablet Take 500 mg by mouth daily.   risperiDONE (  RISPERDAL M-TABS) 3 MG disintegrating tablet Take 1 tablet (3 mg total) by mouth 2 (two) times daily.   tiZANidine (ZANAFLEX) 2 MG tablet Take 1 tablet (2 mg total) by mouth every 8 (eight) hours as needed for muscle spasms. (Patient not taking: Reported on 02/01/2023)   VENTOLIN HFA 108 (90 Base) MCG/ACT inhaler SMARTSIG:1 Puff(s) Via Inhaler Every 4 Hours PRN   [DISCONTINUED] benztropine (COGENTIN) 0.5 MG tablet Take 1 tablet (0.5 mg total) by mouth 2 (two) times daily. (Patient not taking: Reported on 02/01/2023)   [DISCONTINUED] traZODone (DESYREL) 50 MG tablet Take 1 tablet (50 mg total) by mouth at bedtime. (Patient not taking: Reported on 02/01/2023)   No facility-administered encounter medications on file as of 02/01/2023.   ALLERGIES: Allergies  Allergen Reactions   Penicillins     VACCINATION STATUS: There is no immunization history for the selected administration types on file for this patient.  HPI Lindsay [name] is 32 y.o. female who presents today with a medical history as  above. she is being seen in consultation for hyperprolactinemia requested by Bucio, Julian Reil, FNP.  Patient is not an optimal historian.  History is obtained from the patient as well as chart review. She was found to have hyperprolactinemia for at least 2 years now.  Patient reports oligomenorrhea, and galactorrhea.  She denies visual field deficit nor headaches.  She underwent MRI in January 2024 which was negative for any pituitary mass lesions. Her prolactin level has been as high as 213 in 2020, most recently 81.6 in November 2023. Patient has been on Risperdal for the duration that hyperprolactinemia she dealt with.   Her goal is to control her galactorrhea. Her other medical problems include morbid obesity, snoring, hyperlipidemia, prediabetes, schizophrenia. She relates that she is benefiting from Risperdal significantly and does not wish to come off of it. She would like to achieve some weight loss.   Review of Systems  Constitutional: + Progressively gaining weight,  + fatigue, no subjective hyperthermia, no subjective hypothermia Eyes: no blurry vision, no xerophthalmia ENT: no sore throat, no nodules palpated in throat, no dysphagia/odynophagia, no hoarseness Cardiovascular: no Chest Pain, no Shortness of Breath, no palpitations, no leg swelling Respiratory: no cough, no shortness of breath Gastrointestinal: no Nausea/Vomiting/Diarhhea Musculoskeletal: no muscle/joint aches Skin: no rashes Neurological: no tremors, no numbness, no tingling, no dizziness Psychiatric: no depression, no anxiety  Objective:       02/01/2023    9:50 AM 12/28/2022    5:58 PM 09/20/2020    6:36 AM  Vitals with BMI  Height 5\' 3"     Weight 343 lbs 6 oz    BMI 60.85    Systolic 108 148   Diastolic 66 86   Pulse 88 98      Information is confidential and restricted. Go to Review Flowsheets to unlock data.    BP 108/66   Pulse 88   Ht 5\' 3"  (1.6 m)   Wt (!) 343 lb 6.4 oz (155.8 kg)   BMI 60.83  kg/m   Wt Readings from Last 3 Encounters:  02/01/23 (!) 343 lb 6.4 oz (155.8 kg)  01/24/19 (!) 314 lb 13.1 oz (142.8 kg)  01/17/19 (!) 314 lb 12.8 oz (142.8 kg)    Physical Exam  Constitutional:  Body mass index is 60.83 kg/m.,  not in acute distress, normal state of mind Eyes: PERRLA, EOMI, no exophthalmos ENT: moist mucous membranes, no gross thyromegaly, no gross cervical lymphadenopathy Cardiovascular: normal precordial activity, Regular  Rate and Rhythm, no Murmur/Rubs/Gallops Respiratory:  adequate breathing efforts, no gross chest deformity, Clear to auscultation bilaterally Gastrointestinal: abdomen soft, Non -tender, No distension, Bowel Sounds present, no gross organomegaly Musculoskeletal: no gross deformities, strength intact in all four extremities, no peripheral edema Skin: moist, warm, no rashes, + extensive acanthosis nigricans. Neurological: no tremor with outstretched hands, Deep tendon reflexes normal in bilateral lower extremities.  CMP ( most recent) CMP     Component Value Date/Time   NA 136 09/17/2020 0927   K 4.0 09/17/2020 0927   CL 107 09/17/2020 0927   CO2 22 09/17/2020 0927   GLUCOSE 99 09/17/2020 0927   BUN 12 09/17/2020 0927   CREATININE 1.11 (H) 09/17/2020 0927   CALCIUM 9.2 09/17/2020 0927   PROT 7.1 09/17/2020 0927   ALBUMIN 3.8 09/17/2020 0927   AST 17 09/17/2020 0927   ALT 16 09/17/2020 0927   ALKPHOS 53 09/17/2020 0927   BILITOT 0.6 09/17/2020 0927   GFRNONAA >60 09/17/2020 0927     Diabetic Labs (most recent): Lab Results  Component Value Date   HGBA1C 5.9 (H) 09/13/2020   HGBA1C 5.8 (H) 04/11/2019     Lipid Panel ( most recent) Lipid Panel     Component Value Date/Time   CHOL 180 09/13/2020 1831   TRIG 79 09/13/2020 1831   HDL 50 09/13/2020 1831   CHOLHDL 3.6 09/13/2020 1831   VLDL 16 09/13/2020 1831   LDLCALC 114 (H) 09/13/2020 1831      Lab Results  Component Value Date   TSH 1.357 09/13/2020   TSH 1.699  04/11/2019    April 21, 2022 labs: Prolactin 81.6, hemoglobin 12.3, hematocrit 39.5, BUN 10, creatinine 0.83, EGFR 97. Normal liver enzymes FSH 6.4, LH 5.8, Total cholesterol 221 triglycerides 365 HDL 43 LDL 126       Assessment & Plan:   1. Hyperprolactinemia (HCC) 2. Prediabetes 3. Mixed hyperlipidemia 4. Morbid obesity (HCC) 5. Acanthosis nigricans   - ROSMERY MARKLEY  is being seen at a kind request of Bucio, Elsa C, FNP. - I have reviewed her available  records and clinically evaluated the patient. - Based on these reviews, she has medication induced hyperprolactinemia. Review of MRI of pituitary from January 20,2024 did not reveal pituitary lesions. The Risperdal she is taking for schizoaffective disorder is likely the cause of her hyperprolactinemia which is complicated by oligomenorrhea and galactorrhea.  Patient is high risk for hyperadrenergic ovulatory dysfunction/PCOS.  Patient is also at risk for obstructive sleep apnea. Her metabolic dysfunction is remarkable that her risk for type 2 diabetes is significant.  See below. She will benefit from cabergoline to lower her prolactin and possibly regulate her menstrual cycles.  I discussed initiated cabergoline 0.5 mg p.o. twice a week with plan to repeat prolactin in 3 months. Her next labs will include:  - TSH - T4, free - Prolactin - Testosterone, Free, Total, SHBG - Comprehensive metabolic panel -Lipid panel  Regarding her metabolic dysfunction, she would benefit from lifestyle medicine.  - she acknowledges that there is a room for improvement in her food and drink choices. - Suggestion is made for her to avoid simple carbohydrates  from her diet including Cakes, Sweet Desserts, Ice Cream, Soda (diet and regular), Sweet Tea, Candies, Chips, Cookies, Store Bought Juices, Alcohol , Artificial Sweeteners,  Coffee Creamer, and "Sugar-free" Products, Lemonade. This will help patient to have more stable blood glucose  profile and potentially avoid unintended weight gain.  The following Lifestyle Medicine  recommendations according to [employer] of Lifestyle Medicine  [facility]) were discussed and and offered to patient and she  agrees to start the journey:  A. Whole Foods, Plant-Based Nutrition comprising of fruits and vegetables, plant-based proteins, whole-grain carbohydrates was discussed in detail with the patient.   A list for source of those nutrients were also provided to the patient.  Patient will use only water or unsweetened tea for hydration. B.  The need to stay away from risky substances including alcohol, smoking; obtaining 7 to 9 hours of restorative sleep, at least 150 minutes of moderate intensity exercise weekly, the importance of healthy social connections,  and stress management techniques were discussed. C.  A full color page of  Calorie density of various food groups per pound showing examples of each food groups was provided to the patient.  -She has a statin prescription, however did not start taking it.  I have recommended that she has to start taking the medication concomitant with her lifestyle medicine and will be considered for the prescription if possible and on a later date.  - I did not initiate any new prescriptions today. - she is advised to maintain close follow up with Bucio, Julian Reil, FNP for primary care needs.   -Thank you for involving me in the care of this pleasant patient.  Time spent with the patient: 63  minutes, of which >50% was spent in  counseling her about her hyperprolactinemia, metabolic dysfunction, and the rest in obtaining information about her symptoms, reviewing her previous labs/studies ( including abstractions from other facilities),  evaluations, and treatments,  and developing a plan to confirm diagnosis and long term treatment based on the latest standards of care/guidelines; and documenting her care.  Lindsay [name] participated in the discussions,  expressed understanding, and voiced agreement with the above plans.  All questions were answered to her satisfaction. she is encouraged to contact clinic should she have any questions or concerns prior to her return visit.  Follow up plan: Return in about 3 months (around 05/04/2023) for Fasting Labs  in AM B4 8, A1c -NV.   Marquis Lunch, MD [facility] [address] [place], [place] [address] Phone: [phone]  Fax: [phone]     02/01/2023, 12:33 PM  This note was partially dictated with voice recognition software. Similar sounding words can be transcribed inadequately or may not  be corrected upon review.

## 2023-02-01 NOTE — Patient Instructions (Signed)

## 2023-05-12 DIAGNOSIS — F209 Schizophrenia, unspecified: Secondary | ICD-10-CM | POA: Diagnosis not present

## 2023-05-13 ENCOUNTER — Ambulatory Visit (HOSPITAL_COMMUNITY)
Admission: EM | Admit: 2023-05-13 | Discharge: 2023-05-13 | Disposition: A | Discharge status: Other Acute Inpatient Hospital | Payer: 59 | Attending: Behavioral Health | Admitting: Behavioral Health

## 2023-05-13 ENCOUNTER — Encounter (HOSPITAL_COMMUNITY): Payer: Self-pay | Admitting: Behavioral Health

## 2023-05-13 ENCOUNTER — Inpatient Hospital Stay (HOSPITAL_COMMUNITY)
Admission: AD | Admit: 2023-05-13 | Discharge: 2023-05-19 | DRG: 885 | Disposition: A | Discharge status: Home / Self Care | Payer: 59 | Source: Intra-hospital | Attending: Psychiatry | Admitting: Psychiatry

## 2023-05-13 DIAGNOSIS — E221 Hyperprolactinemia: Secondary | ICD-10-CM | POA: Diagnosis not present

## 2023-05-13 DIAGNOSIS — Z8349 Family history of other endocrine, nutritional and metabolic diseases: Secondary | ICD-10-CM

## 2023-05-13 DIAGNOSIS — F419 Anxiety disorder, unspecified: Secondary | ICD-10-CM | POA: Diagnosis not present

## 2023-05-13 DIAGNOSIS — Z7984 Long term (current) use of oral hypoglycemic drugs: Secondary | ICD-10-CM | POA: Diagnosis not present

## 2023-05-13 DIAGNOSIS — Z888 Allergy status to other drugs, medicaments and biological substances status: Secondary | ICD-10-CM

## 2023-05-13 DIAGNOSIS — Z87891 Personal history of nicotine dependence: Secondary | ICD-10-CM | POA: Diagnosis not present

## 2023-05-13 DIAGNOSIS — E785 Hyperlipidemia, unspecified: Secondary | ICD-10-CM | POA: Diagnosis present

## 2023-05-13 DIAGNOSIS — F209 Schizophrenia, unspecified: Principal | ICD-10-CM | POA: Insufficient documentation

## 2023-05-13 DIAGNOSIS — F4310 Post-traumatic stress disorder, unspecified: Secondary | ICD-10-CM | POA: Diagnosis present

## 2023-05-13 DIAGNOSIS — K219 Gastro-esophageal reflux disease without esophagitis: Secondary | ICD-10-CM | POA: Diagnosis present

## 2023-05-13 DIAGNOSIS — F32A Depression, unspecified: Secondary | ICD-10-CM | POA: Insufficient documentation

## 2023-05-13 DIAGNOSIS — Z880 Allergy status to penicillin: Secondary | ICD-10-CM

## 2023-05-13 DIAGNOSIS — F308 Other manic episodes: Secondary | ICD-10-CM | POA: Diagnosis present

## 2023-05-13 DIAGNOSIS — Z8249 Family history of ischemic heart disease and other diseases of the circulatory system: Secondary | ICD-10-CM

## 2023-05-13 DIAGNOSIS — Z833 Family history of diabetes mellitus: Secondary | ICD-10-CM | POA: Diagnosis not present

## 2023-05-13 DIAGNOSIS — Z6841 Body Mass Index (BMI) 40.0 and over, adult: Secondary | ICD-10-CM

## 2023-05-13 DIAGNOSIS — F250 Schizoaffective disorder, bipolar type: Secondary | ICD-10-CM | POA: Diagnosis not present

## 2023-05-13 DIAGNOSIS — Z79899 Other long term (current) drug therapy: Secondary | ICD-10-CM

## 2023-05-13 DIAGNOSIS — E119 Type 2 diabetes mellitus without complications: Secondary | ICD-10-CM | POA: Diagnosis not present

## 2023-05-13 DIAGNOSIS — Z818 Family history of other mental and behavioral disorders: Secondary | ICD-10-CM

## 2023-05-13 LAB — CBC WITH DIFFERENTIAL/PLATELET
Abs Immature Granulocytes: 0.03 10*3/uL (ref 0.00–0.07)
Basophils Absolute: 0 10*3/uL (ref 0.0–0.1)
Basophils Relative: 0 %
Eosinophils Absolute: 0.2 10*3/uL (ref 0.0–0.5)
Eosinophils Relative: 2 %
HCT: 42.5 % (ref 36.0–46.0)
Hemoglobin: 12.9 g/dL (ref 12.0–15.0)
Immature Granulocytes: 0 %
Lymphocytes Relative: 20 %
Lymphs Abs: 2 10*3/uL (ref 0.7–4.0)
MCH: 21.5 pg — ABNORMAL LOW (ref 26.0–34.0)
MCHC: 30.4 g/dL (ref 30.0–36.0)
MCV: 70.7 fL — ABNORMAL LOW (ref 80.0–100.0)
Monocytes Absolute: 0.6 10*3/uL (ref 0.1–1.0)
Monocytes Relative: 6 %
Neutro Abs: 6.9 10*3/uL (ref 1.7–7.7)
Neutrophils Relative %: 72 %
Platelets: 290 10*3/uL (ref 150–400)
RBC: 6.01 MIL/uL — ABNORMAL HIGH (ref 3.87–5.11)
RDW: 15.5 % (ref 11.5–15.5)
WBC: 9.7 10*3/uL (ref 4.0–10.5)
nRBC: 0 % (ref 0.0–0.2)

## 2023-05-13 LAB — LIPID PANEL
Cholesterol: 231 mg/dL — ABNORMAL HIGH (ref 0–200)
HDL: 48 mg/dL (ref >40)
LDL Cholesterol: 149 mg/dL — ABNORMAL HIGH (ref 0–99)
Total CHOL/HDL Ratio: 4.8 {ratio}
Triglycerides: 170 mg/dL — ABNORMAL HIGH (ref <150)
VLDL: 34 mg/dL (ref 0–40)

## 2023-05-13 LAB — COMPREHENSIVE METABOLIC PANEL
ALT: 19 U/L (ref 0–44)
AST: 18 U/L (ref 15–41)
Albumin: 3.9 g/dL (ref 3.5–5.0)
Alkaline Phosphatase: 61 U/L (ref 38–126)
Anion gap: 8 (ref 5–15)
BUN: 9 mg/dL (ref 6–20)
CO2: 26 mmol/L (ref 22–32)
Calcium: 9.6 mg/dL (ref 8.9–10.3)
Chloride: 104 mmol/L (ref 98–111)
Creatinine, Ser: 0.88 mg/dL (ref 0.44–1.00)
GFR, Estimated: >60 mL/min (ref >60)
Glucose, Bld: 92 mg/dL (ref 70–99)
Potassium: 3.7 mmol/L (ref 3.5–5.1)
Sodium: 138 mmol/L (ref 135–145)
Total Bilirubin: 0.9 mg/dL (ref <1.2)
Total Protein: 7.3 g/dL (ref 6.5–8.1)

## 2023-05-13 LAB — POCT URINE DRUG SCREEN - MANUAL ENTRY (I-SCREEN)
POC Amphetamine UR: NOT DETECTED — AB
POC Buprenorphine (BUP): NOT DETECTED
POC Cocaine UR: NOT DETECTED
POC Marijuana UR: NOT DETECTED
POC Methadone UR: NOT DETECTED
POC Methamphetamine UR: NOT DETECTED
POC Morphine: NOT DETECTED
POC Oxazepam (BZO): NOT DETECTED
POC Oxycodone UR: NOT DETECTED
POC Secobarbital (BAR): NOT DETECTED

## 2023-05-13 LAB — HEMOGLOBIN A1C
Hgb A1c MFr Bld: 6.5 % — ABNORMAL HIGH (ref 4.8–5.6)
Mean Plasma Glucose: 139.85 mg/dL

## 2023-05-13 LAB — TSH: TSH: 1.172 u[IU]/mL (ref 0.350–4.500)

## 2023-05-13 LAB — POC URINE PREG, ED: Preg Test, Ur: NEGATIVE

## 2023-05-13 MED ORDER — ALUM & MAG HYDROXIDE-SIMETH 200-200-20 MG/5ML PO SUSP: 30.0000 mL | ORAL | Status: DC | PRN

## 2023-05-13 MED ORDER — HYDROXYZINE HCL 25 MG PO TABS: 25.0000 mg | ORAL_TABLET | Freq: Three times a day (TID) | ORAL | Status: DC | PRN

## 2023-05-13 MED ORDER — HALOPERIDOL LACTATE 5 MG/ML IJ SOLN: 5.0000 mg | Freq: Three times a day (TID) | INTRAMUSCULAR | Status: DC | PRN

## 2023-05-13 MED ORDER — LORAZEPAM 2 MG/ML IJ SOLN: 2.0000 mg | Freq: Three times a day (TID) | INTRAMUSCULAR | Status: DC | PRN

## 2023-05-13 MED ORDER — RISPERIDONE 2 MG PO TABS
4.0000 mg | ORAL_TABLET | Freq: Every day | ORAL | Status: DC
  Administered 2023-05-13: 4 mg via ORAL
  Filled 2023-05-13: qty 2

## 2023-05-13 MED ORDER — ACETAMINOPHEN 325 MG PO TABS: 650.0000 mg | ORAL_TABLET | Freq: Four times a day (QID) | ORAL | Status: DC | PRN

## 2023-05-13 MED ORDER — BUPROPION HCL ER (XL) 150 MG PO TB24: 150.0000 mg | ORAL_TABLET | Freq: Every morning | ORAL | Status: DC

## 2023-05-13 MED ORDER — HYDROXYZINE HCL 25 MG PO TABS
25.0000 mg | ORAL_TABLET | Freq: Three times a day (TID) | ORAL | Status: DC | PRN
  Administered 2023-05-13 – 2023-05-18 (×7): 25 mg via ORAL
  Filled 2023-05-13 (×7): qty 1

## 2023-05-13 MED ORDER — METFORMIN HCL 500 MG PO TABS: 500.0000 mg | ORAL_TABLET | Freq: Every day | ORAL | Status: DC

## 2023-05-13 MED ORDER — MAGNESIUM HYDROXIDE 400 MG/5ML PO SUSP: 30.0000 mL | Freq: Every day | ORAL | Status: DC | PRN

## 2023-05-13 MED ORDER — TRAZODONE HCL 50 MG PO TABS
50.0000 mg | ORAL_TABLET | Freq: Every evening | ORAL | Status: DC | PRN
  Filled 2023-05-13: qty 1

## 2023-05-13 MED ORDER — DIPHENHYDRAMINE HCL 50 MG/ML IJ SOLN: 50.0000 mg | Freq: Three times a day (TID) | INTRAMUSCULAR | Status: DC | PRN

## 2023-05-13 NOTE — ED Notes (Signed)
[facility] transport called to transport to [facility]

## 2023-05-13 NOTE — ED Notes (Signed)
[facility] lab courier called to transport labs to [facility] lab

## 2023-05-13 NOTE — ED Notes (Signed)
Patient states that when she gets overwhelm she is not satisfied until she finds a solution. Patient requested that writer chart this in her chart

## 2023-05-13 NOTE — Tx Team (Signed)
Initial Treatment Plan 05/13/2023 11:50 PM Lindsay [name] [id number]    PATIENT STRESSORS: Marital or family conflict   Occupational concerns     PATIENT STRENGTHS: Capable of independent living  General fund of knowledge  Supportive family/friends    PATIENT IDENTIFIED PROBLEMS: Family Dynamics with her son's father  32 Year Old son acting out in school                   DISCHARGE CRITERIA:  Ability to meet basic life and health needs Medical problems require only outpatient monitoring Verbal commitment to aftercare and medication compliance  PRELIMINARY DISCHARGE PLAN: Attend PHP/IOP Outpatient therapy Return to previous work or school arrangements  PATIENT/FAMILY INVOLVEMENT: This treatment plan has been presented to and reviewed with the patient, Lindsay [name], and/or family member,   Lindsay Kroner, RN 05/13/2023, 11:50 PM

## 2023-05-13 NOTE — ED Notes (Signed)
Patient discharge via ambulatory with a steady gait with [facility] staff member. Respirations equal and unlabored, skin warm and dry. No acute distress noted. Belongings returned from locker 23 intact.

## 2023-05-13 NOTE — Progress Notes (Signed)
Pt was accepted to [facility] TODAY11/22/2024; Bed Assignment 508  Pt meets inpatient criteria per Loreen Freud  Attending Physician will be Dr. Phineas Inches, MD   Report can be called to: -Adult unit: [phone]  Pt can arrive after: 2100  Care Team notified: [facility] Lona Kettle, Gabriela Bautista-Aguilar,RN, Rashell McLean,RN, Bolivar Coker,LPN, Ene Ajibola,NP, Deatra Canter [place], [facility] [place], Shalon Bobbitt,NP   [place], [place] 05/13/2023 @ 8:24 PM

## 2023-05-13 NOTE — ED Provider Notes (Cosign Needed Addendum)
[name] Continuous Assessment Admission H&P  Date: 05/13/23 Patient Name: [place] [name] MRN: [id number] Chief Complaint:   Diagnoses:  Final diagnoses:  Schizophrenia, unspecified type [place] [name])    HPI: [place] [name] is a 32 year old female patient with a past psychiatric history significant for schizophrenia who presents to the [name] voluntary accompanied by her child's father [place] [name] [phone] with complaints of feeling stressed and paranoid.  Patient seen and evaluated face-to-face by this provider along with Dr. Lucianne Muss and chart reviewed. On evaluation, patient is alert and oriented x 4. Her thought process is linear with paranoia thought content. Her speech is clear and coherent at a decreased tone, slow rate, and delayed responses. She is noted to be exhibiting thought blocking on exam. She denies SI/HI/AVH. There is no objective evidence that the patient is responding to internal or external stimuli. Patient is cam and cooperative and does not appear to be in acute distress.   Patient states that she has been under a lot of stress because her son's school is saying that he is not behaving well. She states that her son is 19 years old and he is in pre-k at a private school and that the school has said that he is spitting, not listening to teachers and not focusing in class. She states that she feels like he should go back to daycare because he is not performing to their high expectations.  In addition to her feeling stressed, she states that she has been feeling down and more depressed this week. She describes her depressive symptoms as not wanting to do anything, not wanting to clean, staying on social media, not wanting to think about things, decreased concentration, and difficulty remembering things.  She denies auditory or visual hallucinations. However, she reports that when she sees someone they appear as if they are someone that she has  seen before. She states that in the past when she was psychotic she would see people that would be helping her, for example, she would think the counselor was a friend of hers.  When asked if she has noticed changes in her thought pattern and or responses. She states that she has noticed this week that her responses were slow and that she has been having trouble recalling information.  She reports fair sleep at night. She reports a decreased appetite, on average she consumes 1 meal per day. She denies drinking alcohol or using illicit drugs.  She resides alone with her 73-year-old son and shares custody with his father.  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  She receives medication management with [name] and is currently prescribed Risperdal 4 mg at bedtime and bupropion an unknown dose daily. She states that she was referred to IOP starting Monday but is unable to recall the name of the agency. She also reports taking metformin daily. She does not check blood sugars at home.   Per chart review, patient has a history of paranoid schizophrenia. Patient hospitalized at [name] from 09/12/2020 through 09/20/2020; from 04/09/2019 to 04/13/2019 and on 10/18/2017 to 11/04/2017.  With the patient's consent I spoke to her mother [place] [name] [phone]. Mrs. Marcy Salvo states that the patient has been under a lot of stress with her son's school.  She states that the patient told her that she has been taking her medication and that she took a mental health day from work. She states that  she has noticed that when the patient is talking she pauses for a long time and then starts back talking. She states that sometimes it is sporadic. She states that she noticed the patient's delayed responses for the past week. She states that the patient has had episodes in the past when she would be under a lot of stress.   Total Time spent with patient:  45 minutes  Musculoskeletal  Strength & Muscle Tone: within normal limits Gait & Station: normal Patient leans: N/A  Psychiatric Specialty Exam  Presentation General Appearance:  Appropriate for Environment  [place] Contact: Fair  Speech: Clear and Coherent  Speech Volume: Decreased  Handedness: Right   Mood and Affect  Mood: Depressed  Affect: Congruent   Thought Process  Thought Processes: Coherent  Descriptions of Associations:Intact  Orientation:Full (Time, Place and Person)  Thought Content:Paranoid Ideation  Diagnosis of Schizophrenia or Schizoaffective disorder in past: Yes  Duration of Psychotic Symptoms: Greater than six months  Hallucinations:Hallucinations: None  Ideas of Reference:Paranoia  Suicidal Thoughts:Suicidal Thoughts: No  Homicidal Thoughts:Homicidal Thoughts: No   Sensorium  Memory: Immediate Fair; Recent Fair; Remote Fair  Judgment: Intact  Insight: Present   Executive Functions  Concentration: Poor  Attention Span: Poor  Recall: Poor  Fund of Knowledge: Fair  Language: Fair   Psychomotor Activity  Psychomotor Activity: Psychomotor Activity: Decreased   Assets  Assets: Communication Skills; Desire for Improvement; Financial Resources/Insurance; Housing; Leisure Time; Physical Health; Social Support   Sleep  Sleep: Sleep: Fair Number of Hours of Sleep: 9   Nutritional Assessment (For OBS and [facility] admissions only) Has the patient had a weight loss or gain of 10 pounds or more in the last 3 months?: No Has the patient had a decrease in food intake/or appetite?: Yes Does the patient have dental problems?: No Does the patient have eating habits or behaviors that [name] be indicators of an eating disorder including binging or inducing vomiting?: No Has the patient recently lost weight without trying?: 0 Has the patient been eating poorly because of a decreased appetite?: 1 Malnutrition Screening Tool  Score: 1    Physical Exam HENT:     Head: Normocephalic.     Nose: Nose normal.  Cardiovascular:     Rate and Rhythm: Normal rate.  Pulmonary:     Effort: Pulmonary effort is normal.  Musculoskeletal:        General: Normal range of motion.     Cervical back: Normal range of motion.  Neurological:     Mental Status: She is alert and oriented to person, place, and time.    Review of Systems  Constitutional: Negative.   HENT: Negative.    Eyes: Negative.   Respiratory: Negative.    Cardiovascular: Negative.   Gastrointestinal: Negative.   Genitourinary: Negative.   Musculoskeletal: Negative.   Neurological: Negative.   Endo/Heme/Allergies: Negative.     Blood pressure (!) 130/90, pulse 82, temperature 98.8 F (37.1 C), temperature source Oral, resp. rate 17, SpO2 98%, unknown if currently breastfeeding. There is no height or weight on file to calculate BMI.  Past Psychiatric History: History of schizophrenia. Per chart review, patient has a history of paranoid schizophrenia. Patient hospitalized at [facility] from 09/12/2020 through 09/20/2020; from 04/09/2019 to 04/13/2019 and on 10/18/2017 to 11/04/2017.  Is the patient at risk to self? No  Has the patient been a risk to self in the past 6 months? No .    Has the patient been a  risk to self within the distant past? Yes   Is the patient a risk to others? No   Has the patient been a risk to others in the past 6 months? No   Has the patient been a risk to others within the distant past? No   Past Medical History: History of Noncomplicated DM  Family History: No known psychiatric history.   Social History: She resides alone with her 27-year-old son and shares custody with his father. 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 She denies drinking alcohol or using illicit drugs.  Last Labs:  No visits with results within 6 Month(s) from this visit.  Latest known  visit with results is:  Hospital Outpatient Visit on 03/03/2021  Component Date Value Ref Range Status   SURGICAL PATHOLOGY 03/03/2021    Final-Edited                   Value:Surgical Pathology CASE: [id number] PATIENT: Dawayne Cirri Surgical Pathology Report     Clinical History: 01/22/21 ASCUS HR HPV; 03/09/18 normal     FINAL MICROSCOPIC DIAGNOSIS:  A. ENDOCERVIX, CURETTAGE: -Mild squamous atypia suggestive of koilocytic effect.  GROSS DESCRIPTION:  A.  Received in formalin labeled with the patient's name and "ECC" is a scant amount of minute, tan soft tissue flecks, submitted in toto in a single cassette.  (LEF 03/05/2021)    Final Diagnosis performed by Mark [place], MD.   Electronically signed 03/06/2021 Technical component performed at [name], [address], [place], [place] [address].  Professional component performed at [employer]. [facility], [address], [place], [place] [address].  Immunohistochemistry Technical component (if applicable) was performed at [name]. [address], [place], [place] [address].   IMMUNOHISTOCHEMISTRY DISCLAIMER (if app                         licable): Some of these immunohistochemical stains [name] have been developed and the performance characteristics determine by [facility]. Some [name] not have been cleared or approved by the U.S. Food and Drug Administration. The FDA has determined that such clearance or approval is not necessary. This test is used for clinical purposes. It should not be regarded as investigational or for research. This laboratory is certified under the Clinical Laboratory Improvement Amendments of 1988 (CLIA-88) as qualified to perform high complexity clinical laboratory testing.  The controls stained appropriately.     Allergies: Penicillins  Medications:  Facility Ordered Medications  Medication   acetaminophen (TYLENOL)  tablet 650 mg   alum & mag hydroxide-simeth (MAALOX/MYLANTA) 200-200-20 MG/5ML suspension 30 mL   magnesium hydroxide (MILK OF MAGNESIA) suspension 30 mL   hydrOXYzine (ATARAX) tablet 25 mg   PTA Medications  Medication Sig   VENTOLIN HFA 108 (90 Base) MCG/ACT inhaler Inhale 1 puff into the lungs every 4 (four) hours as needed for wheezing or shortness of breath.   metFORMIN (GLUCOPHAGE) 500 MG tablet Take 500 mg by mouth daily.   Accu-Chek Softclix Lancets lancets 2 (two) times daily.   hydrOXYzine (ATARAX) 50 MG tablet Take 50 mg by mouth 3 (three) times daily as needed for anxiety.   ACCU-CHEK GUIDE test strip USE TO CHECK SUGAR TWICE DAILY   buPROPion (WELLBUTRIN XL) 150 MG 24 hr tablet Take 150 mg by mouth every morning.   naproxen (NAPROSYN) 500 MG tablet Take 1 tablet (500 mg total) by mouth  2 (two) times daily as needed. (Patient taking differently: Take 500 mg by mouth 2 (two) times daily as needed for mild pain (pain score 1-3).)   meclizine (ANTIVERT) 25 MG tablet Take 25 mg by mouth 3 (three) times daily as needed for dizziness or nausea.   ondansetron (ZOFRAN) 4 MG tablet Take 4 mg by mouth every 6 (six) hours as needed for nausea or vomiting.   Vitamin D, Ergocalciferol, (DRISDOL) 1.25 MG (50000 UNIT) CAPS capsule Take 50,000 Units by mouth once a week.   risperiDONE (RISPERDAL) 2 MG tablet Take 2 mg by mouth daily as needed.   risperidone (RISPERDAL) 4 MG tablet Take 4 mg by mouth at bedtime.   cetirizine (ZYRTEC) 10 MG tablet Take 10 mg by mouth daily as needed for allergies.      Medical Decision Making  Patient is recommended for inpatient psychiatric treatment for mood stabilization for paranoia, thought blocking and depression. Patient is voluntary.  Lab Orders         CBC with Differential/Platelet         Comprehensive metabolic panel         Hemoglobin A1c         Lipid panel         TSH         Prolactin         POC urine preg, ED         POCT Urine Drug  Screen - (I-Screen)    EKG  Meds ordered this encounter  Medications   acetaminophen (TYLENOL) tablet 650 mg   alum & mag hydroxide-simeth (MAALOX/MYLANTA) 200-200-20 MG/5ML suspension 30 mL   magnesium hydroxide (MILK OF MAGNESIA) suspension 30 mL   hydrOXYzine (ATARAX) tablet 25 mg   buPROPion (WELLBUTRIN XL) 24 hr tablet 150 mg   risperiDONE (RISPERDAL) tablet 4 mg   metFORMIN (GLUCOPHAGE) tablet 500 mg   Recommendations  Based on my evaluation the patient does not appear to have an emergency medical condition.  Layla Barter, NP 05/13/23  2:59 PM

## 2023-05-13 NOTE — Progress Notes (Signed)
   05/13/23 1244  [facility] Triage Screening (Walk-ins at [facility] only)  How Did You Hear About [place]? Self  What Is the Reason for Your Visit/Call Today? Pt presents to [facility] voluntarily unaccompanied. Pt states that she's been stressed and has caused her to feel paranoid.  How Long Has This Been Causing You Problems? 1 wk - 1 month  Have You Recently Had Any Thoughts About Hurting Yourself? No  Are You Planning to Commit Suicide/Harm Yourself At This time? No  Have you Recently Had Thoughts About Hurting Someone [name]? No  Are You Planning To Harm Someone At This Time? No  Are you currently experiencing any auditory, visual or other hallucinations? No  Have You Used Any Alcohol or Drugs in the Past 24 Hours? No  Do you have any current medical co-morbidities that require immediate attention? No  Clinician description of patient physical appearance/behavior: casually dressed, calm, cooperative  What Do You Feel Would Help You the Most Today? Social Support;Treatment for Depression or other mood problem  If access to [facility] was not available, would you have sought care in the Emergency Department? No  Determination of Need Routine (7 days)  Options For Referral Medication Management;Outpatient Therapy

## 2023-05-13 NOTE — ED Notes (Signed)
Report given to [place] RN@[facility]  adult unit

## 2023-05-13 NOTE — ED Notes (Signed)
Patient in milieu. Environment is secured. Will continue to monitor for safety. 

## 2023-05-13 NOTE — BH Assessment (Signed)
Comprehensive Clinical Assessment (CCA) Note  05/13/2023 Lindsay [name] [id number]  Disposition: Per Liborio Nixon NP, patient is recommended for inpatient treatment.   The patient demonstrates the following risk factors for suicide: Chronic risk factors for suicide include: psychiatric disorder of schizophrenia . Acute risk factors for suicide include: family or marital conflict. Protective factors for this patient include: positive social support and positive therapeutic relationship. Considering these factors, the overall suicide risk at this point appears to be low. Patient is not appropriate for outpatient follow up.  Lindsay [name] is a 32 year old female presenting to [facility] voluntarily with chief complaint of "paranoid and irrational thoughts" that has been present for the past week. Patient reports a diagnosis of schizophrenia, and it appears she has outpatient services with a Tonya May. Patient reports she is taking risperidone. Patient reports that her son is acting up and she has thoughts that her son father is making him act this way so he can get custody of him.   Patient lives with her 36-year-old son and denies access to firearms and denies legal issues. Patient denies alcohol and drug use. 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 Patient recently told her boss that she is diagnosed with schizophrenia. Patient reports a history of inpatient treatment two years ago at [facility] for psychosis.  Patient is alert, engaged and cooperative. Patient eye contact and speech is normal, however presents with some thought blocking. Patient denies SI, HI, AVH.    Chief Complaint:  Chief Complaint  Patient presents with   Stress   Visit Diagnosis: Schizophrenia, unspecified type (HCC)     CCA Screening, Triage and Referral (STR)  Patient Reported Information How did you hear about [place]? Self  What Is the Reason  for Your Visit/Call Today? Pt presents to [facility] voluntarily unaccompanied. Pt states that she's been stressed and has caused her to feel paranoid.  How Long Has This Been Causing You Problems? 1 wk - 1 month  What Do You Feel Would Help You the Most Today? Social Support; Treatment for Depression or other mood problem   Have You Recently Had Any Thoughts About Hurting Yourself? No  Are You Planning to Commit Suicide/Harm Yourself At This time? No   Flowsheet Row ED from 05/13/2023 in [facility] ED from 12/28/2022 in [facility] (Dis[facility]  C-SSRS RISK CATEGORY No Risk No Risk No Risk       Have you Recently Had Thoughts About Hurting Someone Lindsay [name]? No  Are You Planning to Harm Someone at This Time? No  Explanation: NA   Have You Used Any Alcohol or Drugs in the Past 24 Hours? No  What Did You Use and How Much? NA   Do You Currently Have a Therapist/Psychiatrist? Yes  Name of Therapist/Psychiatrist: Name of Therapist/Psychiatrist: TONYA MAY   Have You Been Recently Discharged From Any Office Practice or Programs? No  Explanation of Discharge From Practice/Program: NA     CCA Screening Triage Referral Assessment Type of Contact: Face-to-Face  Telemedicine Service Delivery:   Is this Initial or Reassessment?   Date Telepsych consult ordered in [facility]:    Time Telepsych consult ordered in [facility]:    Location of Assessment: [facility] Assessment Services  Provider Location: [facility]   Collateral Involvement: NA   Does Patient Have  a Automotive engineer Guardian? No  Legal Guardian Contact Information: NA  Copy of Legal Guardianship Form: -- (NA)  Legal Guardian Notified of Arrival: -- (NA)  Legal Guardian Notified of Pending Discharge: -- (NA)  If Minor and Not Living with Parent(s), Who has Custody? NA  Is CPS  involved or ever been involved? Never  Is APS involved or ever been involved? Never   Patient Determined To Be At Risk for Harm To Self or Others Based on Review of Patient Reported Information or Presenting Complaint? No  Method: No Plan  Availability of Means: No access or NA  Intent: Vague intent or NA  Notification Required: No need or identified person  Additional Information for Danger to Others Potential: -- (NA)  Additional Comments for Danger to Others Potential: NA  Are There Guns or Other Weapons in Your Home? No  Types of Guns/Weapons: NA  Are These Weapons Safely Secured?                            -- (NA)  Who Could Verify You Are Able To Have These Secured: NA  Do You Have any Outstanding Charges, Pending Court Dates, Parole/Probation? DENIES  Contacted To Inform of Risk of Harm To Self or Others: Family/Significant Other:    Does Patient Present under Involuntary Commitment? No    County of Residence: Other (Comment)   Patient Currently Receiving the Following Services: Medication Management   Determination of Need: Routine (7 days)   Options For Referral: Medication Management; Outpatient Therapy; Inpatient Hospitalization     CCA Biopsychosocial Patient Reported Schizophrenia/Schizoaffective Diagnosis in Past: Yes   Strengths: NA   Mental Health Symptoms Depression:   Change in energy/activity   Duration of Depressive symptoms:  Duration of Depressive Symptoms: Less than two weeks   Mania:   Change in energy/activity   Anxiety:    Worrying; Tension; Difficulty concentrating   Psychosis:   None   Duration of Psychotic symptoms:  Duration of Psychotic Symptoms: Greater than six months   Trauma:   None   Obsessions:   None   Compulsions:   None   Inattention:   None   Hyperactivity/Impulsivity:   None   Oppositional/Defiant Behaviors:   None   Emotional Irregularity:   None   Other Mood/Personality  Symptoms:   NA    Mental Status Exam Appearance and self-care  Stature:   Average   Weight:   Obese   Clothing:   Neat/clean; Age-appropriate   Grooming:   Normal   Cosmetic use:   None   Posture/gait:   Normal   Motor activity:   Not Remarkable   Sensorium  Attention:   Normal   Concentration:   Normal   Orientation:   Time; Situation; Place; Person   Recall/memory:   Normal   Affect and Mood  Affect:   Full Range   Mood:   Euthymic   Relating  Eye contact:   Normal   Facial expression:   Responsive   Attitude toward examiner:   Cooperative   Thought and Language  Speech flow:  Blocked   Thought content:   Appropriate to Mood and Circumstances   Preoccupation:   None   Hallucinations:   None   Organization:   Intact   [employer] of Knowledge:   Fair   Intelligence:   Average   Abstraction:   Normal   Judgement:  Fair   Reality Testing:   Adequate   Insight:   Fair   Decision Making:   Normal   Social Functioning  Social Maturity:   Responsible   Social Judgement:   Normal   Stress  Stressors:   Relationship; Work   Coping Ability:   Human resources officer Deficits:   None   Supports:   Family; Friends/Service system     Religion: Religion/Spirituality Are You A Religious Person?: Yes What is Your Religious Affiliation?: Christian How Might This Affect Treatment?: NA  Leisure/Recreation: Leisure / Recreation Do You Have Hobbies?: No  Exercise/Diet: Exercise/Diet Do You Exercise?: No Have You Gained or Lost A Significant Amount of Weight in the Past Six Months?: No Do You Follow a Special Diet?: No Do You Have Any Trouble Sleeping?: No   CCA Employment/Education Employment/Work Situation: Employment / Work Situation Employment Situation: Employed Work Stressors: PT HAS BEEN MISSING WORK DUE TO ILLNESS Patient's Job has Been Impacted by Current Illness: No Has  Patient ever Been in Equities trader?: No (UTA)  Education: Education Is Patient Currently Attending School?: No Did You Product manager?:  (UTA) Did You Have An Individualized Education Program (IIEP): No Did You Have Any Difficulty At School?: No Patient's Education Has Been Impacted by Current Illness: No   CCA Family/Childhood History Family and Relationship History: Family history Marital status: Single Does patient have children?: Yes How many children?: 1 How is patient's relationship with their children?: GOOD  Childhood History:  Childhood History By whom was/is the patient raised?: Both parents Did patient suffer any verbal/emotional/physical/sexual abuse as a child?: No (UTA) Did patient suffer from severe childhood neglect?: No Has patient ever been sexually abused/assaulted/raped as an adolescent or adult?: No (UTA) Was the patient ever a victim of a crime or a disaster?: No Witnessed domestic violence?: No (UTA) Has patient been affected by domestic violence as an adult?: No       CCA Substance Use Alcohol/Drug Use: Alcohol / Drug Use Pain Medications: See MAR Prescriptions: See MAR Over the Counter: See MAR History of alcohol / drug use?: No history of alcohol / drug abuse (Pt denies.)                         ASAM's:  Six Dimensions of Multidimensional Assessment  Dimension 1:  Acute Intoxication and/or Withdrawal Potential:      Dimension 2:  Biomedical Conditions and Complications:      Dimension 3:  Emotional, Behavioral, or Cognitive Conditions and Complications:     Dimension 4:  Readiness to Change:     Dimension 5:  Relapse, Continued use, or Continued Problem Potential:     Dimension 6:  Recovery/Living Environment:     ASAM Severity Score:    ASAM Recommended Level of Treatment:     Substance use Disorder (SUD)    Recommendations for Services/Supports/Treatments:    Discharge Disposition: Discharge Disposition Disposition  of Patient: Admit  DSM5 Diagnoses: Patient Active Problem List   Diagnosis Date Noted   Hyperprolactinemia (HCC) 02/01/2023   Prediabetes 02/01/2023   Mixed hyperlipidemia 02/01/2023   Morbid obesity (HCC) 02/01/2023   Acanthosis nigricans 02/01/2023   Schizophrenia spectrum disorder with psychotic disorder type not yet determined (HCC) 09/16/2020   Normal postpartum course 01/29/2019   S/P primary low transverse C-section 01/29/2019   Gestational hypertension 01/24/2019   Gestational hypertension, third trimester 01/24/2019   Transient hypertension 01/12/2019     Referrals to  Alternative Service(s): Referred to Alternative Service(s):   Place:   Date:   Time:    Referred to Alternative Service(s):   Place:   Date:   Time:    Referred to Alternative Service(s):   Place:   Date:   Time:    Referred to Alternative Service(s):   Place:   Date:   Time:     Audree Camel, [facility]

## 2023-05-13 NOTE — Plan of Care (Signed)
  Problem: Education: Goal: Knowledge of [facility] Education information/materials will improve Outcome: Progressing Goal: Emotional status will improve Outcome: Progressing Goal: Mental status will improve Outcome: Progressing Goal: Verbalization of understanding the information provided will improve Outcome: Progressing   

## 2023-05-13 NOTE — Progress Notes (Signed)
Patient is a 32 Y/O AA Female, admitted to [facility] from [facility]. She is admitted to 404 (2). Patient is A/O x 4, ambulatory. Patient admitted with diagnosis of Schizophrenia. She Presents with the C/O of Paranoia and Increased Irrational thoughts  x 1 week. Denies SI/HI/AVH. Patient states she has been having increased anxiety/depression related to her 75 y/o son acting out in school. In addition, she states she is having disagreements with her son's father, whom they share custody. She lives alone with her son. Admission assessment/Unit Protocol Review completed, consents signed. Her belongings are secured in locker. Patient given hospital approved sandwich/juice. No concerns voiced or noted.  Per Chart Review:  HPI: DARYLA KREFT is a 32 year old female patient with a past psychiatric history significant for schizophrenia who presents to the [facility] voluntary accompanied by her child's father Ethelene Browns [phone] with complaints of feeling stressed and paranoid.   Patient seen and evaluated face-to-face by this provider along with Dr. Lucianne Muss and chart reviewed. On evaluation, patient is alert and oriented x 4. Her thought process is linear with paranoia thought content. Her speech is clear and coherent at a decreased tone, slow rate, and delayed responses. She is noted to be exhibiting thought blocking on exam. She denies SI/HI/AVH. There is no objective evidence that the patient is responding to internal or external stimuli. Patient is cam and cooperative and does not appear to be in acute distress.

## 2023-05-13 NOTE — ED Notes (Addendum)
Pt admitted to obs denies SI/HI/AVH. Calm, cooperative throughout interview process. Skin assessment completed. Oriented to unit. Meal and drink offered. At currrent, pt continue to denies SI/HI/AVH.  Patient reports some paranoia Pt verbally contract for safety. Will monitor for safety.

## 2023-05-13 NOTE — ED Notes (Signed)
Patient has medication in her locker due to pharmacy being closed. Rn will notify on coming shift.

## 2023-05-13 NOTE — ED Notes (Signed)
Pt sleeping@this time breathing even and unlabored will continue to monitor for safety 

## 2023-05-14 ENCOUNTER — Other Ambulatory Visit: Payer: Self-pay

## 2023-05-14 DIAGNOSIS — F250 Schizoaffective disorder, bipolar type: Secondary | ICD-10-CM | POA: Diagnosis present

## 2023-05-14 DIAGNOSIS — F209 Schizophrenia, unspecified: Secondary | ICD-10-CM | POA: Diagnosis not present

## 2023-05-14 MED ORDER — RISPERIDONE 2 MG PO TABS
2.0000 mg | ORAL_TABLET | Freq: Once | ORAL | Status: AC
  Administered 2023-05-15: 2 mg via ORAL
  Filled 2023-05-14: qty 1

## 2023-05-14 MED ORDER — ARIPIPRAZOLE 10 MG PO TABS: 10.0000 mg | ORAL_TABLET | Freq: Every day | ORAL | Status: DC

## 2023-05-14 MED ORDER — METFORMIN HCL 500 MG PO TABS
500.0000 mg | ORAL_TABLET | Freq: Every day | ORAL | Status: DC
  Administered 2023-05-15 – 2023-05-19 (×5): 500 mg via ORAL
  Filled 2023-05-14 (×7): qty 1

## 2023-05-14 MED ORDER — ARIPIPRAZOLE 10 MG PO TABS
10.0000 mg | ORAL_TABLET | Freq: Every day | ORAL | Status: DC
  Administered 2023-05-14: 10 mg via ORAL
  Filled 2023-05-14 (×3): qty 1

## 2023-05-14 MED ORDER — RISPERIDONE 3 MG PO TABS
3.0000 mg | ORAL_TABLET | Freq: Every day | ORAL | Status: DC
  Filled 2023-05-14: qty 1

## 2023-05-14 MED ORDER — RISPERIDONE 3 MG PO TABS
3.0000 mg | ORAL_TABLET | Freq: Once | ORAL | Status: DC
  Filled 2023-05-14: qty 1

## 2023-05-14 MED ORDER — RISPERIDONE 1 MG PO TABS
1.0000 mg | ORAL_TABLET | Freq: Once | ORAL | Status: AC
  Administered 2023-05-16: 1 mg via ORAL
  Filled 2023-05-14: qty 1

## 2023-05-14 NOTE — B[facility] Group Notes (Signed)
[facility] Group Notes:  (Nursing/MHT/Case Management/Adjunct)  Date:  05/14/2023  Time:  9:52 AM  Type of Therapy:   Goals  Participation Level:  Active  Participation Quality:  Appropriate  Affect:  Appropriate  Cognitive:  Appropriate  Insight:  Good  Engagement in Group:  Engaged  Modes of Intervention:  Discussion and Orientation  Summary of Progress/Problems:  Lindsay [name] 05/14/2023, 9:52 AM

## 2023-05-14 NOTE — Progress Notes (Signed)
   05/14/23 0556  15 Minute Checks  Location Bedroom  Visual Appearance Calm  Behavior Sleeping  Sleep (Behavioral Health Patients Only)  Calculate sleep? (Click Yes once per 24 hr at 0600 safety check) Yes  Documented sleep last 24 hours 7

## 2023-05-14 NOTE — BHH Suicide Risk Assessment (Signed)
Suicide Risk Assessment  Admission Assessment    [facility] Admission Suicide Risk Assessment   Nursing information obtained from:  Patient Demographic factors:  Living alone, Adolescent or young adult Current Mental Status:  NA Loss Factors:  Loss of significant relationship Historical Factors:  Impulsivity Risk Reduction Factors:  Responsible for children under 32 years of age, Sense of responsibility to family, Employed, Positive social support  Total Time spent with patient: 45 minutes Principal Problem: Schizophrenia, unspecified type (HCC) Diagnosis:  Principal Problem:   Schizophrenia, unspecified type (HCC)   Subjective Data:   Lindsay [name] is a 32 y.o. female with a past psychiatric history of schizophrenia, bipolar disorder, anxiety, and depression. Patient initially arrived to [facility] on 11/22 for worsening stress and paranoia, and admitted to [facility] voluntarily on 11/22 for impaired functioning and stabilization of acute on chronic psychiatric conditions. 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   Continued Clinical Symptoms:  Alcohol Use Disorder Identification Test Final Score (AUDIT): 0 The "Alcohol Use Disorders Identification Test", Guidelines for Use in Primary Care, Second Edition.  World Science writer [facility]). Score between 0-7:  no or low risk or alcohol related problems. Score between 8-15:  moderate risk of alcohol related problems. Score between 16-19:  high risk of alcohol related problems. Score 20 or above:  warrants further diagnostic evaluation for alcohol dependence and treatment.   CLINICAL FACTORS:   Bipolar Disorder:   Mixed State Schizophrenia:   Command hallucinatons Less than 61 years old Paranoid or undifferentiated type More than one psychiatric diagnosis Currently Psychotic Previous Psychiatric Diagnoses and Treatments   General Appearance:  Appropriate for Environment   Eye  Contact:  Fair   Speech:  Clear and Coherent   Speech Volume:  Decreased   Handedness:  Right     Mood and Affect    Mood:  Depressed   Affect:  Congruent     Thinking     Thought Processes:  Coherent   Descriptions of Associations:  Intact   Orientation:  Full (Time, Place and Person)   Thought Content:  Paranoid Ideation   History of Schizophrenia/Schizoaffective disorder:  Yes     Duration of Psychotic Symptoms:  Greater than 6 months   Hallucinations:  None   Ideas of Reference:  Paranoia   Suicidal Thoughts:  No   Homicidal Thoughts:  No     Sensorium      Memory:  Immediate Fair; Recent Fair; Remote Fair   Judgment:  Intact   Insight:  Present     Executive Functions      Concentration:  Poor   Attention Span:  Poor   Recall:  Poor   Fund of Knowledge:  Fair   Language:  Fair     Musculoskeletal:   Strength & muscle tone: within normal limits Gait & station: normal Patient leans: N/A   Psychomotor Activity:  Decreased       Assets:  Manufacturing systems engineer; Desire for Improvement; Financial Resources/Insurance; Housing; Leisure Time; Physical Health; Social Support       Sleep:  Fair 9       Physical Exam Constitutional:      General: She is not in acute distress.    Appearance: Normal appearance. She is obese. She is not ill-appearing.  HENT:     Head: Atraumatic.  Eyes:     Extraocular Movements: Extraocular movements intact.  Pulmonary:     Effort: Pulmonary effort  is normal. No respiratory distress.  Abdominal:     General: There is no distension.  Musculoskeletal:        General: Normal range of motion.  Neurological:     Mental Status: She is alert and oriented to person, place, and time. Mental status is at baseline.    COGNITIVE FEATURES THAT CONTRIBUTE TO RISK:  None    SUICIDE RISK:  Mild: Passive suicidal ideation of limited frequency, intensity, duration, and specificity.   There are no identifiable plans, no associated intent, mild dysphoria and related symptoms, good self-control (both objective and subjective assessment), few other risk factors, and identifiable protective factors, including available and accessible social support.  PLAN OF CARE: See H&P for assessment and plan.   I certify that inpatient services furnished can reasonably be expected to improve the patient's condition.   Tomie China, MD 05/14/2023, 12:33 PM
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
## 2023-05-14 NOTE — Progress Notes (Signed)
Brynlyn is depressed and anxious but responds appropriately Active on unit interacting with peers and staff Guarded at times but attending groups and cooperative Vital signs stable Denies SI/HI/AVH

## 2023-05-14 NOTE — Group Note (Signed)
[facility] LCSW Group Therapy Note  Date/Time:    05/14/2023 10:00am-11:00am  Type of Therapy and Topic:  Group Therapy:  How To Lillard Anes and Forgive Self  Participation Level:  Did Not Attend   Description of Group:  At the request of several group members, the focus of this group was to examine our tendency to be hyper-critical of self and how we need to show ourselves grace and even forgive ourselves at times.  CSW pointed out that refusing to show ourselves grace or forgive ourselves often leads to feelings of worthlessness, hopelessness, and shame.  Patients were guided to the concept that shame is universal and is worsened by being kept "in the dark," but improved by being "brought into the light."  A variety of shaming experiences were brought up and the group was able to identify the commonality of these experiences.  Research about shame being connected to 12 different areas of "not enough" for both men and women was mentioned and patients were encouraged to start change their internal dialog to "I am enough."  A song entitled the same was played and greatly appreciated by group members.  Therapeutic Goals Identify ways in which we tend to extend grace and forgiveness to others, but refuse to do so to ourselves.   Examine different areas of life in which humans in general tend to feel they are "not enough." Talk about the frequency with which we hide our shame and how that enlarges it. Allow patients to discuss their shame out loud in order to reduce its power. Practice the affirmation "I Am Enough" through song.  Summary of Patient Progress: Patient was invited to group, did not attend.   Therapeutic Modalities Processing Psychoeducation  Ambrose Mantle, LCSW 05/14/2023, 2:48 PM
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
## 2023-05-14 NOTE — Progress Notes (Incomplete)
Psychiatric Admission Assessment Adult  Patient Identification:  Lindsay [name] Date of Evaluation:  05/14/2023 Chief Complaint:  Schizophrenia, unspecified type (HCC) [F20.9] Principal Diagnosis:  Schizophrenia, unspecified type (HCC) Diagnosis:  Principal Problem:   Schizophrenia, unspecified type (HCC)  CC:   "Experiencing a lot of stress leading to paranoid thoughts"  Lindsay [name] is a 32 y.o. female  with a past psychiatric history of schizophrenia, bipolar disorder, anxiety, and depression. Patient initially arrived to [facility] on 11/22 for worsening stress and paranoia, and admitted to [facility] voluntarily on 11/22 for impaired functioning and stabilization of acute on chronic psychiatric conditions. 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  HPI:   Per chart review, patient has a past psychiatric history significant for schizophrenia who presented to [facility] with complaints of feeling stressed and paranoid on 11/22.  No agitation protocol necessary.  No acute safety concerns. She was admitted voluntarily to [facility] later that day.  On interview, patient says that for over 6 months, she has dealt with increased worrying/stress leading to paranoid thinking.  The stress stems from both her work life and concern that she will lose custody of her young son, whom she lives with.  Recently, he has been acting out at school which triggered a paranoid thinking that the school was trying to take her son away from her.  Sometimes her paranoia is more flagrantly psychotic: l "I will see a person and think that they are someone I know in another form."  Denies visual and audible hallucinations.  Has a remarkable level of insight into her schizophrenia, is consistent with Risperdal 4 mg (with Risperdal 2 mg as needed daily) at home, sees [facility], psychiatrist, every 6 weeks for medication management.  Has been hospitalized 3 times for symptoms  related to schizophrenia -- she was first diagnosed in 2019, last psychiatric hospitalization in 2021.  She was recently planning on increasing the number of her therapy sessions to weekly when she was told to seek out a higher level of care, leading to presentation to the hospital.    Patient endorses feeling depressed more days than not over the past 2 weeks and longer, and include the following neurovegetative symptoms: Anhedonia, difficulty sleeping, difficulty concentrating, poor appetite, feelings of worthlessness, and passive SI of not wanting to wake up.  Patient takes Wellbutrin 150 mg daily for depression.  Medication trials include Zoloft.  She endorses feeling "super happy" last week for no discernible reason, sleeping 5 to 6 hours, talking faster with trouble getting words out, flight of ideas, reading the Bible more, feeling very productive, exercising twice a day -- although these symptoms were ego syntonic to her and have been less prevalent in recent days.  Patient has had manic episodes in the past, and does not believe that this met the criteria for a manic episode. Endorses history of panic attacks, none current.  Patient endorses physical/emotional/sexual trauma, for which she endorses: Avoidance, intrusions, nightmares.  Denies current substance use -- last tobacco use 2 years ago, last cannabis use 2 years ago.  No other substance use.  Patient denies current physical symptomatology.  Patient medical diagnoses include type II DM and GERD.  Patient was only recently diagnosed with diabetes last week, and was begun on metformin 500 mg at that time.  Patient currently not on any medication for GERD.  Endorses allergy to penicillin.  Follows up regularly with PCP.  Was  questionably hospitalized for vertigo at some point in the past.  No relevant surgical history.  No history of head trauma.  No history of seizure.  Social history: Lives with son, whom she has primary custody of.  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  Has received a bachelor's in social work.  Patient is currently single.  No pending legal charges.  No military history.  Family history is significant for an uncle with schizophrenia, and another aunt/uncle with unspecified mental health issues.  One uncle committed suicide.  Confirms history of violence in the family, arguments.  Does not know if they take any medications.   Psychiatric ROS: As above  Past Psychiatric History: Current psychiatrist:  Benjie Karvonen. Current therapist:  [facility]. Previous psychiatric diagnoses:  schizophrenia (2019), bipolar disorder, depression, anxiety. Current psychiatric medications:  wellbutrin 150mg  XL, risperdal 4mg  at bedtime, riperdal 2mg  at bedtime PRN. Psychiatric medication history/compliance:  Patient is compliant with her medications, has trialed zoloft. Psychiatric hospitalization(s):  2019 (first schizophrenia diagnosis), 2020, 2021. Psychotherapy history: none. Neuromodulation history: none. History of suicide (obtained from HPI): denied. History of homicide or aggression (obtained in HPI): denied.  Substance Abuse History: Alcohol:  Former, 2+ years ago. Tobacco:  Former, 2+ years ago. Cannabis:  Former, 2+ years ago. IV drug use: none. Prescription drug use: none. Other illicit drugs: none. Rehab history: none.  Past Medical History: PCP: Per chart review, follows with endo for hyperprolactinemia Medical diagnoses:  T2DM, hyperprolactinemia, GERD. Medications:  Metformin 500 mg daily. Allergies:  Penicillin. Hospitalizations: none. Surgeries:  Tonsillectomy. Trauma: none. Seizures: none.  LMP:  2 years ago. Contraceptives: denied.  Social History: Living situation:  At home with son. Education:  Bachelor's degree in social work. Occupational history:  Currently works at [employer]. Marital status:  Separated. Children:  Young son, elementary school age. Legal: none. Military: none.  Access to  firearms: none.  Family Psychiatric History: Psychiatric diagnoses:  Schizophrenia in an uncle, unspecified major mental illness in another uncle/aunt. Suicide history:  Yes, an uncle.  Violence/aggression:  Endorsed some violence and aggression during family fights.  Family Medical History: None pertinent  Total Time spent with patient: 1 hour  Is the patient at risk to self? No.  Has the patient been a risk to self in the past 6 months? No.  Has the patient been a risk to self within the distant past? Yes.     Is the patient a risk to others? No.  Has the patient been a risk to others in the past 6 months? No.  Has the patient been a risk to others within the distant past? No.   [place] Scale:  Flowsheet Row [facility] (Current[facility] Most recent reading at 05/14/2023  9:00 AM ED from 05/13/2023 in [facility] Most recent reading at 05/13/2023  4:41 PM ED from 12/28/2022 in [facility] [place] Most recent reading at 12/28/2022  6:03 PM  C-SSRS RISK CATEGORY No Risk No Risk No Risk        Tobacco Screening:  Social History   Tobacco Use  Smoking Status Never  Smokeless Tobacco Never    BH Tobacco Counseling     Are you interested in Tobacco Cessation Medications?  No, patient refused Counseled patient on smoking cessation:  Refused/Declined practical counseling Reason Tobacco Screening Not Completed: Patient Refused Screening       Social History:  Social History   Substance and  Sexual Activity  Alcohol Use No     Social History   Substance and Sexual Activity  Drug Use No    Additional Social History:      Allergies:   Allergies  Allergen Reactions   Naproxen     Vertigo   Penicillins Hives   Lab Results:  Results for orders placed or performed during the hospital encounter of 05/13/23 (from the past 48 hour(s))  CBC with Differential/Platelet      Status: Abnormal   Collection Time: 05/13/23  3:20 PM  Result Value Ref Range   WBC 9.7 4.0 - 10.5 K/uL   RBC 6.01 (H) 3.87 - 5.11 MIL/uL   Hemoglobin 12.9 12.0 - 15.0 g/dL   HCT 62.9 52.8 - 41.3 %   MCV 70.7 (L) 80.0 - 100.0 fL   MCH 21.5 (L) 26.0 - 34.0 pg   MCHC 30.4 30.0 - 36.0 g/dL   RDW 24.4 01.0 - 27.2 %   Platelets 290 150 - 400 K/uL   nRBC 0.0 0.0 - 0.2 %   Neutrophils Relative % 72 %   Neutro Abs 6.9 1.7 - 7.7 K/uL   Lymphocytes Relative 20 %   Lymphs Abs 2.0 0.7 - 4.0 K/uL   Monocytes Relative 6 %   Monocytes Absolute 0.6 0.1 - 1.0 K/uL   Eosinophils Relative 2 %   Eosinophils Absolute 0.2 0.0 - 0.5 K/uL   Basophils Relative 0 %   Basophils Absolute 0.0 0.0 - 0.1 K/uL   Immature Granulocytes 0 %   Abs Immature Granulocytes 0.03 0.00 - 0.07 K/uL    Comment: Performed at [facility], [address] [place], [place] [address]  Comprehensive metabolic panel     Status: None   Collection Time: 05/13/23  3:20 PM  Result Value Ref Range   Sodium 138 135 - 145 mmol/L   Potassium 3.7 3.5 - 5.1 mmol/L   Chloride 104 98 - 111 mmol/L   CO2 26 22 - 32 mmol/L   Glucose, Bld 92 70 - 99 mg/dL    Comment: Glucose reference range applies only to samples taken after fasting for at least 8 hours.   BUN 9 6 - 20 mg/dL   Creatinine, Ser 4.03 0.44 - 1.00 mg/dL   Calcium 9.6 8.9 - 47.4 mg/dL   Total Protein 7.3 6.5 - 8.1 g/dL   Albumin 3.9 3.5 - 5.0 g/dL   AST 18 15 - 41 U/L   ALT 19 0 - 44 U/L   Alkaline Phosphatase 61 38 - 126 U/L   Total Bilirubin 0.9 <1.2 mg/dL   GFR, Estimated >25 >95 mL/min    Comment: (NOTE) Calculated using the CKD-EPI Creatinine Equation (2021)    Anion gap 8 5 - 15    Comment: Performed at [facility], [address], [place], [place] [address]  Hemoglobin A1c     Status: Abnormal   Collection Time: 05/13/23  3:20 PM  Result Value Ref Range   Hgb A1c MFr Bld 6.5 (H) 4.8 - 5.6 %    Comment: (NOTE) Pre diabetes:           5.7%-6.4%  Diabetes:              >6.4%  Glycemic control for   <7.0% adults with diabetes    Mean Plasma Glucose 139.85 mg/dL    Comment: Performed at [facility], [address], [place], [place] [address]  Lipid panel  Status: Abnormal   Collection Time: 05/13/23  3:20 PM  Result Value Ref Range   Cholesterol 231 (H) 0 - 200 mg/dL   Triglycerides 409 (H) <150 mg/dL   HDL 48 >81 mg/dL   Total CHOL/HDL Ratio 4.8 RATIO   VLDL 34 0 - 40 mg/dL   LDL Cholesterol 191 (H) 0 - 99 mg/dL    Comment:        Total Cholesterol/HDL:CHD Risk Coronary Heart Disease Risk Table                     Men   Women  1/2 Average Risk   3.4   3.3  Average Risk       5.0   4.4  2 X Average Risk   9.6   7.1  3 X Average Risk  23.4   11.0        Use the calculated Patient Ratio above and the CHD Risk Table to determine the patient's CHD Risk.        ATP III CLASSIFICATION (LDL):  <100     mg/dL   Optimal  478-295  mg/dL   Near or Above                    Optimal  130-159  mg/dL   Borderline  621-308  mg/dL   High  >657     mg/dL   Very High Performed at [facility], [address] [place], [place] [address]   TSH     Status: None   Collection Time: 05/13/23  3:20 PM  Result Value Ref Range   TSH 1.172 0.350 - 4.500 uIU/mL    Comment: Performed by a 3rd Generation assay with a functional sensitivity of <=0.01 uIU/mL. Performed at [facility], [address], [place], [place] [address]   POC urine preg, ED     Status: Normal   Collection Time: 05/13/23  3:55 PM  Result Value Ref Range   Preg Test, Ur Negative Negative  POCT Urine Drug Screen - (I-Screen)     Status: Abnormal   Collection Time: 05/13/23  4:10 PM  Result Value Ref Range   POC Amphetamine UR None Detected (A) NONE DETECTED (Cut Off Level 1000 ng/mL)   POC Secobarbital (BAR) None Detected NONE DETECTED (Cut Off Level 300 ng/mL)   POC Buprenorphine (BUP) None Detected NONE DETECTED (Cut Off Level 10  ng/mL)   POC Oxazepam (BZO) None Detected NONE DETECTED (Cut Off Level 300 ng/mL)   POC Cocaine UR None Detected NONE DETECTED (Cut Off Level 300 ng/mL)   POC Methamphetamine UR None Detected NONE DETECTED (Cut Off Level 1000 ng/mL)   POC Morphine None Detected NONE DETECTED (Cut Off Level 300 ng/mL)   POC Methadone UR None Detected NONE DETECTED (Cut Off Level 300 ng/mL)   POC Oxycodone UR None Detected NONE DETECTED (Cut Off Level 100 ng/mL)   POC Marijuana UR None Detected NONE DETECTED (Cut Off Level 50 ng/mL)    Blood alcohol level:  Lab Results  Component Value Date   ETH <10 04/08/2019   ETH <10 10/18/2017    Metabolic disorder labs:  Lab Results  Component Value Date   HGBA1C 6.5 (H) 05/13/2023   MPG 139.85 05/13/2023   MPG 122.63 09/13/2020   Lab Results  Component Value Date   PROLACTIN 13.0 09/13/2020   PROLACTIN 219.0 (H) 04/11/2019   Lab Results  Component Value Date  CHOL 231 (H) 05/13/2023   TRIG 170 (H) 05/13/2023   HDL 48 05/13/2023   CHOLHDL 4.8 05/13/2023   VLDL 34 05/13/2023   LDLCALC 149 (H) 05/13/2023   LDLCALC 114 (H) 09/13/2020    Current Medications: Current Facility-Administered Medications  Medication Dose Route Frequency Provider Last Rate Last Admin   acetaminophen (TYLENOL) tablet 650 mg  650 mg Oral Q6H PRN Ajibola, Ene A, NP       alum & mag hydroxide-simeth (MAALOX/MYLANTA) 200-200-20 MG/5ML suspension 30 mL  30 mL Oral Q4H PRN Ajibola, Ene A, NP       haloperidol lactate (HALDOL) injection 5 mg  5 mg Intramuscular TID PRN Ajibola, Ene A, NP       And   diphenhydrAMINE (BENADRYL) injection 50 mg  50 mg Intramuscular TID PRN Ajibola, Ene A, NP       And   LORazepam (ATIVAN) injection 2 mg  2 mg Intramuscular TID PRN Ajibola, Ene A, NP       hydrOXYzine (ATARAX) tablet 25 mg  25 mg Oral TID PRN Ajibola, Ene A, NP   25 mg at 05/13/23 2304   magnesium hydroxide (MILK OF MAGNESIA) suspension 30 mL  30 mL Oral Daily PRN Ajibola, Ene A, NP        traZODone (DESYREL) tablet 50 mg  50 mg Oral QHS PRN Ajibola, Ene A, NP        PTA Medications: Medications Prior to Admission  Medication Sig Dispense Refill Last Dose   buPROPion (WELLBUTRIN XL) 150 MG 24 hr tablet Take 150 mg by mouth every morning.   05/13/2023   cetirizine (ZYRTEC) 10 MG tablet Take 10 mg by mouth daily as needed for allergies.   Past Month   hydrOXYzine (ATARAX) 50 MG tablet Take 50 mg by mouth 3 (three) times daily as needed for anxiety.   05/13/2023   ibuprofen (ADVIL) 800 MG tablet Take 800 mg by mouth 3 (three) times daily as needed for mild pain (pain score 1-3).   Past Month   metFORMIN (GLUCOPHAGE) 500 MG tablet Take 500 mg by mouth daily.   05/13/2023   risperiDONE (RISPERDAL) 2 MG tablet Take 2 mg by mouth daily as needed (For anxiety).   Past Week   risperidone (RISPERDAL) 4 MG tablet Take 4 mg by mouth at bedtime.   05/13/2023   VENTOLIN HFA 108 (90 Base) MCG/ACT inhaler Inhale 1 puff into the lungs every 4 (four) hours as needed for wheezing or shortness of breath.   Past Month   Vitamin D, Ergocalciferol, (DRISDOL) 1.25 MG (50000 UNIT) CAPS capsule Take 50,000 Units by mouth once a week.   Past Week   ACCU-CHEK GUIDE test strip USE TO CHECK SUGAR TWICE DAILY      Accu-Chek Softclix Lancets lancets 2 (two) times daily.       Psychiatric Specialty Exam:  Presentation   General Appearance:  Appropriate for Environment  Eye Contact:  Fair  Speech:  Clear and Coherent  Speech Volume:  Decreased  Handedness:  Right   Mood and Affect   Mood:  Depressed  Affect:  Congruent   Thinking   Thought Processes:  Coherent  Descriptions of Associations:  Intact  Orientation:  Full (Time, Place and Person)  Thought Content:  Paranoid Ideation  History of Schizophrenia/Schizoaffective disorder:  Yes   Duration of Psychotic Symptoms:  Greater than 6 months  Hallucinations:  None  Ideas of Reference:  Paranoia  Suicidal  Thoughts:  No  Homicidal Thoughts:  No   Sensorium    Memory:  Immediate Fair; Recent Fair; Remote Fair  Judgment:  Intact  Insight:  Present   Executive Functions    Concentration:  Poor  Attention Span:  Poor  Recall:  Poor  Fund of Knowledge:  Fair  Language:  Fair   Musculoskeletal:  Strength & muscle tone: within normal limits Gait & station: normal Patient leans: N/A  Psychomotor Activity:  Decreased    Assets:  Manufacturing systems engineer; Desire for Improvement; Financial Resources/Insurance; Housing; Leisure Time; Physical Health; Social Support    Sleep:  Fair 9    Physical Exam Constitutional:      General: She is not in acute distress.    Appearance: Normal appearance. She is obese. She is not ill-appearing.  HENT:     Head: Atraumatic.  Eyes:     Extraocular Movements: Extraocular movements intact.  Pulmonary:     Effort: Pulmonary effort is normal. No respiratory distress.  Abdominal:     General: There is no distension.  Musculoskeletal:        General: Normal range of motion.  Neurological:     Mental Status: She is alert and oriented to person, place, and time. Mental status is at baseline.    Review of Systems  Constitutional:  Negative for chills and fever.  Cardiovascular:  Negative for chest pain.  Gastrointestinal:  Negative for abdominal pain, nausea and vomiting.  Neurological:  Negative for headaches.  All other systems reviewed and are negative.  Blood pressure 130/83, pulse 96, temperature 98.3 F (36.8 C), temperature source Oral, resp. rate 20, height 5\' 3"  (1.6 m), weight (!) 155.1 kg, SpO2 97%, unknown if currently breastfeeding. Body mass index is 60.58 kg/m.   Treatment Plan Summary: Daily contact with patient to assess and evaluate symptoms and progress in treatment and Medication management   ASSESSMENT:   Diagnoses / Active Problems: Schizoaffective disorder, bipolar type, mixed  episode Posttraumatic stress disorder Hyperprolactinemia  PLAN:  Safety and Monitoring: -  VOLUNTARY  admission to inpatient psychiatric unit for safety, stabilization and treatment. - Daily contact with patient to assess and evaluate symptoms and progress in treatment - Patient's case to be discussed in multi-disciplinary team meeting -  Observation Level : q15 minute checks -  Vital signs:  q12 hours -  Precautions: suicide, elopement, and assault  2. Psychiatric Diagnoses and Treatment:    # Schizoaffective disorder, bipolar type, mixed episode # Posttraumatic stress disorder - Plan to transition patient from Risperdal to Abilify in the setting of significant side effect profile. - Start Risperdal taper: 3 mg nightly on 11/23, 2 mg nightly 11/24, 1 mg nightly 11/24 - Start Abilify 10mg  at bedtime tomorrow 11/24, already received 10mg  x1 in early PM 11/23 - The risks/benefits/side-effects/alternatives to this medication were discussed in detail with the patient and time was given for questions. The patient consents to medication trial.  - Metabolic profile and EKG monitoring obtained while on an atypical antipsychotic  BMI: 60 TSH: 1.172 Lipid panel: LDL 149 HbgA1c: 6.5 QTc: 399 Prolactin: Awaiting - Encouraged patient to participate in unit milieu and in scheduled group therapies  - Short Term Goals: Ability to maintain clinical measurements within normal limits will improve - Long Term Goals: Improvement in symptoms so as ready for discharge  Other PRNS: Anxiety, agitation, sleep, minor pain  Other labs reviewed on admission: CMP WNL, MCV of 70.7 (baseline appears to be in the high 60s/low 70s)   3.  Medical Issues Being Addressed:   # Hyperprolactinemia  -Transitioning from Risperdal to Abilify  # T2DM -  4. Discharge Planning:   - Estimated discharge date: 11/28 - 11/30 - Social work and case management to assist with discharge planning and identification of  hospital follow-up needs prior to discharge. - Discharge concerns: Need to establish a safety plan; medication compliance and effectiveness. - Discharge goals: Return home with outpatient referrals for mental health follow-up including medication management/psychotherapy.  I certify that inpatient services furnished can reasonably be expected to improve the patient's condition.    Luiz Iron, MD PGY-1, Psychiatry Residency  11/23/202412:34 PM

## 2023-05-15 DIAGNOSIS — F209 Schizophrenia, unspecified: Secondary | ICD-10-CM | POA: Diagnosis not present

## 2023-05-15 LAB — PROLACTIN: Prolactin: 75.7 ng/mL — ABNORMAL HIGH (ref 4.8–33.4)

## 2023-05-15 MED ORDER — LORAZEPAM 2 MG/ML IJ SOLN: 2.0000 mg | Freq: Three times a day (TID) | INTRAMUSCULAR | Status: DC | PRN

## 2023-05-15 MED ORDER — DIPHENHYDRAMINE HCL 50 MG/ML IJ SOLN: 50.0000 mg | Freq: Three times a day (TID) | INTRAMUSCULAR | Status: DC | PRN

## 2023-05-15 MED ORDER — HALOPERIDOL LACTATE 5 MG/ML IJ SOLN: 5.0000 mg | Freq: Three times a day (TID) | INTRAMUSCULAR | Status: DC | PRN

## 2023-05-15 MED ORDER — HALOPERIDOL LACTATE 5 MG/ML IJ SOLN: 10.0000 mg | Freq: Three times a day (TID) | INTRAMUSCULAR | Status: DC | PRN

## 2023-05-15 MED ORDER — DIPHENHYDRAMINE HCL 25 MG PO CAPS: 50.0000 mg | ORAL_CAPSULE | Freq: Three times a day (TID) | ORAL | Status: DC | PRN

## 2023-05-15 MED ORDER — HALOPERIDOL 5 MG PO TABS: 5.0000 mg | ORAL_TABLET | Freq: Three times a day (TID) | ORAL | Status: DC | PRN

## 2023-05-15 MED ORDER — ARIPIPRAZOLE 10 MG PO TABS
10.0000 mg | ORAL_TABLET | Freq: Every day | ORAL | Status: DC
Start: 2023-05-15 — End: 2023-05-17
  Administered 2023-05-15 – 2023-05-17 (×3): 10 mg via ORAL
  Filled 2023-05-15 (×4): qty 1

## 2023-05-15 NOTE — Progress Notes (Signed)
Patient requested to talk to MD before taking abilify and risperdal.  Should she take meds at night or in the morning.  Does not want to be restless at night.   Is she being taken off risperdal?

## 2023-05-15 NOTE — Group Note (Signed)
Date:  05/15/2023 Time:  4:12 PM  Group Topic/Focus:  Crisis Planning:   The purpose of this group is to help patients create a crisis plan for use upon discharge or in the future, as needed.    Participation Level:  Active  Participation Quality:  Appropriate  Affect:  Appropriate  Cognitive:  Appropriate  Insight: Appropriate  Engagement in Group:  Engaged  Modes of Intervention:  Education  Additional Comments:     Reymundo Poll 05/15/2023, 4:12 PM

## 2023-05-15 NOTE — Progress Notes (Signed)
[facility] MD Progress Note  05/15/2023 12:10 PM Lindsay [name]  MRN:  [id number]  Subjective:    Lindsay [name] is a 32 y.o. female with a past psychiatric history of schizophrenia, bipolar disorder, anxiety, and depression. Patient initially arrived to [facility] on 11/22 for worsening stress and paranoia, and admitted to [facility] voluntarily on 11/22 for impaired functioning and stabilization of acute on chronic psychiatric conditions. 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   Yesterday the psychiatry team made the following recommendations: - Start Risperdal taper: 3 mg nightly on 11/23, 2 mg nightly 11/24, 1 mg nightly 11/24 - Start Abilify 10mg  at bedtime tomorrow 11/24, already received 10mg  x1 in early PM 11/23  On assessment today, the patient is more interactive.  She has an interesting increase in her energy level, pressured speech, and she is inattentive.  She has a lot of concerns about her medications.  She did refuse the Risperdal last night, although we talked about her tapering off the Risperdal.  She reports that when she took the Abilify yesterday afternoon it did not make her tired.  Of note, she yesterday she told us that the Abilify made her tired in the past.  The patient stated that she misspoke yesterday and it was hydroxyzine in the past that made her tired.  The patient is very fixated on her medication regimen.  She reports that her concentration is better but she does not demonstrate this.  The patient would like to stay on current dose of Risperdal and Abilify, and we discussed that ideally, the patient should only be on one antipsychotic mood stabilizer unless absolutely necessary, and that she is having hyperprolactinemia secondary to the Risperdal, and therefore we should continue to taper off completely.  Patient reports that her sleep is better and nursing documents that she slept 7.5 hours last night.  Patient reports appetite  is okay.  Denies any SI or HI.  She reports continued to have paranoid thoughts about her son, but has insight into these being abnormal thoughts.  I discussed with the patient, that I am worried that her mood is continue to fluctuate significantly, yesterday more depressed, and today more mixed/hypomanic episode.  After further discussion about Risperdal taper, the patient is agreeable to continue taking Restoril at bedtime, as we taper off this medication, and to continue taking the Abilify medication.     Principal Problem: Schizoaffective disorder, bipolar type (HCC) Diagnosis: Principal Problem:   Schizoaffective disorder, bipolar type (HCC) Active Problems:   Hyperprolactinemia (HCC)  Total Time spent with patient: 20 minutes  Past Psychiatric History:  Current psychiatrist: Kenney Houseman May. Current therapist: [facility]. Previous psychiatric diagnoses: schizophrenia (2019), bipolar disorder, depression, anxiety. Current psychiatric medications: wellbutrin 150mg  XL, risperdal 4mg  at bedtime, riperdal 2mg  at bedtime PRN. Psychiatric medication history/compliance: Patient is compliant with her medications, has trialed zoloft. Psychiatric hospitalization(s): 2019 (first schizophrenia diagnosis), 2020, 2021. Psychotherapy history: none. Neuromodulation history: none. History of suicide (obtained from HPI): denied. History of homicide or aggression (obtained in HPI): denied.   Past Medical History:  Past Medical History:  Diagnosis Date   Anemia    Asthma    Homozygous alpha thalassemia (HCC)    Hx of chlamydia infection    Hx of scoliosis    Hyperlipidemia    Pregnancy induced hypertension    Schizophrenia (HCC)     Past Surgical History:  Procedure Laterality Date   CESAREAN SECTION N/A 01/25/2019  Procedure: CESAREAN SECTION;  Surgeon: Jaymes Graff, MD;  Location: [facility] LD ORS;  Service: Obstetrics;  Laterality: N/A;   TONSILLECTOMY     Family History:  Family History   Problem Relation Age of Onset   Thyroid disease Mother    Hypertension Mother    Cancer Father        liver   Diabetes Maternal Grandmother    Cancer Maternal Grandmother    Family Psychiatric  History:  Psychiatric diagnoses: Schizophrenia in an uncle, unspecified major mental illness in another uncle/aunt. Suicide history: Yes, an uncle.  Violence/aggression: Endorsed some violence and aggression during family fights.   Social History:  Social History   Substance and Sexual Activity  Alcohol Use No     Social History   Substance and Sexual Activity  Drug Use No    Social History   Socioeconomic History   Marital status: Single    Spouse name: Not on file   Number of children: Not on file   Years of education: Not on file   Highest education level: Not on file  Occupational History   Not on file  Tobacco Use   Smoking status: Never   Smokeless tobacco: Never  Vaping Use   Vaping status: Never Used  Substance and Sexual Activity   Alcohol use: No   Drug use: No   Sexual activity: Yes    Birth control/protection: None  Other Topics Concern   Not on file  Social History Narrative   Not on file   Social Determinants of Health   Financial Resource Strain: Low Risk  (01/18/2019)   Overall Financial Resource Strain (CARDIA)    Difficulty of Paying Living Expenses: Not hard at all  Food Insecurity: No Food Insecurity (05/14/2023)   Hunger Vital Sign    Worried About Running Out of Food in the Last Year: Never true    Ran Out of Food in the Last Year: Never true  Transportation Needs: No Transportation Needs (05/14/2023)   PRAPARE - Administrator, Civil Service (Medical): No    Lack of Transportation (Non-Medical): No  Physical Activity: Not on file  Stress: No Stress Concern Present (01/18/2019)   [employer] of Occupational Health - Occupational Stress Questionnaire    Feeling of Stress : Not at all  Social Connections: Not on file    Additional Social History:                           Current Medications: Current Facility-Administered Medications  Medication Dose Route Frequency Provider Last Rate Last Admin   acetaminophen (TYLENOL) tablet 650 mg  650 mg Oral Q6H PRN Ajibola, Ene A, NP       alum & mag hydroxide-simeth (MAALOX/MYLANTA) 200-200-20 MG/5ML suspension 30 mL  30 mL Oral Q4H PRN Ajibola, Ene A, NP       ARIPiprazole (ABILIFY) tablet 10 mg  10 mg Oral Daily Seung Nidiffer, MD   10 mg at 05/15/23 0908   haloperidol (HALDOL) tablet 5 mg  5 mg Oral TID PRN Phineas Inches, MD       And   diphenhydrAMINE (BENADRYL) capsule 50 mg  50 mg Oral TID PRN Yamilette Garretson, Harrold Donath, MD       haloperidol lactate (HALDOL) injection 5 mg  5 mg Intramuscular TID PRN Reghan Thul, Harrold Donath, MD       And   diphenhydrAMINE (BENADRYL) injection 50 mg  50 mg Intramuscular TID PRN Tammy Ericsson,  Harrold Donath, MD       And   LORazepam (ATIVAN) injection 2 mg  2 mg Intramuscular TID PRN Phineas Inches, MD       haloperidol lactate (HALDOL) injection 10 mg  10 mg Intramuscular TID PRN Phineas Inches, MD       And   diphenhydrAMINE (BENADRYL) injection 50 mg  50 mg Intramuscular TID PRN Phineas Inches, MD       And   LORazepam (ATIVAN) injection 2 mg  2 mg Intramuscular TID PRN Phineas Inches, MD       hydrOXYzine (ATARAX) tablet 25 mg  25 mg Oral TID PRN Ajibola, Ene A, NP   25 mg at 05/14/23 1815   magnesium hydroxide (MILK OF MAGNESIA) suspension 30 mL  30 mL Oral Daily PRN Ajibola, Ene A, NP       metFORMIN (GLUCOPHAGE) tablet 500 mg  500 mg Oral Q breakfast Tomie China, MD   500 mg at 05/15/23 0758   [START ON 05/16/2023] risperiDONE (RISPERDAL) tablet 1 mg  1 mg Oral Once Tomie China, MD       risperiDONE (RISPERDAL) tablet 2 mg  2 mg Oral Once Tomie China, MD       traZODone (DESYREL) tablet 50 mg  50 mg Oral QHS PRN Ajibola, Ene A, NP        Lab Results:  Results for orders placed  or performed during the hospital encounter of 05/13/23 (from the past 48 hour(s))  CBC with Differential/Platelet     Status: Abnormal   Collection Time: 05/13/23  3:20 PM  Result Value Ref Range   WBC 9.7 4.0 - 10.5 K/uL   RBC 6.01 (H) 3.87 - 5.11 MIL/uL   Hemoglobin 12.9 12.0 - 15.0 g/dL   HCT 16.1 09.6 - 04.5 %   MCV 70.7 (L) 80.0 - 100.0 fL   MCH 21.5 (L) 26.0 - 34.0 pg   MCHC 30.4 30.0 - 36.0 g/dL   RDW 40.9 81.1 - 91.4 %   Platelets 290 150 - 400 K/uL   nRBC 0.0 0.0 - 0.2 %   Neutrophils Relative % 72 %   Neutro Abs 6.9 1.7 - 7.7 K/uL   Lymphocytes Relative 20 %   Lymphs Abs 2.0 0.7 - 4.0 K/uL   Monocytes Relative 6 %   Monocytes Absolute 0.6 0.1 - 1.0 K/uL   Eosinophils Relative 2 %   Eosinophils Absolute 0.2 0.0 - 0.5 K/uL   Basophils Relative 0 %   Basophils Absolute 0.0 0.0 - 0.1 K/uL   Immature Granulocytes 0 %   Abs Immature Granulocytes 0.03 0.00 - 0.07 K/uL    Comment: Performed at [facility], [address], [place], [place] [address]  Comprehensive metabolic panel     Status: None   Collection Time: 05/13/23  3:20 PM  Result Value Ref Range   Sodium 138 135 - 145 mmol/L   Potassium 3.7 3.5 - 5.1 mmol/L   Chloride 104 98 - 111 mmol/L   CO2 26 22 - 32 mmol/L   Glucose, Bld 92 70 - 99 mg/dL    Comment: Glucose reference range applies only to samples taken after fasting for at least 8 hours.   BUN 9 6 - 20 mg/dL   Creatinine, Ser 6.21 0.44 - 1.00 mg/dL   Calcium 9.6 8.9 - 30.8 mg/dL   Total Protein 7.3 6.5 - 8.1 g/dL   Albumin 3.9 3.5 - 5.0 g/dL   AST 18 15 - 41  U/L   ALT 19 0 - 44 U/L   Alkaline Phosphatase 61 38 - 126 U/L   Total Bilirubin 0.9 <1.2 mg/dL   GFR, Estimated >84 >13 mL/min    Comment: (NOTE) Calculated using the CKD-EPI Creatinine Equation (2021)    Anion gap 8 5 - 15    Comment: Performed at [facility], [address], [place], [place] [address]  Hemoglobin A1c     Status: Abnormal   Collection Time: 05/13/23  3:20 PM   Result Value Ref Range   Hgb A1c MFr Bld 6.5 (H) 4.8 - 5.6 %    Comment: (NOTE) Pre diabetes:          5.7%-6.4%  Diabetes:              >6.4%  Glycemic control for   <7.0% adults with diabetes    Mean Plasma Glucose 139.85 mg/dL    Comment: Performed at [facility], [address], [place], [place] [address]  Lipid panel     Status: Abnormal   Collection Time: 05/13/23  3:20 PM  Result Value Ref Range   Cholesterol 231 (H) 0 - 200 mg/dL   Triglycerides 366 (H) <150 mg/dL   HDL 48 >44 mg/dL   Total CHOL/HDL Ratio 4.8 RATIO   VLDL 34 0 - 40 mg/dL   LDL Cholesterol 034 (H) 0 - 99 mg/dL    Comment:        Total Cholesterol/HDL:CHD Risk Coronary Heart Disease Risk Table                     Men   Women  1/2 Average Risk   3.4   3.3  Average Risk       5.0   4.4  2 X Average Risk   9.6   7.1  3 X Average Risk  23.4   11.0        Use the calculated Patient Ratio above and the CHD Risk Table to determine the patient's CHD Risk.        ATP III CLASSIFICATION (LDL):  <100     mg/dL   Optimal  742-595  mg/dL   Near or Above                    Optimal  130-159  mg/dL   Borderline  638-756  mg/dL   High  >433     mg/dL   Very High Performed at [facility], [address] [place], [place] [address]   TSH     Status: None   Collection Time: 05/13/23  3:20 PM  Result Value Ref Range   TSH 1.172 0.350 - 4.500 uIU/mL    Comment: Performed by a 3rd Generation assay with a functional sensitivity of <=0.01 uIU/mL. Performed at [facility], [address] [place], [place] [address]   POC urine preg, ED     Status: Normal   Collection Time: 05/13/23  3:55 PM  Result Value Ref Range   Preg Test, Ur Negative Negative  POCT Urine Drug Screen - (I-Screen)     Status: Abnormal   Collection Time: 05/13/23  4:10 PM  Result Value Ref Range   POC Amphetamine UR None Detected (A) NONE DETECTED (Cut Off Level 1000 ng/mL)   POC Secobarbital (BAR) None Detected NONE  DETECTED (Cut Off Level 300 ng/mL)   POC Buprenorphine (BUP) None Detected NONE DETECTED (Cut Off  Level 10 ng/mL)   POC Oxazepam (BZO) None Detected NONE DETECTED (Cut Off Level 300 ng/mL)   POC Cocaine UR None Detected NONE DETECTED (Cut Off Level 300 ng/mL)   POC Methamphetamine UR None Detected NONE DETECTED (Cut Off Level 1000 ng/mL)   POC Morphine None Detected NONE DETECTED (Cut Off Level 300 ng/mL)   POC Methadone UR None Detected NONE DETECTED (Cut Off Level 300 ng/mL)   POC Oxycodone UR None Detected NONE DETECTED (Cut Off Level 100 ng/mL)   POC Marijuana UR None Detected NONE DETECTED (Cut Off Level 50 ng/mL)    Blood Alcohol level:  Lab Results  Component Value Date   ETH <10 04/08/2019   ETH <10 10/18/2017    Metabolic Disorder Labs: Lab Results  Component Value Date   HGBA1C 6.5 (H) 05/13/2023   MPG 139.85 05/13/2023   MPG 122.63 09/13/2020   Lab Results  Component Value Date   PROLACTIN 13.0 09/13/2020   PROLACTIN 219.0 (H) 04/11/2019   Lab Results  Component Value Date   CHOL 231 (H) 05/13/2023   TRIG 170 (H) 05/13/2023   HDL 48 05/13/2023   CHOLHDL 4.8 05/13/2023   VLDL 34 05/13/2023   LDLCALC 149 (H) 05/13/2023   LDLCALC 114 (H) 09/13/2020    Physical Findings: AIMS:  , ,  ,  ,    CIWA:    COWS:     Aims score zero on my exam. No eps on my exam.   Musculoskeletal: Strength & Muscle Tone: within normal limits Gait & Station: normal Patient leans: N/A  Psychiatric Specialty Exam:  Presentation  General Appearance:  Casual  Eye Contact: Fair  Speech: Pressured  Speech Volume: Normal  Handedness: Right   Mood and Affect  Mood: Anxious; Dysphoric; Irritable  Affect: Labile   Thought Process  Thought Processes: Linear  Descriptions of Associations:Intact  Orientation:Full (Time, Place and Person)  Thought Content:Logical  History of Schizophrenia/Schizoaffective disorder:No  Duration of Psychotic Symptoms:Less than  six months  Hallucinations:Hallucinations: None  Ideas of Reference:Paranoia  Suicidal Thoughts:Suicidal Thoughts: No  Homicidal Thoughts:Homicidal Thoughts: No   Sensorium  Memory: Immediate Fair; Recent Fair; Remote Fair  Judgment: Impaired  Insight: Lacking   Executive Functions  Concentration: Poor  Attention Span: Poor  Recall: Poor  Fund of Knowledge: Fair  Language: Fair   Psychomotor Activity  Psychomotor Activity: Psychomotor Activity: Normal   Assets  Assets: Communication Skills; Desire for Improvement; Financial Resources/Insurance; Housing; Leisure Time; Physical Health; Social Support   Sleep  Sleep: Sleep: Fair    Physical Exam: Physical Exam Vitals reviewed.  Constitutional:      Appearance: She is normal weight.  Pulmonary:     Effort: Pulmonary effort is normal.  Neurological:     Mental Status: She is alert.     Motor: No weakness.     Gait: Gait normal.  Psychiatric:        Behavior: Behavior normal.    Review of Systems  Constitutional:  Negative for chills and fever.  Cardiovascular:  Negative for chest pain and palpitations.  Neurological:  Negative for dizziness, tingling, tremors and headaches.  Psychiatric/Behavioral:  Positive for depression. Negative for hallucinations, memory loss, substance abuse and suicidal ideas. The patient is nervous/anxious. The patient does not have insomnia.   All other systems reviewed and are negative.  Blood pressure (!) 141/92, pulse (!) 102, temperature 98.2 F (36.8 C), temperature source Oral, resp. rate 16, height 5\' 3"  (1.6 m), weight (!) 155.1 kg,  SpO2 98%, unknown if currently breastfeeding. Body mass index is 60.58 kg/m.   Treatment Plan Summary: Daily contact with patient to assess and evaluate symptoms and progress in treatment and Medication management   ASSESSMENT:    Diagnoses / Active Problems: Schizoaffective disorder, bipolar type, mixed  episode Posttraumatic stress disorder Hyperprolactinemia   PLAN:   Safety and Monitoring: -  VOLUNTARY  admission to inpatient psychiatric unit for safety, stabilization and treatment. - Daily contact with patient to assess and evaluate symptoms and progress in treatment - Patient's case to be discussed in multi-disciplinary team meeting -  Observation Level : q15 minute checks -  Vital signs:  q12 hours -  Precautions: suicide, elopement, and assault   2. Psychiatric Diagnoses and Treatment:     # Schizoaffective disorder, bipolar type, mixed episode # Posttraumatic stress disorder   - Continue Risperdal taper: 2 mg nightly 11/24, 1 mg nightly 11/24, then stop - Change Abilify 10mg  from at bedtime to in the morning.  Patient reports this medication did not cause sedation and she liked the way it made her feel yesterday after she took it.  Patient request taking Abilify during the day.  - The risks/benefits/side-effects/alternatives to this medication were discussed in detail with the patient and time was given for questions. The patient consents to medication trial.   - Metabolic profile and EKG monitoring obtained while on an atypical antipsychotic  BMI: 60 TSH: 1.172 Lipid panel: LDL 149 HbgA1c: 6.5 QTc: 399 Prolactin: Awaiting - Encouraged patient to participate in unit milieu and in scheduled group therapies  - Short Term Goals: Ability to maintain clinical measurements within normal limits will improve - Long Term Goals: Improvement in symptoms so as ready for discharge   Other PRNS: Anxiety, agitation, sleep, minor pain   Other labs reviewed on admission: CMP WNL, MCV of 70.7 (baseline appears to be in the high 60s/low 70s)              3. Medical Issues Being Addressed:    # Hyperprolactinemia  -Transitioning from Risperdal to Abilify   # T2DM - Consider restarting metformin while in hospital   4. Discharge Planning:    - Estimated discharge date: 5-7 more  day   - Social work and case management to assist with discharge planning and identification of hospital follow-up needs prior to discharge. - Discharge concerns: Need to establish a safety plan; medication compliance and effectiveness. - Discharge goals: Return home with outpatient referrals for mental health follow-up including medication management/psychotherapy.   Phineas Inches, MD 05/15/2023, 12:10 PM  Total Time Spent in Direct Patient Care:  I personally spent 35 minutes on the unit in direct patient care. The direct patient care time included face-to-face time with the patient, reviewing the patient's chart, communicating with other professionals, and coordinating care. Greater than 50% of this time was spent in counseling or coordinating care with the patient regarding goals of hospitalization, psycho-education, and discharge planning needs.   Phineas Inches, MD Psychiatrist

## 2023-05-15 NOTE — Group Note (Signed)
Date:  05/15/2023 Time:  11:58 AM  Group Topic/Focus:  Goals Group:   The focus of this group is to help patients establish daily goals to achieve during treatment and discuss how the patient can incorporate goal setting into their daily lives to aide in recovery.    Participation Level:  Active  Participation Quality:  Appropriate  Affect:  Appropriate  Cognitive:  Appropriate  Insight: Appropriate  Engagement in Group:  Engaged  Modes of Intervention:  Discussion    Additional Comments:     Reymundo Poll 05/15/2023, 11:58 AM

## 2023-05-15 NOTE — Group Note (Signed)
Date:  05/15/2023 Time:  4:06 AM  Group Topic/Focus:  Wrap-Up Group:   The focus of this group is to help patients review their daily goal of treatment and discuss progress on daily workbooks.    Participation Level:  Active  Participation Quality:  Appropriate and Sharing  Affect:  Appropriate  Cognitive:  Appropriate  Insight: Appropriate  Engagement in Group:  Engaged  Modes of Intervention:  Activity and Socialization  Additional Comments:  Patient stated that she "feels better". Patient stated that she :got out her room today" and interacted with others and participated throughout the day. Patient rated her day a 8/10. Patient did not participate in the group activity; patient came late to group.   Lindsay [name] 05/15/2023, 4:06 AM

## 2023-05-15 NOTE — Plan of Care (Signed)
  Problem: Education: Goal: Knowledge of [facility] Education information/materials will improve Outcome: Progressing Goal: Emotional status will improve Outcome: Progressing Goal: Mental status will improve Outcome: Progressing Goal: Verbalization of understanding the information provided will improve Outcome: Progressing   

## 2023-05-15 NOTE — Progress Notes (Signed)
   05/14/23 1945  Psych Admission Type (Psych Patients Only)  Admission Status Voluntary  Psychosocial Assessment  Patient Complaints Anxiety;Depression;Insomnia  Eye Contact Fair  Facial Expression Flat  Affect Anxious;Depressed  Speech Unremarkable  Interaction Assertive  Motor Activity Slow  Appearance/Hygiene Unremarkable  Behavior Characteristics Cooperative;Appropriate to situation  Mood Depressed;Anxious  Thought Process  Coherency WDL  Content WDL  Delusions None reported or observed  Perception WDL  Hallucination None reported or observed  Judgment WDL  Confusion None  Danger to Self  Current suicidal ideation? Denies  Agreement Not to Harm Self Yes  Description of Agreement verbal  Danger to Others  Danger to Others None reported or observed

## 2023-05-15 NOTE — [facility] Group Notes (Signed)
Adult Psychoeducational Group Note  Date:  05/15/2023 Time:  10:42 PM  Group Topic/Focus:  Wrap-Up Group:   The focus of this group is to help patients review their daily goal of treatment and discuss progress on daily workbooks.  Participation Level:  Active  Participation Quality:  Appropriate  Affect:  Appropriate  Cognitive:  Alert and Appropriate  Insight: Good  Engagement in Group:  Engaged  Modes of Intervention:  Discussion, Socialization, and Support  Additional Comments:  Pt attended and engaged in wrap up group. Pt goal for today was to focus on medication management/regimen. Pt shared that she was more social than usual today as one positive thing that happened for her today. Tomorrow, pt wants to focus on establishing positive relationships. Pt rated her day a 8/10.   Lindsay [name] Mars 05/15/2023, 10:42 PM

## 2023-05-15 NOTE — Progress Notes (Signed)
Lindsay [name] is ambulatory on unit and in day room. Interacting appropriately with staff and peers.Intermittently manic behavior. Exhibiting some paranoia but cooperative Dr Sherron Flemings gave extensive teaching concerning medication Cooperative-Attending groups and meal times Vital signs stable Voices no complaints currently

## 2023-05-16 ENCOUNTER — Encounter (HOSPITAL_COMMUNITY): Payer: Self-pay

## 2023-05-16 DIAGNOSIS — F209 Schizophrenia, unspecified: Secondary | ICD-10-CM

## 2023-05-16 NOTE — Group Note (Signed)
Occupational Therapy Group Note  Group Topic: Sleep Hygiene  Group Date: 05/16/2023 Start Time: 1430 End Time: 1500 Facilitators: Ted Mcalpine, OT   Group Description: Group encouraged increased participation and engagement through topic focused on sleep hygiene. Patients reflected on the quality of sleep they typically receive and identified areas that need improvement. Group was given background information on sleep and sleep hygiene, including common sleep disorders. Group members also received information on how to improve one's sleep and introduced a sleep diary as a tool that can be utilized to track sleep quality over a length of time. Group session ended with patients identifying one or more strategies they could utilize or implement into their sleep routine in order to improve overall sleep quality.        Therapeutic Goal(s):  Identify one or more strategies to improve overall sleep hygiene  Identify one or more areas of sleep that are negatively impacted (sleep too much, too little, etc)     Participation Level: Engaged   Participation Quality: Independent   Behavior: Appropriate   Speech/Thought Process: Relevant   Affect/Mood: Appropriate   Insight: Fair   Judgement: Fair      Modes of Intervention: Education  Patient Response to Interventions:  Attentive   Plan: Continue to engage patient in OT groups 2 - 3x/week.  05/16/2023  Ted Mcalpine, OT   Kerrin Champagne, OT

## 2023-05-16 NOTE — [facility] Group Notes (Signed)
[facility] Group Notes:  (Nursing/MHT/Case Management/Adjunct)  Date:  05/16/2023  Time:  2000  Type of Therapy:   AA Group  Participation Level:  Active  Participation Quality:  Appropriate and Attentive  Affect:  Appropriate  Cognitive:  Alert and Appropriate  Insight:  Appropriate  Engagement in Group:  Engaged and Supportive  Modes of Intervention:  Education and Support  Summary of Progress/Problems:  Fay Records 05/16/2023, 10:11 PM

## 2023-05-16 NOTE — [facility] Group Notes (Signed)
The focus of this group is to help patients establish daily goals to achieve during treatment and discuss how the patient can incorporate goal setting into their daily lives to aide in recovery.   Pt attended and contributed to group

## 2023-05-16 NOTE — Plan of Care (Signed)
  Problem: Education: Goal: Emotional status will improve Outcome: Progressing Goal: Mental status will improve Outcome: Progressing   

## 2023-05-16 NOTE — BH IP Treatment Plan (Signed)
Interdisciplinary Treatment and Diagnostic Plan Update  05/16/2023 Time of Session: 11:15AM Lindsay [name] MRN: [id number]  Principal Diagnosis: Schizoaffective disorder, bipolar type (HCC)  Secondary Diagnoses: Principal Problem:   Schizoaffective disorder, bipolar type (HCC) Active Problems:   Hyperprolactinemia (HCC)   Current Medications:  Current Facility-Administered Medications  Medication Dose Route Frequency Provider Last Rate Last Admin   acetaminophen (TYLENOL) tablet 650 mg  650 mg Oral Q6H PRN Ajibola, Ene A, NP       alum & mag hydroxide-simeth (MAALOX/MYLANTA) 200-200-20 MG/5ML suspension 30 mL  30 mL Oral Q4H PRN Ajibola, Ene A, NP       ARIPiprazole (ABILIFY) tablet 10 mg  10 mg Oral Daily Massengill, Harrold Donath, MD   10 mg at 05/16/23 0865   haloperidol (HALDOL) tablet 5 mg  5 mg Oral TID PRN Phineas Inches, MD       And   diphenhydrAMINE (BENADRYL) capsule 50 mg  50 mg Oral TID PRN Massengill, Harrold Donath, MD       haloperidol lactate (HALDOL) injection 5 mg  5 mg Intramuscular TID PRN Massengill, Harrold Donath, MD       And   diphenhydrAMINE (BENADRYL) injection 50 mg  50 mg Intramuscular TID PRN Massengill, Harrold Donath, MD       And   LORazepam (ATIVAN) injection 2 mg  2 mg Intramuscular TID PRN Massengill, Harrold Donath, MD       haloperidol lactate (HALDOL) injection 10 mg  10 mg Intramuscular TID PRN Massengill, Harrold Donath, MD       And   diphenhydrAMINE (BENADRYL) injection 50 mg  50 mg Intramuscular TID PRN Massengill, Harrold Donath, MD       And   LORazepam (ATIVAN) injection 2 mg  2 mg Intramuscular TID PRN Massengill, Harrold Donath, MD       hydrOXYzine (ATARAX) tablet 25 mg  25 mg Oral TID PRN Ajibola, Ene A, NP   25 mg at 05/15/23 2055   magnesium hydroxide (MILK OF MAGNESIA) suspension 30 mL  30 mL Oral Daily PRN Ajibola, Ene A, NP       metFORMIN (GLUCOPHAGE) tablet 500 mg  500 mg Oral Q breakfast Tomie China, MD   500 mg at 05/16/23 7846   risperiDONE (RISPERDAL) tablet 1 mg  1 mg  Oral Once Tomie China, MD       traZODone (DESYREL) tablet 50 mg  50 mg Oral QHS PRN Ajibola, Ene A, NP       PTA Medications: Medications Prior to Admission  Medication Sig Dispense Refill Last Dose   buPROPion (WELLBUTRIN XL) 150 MG 24 hr tablet Take 150 mg by mouth every morning.   05/13/2023   cetirizine (ZYRTEC) 10 MG tablet Take 10 mg by mouth daily as needed for allergies.   Past Month   hydrOXYzine (ATARAX) 50 MG tablet Take 50 mg by mouth 3 (three) times daily as needed for anxiety.   05/13/2023   ibuprofen (ADVIL) 800 MG tablet Take 800 mg by mouth 3 (three) times daily as needed for mild pain (pain score 1-3).   Past Month   metFORMIN (GLUCOPHAGE) 500 MG tablet Take 500 mg by mouth daily.   05/13/2023   risperiDONE (RISPERDAL) 2 MG tablet Take 2 mg by mouth daily as needed (For anxiety).   Past Week   risperidone (RISPERDAL) 4 MG tablet Take 4 mg by mouth at bedtime.   05/13/2023   VENTOLIN HFA 108 (90 Base) MCG/ACT inhaler Inhale 1 puff into the lungs every 4 (four) hours as  needed for wheezing or shortness of breath.   Past Month   Vitamin D, Ergocalciferol, (DRISDOL) 1.25 MG (50000 UNIT) CAPS capsule Take 50,000 Units by mouth once a week.   Past Week   ACCU-CHEK GUIDE test strip USE TO CHECK SUGAR TWICE DAILY      Accu-Chek Softclix Lancets lancets 2 (two) times daily.       Patient Stressors: Marital or family conflict   Occupational concerns    Patient Strengths: Capable of independent living  General fund of knowledge  Supportive family/friends   Treatment Modalities: Medication Management, Group therapy, Case management,  1 to 1 session with clinician, Psychoeducation, Recreational therapy.   Physician Treatment Plan for Primary Diagnosis: Schizoaffective disorder, bipolar type (HCC) Long Term Goal(s):     Short Term Goals: Ability to maintain clinical measurements within normal limits will improve  Medication Management: Evaluate patient's response, side  effects, and tolerance of medication regimen.  Therapeutic Interventions: 1 to 1 sessions, Unit Group sessions and Medication administration.  Evaluation of Outcomes: Not Progressing  Physician Treatment Plan for Secondary Diagnosis: Principal Problem:   Schizoaffective disorder, bipolar type (HCC) Active Problems:   Hyperprolactinemia (HCC)  Long Term Goal(s):     Short Term Goals: Ability to maintain clinical measurements within normal limits will improve     Medication Management: Evaluate patient's response, side effects, and tolerance of medication regimen.  Therapeutic Interventions: 1 to 1 sessions, Unit Group sessions and Medication administration.  Evaluation of Outcomes: Not Progressing   RN Treatment Plan for Primary Diagnosis: Schizoaffective disorder, bipolar type (HCC) Long Term Goal(s): Knowledge of disease and therapeutic regimen to maintain health will improve  Short Term Goals: Ability to remain free from injury will improve, Ability to verbalize frustration and anger appropriately will improve, Ability to demonstrate self-control, Ability to participate in decision making will improve, Ability to verbalize feelings will improve, Ability to disclose and discuss suicidal ideas, Ability to identify and develop effective coping behaviors will improve, and Compliance with prescribed medications will improve  Medication Management: RN will administer medications as ordered by provider, will assess and evaluate patient's response and provide education to patient for prescribed medication. RN will report any adverse and/or side effects to prescribing provider.  Therapeutic Interventions: 1 on 1 counseling sessions, Psychoeducation, Medication administration, Evaluate responses to treatment, Monitor vital signs and CBGs as ordered, Perform/monitor CIWA, COWS, AIMS and Fall Risk screenings as ordered, Perform wound care treatments as ordered.  Evaluation of Outcomes: Not  Progressing   LCSW Treatment Plan for Primary Diagnosis: Schizoaffective disorder, bipolar type (HCC) Long Term Goal(s): Safe transition to appropriate next level of care at discharge, Engage patient in therapeutic group addressing interpersonal concerns.  Short Term Goals: Engage patient in aftercare planning with referrals and resources, Increase social support, Increase ability to appropriately verbalize feelings, Increase emotional regulation, Facilitate acceptance of mental health diagnosis and concerns, Facilitate patient progression through stages of change regarding substance use diagnoses and concerns, Identify triggers associated with mental health/substance abuse issues, and Increase skills for wellness and recovery  Therapeutic Interventions: Assess for all discharge needs, 1 to 1 time with Social worker, Explore available resources and support systems, Assess for adequacy in community support network, Educate family and significant other(s) on suicide prevention, Complete Psychosocial Assessment, Interpersonal group therapy.  Evaluation of Outcomes: Not Progressing   Progress in Treatment: Attending groups: Yes. Participating in groups: Yes. Taking medication as prescribed: Yes. Toleration medication: Yes. Family/Significant other contact made: No, will contact:  mother  Hilbert Corrigan [phone] Patient understands diagnosis: Yes. Discussing patient identified problems/goals with staff: Yes. Medical problems stabilized or resolved: Yes. Denies suicidal/homicidal ideation: Yes. Issues/concerns per patient self-inventory: No.  New problem(s) identified: No, Describe:  none reported  New Short Term/Long Term Goal(s): medication stabilization, elimination of SI thoughts, development of comprehensive mental wellness plan.    Patient Goals:  "Manage my medications and limit my stressors with coping skills"  Discharge Plan or Barriers: Patient recently admitted. CSW will  continue to follow and assess for appropriate referrals and possible discharge planning.    Reason for Continuation of Hospitalization: Medication stabilization Other; describe "Paranoid and irrational thoughts" per chart review  Estimated Length of Stay: 5-7 days  Last 3 [place] Suicide Severity Risk Score: Flowsheet Row [facility] (Current[facility] Most recent reading at 05/14/2023  9:00 AM ED from 05/13/2023 in [facility] Most recent reading at 05/13/2023  4:41 PM ED from 12/28/2022 in [facility] [place] Most recent reading at 12/28/2022  6:03 PM  C-SSRS RISK CATEGORY No Risk No Risk No Risk       Last PHQ 2/9 Scores:     No data to display          Scribe for Treatment Team: Kathi Der, LCSWA 05/16/2023 1:46 PM

## 2023-05-16 NOTE — Progress Notes (Signed)
[facility] MD Progress Note  05/16/2023 10:51 AM Lindsay [name]  MRN:  [id number]  Subjective:    Lindsay [name] is a 32 y.o. female with a past psychiatric history of schizophrenia, bipolar disorder, anxiety, and depression. Patient initially arrived to [facility] on 11/22 for worsening stress and paranoia, and admitted to [facility] voluntarily on 11/22 for impaired functioning and stabilization of acute on chronic psychiatric conditions. 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   PRNs required overnight: Hydroxyzine 25 mg x1  Discussed during  bed progression: Slept 8 hours. Continues to think about son's behavior. Anxiety 2/10, depression 5/10. Clearer today.   On interview after being woken up, patient is flatter than previously, says that she's "feeling foggy" as of this morning and that "everything is still", which is how she typically feels on Abilify. That said, she is has been "feeling clearer" since tapering risperdal and starting abilify, notes reduced paranoia since admission from 4-5 --> 2-3 out of 10. Despite some frustration with medication changes, but is amenable to current plan. Denies suicidal ideation and homicidal ideation. No auditory or visual hallucinations. Sleeping well. Eating less than previously.  On interacting with patient later after given morning abilify, patient more effusive about efficacy of medication, appears hypomanic.  Principal Problem: Schizoaffective disorder, bipolar type (HCC) Diagnosis: Principal Problem:   Schizoaffective disorder, bipolar type (HCC) Active Problems:   Hyperprolactinemia (HCC)  Total Time spent with patient: 20 minutes  Past Psychiatric History:  Current psychiatrist: Kenney Houseman May. Current therapist: [facility]. Previous psychiatric diagnoses: schizophrenia (2019), bipolar disorder, depression, anxiety. Current psychiatric medications: wellbutrin 150mg  XL, risperdal 4mg  at bedtime,  riperdal 2mg  at bedtime PRN. Psychiatric medication history/compliance: Patient is compliant with her medications, has trialed zoloft. Psychiatric hospitalization(s): 2019 (first schizophrenia diagnosis), 2020, 2021. Psychotherapy history: none. Neuromodulation history: none. History of suicide (obtained from HPI): denied. History of homicide or aggression (obtained in HPI): denied.   Past Medical History:  Past Medical History:  Diagnosis Date   Anemia    Asthma    Homozygous alpha thalassemia (HCC)    Hx of chlamydia infection    Hx of scoliosis    Hyperlipidemia    Pregnancy induced hypertension    Schizophrenia (HCC)     Past Surgical History:  Procedure Laterality Date   CESAREAN SECTION N/A 01/25/2019   Procedure: CESAREAN SECTION;  Surgeon: Jaymes Graff, MD;  Location: [facility] LD ORS;  Service: Obstetrics;  Laterality: N/A;   TONSILLECTOMY     Family History:  Family History  Problem Relation Age of Onset   Thyroid disease Mother    Hypertension Mother    Cancer Father        liver   Diabetes Maternal Grandmother    Cancer Maternal Grandmother    Family Psychiatric  History:  Psychiatric diagnoses: Schizophrenia in an uncle, unspecified major mental illness in another uncle/aunt. Suicide history: Yes, an uncle.  Violence/aggression: Endorsed some violence and aggression during family fights.   Social History:  Social History   Substance and Sexual Activity  Alcohol Use No     Social History   Substance and Sexual Activity  Drug Use No    Social History   Socioeconomic History   Marital status: Single    Spouse name: Not on file   Number of children: Not on file   Years of education: Not on file   Highest education level: Not on file  Occupational History  Not on file  Tobacco Use   Smoking status: Never   Smokeless tobacco: Never  Vaping Use   Vaping status: Never Used  Substance and Sexual Activity   Alcohol use: No   Drug use: No   Sexual  activity: Yes    Birth control/protection: None  Other Topics Concern   Not on file  Social History Narrative   Not on file   Social Determinants of Health   Financial Resource Strain: Low Risk  (01/18/2019)   Overall Financial Resource Strain (CARDIA)    Difficulty of Paying Living Expenses: Not hard at all  Food Insecurity: No Food Insecurity (05/14/2023)   Hunger Vital Sign    Worried About Running Out of Food in the Last Year: Never true    Ran Out of Food in the Last Year: Never true  Transportation Needs: No Transportation Needs (05/14/2023)   PRAPARE - Administrator, Civil Service (Medical): No    Lack of Transportation (Non-Medical): No  Physical Activity: Not on file  Stress: No Stress Concern Present (01/18/2019)   [employer] of Occupational Health - Occupational Stress Questionnaire    Feeling of Stress : Not at all  Social Connections: Not on file    Current Medications: Current Facility-Administered Medications  Medication Dose Route Frequency Provider Last Rate Last Admin   acetaminophen (TYLENOL) tablet 650 mg  650 mg Oral Q6H PRN Ajibola, Ene A, NP       alum & mag hydroxide-simeth (MAALOX/MYLANTA) 200-200-20 MG/5ML suspension 30 mL  30 mL Oral Q4H PRN Ajibola, Ene A, NP       ARIPiprazole (ABILIFY) tablet 10 mg  10 mg Oral Daily Massengill, Harrold Donath, MD   10 mg at 05/16/23 1610   haloperidol (HALDOL) tablet 5 mg  5 mg Oral TID PRN Phineas Inches, MD       And   diphenhydrAMINE (BENADRYL) capsule 50 mg  50 mg Oral TID PRN Massengill, Harrold Donath, MD       haloperidol lactate (HALDOL) injection 5 mg  5 mg Intramuscular TID PRN Massengill, Harrold Donath, MD       And   diphenhydrAMINE (BENADRYL) injection 50 mg  50 mg Intramuscular TID PRN Massengill, Harrold Donath, MD       And   LORazepam (ATIVAN) injection 2 mg  2 mg Intramuscular TID PRN Massengill, Harrold Donath, MD       haloperidol lactate (HALDOL) injection 10 mg  10 mg Intramuscular TID PRN Massengill, Harrold Donath,  MD       And   diphenhydrAMINE (BENADRYL) injection 50 mg  50 mg Intramuscular TID PRN Massengill, Harrold Donath, MD       And   LORazepam (ATIVAN) injection 2 mg  2 mg Intramuscular TID PRN Massengill, Harrold Donath, MD       hydrOXYzine (ATARAX) tablet 25 mg  25 mg Oral TID PRN Ajibola, Ene A, NP   25 mg at 05/15/23 2055   magnesium hydroxide (MILK OF MAGNESIA) suspension 30 mL  30 mL Oral Daily PRN Ajibola, Ene A, NP       metFORMIN (GLUCOPHAGE) tablet 500 mg  500 mg Oral Q breakfast Tomie China, MD   500 mg at 05/16/23 9604   risperiDONE (RISPERDAL) tablet 1 mg  1 mg Oral Once Tomie China, MD       traZODone (DESYREL) tablet 50 mg  50 mg Oral QHS PRN Ajibola, Ene A, NP        Lab Results:  No results found for this or  any previous visit (from the past 48 hour(s)).   Blood Alcohol level:  Lab Results  Component Value Date   ETH <10 04/08/2019   ETH <10 10/18/2017    Metabolic Disorder Labs: Lab Results  Component Value Date   HGBA1C 6.5 (H) 05/13/2023   MPG 139.85 05/13/2023   MPG 122.63 09/13/2020   Lab Results  Component Value Date   PROLACTIN 75.7 (H) 05/13/2023   PROLACTIN 13.0 09/13/2020   Lab Results  Component Value Date   CHOL 231 (H) 05/13/2023   TRIG 170 (H) 05/13/2023   HDL 48 05/13/2023   CHOLHDL 4.8 05/13/2023   VLDL 34 05/13/2023   LDLCALC 149 (H) 05/13/2023   LDLCALC 114 (H) 09/13/2020    Physical Findings:  No EPS noted.   Musculoskeletal: Strength & Muscle Tone: within normal limits Gait & Station: normal Patient leans: N/A  Psychiatric Specialty Exam:  Presentation  General Appearance:  Appropriate for Environment; Casual  Eye Contact: Fair  Speech: Normal Rate  Speech Volume: Normal  Handedness: Right   Mood and Affect  Mood: Dysphoric; Anxious  Affect: Constricted   Thought Process  Thought Processes: Linear  Descriptions of Associations:Intact  Orientation:Full (Time, Place and Person)  Thought  Content:Logical  History of Schizophrenia/Schizoaffective disorder:No  Duration of Psychotic Symptoms:Less than six months  Hallucinations:Hallucinations: None  Ideas of Reference:Paranoia  Suicidal Thoughts:Suicidal Thoughts: No  Homicidal Thoughts:Homicidal Thoughts: No   Sensorium  Memory: Immediate Fair; Recent Fair; Remote Fair  Judgment: Impaired  Insight: Lacking   Executive Functions  Concentration: Poor  Attention Span: Poor  Recall: Poor  Fund of Knowledge: Fair  Language: Fair   Music therapist; Desire for Improvement; Financial Resources/Insurance; Housing; Leisure Time; Physical Health; Social Support   Sleep  Sleep: Good Number of Hours of Sleep: 8   Physical Exam Vitals reviewed.  Constitutional:      Appearance: She is normal weight.  Pulmonary:     Effort: Pulmonary effort is normal.  Neurological:     Mental Status: She is alert.     Motor: No weakness.     Gait: Gait normal.  Psychiatric:        Behavior: Behavior normal.   Review of Systems  Constitutional:  Negative for chills and fever.  Cardiovascular:  Negative for chest pain and palpitations.  Neurological:  Negative for dizziness, tingling, tremors and headaches.  Psychiatric/Behavioral:  Positive for depression. Negative for hallucinations, memory loss, substance abuse and suicidal ideas. The patient is nervous/anxious. The patient does not have insomnia.   All other systems reviewed and are negative.  Blood pressure 118/81, pulse (!) 110, temperature 98.6 F (37 C), temperature source Oral, resp. rate 16, height 5\' 3"  (1.6 m), weight (!) 155.1 kg, SpO2 98%, unknown if currently breastfeeding. Body mass index is 60.58 kg/m.   Treatment Plan Summary: Daily contact with patient to assess and evaluate symptoms and progress in treatment and Medication management   ASSESSMENT:    Diagnoses / Active  Problems: Schizoaffective disorder, bipolar type, mixed episode Posttraumatic stress disorder Hyperprolactinemia   PLAN:   Safety and Monitoring: -  VOLUNTARY  admission to inpatient psychiatric unit for safety, stabilization and treatment. - Daily contact with patient to assess and evaluate symptoms and progress in treatment - Patient's case to be discussed in multi-disciplinary team meeting -  Observation Level : q15 minute checks -  Vital signs:  q12 hours -  Precautions: suicide, elopement, and assault  2. Psychiatric Diagnoses and Treatment:     # Schizoaffective disorder, bipolar type, mixed episode # Posttraumatic stress disorder  - Continue Risperdal taper: 1 mg nightly 11/25, then stop. - Continue Abilify 10 mg qAM, patient describes thinking clearer, paranoia has improved since admission (4-5/10 --> 2-3/10). Will likely titrate tomorrow 11/26 from Abilify 10 mg qAM to Abilify 15 mg qAM. Will continue to monitor patient's side effect profile, denies akathesia, no EPS noted.   - The risks/benefits/side-effects/alternatives to this medication were discussed in detail with the patient and time was given for questions. The patient consents to medication trial.   - Metabolic profile and EKG monitoring obtained while on an atypical antipsychotic  BMI: 60 TSH: 1.172 Lipid panel: LDL 149 HbgA1c: 6.5 QTc: 399 Prolactin: Awaiting - Encouraged patient to participate in unit milieu and in scheduled group therapies  - Short Term Goals: Ability to maintain clinical measurements within normal limits will improve - Long Term Goals: Improvement in symptoms so as ready for discharge   Other PRNS: Anxiety, agitation, sleep, minor pain   Other labs reviewed on admission: CMP WNL, MCV of 70.7 (baseline appears to be in the high 60s/low 70s)              3. Medical Issues Being Addressed:    # Hyperprolactinemia  -Transitioning from Risperdal to Abilify   # T2DM - Continue Metformin  500 mg    4. Discharge Planning:    - Estimated discharge date: 4-7 more days  - Social work and case management to assist with discharge planning and identification of hospital follow-up needs prior to discharge. - Discharge concerns: Need to establish a safety plan; medication compliance and effectiveness. - Discharge goals: Return home with outpatient referrals for mental health follow-up including medication management/psychotherapy.  Tomie China, MD 05/16/2023, 10:51 AM

## 2023-05-16 NOTE — Progress Notes (Signed)
   05/15/23 2300  Psych Admission Type (Psych Patients Only)  Admission Status Voluntary  Psychosocial Assessment  Patient Complaints Anxiety  Eye Contact Fair  Facial Expression Flat  Affect Anxious  Speech Logical/coherent  Interaction Assertive  Motor Activity Other (Comment) (wnl)  Appearance/Hygiene Unremarkable  Thought Process  Coherency WDL  Content WDL  Delusions None reported or observed  Perception WDL  Hallucination None reported or observed  Judgment WDL  Confusion None  Danger to Self  Current suicidal ideation? Denies

## 2023-05-16 NOTE — Progress Notes (Signed)
Chaplain met with Ilany to provide support. She shared about the loss of her father when she was 89, the changing relationship with her sister and her concern about her son who is 32 years old and is "acting out."  Chaplain provided support and listening.  The conversation was cut short because she received a phone call.  If she has additional needs, please place another consult.  [address] Bcc Pager, [phone]

## 2023-05-16 NOTE — [facility] Suicide Risk Assessment (Signed)
[facility] INPATIENT:  Family/Significant Other Suicide Prevention Education  Suicide Prevention Education:  Education Completed; (Mother) Hilbert Corrigan [phone],  (name of family member/significant other) has been identified by the patient as the family member/significant other with whom the patient will be residing, and identified as the person(s) who will aid the patient in the event of a mental health crisis (suicidal ideations/suicide attempt).  With written consent from the patient, the family member/significant other has been provided the following suicide prevention education, prior to the and/or following the discharge of the patient.  Pt's mother states she talks with pt 2-3x/day, reports that patient lives at home with her 32 year old son. There are no guns or weapons in the home. Mom states no safety concerns for patient discharging at the end of the week, would like to be informed if date changes from 11/29.  The suicide prevention education provided includes the following: Suicide risk factors Suicide prevention and interventions National Suicide Hotline telephone number [facility] assessment telephone number [facility] Emergency Assistance 911 [facility] and/or Residential Mobile Crisis Unit telephone number  Request made of family/significant other to: Remove weapons (e.g., guns, rifles, knives), all items previously/currently identified as safety concern.   Remove drugs/medications (over-the-counter, prescriptions, illicit drugs), all items previously/currently identified as a safety concern.  The family member/significant other verbalizes understanding of the suicide prevention education information provided.  The family member/significant other agrees to remove the items of safety concern listed above.  Kathi Der 05/16/2023, 3:42 PM

## 2023-05-16 NOTE — Group Note (Signed)
Recreation Therapy Group Note   Group Topic:Health and Wellness  Group Date: 05/16/2023 Start Time: 0930 End Time: 1005 Facilitators: Tyke Outman-McCall, LRT,CTRS Location: [address] Dayroom   Group Topic: Exercise/Wellness  Goal Area(s) Addresses:  Patient will verbalize benefit of exercise during group session. Patient will identify an exercise that can be completed post d/c. Patient will acknowledge benefits of exercise when used as a coping mechanism.   Group Description: Patients and LRT discussed the importance of physical exercise and its benefits. During group, patients took turns leading the group in the exercises/stretches of their choosing. Patients completed three rounds of exercise. Patients could get water or take a break if needed.  Education: Physical Activity, Health and Wellness  Education Outcome: Acknowledges understanding/In group clarification offered/Needs additional education.    Affect/Mood: Appropriate   Participation Level: Moderate   Participation Quality: Independent   Behavior: Appropriate   Speech/Thought Process: Focused   Insight: Good   Judgement: Good   Modes of Intervention: Music   Patient Response to Interventions:  Receptive   Education Outcome:  In group clarification offered    Clinical Observations/Individualized Feedback: Pt was bright and was split in exercises completed and observed. Pt was social and interactive with peers. Pt was appropriate during group session.      Plan: Continue to engage patient in RT group sessions 2-3x/week.   Avory Mimbs-McCall, LRT,CTRS  05/16/2023 12:10 PM

## 2023-05-16 NOTE — [facility] Group Notes (Signed)

## 2023-05-16 NOTE — Plan of Care (Signed)
Problem: Activity: Goal: Interest or engagement in activities will improve Outcome: Progressing   Problem: Coping: Goal: Ability to demonstrate self-control will improve Outcome: Progressing   Problem: Physical Regulation: Goal: Ability to maintain clinical measurements within normal limits will improve Outcome: Progressing   Problem: Safety: Goal: Periods of time without injury will increase Outcome: Progressing  Pt presents with fixed smile, logical speech, fair eye contact and is ambulatory in milieu with steady gait. Denies SI, HI, AVH and pain when assessed. Reports "I'm not feeling like myself,  my mind was clear, I was in control when I was on the Risperdal but now I'm not". Rates her anxiety 2/10 and depression 5/10 with stressors being "Just my son's behavior at school, he's being out of control lately. Sometimes I feel like I pisses my family off but other than that I'm alright". Visible in scheduled groups, participated and interacts well with others. Support, encouragement and reassurance offered to pt. Safety maintained at Q 15 minutes intervals without incident.

## 2023-05-17 DIAGNOSIS — F250 Schizoaffective disorder, bipolar type: Secondary | ICD-10-CM | POA: Diagnosis not present

## 2023-05-17 MED ORDER — ARIPIPRAZOLE 10 MG PO TABS
10.0000 mg | ORAL_TABLET | Freq: Every day | ORAL | Status: DC
  Administered 2023-05-18: 10 mg via ORAL
  Filled 2023-05-17 (×3): qty 1

## 2023-05-17 MED ORDER — CLONIDINE HCL 0.1 MG PO TABS
0.1000 mg | ORAL_TABLET | Freq: Once | ORAL | Status: AC
  Administered 2023-05-18: 0.1 mg via ORAL
  Filled 2023-05-17 (×2): qty 1

## 2023-05-17 NOTE — Plan of Care (Signed)
Problem: Activity: Goal: Interest or engagement in activities will improve Outcome: Progressing   Problem: Coping: Goal: Ability to verbalize frustrations and anger appropriately will improve Outcome: Progressing   Problem: Health Behavior/Discharge Planning: Goal: Identification of resources available to assist in meeting health care needs will improve Outcome: Progressing

## 2023-05-17 NOTE — Plan of Care (Addendum)
On collateral call with mother, Shelina Jungling [phone]):   Per mother, patient sounds a whole lot better, especially today. Much more like herself. Progressively better. Patient plans to discharge to mother on Friday, will stay with her through the weekend. Mother would like for patient to have a counsellor to meet with and talk to her regularly, once a week  -- someone who can notice small changes in her before the symptoms of her illness become more pronounced. Will set an alarm on patient's phone to remind her to take her medication. Confirms no firearms in patient's house.

## 2023-05-17 NOTE — Progress Notes (Deleted)
1 evaluation this morning patient reports her admission was triggered by "I was thinking irrationally my thought process was slow I did feel like someone is trying to hurt me he can get my child" she reports since admission she feels "better speaking clearly and distress free" she denies side effect to current medication but she prefers to take her Abilify at night as in the long-term this will help her compliance.  She does not present sleepy and does not display any signs consistent with EPS or TD.  She reports better sleep at night as well as decreased a better appetite since starting the risperidone.  She is attending groups and participating, she reports she lives with her son but her mother lives nearby and is very supportive, she is talking with her mother on the phone daily since admission.  Will have staff communicate with patient's mother to assess improvement and approaching baseline and discussed discharge planning.  In regard to medication regimen given patient report above I will change Abilify to nighttime dosing to improve compliance in the long-term.  Discussed with patient today is a Thursday she is feeling and reporting doing better we will continue to monitor and assess in the next few days to discuss further treatment plan and discharge planning.  Patient does not present psychotic or paranoid or disorganized.

## 2023-05-17 NOTE — Progress Notes (Signed)
This author checked the patient's blood pressure more than an hour ago and it was elevated. The blood pressure was checked using her upper arm, lower arm, as well as her other arm. The nurse inquired about the patient's blood pressure and was informed that it was elevated. The nurse attempted to check the patient's manual blood pressure but she refused. She insisted on speaking with this Lindsay [name] and stated that she did not "feel safe". In addition,she requested to speak with someone "higher up". Once again, the patient refused to speak with her nurse and sated that she didn't feel safe. The patient feels that her blood pressure is just fine when in fact, it is elevated. She informed that the staff that she does not want to be disturbed again until she wakes up in the morning.

## 2023-05-17 NOTE — [facility] Group Notes (Signed)
[facility] Group Notes:  (Nursing/MHT/Case Management/Adjunct)  Date:  05/17/2023  Time:  10:22 PM  Type of Therapy:  Psychoeducational Skills  Participation Level:  Active  Participation Quality:  Appropriate  Affect:  Appropriate  Cognitive:  Appropriate  Insight:  Good  Engagement in Group:  Engaged  Modes of Intervention:  Education  Summary of Progress/Problems: Patient rated her day as a 9 out of 10 since she felt "less foggy" today. She states that she was able to get more rest today and socialized more today. . Her goal for tomorrow is to work on her discharge plans. Patient states that she has not seen the Child psychotherapist.   Hazle Coca S 05/17/2023, 10:22 PM

## 2023-05-17 NOTE — Group Note (Signed)
Date:  05/17/2023 Time:  9:51 AM  Group Topic/Focus:  Goals Group:   The focus of this group is to help patients establish daily goals to achieve during treatment and discuss how the patient can incorporate goal setting into their daily lives to aide in recovery.    Participation Level:  Active  Participation Quality:  Appropriate  Affect:  Appropriate  Cognitive:  Alert  Insight: Appropriate  Engagement in Group:  Engaged  Modes of Intervention:  Discussion  Additional Comments:    Beckie Busing 05/17/2023, 9:51 AM

## 2023-05-17 NOTE — Progress Notes (Signed)
   05/17/23 0750  Psych Admission Type (Psych Patients Only)  Admission Status Voluntary  Psychosocial Assessment  Patient Complaints Anxiety  Eye Contact Fair  Facial Expression Anxious  Affect Anxious  Speech Logical/coherent  Interaction Assertive  Motor Activity Slow  Appearance/Hygiene Unremarkable  Behavior Characteristics Cooperative;Appropriate to situation  Mood Anxious  Aggressive Behavior  Effect No apparent injury  Thought Process  Coherency Circumstantial  Content WDL  Delusions WDL  Perception WDL  Hallucination None reported or observed  Judgment WDL  Confusion None  Danger to Self  Current suicidal ideation? Denies  Agreement Not to Harm Self Yes  Description of Agreement verbal  Danger to Others  Danger to Others None reported or observed

## 2023-05-17 NOTE — Progress Notes (Signed)
   05/16/23 2156  Psych Admission Type (Psych Patients Only)  Admission Status Voluntary  Psychosocial Assessment  Patient Complaints Anxiety  Eye Contact Fair  Facial Expression Fixed smile  Affect Anxious  Speech Logical/coherent  Interaction Assertive  Motor Activity Slow  Appearance/Hygiene Unremarkable  Behavior Characteristics Cooperative  Mood Anxious  Aggressive Behavior  Effect No apparent injury  Thought Process  Coherency Circumstantial  Content WDL  Delusions WDL  Perception WDL  Hallucination None reported or observed  Judgment WDL  Confusion None  Danger to Self  Current suicidal ideation? Denies  Agreement Not to Harm Self Yes  Description of Agreement verbal  Danger to Others  Danger to Others None reported or observed

## 2023-05-17 NOTE — Group Note (Signed)
Date:  05/17/2023 Time:  9:53 AM  Group Topic/Focus:  Developing a Wellness Toolbox:   The focus of this group is to help patients develop a "wellness toolbox" with skills and strategies to promote recovery upon discharge.    Participation Level:  Active  Participation Quality:  Appropriate  Affect:  Appropriate  Cognitive:  Alert  Insight: Good  Engagement in Group:  Engaged  Modes of Intervention:  Education  Additional Comments:    Beckie Busing 05/17/2023, 9:53 AM

## 2023-05-17 NOTE — Group Note (Signed)
[facility] LCSW Group Therapy Note   Group Date: 05/17/2023 Start Time: 1100 End Time: 1200  Type of Therapy:  Group Therapy  Participation Level:  Patient did not attend group on today. Patients have been encouraged to participate in all programming on milieu.   Loleta Dicker, LCSW

## 2023-05-17 NOTE — Progress Notes (Signed)
[facility] MD Progress Note  05/17/2023 12:36 PM Lindsay [name]  MRN:  [id number]  Subjective:    Lindsay [name] is a 32 y.o. female with a past psychiatric history of schizophrenia, bipolar disorder, anxiety, and depression. Patient initially arrived to [facility] on 11/22 for worsening stress and paranoia, and admitted to [facility] voluntarily on 11/22 for impaired functioning and stabilization of acute on chronic psychiatric conditions. 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   PRNs required overnight: Hydroxyzine 25 mg x1  Discussed during bed progression: Slept 8 hours.  Feels "better."  Denied anxiety, depression.  On interview, patient is "feeling fine", mood is "normal", and she is "not tired." Has not been thinking stressful thoughts.  Denies paranoid thinking altogether.  She thinks that her improvement may be a result of her inpatient status, and being removed from the stressors of day-to-day life.  Discussed increasing Abilify dosing, patient ambivalent, requested that it if medication is increased, that it be increased by 10 mg in the morning and 5 mg at night instead of 15 mg in the morning.  Denied subjective symptoms of EPS.  Denied suicidal ideation, homicidal ideation, audible/visual hallucinations.  Eating well. Sleeping well.  Principal Problem: Schizoaffective disorder, bipolar type (HCC) Diagnosis: Principal Problem:   Schizoaffective disorder, bipolar type (HCC) Active Problems:   Hyperprolactinemia (HCC)  Total Time spent with patient: 20 minutes  Past Psychiatric History:  Current psychiatrist: Kenney Houseman May. Current therapist: [facility]. Previous psychiatric diagnoses: schizophrenia (2019), bipolar disorder, depression, anxiety. Current psychiatric medications: wellbutrin 150mg  XL, risperdal 4mg  at bedtime, riperdal 2mg  at bedtime PRN. Psychiatric medication history/compliance: Patient is compliant with her medications, has  trialed zoloft. Psychiatric hospitalization(s): 2019 (first schizophrenia diagnosis), 2020, 2021. Psychotherapy history: none. Neuromodulation history: none. History of suicide (obtained from HPI): denied. History of homicide or aggression (obtained in HPI): denied.   Past Medical History:  Past Medical History:  Diagnosis Date   Anemia    Asthma    Homozygous alpha thalassemia (HCC)    Hx of chlamydia infection    Hx of scoliosis    Hyperlipidemia    Pregnancy induced hypertension    Schizophrenia (HCC)     Past Surgical History:  Procedure Laterality Date   CESAREAN SECTION N/A 01/25/2019   Procedure: CESAREAN SECTION;  Surgeon: Jaymes Graff, MD;  Location: [facility] LD ORS;  Service: Obstetrics;  Laterality: N/A;   TONSILLECTOMY     Family History:  Family History  Problem Relation Age of Onset   Thyroid disease Mother    Hypertension Mother    Cancer Father        liver   Diabetes Maternal Grandmother    Cancer Maternal Grandmother    Family Psychiatric  History:  Psychiatric diagnoses: Schizophrenia in an uncle, unspecified major mental illness in another uncle/aunt. Suicide history: Yes, an uncle.  Violence/aggression: Endorsed some violence and aggression during family fights.   Social History:  Social History   Substance and Sexual Activity  Alcohol Use No     Social History   Substance and Sexual Activity  Drug Use No    Social History   Socioeconomic History   Marital status: Single    Spouse name: Not on file   Number of children: Not on file   Years of education: Not on file   Highest education level: Not on file  Occupational History   Not on file  Tobacco Use   Smoking status: Never  Smokeless tobacco: Never  Vaping Use   Vaping status: Never Used  Substance and Sexual Activity   Alcohol use: No   Drug use: No   Sexual activity: Yes    Birth control/protection: None  Other Topics Concern   Not on file  Social History Narrative   Not  on file   Social Determinants of Health   Financial Resource Strain: Low Risk  (01/18/2019)   Overall Financial Resource Strain (CARDIA)    Difficulty of Paying Living Expenses: Not hard at all  Food Insecurity: No Food Insecurity (05/14/2023)   Hunger Vital Sign    Worried About Running Out of Food in the Last Year: Never true    Ran Out of Food in the Last Year: Never true  Transportation Needs: No Transportation Needs (05/14/2023)   PRAPARE - Administrator, Civil Service (Medical): No    Lack of Transportation (Non-Medical): No  Physical Activity: Not on file  Stress: No Stress Concern Present (01/18/2019)   [employer] of Occupational Health - Occupational Stress Questionnaire    Feeling of Stress : Not at all  Social Connections: Not on file    Current Medications: Current Facility-Administered Medications  Medication Dose Route Frequency Provider Last Rate Last Admin   acetaminophen (TYLENOL) tablet 650 mg  650 mg Oral Q6H PRN Ajibola, Ene A, NP       alum & mag hydroxide-simeth (MAALOX/MYLANTA) 200-200-20 MG/5ML suspension 30 mL  30 mL Oral Q4H PRN Ajibola, Ene A, NP       [START ON 05/18/2023] ARIPiprazole (ABILIFY) tablet 10 mg  10 mg Oral QHS Attiah, Nadir, MD       haloperidol (HALDOL) tablet 5 mg  5 mg Oral TID PRN Massengill, Harrold Donath, MD       And   diphenhydrAMINE (BENADRYL) capsule 50 mg  50 mg Oral TID PRN Massengill, Harrold Donath, MD       haloperidol lactate (HALDOL) injection 5 mg  5 mg Intramuscular TID PRN Massengill, Harrold Donath, MD       And   diphenhydrAMINE (BENADRYL) injection 50 mg  50 mg Intramuscular TID PRN Massengill, Harrold Donath, MD       And   LORazepam (ATIVAN) injection 2 mg  2 mg Intramuscular TID PRN Massengill, Harrold Donath, MD       haloperidol lactate (HALDOL) injection 10 mg  10 mg Intramuscular TID PRN Massengill, Harrold Donath, MD       And   diphenhydrAMINE (BENADRYL) injection 50 mg  50 mg Intramuscular TID PRN Massengill, Harrold Donath, MD       And    LORazepam (ATIVAN) injection 2 mg  2 mg Intramuscular TID PRN Massengill, Harrold Donath, MD       hydrOXYzine (ATARAX) tablet 25 mg  25 mg Oral TID PRN Ajibola, Ene A, NP   25 mg at 05/16/23 1803   magnesium hydroxide (MILK OF MAGNESIA) suspension 30 mL  30 mL Oral Daily PRN Ajibola, Ene A, NP       metFORMIN (GLUCOPHAGE) tablet 500 mg  500 mg Oral Q breakfast Tomie China, MD   500 mg at 05/17/23 0747   traZODone (DESYREL) tablet 50 mg  50 mg Oral QHS PRN Ajibola, Ene A, NP        Lab Results:  No results found for this or any previous visit (from the past 48 hour(s)).   Blood Alcohol level:  Lab Results  Component Value Date   ETH <10 04/08/2019   ETH <10 10/18/2017  Metabolic Disorder Labs: Lab Results  Component Value Date   HGBA1C 6.5 (H) 05/13/2023   MPG 139.85 05/13/2023   MPG 122.63 09/13/2020   Lab Results  Component Value Date   PROLACTIN 75.7 (H) 05/13/2023   PROLACTIN 13.0 09/13/2020   Lab Results  Component Value Date   CHOL 231 (H) 05/13/2023   TRIG 170 (H) 05/13/2023   HDL 48 05/13/2023   CHOLHDL 4.8 05/13/2023   VLDL 34 05/13/2023   LDLCALC 149 (H) 05/13/2023   LDLCALC 114 (H) 09/13/2020    Physical Findings:  No EPS noted.   Musculoskeletal: Strength & Muscle Tone: within normal limits Gait & Station: normal Patient leans: N/A  Psychiatric Specialty Exam:  Presentation  General Appearance:  Appropriate for Environment  Eye Contact: Good  Speech: Clear and Coherent  Speech Volume: Normal  Handedness: Right   Mood and Affect  Mood: Euthymic  Affect: Restricted   Thought Process  Thought Processes: Linear  Descriptions of Associations:Intact  Orientation:Full (Time, Place and Person)  Thought Content:Logical  History of Schizophrenia/Schizoaffective disorder:No  Duration of Psychotic Symptoms:N/A  Hallucinations:Hallucinations: None  Ideas of Reference:None  Suicidal Thoughts:Suicidal Thoughts:  No  Homicidal Thoughts:Homicidal Thoughts: No   Sensorium  Memory: Immediate Good; Recent Good; Remote Good  Judgment: Fair  Insight: Fair   Art therapist  Concentration: Fair  Attention Span: Fair  Recall: [employer] of Knowledge: Fair  Language: Fair   Psychomotor Activity  Normal   Agricultural engineer; Desire for Improvement; Financial Resources/Insurance; Housing; Leisure Time; Physical Health; Social Support   Sleep  Sleep: Good Number of Hours of Sleep: 8   Physical Exam Vitals reviewed.  Constitutional:      Appearance: She is normal weight.  Pulmonary:     Effort: Pulmonary effort is normal.  Neurological:     Mental Status: She is alert.     Motor: No weakness.     Gait: Gait normal.  Psychiatric:        Behavior: Behavior normal.    Review of Systems  Constitutional:  Negative for chills and fever.  Cardiovascular:  Negative for chest pain and palpitations.  Neurological:  Negative for dizziness, tingling, tremors and headaches.  Psychiatric/Behavioral:  Positive for depression. Negative for hallucinations, memory loss, substance abuse and suicidal ideas. The patient is nervous/anxious. The patient does not have insomnia.   All other systems reviewed and are negative.  Blood pressure (!) 109/52, pulse (!) 104, temperature 98.7 F (37.1 C), temperature source Oral, resp. rate 16, height 5\' 3"  (1.6 m), weight (!) 155.1 kg, SpO2 95%, unknown if currently breastfeeding. Body mass index is 60.58 kg/m.   Treatment Plan Summary: Daily contact with patient to assess and evaluate symptoms and progress in treatment and Medication management   ASSESSMENT:    Diagnoses / Active Problems: Schizoaffective disorder, bipolar type, mixed episode Posttraumatic stress disorder Hyperprolactinemia   PLAN:   Safety and Monitoring: -  VOLUNTARY  admission to inpatient psychiatric unit for safety, stabilization and treatment. -  Daily contact with patient to assess and evaluate symptoms and progress in treatment - Patient's case to be discussed in multi-disciplinary team meeting -  Observation Level : q15 minute checks -  Vital signs:  q12 hours -  Precautions: suicide, elopement, and assault   2. Psychiatric Diagnoses and Treatment:     # Schizoaffective disorder, bipolar type, mixed episode # Posttraumatic stress disorder - Continue Abilify 10 mg qAM, patient is now entirely off of Risperdal  1 mg.  Will likely stay for another day or two to ensure Abilify 10 mg is sufficient.  We will continue to monitor and titrate as needed.  - The risks/benefits/side-effects/alternatives to this medication were discussed in detail with the patient and time was given for questions. The patient consents to medication trial.  - Metabolic profile and EKG monitoring obtained while on an atypical antipsychotic  BMI: 60 TSH: 1.172 Lipid panel: LDL 149 HbgA1c: 6.5 QTc: 399 Prolactin: Awaiting - Encouraged patient to participate in unit milieu and in scheduled group therapies  - Short Term Goals: Ability to maintain clinical measurements within normal limits will improve - Long Term Goals: Improvement in symptoms so as ready for discharge   Other PRNS: Anxiety, agitation, sleep, minor pain   Other labs reviewed on admission: CMP WNL, MCV of 70.7 (baseline appears to be in the high 60s/low 70s)              3. Medical Issues Being Addressed:    # Hyperprolactinemia  -Transitioning from Risperdal to Abilify, now entirely off Risperdal   # T2DM - Continue Metformin 500 mg    4. Discharge Planning:    - Estimated discharge date: 11/27-11/29  - Social work and case management to assist with discharge planning and identification of hospital follow-up needs prior to discharge. - Discharge concerns: Need to establish a safety plan; medication compliance and effectiveness. - Discharge goals: Return home with outpatient referrals  for mental health follow-up including medication management/psychotherapy.  Tomie China, MD 05/17/2023, 12:36 PM

## 2023-05-17 NOTE — Group Note (Signed)
Recreation Therapy Group Note   Group Topic:Animal Assisted Therapy   Group Date: 05/17/2023 Start Time: 0950 End Time: 1030 Facilitators: Jayro Mcmath-McCall, LRT,CTRS Location: [address] Dayroom   Animal-Assisted Activity (AAA) Program Checklist/Progress Notes Patient Eligibility Criteria Checklist & Daily Group note for Rec Tx Intervention  AAA/T Program Assumption of Risk Form signed by Patient/ or Parent Legal Guardian Yes  Patient is free of allergies or severe asthma Yes  Patient reports no fear of animals Yes  Patient reports no history of cruelty to animals Yes  Patient understands his/her participation is voluntary Yes  Patient washes hands before animal contact Yes  Patient washes hands after animal contact Yes  Education: Hand Washing, Appropriate Animal Interaction   Education Outcome: Acknowledges education.    Affect/Mood: Appropriate   Participation Level: Moderate   Participation Quality: Independent   Behavior: Appropriate and On-looking   Speech/Thought Process: Focused   Insight: Good   Judgement: Good   Modes of Intervention: Teaching laboratory technician   Patient Response to Interventions:  Receptive   Education Outcome:  In group clarification offered    Clinical Observations/Individualized Feedback: Pt an interaction with therapy dog team. Pt asked questions that were relevant to the activity. Pt was also social with peers.    Plan: Continue to engage patient in RT group sessions 2-3x/week.   Eduardo Honor-McCall, LRT,CTRS 05/17/2023 12:38 PM

## 2023-05-18 DIAGNOSIS — F250 Schizoaffective disorder, bipolar type: Secondary | ICD-10-CM | POA: Diagnosis not present

## 2023-05-18 NOTE — Progress Notes (Signed)
   05/18/23 0556  15 Minute Checks  Location Bedroom  Visual Appearance Calm  Behavior Sleeping  Sleep (Behavioral Health Patients Only)  Calculate sleep? (Click Yes once per 24 hr at 0600 safety check) Yes  Documented sleep last 24 hours 5.25

## 2023-05-18 NOTE — Progress Notes (Signed)
   05/17/23 2114  Vitals  BP (!) 186/89  MAP (mmHg) 112  BP Location Left Arm  BP Method Automatic  Patient Position (if appropriate) Sitting  Pulse Rate 95   Pt with elevated BP and asymptomatic. Attempted to recheck BP manually but pt refused. Moments later, pt spoke with staff and was agreeable for BP recheck. Notified on-call provider. One time order for Clonidine was placed. Pt was educated on importance of monitoring BP and BP medication. Pt was given Clonidine per MAR.

## 2023-05-18 NOTE — Group Note (Signed)
Recreation Therapy Group Note   Group Topic:Team Building  Group Date: 05/18/2023 Start Time: 0935 End Time: 1015 Facilitators: Shauntay Brunelli-McCall, LRT,CTRS Location: [address] Dayroom   Group Topic: Communication, Team Building, Problem Solving  Goal Area(s) Addresses:  Patient will effectively work with peer towards shared goal.  Patient will identify skills used to make activity successful.  Patient will identify how skills used during activity can be used to reach post d/c goals.   Intervention: STEM Activity  Group Description: Straw Bridge. In teams of 3-5, patients were given 15 plastic drinking straws and an equal length of masking tape. Using the materials provided, patients were instructed to build a free standing bridge-like structure to suspend an everyday item (ex: puzzle box) off of the floor or table surface. All materials were required to be used by the team in their design. LRT facilitated post-activity discussion reviewing team process. Patients were encouraged to reflect how the skills used in this activity can be generalized to daily life post discharge.   Education: Pharmacist, community, Scientist, physiological, Discharge Planning   Education Outcome: Acknowledges education/In group clarification offered/Needs additional education.    Affect/Mood: Appropriate   Participation Level: None   Participation Quality: None   Behavior: Appropriate and On-looking   Speech/Thought Process: None   Insight: None   Judgement: None   Modes of Intervention: STEM Activity   Patient Response to Interventions:  Attentive   Education Outcome:  In group clarification offered    Clinical Observations/Individualized Feedback: Pt was bright and attentive. Pt didn't participate in activity due to completing it during previous admission. Pt was social with peers and offered some hints to peers on some of the things they can do. Pt was appropriate during group session.     Plan:  Continue to engage patient in RT group sessions 2-3x/week.   Cadey Bazile-McCall, LRT,CTRS 05/18/2023 12:52 PM

## 2023-05-18 NOTE — [facility] Group Notes (Signed)
[facility] Group Notes:  (Nursing/MHT/Case Management/Adjunct)  Date:  05/18/2023  Time:  9:51 PM  Type of Therapy:  Psychoeducational Skills  Participation Level:  Active  Participation Quality:  Appropriate  Affect:  Appropriate  Cognitive:  Appropriate  Insight:  Improving  Engagement in Group:  Improving  Modes of Intervention:  Education  Summary of Progress/Problems: Patient rated her day as an 8 out of 10. The patient verbalized that she has been experiencing the effects of taking her Abilify for more than a day. She also verbalized that she went to the gym today. Her goal for tomorrow is to discharge and work on limiting the stress in her life.   Lindsay [name] 05/18/2023, 9:51 PM

## 2023-05-18 NOTE — Progress Notes (Addendum)
[facility] MD Progress Note  05/18/2023 7:13 AM Lindsay [name]  MRN:  [id number]  Subjective:    Lindsay [name] is a 32 y.o. female with a past psychiatric history of schizophrenia, bipolar disorder, anxiety, and depression. Patient initially arrived to [facility] on 11/22 for worsening stress and paranoia, and admitted to [facility] voluntarily on 11/22 for impaired functioning and stabilization of acute on chronic psychiatric conditions. 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   Psych recommendations yesterday: Move Abilify 10mg  to nighttime    PRNs required overnight: Clonidine 0.1mg  x1, hydroxyzine 25mg  x1,   Discussed during bed progression: Slightly tachy to 105.  Blood pressure spiked to 186/89 overnight.  Responded to clonidine 0.1 x1 now 148/90.  Denies SI/HI/AVH.  Pleasant.  On interview, patient is sitting with bed, alert, responsive.  Mood is "fine."  Had questions about high blood pressure.  On sure about starting blood pressure medication while inpatient, said that she may set up PCP visit earlier, or that maybe we could set up an appointment while she is here.  Says that the reading may have been higher as she has bigger upper arms and the cuff may have been too small.  She said that repeat blood pressure on the wrist showed 138/89.  Discussed that we may want to start a blood pressure medication in any case as this is higher than goal for her age.  Asked if she could use her phone under supervision to make a car payment (nursing order placed).  Patient has been trying to sleep as she "does not want to get to manic."  Feeling well today, slept well.  Some complaints overnight about getting blood pressure checked.  Denies paranoid thinking, not ruminating on things.  No suicidal ideation or homicidal ideation.  No auditory/visual hallucinations.  On collateral call with mother, Shelsea Brunken [phone]): Collateral informed that patient will be  discharged tomorrow in all likelihood.  Patient would be staying with mom if discharged tomorrow.  Will be visiting sister at some point in the next few days.  No acute safety concerns.  There are firearms in mother's house, however, she keeps them locked up and patient does not have access to them.  No new questions. Advised to bring patient to [facility] or call [phone] if necessary.   Principal Problem: Schizoaffective disorder, bipolar type (HCC) Diagnosis: Principal Problem:   Schizoaffective disorder, bipolar type (HCC) Active Problems:   Hyperprolactinemia (HCC)  Total Time spent with patient: 20 minutes  Past Psychiatric History:  Current psychiatrist: Kenney Houseman May. Current therapist: [facility]. Previous psychiatric diagnoses: schizophrenia (2019), bipolar disorder, depression, anxiety. Current psychiatric medications: wellbutrin 150mg  XL, risperdal 4mg  at bedtime, riperdal 2mg  at bedtime PRN. Psychiatric medication history/compliance: Patient is compliant with her medications, has trialed zoloft. Psychiatric hospitalization(s): 2019 (first schizophrenia diagnosis), 2020, 2021. Psychotherapy history: none. Neuromodulation history: none. History of suicide (obtained from HPI): denied. History of homicide or aggression (obtained in HPI): denied.   Past Medical History:  Past Medical History:  Diagnosis Date   Anemia    Asthma    Homozygous alpha thalassemia (HCC)    Hx of chlamydia infection    Hx of scoliosis    Hyperlipidemia    Pregnancy induced hypertension    Schizophrenia (HCC)     Past Surgical History:  Procedure Laterality Date   CESAREAN SECTION N/A 01/25/2019   Procedure: CESAREAN SECTION;  Surgeon: Jaymes Graff, MD;  Location: [facility] LD ORS;  Service: Obstetrics;  Laterality: N/A;   TONSILLECTOMY     Family History:  Family History  Problem Relation Age of Onset   Thyroid disease Mother    Hypertension Mother    Cancer Father        liver   Diabetes Maternal  Grandmother    Cancer Maternal Grandmother    Family Psychiatric  History:  Psychiatric diagnoses: Schizophrenia in an uncle, unspecified major mental illness in another uncle/aunt. Suicide history: Yes, an uncle.  Violence/aggression: Endorsed some violence and aggression during family fights.   Social History:  Social History   Substance and Sexual Activity  Alcohol Use No     Social History   Substance and Sexual Activity  Drug Use No    Social History   Socioeconomic History   Marital status: Single    Spouse name: Not on file   Number of children: Not on file   Years of education: Not on file   Highest education level: Not on file  Occupational History   Not on file  Tobacco Use   Smoking status: Never   Smokeless tobacco: Never  Vaping Use   Vaping status: Never Used  Substance and Sexual Activity   Alcohol use: No   Drug use: No   Sexual activity: Yes    Birth control/protection: None  Other Topics Concern   Not on file  Social History Narrative   Not on file   Social Determinants of Health   Financial Resource Strain: Low Risk  (01/18/2019)   Overall Financial Resource Strain (CARDIA)    Difficulty of Paying Living Expenses: Not hard at all  Food Insecurity: No Food Insecurity (05/14/2023)   Hunger Vital Sign    Worried About Running Out of Food in the Last Year: Never true    Ran Out of Food in the Last Year: Never true  Transportation Needs: No Transportation Needs (05/14/2023)   PRAPARE - Administrator, Civil Service (Medical): No    Lack of Transportation (Non-Medical): No  Physical Activity: Not on file  Stress: No Stress Concern Present (01/18/2019)   [employer] of Occupational Health - Occupational Stress Questionnaire    Feeling of Stress : Not at all  Social Connections: Not on file    Current Medications: Current Facility-Administered Medications  Medication Dose Route Frequency Provider Last Rate Last Admin    acetaminophen (TYLENOL) tablet 650 mg  650 mg Oral Q6H PRN Ajibola, Ene A, NP       alum & mag hydroxide-simeth (MAALOX/MYLANTA) 200-200-20 MG/5ML suspension 30 mL  30 mL Oral Q4H PRN Ajibola, Ene A, NP       ARIPiprazole (ABILIFY) tablet 10 mg  10 mg Oral QHS Attiah, Nadir, MD       haloperidol (HALDOL) tablet 5 mg  5 mg Oral TID PRN Massengill, Harrold Donath, MD       And   diphenhydrAMINE (BENADRYL) capsule 50 mg  50 mg Oral TID PRN Massengill, Harrold Donath, MD       haloperidol lactate (HALDOL) injection 5 mg  5 mg Intramuscular TID PRN Massengill, Harrold Donath, MD       And   diphenhydrAMINE (BENADRYL) injection 50 mg  50 mg Intramuscular TID PRN Massengill, Harrold Donath, MD       And   LORazepam (ATIVAN) injection 2 mg  2 mg Intramuscular TID PRN Massengill, Harrold Donath, MD       haloperidol lactate (HALDOL) injection 10 mg  10 mg Intramuscular TID PRN Massengill, Harrold Donath,  MD       And   diphenhydrAMINE (BENADRYL) injection 50 mg  50 mg Intramuscular TID PRN Massengill, Harrold Donath, MD       And   LORazepam (ATIVAN) injection 2 mg  2 mg Intramuscular TID PRN Massengill, Harrold Donath, MD       hydrOXYzine (ATARAX) tablet 25 mg  25 mg Oral TID PRN Ajibola, Ene A, NP   25 mg at 05/18/23 0026   magnesium hydroxide (MILK OF MAGNESIA) suspension 30 mL  30 mL Oral Daily PRN Ajibola, Ene A, NP       metFORMIN (GLUCOPHAGE) tablet 500 mg  500 mg Oral Q breakfast Tomie China, MD   500 mg at 05/17/23 0747   traZODone (DESYREL) tablet 50 mg  50 mg Oral QHS PRN Ajibola, Ene A, NP        Lab Results:  No results found for this or any previous visit (from the past 48 hour(s)).   Blood Alcohol level:  Lab Results  Component Value Date   ETH <10 04/08/2019   ETH <10 10/18/2017    Metabolic Disorder Labs: Lab Results  Component Value Date   HGBA1C 6.5 (H) 05/13/2023   MPG 139.85 05/13/2023   MPG 122.63 09/13/2020   Lab Results  Component Value Date   PROLACTIN 75.7 (H) 05/13/2023   PROLACTIN 13.0 09/13/2020   Lab  Results  Component Value Date   CHOL 231 (H) 05/13/2023   TRIG 170 (H) 05/13/2023   HDL 48 05/13/2023   CHOLHDL 4.8 05/13/2023   VLDL 34 05/13/2023   LDLCALC 149 (H) 05/13/2023   LDLCALC 114 (H) 09/13/2020    Physical Findings:  No EPS noted.   Musculoskeletal: Strength & Muscle Tone: within normal limits Gait & Station: normal Patient leans: N/A  Psychiatric Specialty Exam:  Presentation  General Appearance:  Appropriate for Environment  Eye Contact: Good  Speech: Clear and Coherent  Speech Volume: Normal  Handedness: Right   Mood and Affect  Mood: Euthymic  Affect: Restricted   Thought Process  Thought Processes: Linear  Descriptions of Associations:Intact  Orientation:Full (Time, Place and Person)  Thought Content:Logical  History of Schizophrenia/Schizoaffective disorder:No  Duration of Psychotic Symptoms:N/A  Hallucinations:Hallucinations: None  Ideas of Reference:None  Suicidal Thoughts:Suicidal Thoughts: No  Homicidal Thoughts:Homicidal Thoughts: No   Sensorium  Memory: Immediate Good; Recent Good; Remote Good  Judgment: Fair  Insight: Fair   Art therapist  Concentration: Fair  Attention Span: Fair  Recall: [employer] of Knowledge: Fair  Language: Fair   Psychomotor Activity  Normal   Agricultural engineer; Desire for Improvement; Financial Resources/Insurance; Housing; Leisure Time; Physical Health; Social Support   Sleep  Sleep: Good Number of Hours of Sleep: 8   Physical Exam Vitals reviewed.  Constitutional:      Appearance: She is normal weight.  Pulmonary:     Effort: Pulmonary effort is normal.  Neurological:     Mental Status: She is alert.     Motor: No weakness.     Gait: Gait normal.  Psychiatric:        Behavior: Behavior normal.    Review of Systems  Constitutional:  Negative for chills and fever.  Cardiovascular:  Negative for chest pain and palpitations.   Neurological:  Negative for dizziness, tingling, tremors and headaches.  Psychiatric/Behavioral:  Positive for depression. Negative for hallucinations, memory loss, substance abuse and suicidal ideas. The patient is nervous/anxious. The patient does not have insomnia.   All other systems  reviewed and are negative.  Blood pressure (!) 148/90, pulse 98, temperature 98.7 F (37.1 C), temperature source Oral, resp. rate 16, height 5\' 3"  (1.6 m), weight (!) 155.1 kg, SpO2 97%, unknown if currently breastfeeding. Body mass index is 60.58 kg/m.   Treatment Plan Summary: Daily contact with patient to assess and evaluate symptoms and progress in treatment and Medication management   ASSESSMENT:    Diagnoses / Active Problems: Schizoaffective disorder, bipolar type, mixed episode Posttraumatic stress disorder Hyperprolactinemia   PLAN:   Safety and Monitoring: -  VOLUNTARY  admission to inpatient psychiatric unit for safety, stabilization and treatment. - Daily contact with patient to assess and evaluate symptoms and progress in treatment - Patient's case to be discussed in multi-disciplinary team meeting -  Observation Level : q15 minute checks -  Vital signs:  q12 hours -  Precautions: suicide, elopement, and assault   2. Psychiatric Diagnoses and Treatment:     # Schizoaffective disorder, bipolar type, mixed episode # Posttraumatic stress disorder - Continue Abilify 10 mg at bedtime. Likely discharge tomorrow. - The risks/benefits/side-effects/alternatives to this medication were discussed in detail with the patient and time was given for questions. The patient consents to medication trial.  - Metabolic profile and EKG monitoring obtained while on an atypical antipsychotic  BMI: 60 TSH: 1.172 Lipid panel: LDL 149 HbgA1c: 6.5 QTc: 399 Prolactin: Awaiting - Encouraged patient to participate in unit milieu and in scheduled group therapies  - Short Term Goals: Ability to maintain  clinical measurements within normal limits will improve - Long Term Goals: Improvement in symptoms so as ready for discharge   Other PRNS: Anxiety, agitation, sleep, minor pain   Other labs reviewed on admission: CMP WNL, MCV of 70.7 (baseline appears to be in the high 60s/low 70s)              3. Medical Issues Being Addressed:    #Hypertension - Per hospitalist recommendation, it would be acceptable to start amlodipine 5 mg daily before discharge.  Spoke with patient about this, patient declined.  Will follow up BP control with PCP.  # Hyperprolactinemia  -Transitioning from Risperdal to Abilify, now entirely off Risperdal   # T2DM - Continue Metformin 500 mg    4. Discharge Planning:    - Estimated discharge date: 11/28  - Social work and case management to assist with discharge planning and identification of hospital follow-up needs prior to discharge. - Discharge concerns: Need to establish a safety plan; medication compliance and effectiveness. - Discharge goals: Return home with outpatient referrals for mental health follow-up including medication management/psychotherapy.  Tomie China, MD 05/18/2023, 7:13 AM

## 2023-05-18 NOTE — Plan of Care (Signed)
Problem: Safety: Goal: Periods of time without injury will increase Outcome: Progressing

## 2023-05-18 NOTE — Progress Notes (Signed)
   05/18/23 0800  Psych Admission Type (Psych Patients Only)  Admission Status Voluntary  Psychosocial Assessment  Patient Complaints Anxiety  Eye Contact Fair  Facial Expression Anxious  Affect Anxious  Speech Logical/coherent  Interaction Assertive  Motor Activity Slow  Appearance/Hygiene Unremarkable  Behavior Characteristics Cooperative;Appropriate to situation  Mood Anxious  Thought Process  Coherency Circumstantial  Content WDL  Delusions WDL  Perception WDL  Hallucination None reported or observed  Judgment Impaired  Confusion None  Danger to Self  Current suicidal ideation? Denies  Agreement Not to Harm Self Yes  Description of Agreement Verbal  Danger to Others  Danger to Others None reported or observed

## 2023-05-18 NOTE — Progress Notes (Signed)
   05/17/23 2140  Psych Admission Type (Psych Patients Only)  Admission Status Voluntary  Psychosocial Assessment  Patient Complaints Anxiety  Eye Contact Fair  Facial Expression Anxious  Affect Anxious  Speech Logical/coherent  Interaction Assertive  Motor Activity Slow  Appearance/Hygiene Unremarkable  Behavior Characteristics Cooperative;Appropriate to situation  Mood Anxious  Thought Process  Coherency Circumstantial  Content WDL  Delusions WDL  Perception WDL  Hallucination None reported or observed  Judgment Impaired  Confusion None  Danger to Self  Current suicidal ideation? Denies  Agreement Not to Harm Self Yes  Description of Agreement verbal  Danger to Others  Danger to Others None reported or observed

## 2023-05-19 DIAGNOSIS — F250 Schizoaffective disorder, bipolar type: Secondary | ICD-10-CM

## 2023-05-19 MED ORDER — AMLODIPINE BESYLATE 5 MG PO TABS
5.0000 mg | ORAL_TABLET | Freq: Every day | ORAL | Status: DC
  Administered 2023-05-19: 5 mg via ORAL
  Filled 2023-05-19 (×4): qty 1

## 2023-05-19 MED ORDER — ARIPIPRAZOLE 10 MG PO TABS: 10.0000 mg | ORAL_TABLET | Freq: Every day | ORAL | 0 refills | Status: DC

## 2023-05-19 MED ORDER — HYDROXYZINE HCL 25 MG PO TABS: 25.0000 mg | ORAL_TABLET | Freq: Three times a day (TID) | ORAL | 0 refills | Status: DC | PRN

## 2023-05-19 MED ORDER — AMLODIPINE BESYLATE 5 MG PO TABS: 5.0000 mg | ORAL_TABLET | Freq: Every day | ORAL | 0 refills | Status: DC

## 2023-05-19 NOTE — Progress Notes (Signed)
   05/19/23 0900  Psych Admission Type (Psych Patients Only)  Admission Status Voluntary  Psychosocial Assessment  Patient Complaints None  Eye Contact Fair  Facial Expression Anxious  Affect Anxious  Speech Incoherent  Interaction Assertive  Motor Activity Slow  Appearance/Hygiene Unremarkable  Behavior Characteristics Cooperative;Appropriate to situation  Mood Anxious  Thought Process  Coherency Circumstantial  Content WDL  Delusions None reported or observed  Perception WDL  Hallucination None reported or observed  Judgment Impaired  Confusion None  Danger to Self  Current suicidal ideation? Denies  Agreement Not to Harm Self Yes  Description of Agreement Verbal  Danger to Others  Danger to Others None reported or observed

## 2023-05-19 NOTE — Progress Notes (Signed)
D: Patient verbalizes readiness for discharge, denies suicidal and homicidal ideations, denies auditory and visual hallucinations.  No complaints of pain. Suicide Safety Plan completed and copy placed in the chart.  Amlodapine 5mg  PO given at 1008, education provided prior to administration. Repeat BP at 11am, improvement noted and provider aware. Pt will followup with Primary Provider for BP management.  A:  Pt. receptive to discharge instructions. Questions encouraged, both verbalize understanding.  R:  Escorted to the lobby by this RN.

## 2023-05-19 NOTE — Plan of Care (Signed)

## 2023-05-19 NOTE — Discharge Summary (Signed)
Physician Discharge Summary Note  Patient:  Lindsay [name] is an 32 y.o., female MRN:  [id number] DOB:  April 06, 1991 Patient phone:  [phone] (home)  Patient address:   [address] [place] [address],  Total Time spent with patient: 45 minutes  Date of Admission:  05/13/2023 Date of Discharge: 05/19/2023  Reason for Admission:  "Experiencing a lot of stress leading to paranoid thoughts"   Lindsay [name] is a 32 y.o. female  with a past psychiatric history of schizophrenia, bipolar disorder, anxiety, and depression. Patient initially arrived to [facility] on 11/22 for worsening stress and paranoia, and admitted to [facility] voluntarily on 11/22 for impaired functioning and stabilization of acute on chronic psychiatric conditions. 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  Principal Problem: Schizoaffective disorder, bipolar type [facility]) Discharge Diagnoses: Principal Problem:   Schizoaffective disorder, bipolar type (HCC) Active Problems:   Hyperprolactinemia [facility])   Past Psychiatric History:  Current psychiatrist: Kenney Houseman May. Current therapist: [facility]. Previous psychiatric diagnoses: schizophrenia (2019), bipolar disorder, depression, anxiety. Current psychiatric medications: wellbutrin 150mg  XL, risperdal 4mg  at bedtime, riperdal 2mg  at bedtime PRN. Psychiatric medication history/compliance: Patient is compliant with her medications, has trialed zoloft. Psychiatric hospitalization(s): 2019 (first schizophrenia diagnosis), 2020, 2021. Psychotherapy history: none. Neuromodulation history: none. History of suicide (obtained from HPI): denied. History of homicide or aggression (obtained in HPI): denied.  Past Medical History:  Past Medical History:  Diagnosis Date   Anemia    Asthma    Homozygous alpha thalassemia (HCC)    Hx of chlamydia infection    Hx of scoliosis    Hyperlipidemia    Pregnancy induced  hypertension    Schizophrenia (HCC)     Past Surgical History:  Procedure Laterality Date   CESAREAN SECTION N/A 01/25/2019   Procedure: CESAREAN SECTION;  Surgeon: Jaymes Graff, MD;  Location: [facility] LD ORS;  Service: Obstetrics;  Laterality: N/A;   TONSILLECTOMY      Family History:  Family History  Problem Relation Age of Onset   Thyroid disease Mother    Hypertension Mother    Cancer Father        liver   Diabetes Maternal Grandmother    Cancer Maternal Grandmother    Family Psychiatric  History:  Psychiatric diagnoses: Schizophrenia in an uncle, unspecified major mental illness in another uncle/aunt. Suicide history: Yes, an uncle.  Violence/aggression: Endorsed some violence and aggression during family fights. Social History:  Social History   Substance and Sexual Activity  Alcohol Use No     Social History   Substance and Sexual Activity  Drug Use No    Social History   Socioeconomic History   Marital status: Single    Spouse name: Not on file   Number of children: Not on file   Years of education: Not on file   Highest education level: Not on file  Occupational History   Not on file  Tobacco Use   Smoking status: Never   Smokeless tobacco: Never  Vaping Use   Vaping status: Never Used  Substance and Sexual Activity   Alcohol use: No   Drug use: No   Sexual activity: Yes    Birth control/protection: None  Other Topics Concern   Not on file  Social History Narrative   Not on file   Social Determinants of Health   Financial Resource Strain: Low Risk  (01/18/2019)   Overall Financial Resource Strain (CARDIA)  Difficulty of Paying Living Expenses: Not hard at all  Food Insecurity: No Food Insecurity (05/14/2023)   Hunger Vital Sign    Worried About Running Out of Food in the Last Year: Never true    Ran Out of Food in the Last Year: Never true  Transportation Needs: No Transportation Needs (05/14/2023)   PRAPARE - Scientist, research (physical sciences) (Medical): No    Lack of Transportation (Non-Medical): No  Physical Activity: Not on file  Stress: No Stress Concern Present (01/18/2019)   [employer] of Occupational Health - Occupational Stress Questionnaire    Feeling of Stress : Not at all  Social Connections: Not on file   Living situation: At home with son. Education: Bachelor's degree in social work. Occupational history: Currently works at [employer]. Marital status: Separated. Children: Young son, elementary school age. Legal: none. Military: none.   Access to firearms: none.  Substance Use History:  Alcohol: Former, 2+ years ago. Tobacco: Former, 2+ years ago. Cannabis: Former, 2+ years ago. IV drug use: none. Prescription drug use: none. Other illicit drugs: none. Rehab history: none.  Hospital Course:   During the patient's hospitalization, patient had extensive initial psychiatric evaluation, and follow-up psychiatric evaluations every day.   Psychiatric diagnoses provided upon initial assessment: Schizoaffective disorder, bipolar type, mixed episode Posttraumatic stress disorder Hyperprolactinemia   Patient's psychiatric medications were adjusted on admission:  - We believe that schizoaffective disorder, bipolar type is the most appropriate diagnosis for this patient's presentation at this point in time.  Plan to transition patient from Risperdal to Abilify in the setting of significant side effect profile.  We will monitor patient closely and adjust as necessary. - Start Risperdal taper: 3 mg nightly on 11/23, 2 mg nightly 11/24, 1 mg nightly 11/24 - Start Abilify 10mg  at bedtime tomorrow 11/24, already received 10mg  x1 in early PM 11/23   During the hospitalization, other adjustments were made to the patient's psychiatric medication regimen: Abilify was continued 10 mg at bedtime for mood and psychosis, later during hospital stay patient was started on Norvasc 5 mg daily for high blood  pressure, atarax was continued prn for anxiety, was used effectively and well tolerated, trazodone prn for sleep was never needed or used   Patient's care was discussed during the interdisciplinary team meeting every day during the hospitalization.   The patient denied having side effects to prescribed psychiatric medication.   Gradually, patient started adjusting to milieu. The patient was evaluated each day by a clinical provider to ascertain response to treatment. Improvement was noted by the patient's report of decreasing symptoms, improved sleep and appetite, affect, medication tolerance, behavior, and participation in unit programming.  Patient was asked each day to complete a self inventory noting mood, mental status, pain, new symptoms, anxiety and concerns.     Symptoms were reported as significantly decreased or resolved completely by discharge.    On day of discharge, patient was evaluated on 05/19/2023 the patient reports that their mood is stable. The patient denied having suicidal thoughts for more than 48 hours prior to discharge.  Patient denies having homicidal thoughts.  Patient denies having auditory hallucinations.  Patient denies any visual hallucinations or other symptoms of psychosis. The patient was motivated to continue taking medication with a goal of continued improvement in mental health.    The patient reports their target psychiatric symptoms of paranoia and psychosis responded well to the psychiatric medications, also patient reported improved side effects that were mainly  sec to risperidone including increased appetite and hyper prolactinemia, and the patient reports overall benefit other psychiatric hospitalization. Supportive psychotherapy was provided to the patient. The patient also participated in regular group therapy while hospitalized. Coping skills, problem solving as well as relaxation therapies were also part of the unit programming.   Labs were reviewed with  the patient, and abnormal results were discussed with the patient.   The patient is able to verbalize their individual safety plan to this provider.   Behavioral Events: None   Restraints: None   Groups: attended and participated   Medications Changes: As above     Sleep  Sleep:Sleep: Good Sleep improved during hospital stay  Physical Findings: AIMS: Facial and Oral Movements Muscles of Facial Expression: None Lips and Perioral Area: None Jaw: None Tongue: None,Extremity Movements Upper (arms, wrists, hands, fingers): None Lower (legs, knees, ankles, toes): None, Trunk Movements Neck, shoulders, hips: None, Global Judgements Severity of abnormal movements overall : None Incapacitation due to abnormal movements: None Patient's awareness of abnormal movements: No Awareness, Dental Status Current problems with teeth and/or dentures?: No Does patient usually wear dentures?: No Edentia?: No  CIWA:    COWS:     Musculoskeletal: Strength & Muscle Tone: within normal limits Gait & Station: normal Patient leans: N/A   Psychiatric Specialty Exam:  General Appearance: appears at stated age, fairly dressed and groomed  Behavior: pleasant and cooperative  Psychomotor Activity:No psychomotor agitation or retardation noted   Eye Contact: good Speech: normal amount, tone, volume and latency   Mood: euthymic Affect: congruent, pleasant and interactive  Thought Process: linear, goal directed, no circumstantial or tangential thought process noted, no racing thoughts or flight of ideas Descriptions of Associations: intact Thought Content: Hallucinations: denies AH, VH , does not appear responding to stimuli Delusions: No paranoia or other delusions noted Suicidal Thoughts: denies SI, intention, plan  Homicidal Thoughts: denies HI, intention, plan   Alertness/Orientation: alert and fully oriented  Insight: fair, improved Judgment: fair, improved  Memory:  intact  Executive Functions  Concentration: intact  Attention Span: Fair Recall: intact Fund of Knowledge: fair   Assets  Assets: Manufacturing systems engineer; Desire for Improvement; Financial Resources/Insurance; Housing; Leisure Time; Physical Health; Social Support   Sleep Sleep: Sleep: Good     Physical Exam:  Physical Exam Vitals and nursing note reviewed.  Constitutional:      Appearance: Normal appearance.  HENT:     Head: Normocephalic and atraumatic.     Nose: Nose normal.  Eyes:     Extraocular Movements: Extraocular movements intact.  Pulmonary:     Effort: Pulmonary effort is normal.  Musculoskeletal:        General: Normal range of motion.     Cervical back: Normal range of motion.  Neurological:     General: No focal deficit present.     Mental Status: She is alert and oriented to person, place, and time. Mental status is at baseline.  Psychiatric:        Mood and Affect: Mood normal.        Behavior: Behavior normal.        Thought Content: Thought content normal.        Judgment: Judgment normal.    Review of Systems  All other systems reviewed and are negative.  Blood pressure (!) 146/85, pulse (!) 115, temperature 98.3 F (36.8 C), temperature source Oral, resp. rate 16, height 5\' 3"  (1.6 m), weight (!) 155.1 kg, SpO2 97%, unknown if currently  breastfeeding. Body mass index is 60.58 kg/m.   Social History   Tobacco Use  Smoking Status Never  Smokeless Tobacco Never   Tobacco Cessation:  N/A, patient does not currently use tobacco products   Blood Alcohol level:  Lab Results  Component Value Date   ETH <10 04/08/2019   ETH <10 10/18/2017    Metabolic Disorder Labs:  Lab Results  Component Value Date   HGBA1C 6.5 (H) 05/13/2023   MPG 139.85 05/13/2023   MPG 122.63 09/13/2020   Lab Results  Component Value Date   PROLACTIN 75.7 (H) 05/13/2023   PROLACTIN 13.0 09/13/2020   Lab Results  Component Value Date   CHOL 231 (H)  05/13/2023   TRIG 170 (H) 05/13/2023   HDL 48 05/13/2023   CHOLHDL 4.8 05/13/2023   VLDL 34 05/13/2023   LDLCALC 149 (H) 05/13/2023   LDLCALC 114 (H) 09/13/2020    See Psychiatric Specialty Exam and Suicide Risk Assessment completed by Attending Physician prior to discharge.  Discharge destination:  Home with family  Is patient on multiple antipsychotic therapies at discharge:  No   Has Patient had three or more failed trials of antipsychotic monotherapy by history:  No  Recommended Plan for Multiple Antipsychotic Therapies: NA  Discharge Instructions     Diet - low sodium heart healthy   Complete by: As directed    Increase activity slowly   Complete by: As directed       Allergies as of 05/19/2023       Reactions   Naproxen    Vertigo   Penicillins Hives        Medication List     STOP taking these medications    Accu-Chek Guide test strip Generic drug: glucose blood   Accu-Chek Softclix Lancets lancets   buPROPion 150 MG 24 hr tablet Commonly known as: WELLBUTRIN XL   cetirizine 10 MG tablet Commonly known as: ZYRTEC   ibuprofen 800 MG tablet Commonly known as: ADVIL   risperiDONE 2 MG tablet Commonly known as: RISPERDAL   risperidone 4 MG tablet Commonly known as: RISPERDAL   Ventolin HFA 108 (90 Base) MCG/ACT inhaler Generic drug: albuterol   Vitamin D (Ergocalciferol) 1.25 MG (50000 UNIT) Caps capsule Commonly known as: DRISDOL       TAKE these medications      Indication  amLODipine 5 MG tablet Commonly known as: NORVASC Take 1 tablet (5 mg total) by mouth daily.  Indication: High Blood Pressure   ARIPiprazole 10 MG tablet Commonly known as: ABILIFY Take 1 tablet (10 mg total) by mouth at bedtime.  Indication: Schizophrenia   hydrOXYzine 25 MG tablet Commonly known as: ATARAX Take 1 tablet (25 mg total) by mouth 3 (three) times daily as needed for anxiety. What changed:  medication strength how much to take  Indication:  Feeling Anxious   metFORMIN 500 MG tablet Commonly known as: GLUCOPHAGE Take 500 mg by mouth daily.  Indication: Type 2 Diabetes        Follow-up Information     [facility]. Go on 06/02/2023.   Why: Next appointment with psychiatrist is on 06/02/23 at 4:00 pm, in person. Next appointment with therapist is on 06/24/23 at 10:30 am, Virtual appt.  Next appointment with primary care physician is on 06/07/23 at 4:00 pm (for bp check), and 08/30/23 at 10:00 am (fasting), in person. Contact information: [address] [place] [address] [phone]         Services,  [facility] Follow up.   Why: Please call number listed if you are interested in services for your child. Contact information: [address] [place] [address] [phone]         [facility] Follow up.   Why: Your [place] provider will work with teachers, daycare providers, and therapists to coordinate care and help develop management plans specific to each child. Depending your child's specific condition, his or her care plan. This agency will treat kids from 18 months to 70 years old. Please call number listed if you are interested in services. Contact information: [address] [place], [place] [address] [phone]                Discharge recommendations:   Activity: as tolerated  Diet: heart healthy  # It is recommended to the patient to continue psychiatric medications as prescribed, after discharge from the hospital.     # It is recommended to the patient to follow up with your outpatient psychiatric provider and PCP.   # It was discussed with the patient, the impact of alcohol, drugs, tobacco have been there overall psychiatric and medical wellbeing, and total abstinence from substance use was recommended the patient.ed.   # Prescriptions provided or sent directly to preferred pharmacy at discharge. Patient agreeable to plan.  Given opportunity to ask questions. Appears to feel comfortable with discharge.    # In the event of worsening symptoms, the patient is instructed to call the crisis hotline, 911 and or go to the nearest ED for appropriate evaluation and treatment of symptoms. To follow-up with primary care provider for other medical issues, concerns and or health care needs   # Patient was discharged home with a plan to follow up as noted above.  -Follow-up with outpatient primary care doctor and other specialists -for management of chronic medical disease, including: Patient was recommended to follow-up with her primary care provider regarding management of diabetes as well as hypertension, she was diagnosed during this hospital stay with hypertension she was primarily reluctant to start any antihypertensive medication but after consulting with hospitalist she agreed on the last day during hospital stay to be started on Norvasc 5 mg daily with recommendation to be monitored by outpatient provider for further management plan.    Patient agrees with D/C instructions and plan.   The patient received suicide prevention pamphlet:  Yes Belongings returned:  Clothing and Valuables  Total Time Spent in Direct Patient Care:  I personally spent 45 minutes on the unit in direct patient care. The direct patient care time included face-to-face time with the patient, reviewing the patient's chart, communicating with other professionals, and coordinating care. Greater than 50% of this time was spent in counseling or coordinating care with the patient regarding goals of hospitalization, psycho-education, and discharge planning needs.    SignedSarita Bottom, MD 05/19/2023, 10:13 AM

## 2023-05-19 NOTE — Plan of Care (Signed)
  Problem: Education: Goal: Knowledge of [facility] Education information/materials will improve 05/19/2023 1042 by Errin Chewning, Macky Lower, RN Outcome: Adequate for Discharge 05/19/2023 1012 by Lovena Neighbours, RN Outcome: Adequate for Discharge Goal: Emotional status will improve 05/19/2023 1042 by Lovena Neighbours, RN Outcome: Adequate for Discharge 05/19/2023 1012 by Lovena Neighbours, RN Outcome: Adequate for Discharge Goal: Mental status will improve 05/19/2023 1042 by Lovena Neighbours, RN Outcome: Adequate for Discharge 05/19/2023 1012 by Lovena Neighbours, RN Outcome: Adequate for Discharge Goal: Verbalization of understanding the information provided will improve 05/19/2023 1042 by Lovena Neighbours, RN Outcome: Adequate for Discharge 05/19/2023 1012 by Lovena Neighbours, RN Outcome: Adequate for Discharge   Problem: Activity: Goal: Interest or engagement in activities will improve 05/19/2023 1042 by Camerin Jimenez, Macky Lower, RN Outcome: Adequate for Discharge 05/19/2023 1012 by Lovena Neighbours, RN Outcome: Adequate for Discharge Goal: Sleeping patterns will improve 05/19/2023 1042 by Kymir Coles, Macky Lower, RN Outcome: Adequate for Discharge 05/19/2023 1012 by Lovena Neighbours, RN Outcome: Adequate for Discharge   Problem: Coping: Goal: Ability to verbalize frustrations and anger appropriately will improve 05/19/2023 1042 by Lovena Neighbours, RN Outcome: Adequate for Discharge 05/19/2023 1012 by Lovena Neighbours, RN Outcome: Adequate for Discharge Goal: Ability to demonstrate self-control will improve 05/19/2023 1042 by Kasir Hallenbeck, Macky Lower, RN Outcome: Adequate for Discharge 05/19/2023 1012 by Lovena Neighbours, RN Outcome: Adequate for Discharge   Problem: Health Behavior/Discharge Planning: Goal: Identification of resources available to assist in meeting health care needs will improve 05/19/2023 1042 by Marcella Charlson, Macky Lower, RN Outcome: Adequate for Discharge 05/19/2023 1012 by Lovena Neighbours, RN Outcome:  Adequate for Discharge Goal: Compliance with treatment plan for underlying cause of condition will improve 05/19/2023 1042 by Lovena Neighbours, RN Outcome: Adequate for Discharge 05/19/2023 1012 by Lovena Neighbours, RN Outcome: Adequate for Discharge   Problem: Physical Regulation: Goal: Ability to maintain clinical measurements within normal limits will improve 05/19/2023 1042 by Lovena Neighbours, RN Outcome: Adequate for Discharge 05/19/2023 1012 by Lovena Neighbours, RN Outcome: Adequate for Discharge   Problem: Safety: Goal: Periods of time without injury will increase 05/19/2023 1042 by Lovena Neighbours, RN Outcome: Adequate for Discharge 05/19/2023 1012 by Lovena Neighbours, RN Outcome: Adequate for Discharge

## 2023-05-19 NOTE — Progress Notes (Signed)
  [facility] Adult Case Management Discharge Plan :  Will you be returning to the same living situation after discharge:  No. Pt will be staying with mother for several days at discharge At discharge, do you have transportation home?: Yes,  pt will be picked up by mother between 11-11:30AM Do you have the ability to pay for your medications: Yes,  pt has [employer] and is employed  Release of information consent forms completed and in the chart;  Patient's signature needed at discharge.  Patient to Follow up at:  Follow-up Information     [facility]. Go on 06/02/2023.   Why: Next appointment with psychiatrist is on 06/02/23 at 4:00 pm, in person. Next appointment with therapist is on 06/24/23 at 10:30 am, Virtual appt.  Next appointment with primary care physician is on 06/07/23 at 4:00 pm (for bp check), and 08/30/23 at 10:00 am (fasting), in person. Contact information: [address] [place] [address] [phone]         Services, [facility] Follow up.   Why: Please call number listed if you are interested in services for your child. Contact information: [address] [place] [address] [phone]         [facility] Follow up.   Why: Your [place] provider will work with teachers, daycare providers, and therapists to coordinate care and help develop management plans specific to each child. Depending your child's specific condition, his or her care plan. This agency will treat kids from 18 months to 46 years old. Please call number listed if you are interested in services. Contact information: [address] [place], [place] [address] [phone]                Next level of care provider has access to [facility] Link:no  Safety Planning and Suicide Prevention discussed: Yes,   mother Hilbert Corrigan [phone]     Has patient been referred to the Quitline?: Patient does not use  tobacco/nicotine products  Patient has been referred for addiction treatment: No known substance use disorder.  Kathi Der, LCSWA 05/19/2023, 9:31 AM

## 2023-05-19 NOTE — Group Note (Signed)
Date:  05/19/2023 Time:  9:12 AM  Group Topic/Focus:  Goals Group:   The focus of this group is to help patients establish daily goals to achieve during treatment and discuss how the patient can incorporate goal setting into their daily lives to aide in recovery. Orientation:   The focus of this group is to educate the patient on the purpose and policies of crisis stabilization and provide a format to answer questions about their admission.  The group details unit policies and expectations of patients while admitted.    Participation Level:  Active  Participation Quality:  Appropriate  Affect:  Appropriate  Cognitive:  Appropriate  Insight: Appropriate  Engagement in Group:  Engaged  Modes of Intervention:  Discussion and Education  Additional Comments:    Lindsay [name] 05/19/2023, 9:12 AM

## 2023-05-19 NOTE — BHH Suicide Risk Assessment (Signed)
[facility] Discharge Suicide Risk Assessment   Principal Problem: Schizoaffective disorder, bipolar type [facility]) Discharge Diagnoses: Principal Problem:   Schizoaffective disorder, bipolar type (HCC) Active Problems:   Hyperprolactinemia (HCC)   Total Time spent with patient: 45 minutes  Reason for admission:  "Experiencing a lot of stress leading to paranoid thoughts"   Lindsay [name] is a 32 y.o. female  with a past psychiatric history of schizophrenia, bipolar disorder, anxiety, and depression. Patient initially arrived to [facility] on 11/22 for worsening stress and paranoia, and admitted to [facility] voluntarily on 11/22 for impaired functioning and stabilization of acute on chronic psychiatric conditions. 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  PTA Medications:  Medications Prior to Admission  Medication Sig Dispense Refill Last Dose   buPROPion (WELLBUTRIN XL) 150 MG 24 hr tablet Take 150 mg by mouth every morning.     05/13/2023   cetirizine (ZYRTEC) 10 MG tablet Take 10 mg by mouth daily as needed for allergies.     Past Month   hydrOXYzine (ATARAX) 50 MG tablet Take 50 mg by mouth 3 (three) times daily as needed for anxiety.     05/13/2023   ibuprofen (ADVIL) 800 MG tablet Take 800 mg by mouth 3 (three) times daily as needed for mild pain (pain score 1-3).     Past Month   metFORMIN (GLUCOPHAGE) 500 MG tablet Take 500 mg by mouth daily.     05/13/2023   risperiDONE (RISPERDAL) 2 MG tablet Take 2 mg by mouth daily as needed (For anxiety).     Past Week   risperidone (RISPERDAL) 4 MG tablet Take 4 mg by mouth at bedtime.     05/13/2023   VENTOLIN HFA 108 (90 Base) MCG/ACT inhaler Inhale 1 puff into the lungs every 4 (four) hours as needed for wheezing or shortness of breath.     Past Month   Vitamin D, Ergocalciferol, (DRISDOL) 1.25 MG (50000 UNIT) CAPS capsule Take 50,000 Units by mouth once a week.     Past Week   ACCU-CHEK GUIDE test  strip USE TO CHECK SUGAR TWICE DAILY         Accu-Chek Softclix Lancets lancets 2 (two) times daily.          Hospital Course:   During the patient's hospitalization, patient had extensive initial psychiatric evaluation, and follow-up psychiatric evaluations every day.  Psychiatric diagnoses provided upon initial assessment: Schizoaffective disorder, bipolar type, mixed episode Posttraumatic stress disorder Hyperprolactinemia  Patient's psychiatric medications were adjusted on admission: - We believe that schizoaffective disorder, bipolar type is the most appropriate diagnosis for this patient's presentation at this point in time.  Plan to transition patient from Risperdal to Abilify in the setting of significant side effect profile.  We will monitor patient closely and adjust as necessary. - Start Risperdal taper: 3 mg nightly on 11/23, 2 mg nightly 11/24, 1 mg nightly 11/24 - Start Abilify 10mg  at bedtime tomorrow 11/24, already received 10mg  x1 in early PM 11/23  During the hospitalization, other adjustments were made to the patient's psychiatric medication regimen: Abilify was continued 10 mg at bedtime for mood and psychosis, later during hospital stay patient was started on Norvasc 5 mg daily for high blood pressure, atarax was continued prn for anxiety, was used effectively and well tolerated, trazodone prn for sleep was never needed or used  Patient's care was discussed during the interdisciplinary team meeting every day during the hospitalization.  The patient denied having side effects to prescribed psychiatric medication.  Gradually, patient started adjusting to milieu. The patient was evaluated each day by a clinical provider to ascertain response to treatment. Improvement was noted by the patient's report of decreasing symptoms, improved sleep and appetite, affect, medication tolerance, behavior, and participation in unit programming.  Patient was asked each day to complete a self  inventory noting mood, mental status, pain, new symptoms, anxiety and concerns.    Symptoms were reported as significantly decreased or resolved completely by discharge.   On day of discharge, patient was evaluated on 05/19/2023 the patient reports that their mood is stable. The patient denied having suicidal thoughts for more than 48 hours prior to discharge.  Patient denies having homicidal thoughts.  Patient denies having auditory hallucinations.  Patient denies any visual hallucinations or other symptoms of psychosis. The patient was motivated to continue taking medication with a goal of continued improvement in mental health.   The patient reports their target psychiatric symptoms of paranoia and psychosis responded well to the psychiatric medications, also patient reported improved side effects that were mainly sec to risperidone including increased appetite and hyper prolactinemia, and the patient reports overall benefit other psychiatric hospitalization. Supportive psychotherapy was provided to the patient. The patient also participated in regular group therapy while hospitalized. Coping skills, problem solving as well as relaxation therapies were also part of the unit programming.  Labs were reviewed with the patient, and abnormal results were discussed with the patient.  The patient is able to verbalize their individual safety plan to this provider.  Behavioral Events: None  Restraints: None  Groups: attended and participated  Medications Changes: As above   Sleep  Sleep:Sleep: Good Sleep improved during hospital stay  Musculoskeletal: Strength & Muscle Tone: within normal limits Gait & Station: normal Patient leans: N/A  Psychiatric Specialty Exam  General Appearance: appears at stated age, fairly dressed and groomed  Behavior: pleasant and cooperative  Psychomotor Activity:No psychomotor agitation or retardation noted   Eye Contact: good Speech: normal amount, tone,  volume and latency   Mood: euthymic Affect: congruent, pleasant and interactive  Thought Process: linear, goal directed, no circumstantial or tangential thought process noted, no racing thoughts or flight of ideas Descriptions of Associations: intact Thought Content: Hallucinations: denies AH, VH , does not appear responding to stimuli Delusions: No paranoia or other delusions noted Suicidal Thoughts: denies SI, intention, plan  Homicidal Thoughts: denies HI, intention, plan   Alertness/Orientation: alert and fully oriented  Insight: fair, improved Judgment: fair, improved  Memory: intact  Executive Functions  Concentration: intact  Attention Span: Fair Recall: intact Fund of Knowledge: fair   Art therapist  Concentration: intact Attention Span: Fair Recall: intact Fund of Knowledge: fair   Assets  Assets: Manufacturing systems engineer; Desire for Improvement; Financial Resources/Insurance; Housing; Leisure Time; Physical Health; Social Support   Physical Exam: Physical Exam ROS Blood pressure (!) 146/85, pulse (!) 115, temperature 98.3 F (36.8 C), temperature source Oral, resp. rate 16, height 5\' 3"  (1.6 m), weight (!) 155.1 kg, SpO2 97%, unknown if currently breastfeeding. Body mass index is 60.58 kg/m.  Mental Status Per Nursing Assessment::   On Admission:  NA  Demographic Factors:  NA  Loss Factors: NA  Historical Factors: NA  Risk Reduction Factors:   Responsible for children under 59 years of age, Sense of responsibility to family, Employed, Living with another person, especially a relative, and Positive social support  Continued Clinical Symptoms: Paranoia and psychosis  improved significantly during hospital stay, patient is back to baseline. Schizophrenia:   Paranoid or undifferentiated type  Cognitive Features That Contribute To Risk:  None    Suicide Risk:  Minimal: No identifiable suicidal ideation.  Patients presenting with no risk  factors but with morbid ruminations; may be classified as minimal risk based on the severity of the depressive symptoms   Follow-up Information     [facility]. Go on 06/02/2023.   Why: Next appointment with psychiatrist is on 06/02/23 at 4:00 pm, in person. Next appointment with therapist is on 06/24/23 at 10:30 am, Virtual appt.  Next appointment with primary care physician is on 06/07/23 at 4:00 pm (for bp check), and 08/30/23 at 10:00 am (fasting), in person. Contact information: [address] [place] [address] [phone]         [facility], [facility] Follow up.   Why: Please call number listed if you are interested in services for your child. Contact information: [address] [place] [address] [phone]         [facility] Follow up.   Why: Your [place] provider will work with teachers, daycare providers, and therapists to coordinate care and help develop management plans specific to each child. Depending your child's specific condition, his or her care plan. This agency will treat kids from 18 months to 90 years old. Please call number listed if you are interested in services. Contact information: [address] [place], [place] [address] [phone]                Plan Of Care/Follow-up recommendations:   Discharge recommendations:     Activity: as tolerated  Diet: heart healthy  # It is recommended to the patient to continue psychiatric medications as prescribed, after discharge from the hospital.     # It is recommended to the patient to follow up with your outpatient psychiatric provider and PCP.   # It was discussed with the patient, the impact of alcohol, drugs, tobacco have been there overall psychiatric and medical wellbeing, and total abstinence from substance use was recommended the patient.ed.   # Prescriptions provided or sent directly to preferred pharmacy at discharge.  Patient agreeable to plan. Given opportunity to ask questions. Appears to feel comfortable with discharge.    # In the event of worsening symptoms, the patient is instructed to call the crisis hotline, 911 and or go to the nearest ED for appropriate evaluation and treatment of symptoms. To follow-up with primary care provider for other medical issues, concerns and or health care needs   # Patient was discharged home with a plan to follow up as noted above.  -Follow-up with outpatient primary care doctor and other specialists -for management of chronic medical disease, including: Patient was recommended to follow-up with her primary care provider regarding management of diabetes as well as hypertension, she was diagnosed during this hospital stay with hypertension she was primarily reluctant to start any antihypertensive medication but after consulting with hospitalist she agreed on the last day during hospital stay to be started on Norvasc 5 mg daily with recommendation to be monitored by outpatient provider for further management plan.   Patient agrees with D/C instructions and plan.  The patient received suicide prevention pamphlet:  Yes Belongings returned:  Clothing and Valuables  Total Time Spent in Direct Patient Care:  I personally spent 45 minutes on the unit in direct patient care. The direct patient care  time included face-to-face time with the patient, reviewing the patient's chart, communicating with other professionals, and coordinating care. Greater than 50% of this time was spent in counseling or coordinating care with the patient regarding goals of hospitalization, psycho-education, and discharge planning needs.   Lindsay [name] 05/19/2023, 9:53 AM   Lucita Montoya Abbott Pao, MD 05/19/2023, 9:53 AM

## 2023-05-19 NOTE — Progress Notes (Signed)
   05/18/23 2046  Psych Admission Type (Psych Patients Only)  Admission Status Voluntary  Psychosocial Assessment  Patient Complaints Anxiety  Eye Contact Fair  Facial Expression Anxious  Affect Anxious  Speech Logical/coherent  Interaction Assertive  Motor Activity Slow  Appearance/Hygiene Unremarkable  Behavior Characteristics Cooperative;Appropriate to situation  Mood Anxious  Thought Process  Coherency WDL  Content WDL  Delusions WDL  Perception WDL  Hallucination None reported or observed  Judgment Impaired  Confusion None  Danger to Self  Current suicidal ideation? Denies  Agreement Not to Harm Self Yes  Description of Agreement verbal  Danger to Others  Danger to Others None reported or observed

## 2023-05-31 ENCOUNTER — Ambulatory Visit: Payer: 59 | Admitting: "Endocrinology

## 2023-06-02 DIAGNOSIS — Z139 Encounter for screening, unspecified: Secondary | ICD-10-CM | POA: Diagnosis not present

## 2023-06-02 DIAGNOSIS — G2401 Drug induced subacute dyskinesia: Secondary | ICD-10-CM | POA: Diagnosis not present

## 2023-06-02 DIAGNOSIS — F250 Schizoaffective disorder, bipolar type: Secondary | ICD-10-CM | POA: Diagnosis not present

## 2023-12-22 ENCOUNTER — Encounter (HOSPITAL_COMMUNITY): Payer: Self-pay | Admitting: Emergency Medicine

## 2023-12-22 ENCOUNTER — Other Ambulatory Visit: Payer: Self-pay

## 2023-12-22 ENCOUNTER — Emergency Department (HOSPITAL_COMMUNITY)
Admission: EM | Admit: 2023-12-22 | Discharge: 2023-12-25 | Disposition: A | Discharge status: Other Acute Inpatient Hospital | Payer: MEDICAID | Attending: Emergency Medicine | Admitting: Emergency Medicine

## 2023-12-22 ENCOUNTER — Ambulatory Visit (HOSPITAL_COMMUNITY)
Admission: EM | Admit: 2023-12-22 | Discharge: 2023-12-22 | Disposition: A | Discharge status: Other Acute Inpatient Hospital | Payer: MEDICAID

## 2023-12-22 DIAGNOSIS — R4586 Emotional lability: Secondary | ICD-10-CM | POA: Diagnosis not present

## 2023-12-22 DIAGNOSIS — R45851 Suicidal ideations: Secondary | ICD-10-CM | POA: Insufficient documentation

## 2023-12-22 DIAGNOSIS — R443 Hallucinations, unspecified: Secondary | ICD-10-CM | POA: Diagnosis present

## 2023-12-22 DIAGNOSIS — Z79899 Other long term (current) drug therapy: Secondary | ICD-10-CM | POA: Insufficient documentation

## 2023-12-22 DIAGNOSIS — R45850 Homicidal ideations: Secondary | ICD-10-CM | POA: Insufficient documentation

## 2023-12-22 DIAGNOSIS — F250 Schizoaffective disorder, bipolar type: Secondary | ICD-10-CM | POA: Insufficient documentation

## 2023-12-22 DIAGNOSIS — F29 Unspecified psychosis not due to a substance or known physiological condition: Secondary | ICD-10-CM

## 2023-12-22 DIAGNOSIS — J45909 Unspecified asthma, uncomplicated: Secondary | ICD-10-CM | POA: Diagnosis not present

## 2023-12-22 DIAGNOSIS — Z7984 Long term (current) use of oral hypoglycemic drugs: Secondary | ICD-10-CM | POA: Diagnosis not present

## 2023-12-22 LAB — COMPREHENSIVE METABOLIC PANEL WITH GFR
ALT: 20 U/L (ref 0–44)
AST: 27 U/L (ref 15–41)
Albumin: 4.2 g/dL (ref 3.5–5.0)
Alkaline Phosphatase: 60 U/L (ref 38–126)
Anion gap: 13 (ref 5–15)
BUN: 10 mg/dL (ref 6–20)
CO2: 19 mmol/L — ABNORMAL LOW (ref 22–32)
Calcium: 10 mg/dL (ref 8.9–10.3)
Chloride: 104 mmol/L (ref 98–111)
Creatinine, Ser: 1.16 mg/dL — ABNORMAL HIGH (ref 0.44–1.00)
GFR, Estimated: >60 mL/min (ref >60)
Glucose, Bld: 102 mg/dL — ABNORMAL HIGH (ref 70–99)
Potassium: 3.5 mmol/L (ref 3.5–5.1)
Sodium: 136 mmol/L (ref 135–145)
Total Bilirubin: 1.7 mg/dL — ABNORMAL HIGH (ref 0.0–1.2)
Total Protein: 8 g/dL (ref 6.5–8.1)

## 2023-12-22 LAB — ETHANOL: Alcohol, Ethyl (B): <15 mg/dL (ref <15)

## 2023-12-22 LAB — CBC
HCT: 42.3 % (ref 36.0–46.0)
Hemoglobin: 12.8 g/dL (ref 12.0–15.0)
MCH: 21.4 pg — ABNORMAL LOW (ref 26.0–34.0)
MCHC: 30.3 g/dL (ref 30.0–36.0)
MCV: 70.6 fL — ABNORMAL LOW (ref 80.0–100.0)
Platelets: 324 10*3/uL (ref 150–400)
RBC: 5.99 MIL/uL — ABNORMAL HIGH (ref 3.87–5.11)
RDW: 15.5 % (ref 11.5–15.5)
WBC: 13.7 10*3/uL — ABNORMAL HIGH (ref 4.0–10.5)
nRBC: 0 % (ref 0.0–0.2)

## 2023-12-22 LAB — HCG, QUANTITATIVE, PREGNANCY: hCG, Beta Chain, Quant, S: <1 m[IU]/mL (ref <5)

## 2023-12-22 MED ORDER — DIPHENHYDRAMINE HCL 50 MG/ML IJ SOLN: 50.0000 mg | Freq: Three times a day (TID) | INTRAMUSCULAR | Status: DC | PRN

## 2023-12-22 MED ORDER — HALOPERIDOL LACTATE 5 MG/ML IJ SOLN: 10.0000 mg | Freq: Three times a day (TID) | INTRAMUSCULAR | Status: DC | PRN

## 2023-12-22 MED ORDER — ZIPRASIDONE MESYLATE 20 MG IM SOLR
20.0000 mg | Freq: Once | INTRAMUSCULAR | Status: AC
  Administered 2023-12-22: 20 mg via INTRAMUSCULAR
  Filled 2023-12-22: qty 20

## 2023-12-22 MED ORDER — HALOPERIDOL LACTATE 5 MG/ML IJ SOLN: 5.0000 mg | Freq: Three times a day (TID) | INTRAMUSCULAR | Status: DC | PRN

## 2023-12-22 MED ORDER — LORAZEPAM 2 MG/ML IJ SOLN: 2.0000 mg | Freq: Three times a day (TID) | INTRAMUSCULAR | Status: DC | PRN

## 2023-12-22 MED ORDER — DIPHENHYDRAMINE HCL 50 MG PO CAPS: 50.0000 mg | ORAL_CAPSULE | Freq: Three times a day (TID) | ORAL | Status: DC | PRN

## 2023-12-22 MED ORDER — HALOPERIDOL 5 MG PO TABS: 5.0000 mg | ORAL_TABLET | Freq: Three times a day (TID) | ORAL | Status: DC | PRN
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
## 2023-12-22 NOTE — ED Notes (Signed)
 The patient was observed hitting the unit door and yelling. Security initiated an upper arm shoulder hold from 1710 to 1712. Nursing staff successfully deescalated the patient verbally, leading to the patient to stop yelling and hitting the door. The patient then voluntarily entered the assessment room and sat down. The provider was notified of the incident and then assessed the patient. Plan of care ongoing, no further concerns as of present. Patient expresses no other needs at this time.

## 2023-12-22 NOTE — ED Notes (Signed)
 PT came in from [facility] already IVC'd by NP. IVC paperwork packet included 3 copies of first exam (conducted today 12/22/2023), affidavit and petition, findings and custody, all scanned into chart and saved to s-drive.

## 2023-12-22 NOTE — ED Notes (Signed)
 IVC PAPERWORK COMPLETE CASE #[id number], PAPERWORK ATTACHED TO CLIPBOARD IN GREEN ZONE
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
## 2023-12-22 NOTE — Discharge Instructions (Addendum)
 Transfer to [facility]
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
## 2023-12-22 NOTE — BH Assessment (Addendum)
 Comprehensive Clinical Assessment (CCA) Note   12/22/2023 Lindsay [name] [id number]  Disposition: [name], NP recommends inpatient hospitalization.   The patient demonstrates the following risk factors for suicide: Chronic risk factors for suicide include: psychiatric disorder of Schizophrenia . Acute risk factors for suicide include: social withdrawal/isolation. Protective factors for this patient include: positive social support. Considering these factors, the overall suicide risk at this point appears to be low. Patient is not appropriate for outpatient follow up.    Pt brought to [facility] voluntarily by [employer] due to psychosis. Pt was difficult to redirect and refused to participate in the assessment. Pt was observed praying loudly and appears to be hyper-religious. Pt reports that she has not been sleeping due to the enemy playing with her mind.  Pt was poor historian. Unable to assess if pt is SI/ HI/ AVH.  Per mother, pt has not been her self for the couple of weeks. Pt's mother reports that pt has been hyper religious and not sleeping at night. Pt took her 33 yo son to a neighbor's home because she didn't feel right. Pt appears to be responding to internal stimuli. Pt was not cooperative and refused to engage in the assessment.     Chief Complaint: Psychosis   Visit Diagnosis:   Schizophrenia     CCA Screening, Triage and Referral (STR)  Patient Reported Information How did you hear about us ? Legal System  What Is the Reason for Your Visit/Call Today? [facility] due to psychosis. Pt was difficult to redirect and refused to participate in the assessment. Pt was observed praying loudly and appears to be hyper-religious. Pt reports that she has not been sleeping due to the enemy playing with her mind.  Pt was poor historian. Unable to assess if pt is SI/ HI/ AVH.  How Long Has This Been Causing You Problems? 1-6 months  What Do You Feel Would Help  You the Most Today? Treatment for Depression or other mood problem; Stress Management; Medication(s)   Have You Recently Had Any Thoughts About Hurting Yourself? No (UTA)  Are You Planning to Commit Suicide/Harm Yourself At This time? No (UTA)   Flowsheet Row ED from 12/22/2023 in [facility] Most recent reading at 12/22/2023  5:09 PM Admission (Discharged) from 05/13/2023 in [facility] IN[facility]IENT [facility]ULT [facility] Most recent reading at 05/17/2023  7:50 AM ED from 05/13/2023 in [facility] Most recent reading at 05/13/2023  4:41 PM  C-SSRS RISK CATEGORY No Risk No Risk No Risk    Have you Recently Had Thoughts About Hurting Someone [name]? No (UTA)  Are You Planning to Harm Someone at This Time? No (UTA)  Explanation: NA   Have You Used Any Alcohol or Drugs in the Past 24 Hours? No (UTA)  How Long Ago Did You Use Drugs or Alcohol?n/a What Did You Use and How Much? NA   Do You Currently Have a Therapist/Psychiatrist? No  Name of Therapist/Psychiatrist:    Have You Been Recently Discharged From Any Office Practice or Programs? No  Explanation of Discharge From Practice/Program: NA     CCA Screening Triage Referral Assessment Type of Contact: Face-to-Face  Telemedicine Service Delivery:   Is this Initial or Reassessment?   Date Telepsych consult ordered in [facility]:    Time Telepsych consult ordered in [facility]:    Location of Assessment: [facility] Assessment Services  Provider Location: [facility] Assessment Services   Collateral  Involvement: Mother   Does Patient Have a Automotive engineer Guardian? No  Legal Guardian Contact Information: n/a  Copy of Legal Guardianship Form: -- (n/a)  Legal Guardian Notified of Arrival: -- (n/a)  Legal Guardian Notified of Pending Discharge: -- (n/a)  If Minor and Not Living with Parent(s), Who has Custody? n/a  Is CPS involved or ever been involved? Never  Is APS involved  or ever been involved? Never   Patient Determined To Be At Risk for Harm To Self or Others Based on Review of Patient Reported Information or Presenting Complaint? -- (UTA due to psychosis)  Method: -- (UTA due to psychosis)  Availability of Means: -- (UTA due to psychosis)  Intent: -- (UTA due to psychosis)  Notification Required: -- (UTA due to psychosis)  Additional Information for Danger to Others Potential: Active psychosis  Additional Comments for Danger to Others Potential: none  Are There Guns or Other Weapons in Your Home? No  Types of Guns/Weapons: NA  Are These Weapons Safely Secured?                            No  Who Could Verify You Are Able To Have These Secured: NA  Do You Have any Outstanding Charges, Pending Court Dates, Parole/Probation? UTA due to psychosis  Contacted To Inform of Risk of Harm To Self or Others: -- (n/a)    Does Patient Present under Involuntary Commitment? No (IVC pending)    [place] of Residence: [place]   Patient Currently Receiving the Following Services: Not Receiving Services (UTA due to psychosis)   Determination of Need: Emergent (2 hours)   Options For Referral: Inpatient Hospitalization     CCA Biopsychosocial Patient Reported Schizophrenia/Schizoaffective Diagnosis in Past: Yes   Strengths: NA   Mental Health Symptoms Depression:  Change in energy/activity; Tearfulness   Duration of Depressive symptoms: Duration of Depressive Symptoms: Greater than two weeks   Mania:  Change in energy/activity   Anxiety:   Worrying; Tension; Difficulty concentrating   Psychosis:  Hallucinations; Grossly disorganized or catatonic behavior   Duration of Psychotic symptoms: Duration of Psychotic Symptoms: Greater than six months   Trauma:  None   Obsessions:  None   Compulsions:  None   Inattention:  None   Hyperactivity/Impulsivity:  None   Oppositional/Defiant Behaviors:  None   Emotional Irregularity:  Mood  lability   Other Mood/Personality Symptoms:  NA    Mental Status Exam Appearance and self-care  Stature:  Average   Weight:  Obese   Clothing:  Neat/clean; Age-appropriate   Grooming:  Normal   Cosmetic use:  None   Posture/gait:  Normal   Motor activity:  Not Remarkable   Sensorium  Attention:  Normal   Concentration:  Normal   Orientation:  Place; Person; Situation   Recall/memory:  Defective in Short-term; Defective in Recent; Defective in Remote; Defective in Immediate   Affect and Mood  Affect:  Inappropriate; Depressed   Mood:  Depressed; Anxious   Relating  Eye contact:  Normal   Facial expression:  Responsive   Attitude toward examiner:  Cooperative   Thought and Language  Speech flow: Blocked   Thought content:  Appropriate to Mood and Circumstances   Preoccupation:  Religion   Hallucinations:  None   Organization:  Disorganized   Company secretary of Knowledge:  Fair   Intelligence:  Average   Abstraction:  Normal   Judgement:  Impaired  Reality Testing:  Distorted   Insight:  Fair   Decision Making:  Normal   Social Functioning  Social Maturity:  Responsible   Social Judgement:  Normal   Stress  Stressors:  Relationship; Work   Coping Ability:  Human resources officer Deficits:  None   Supports:  Family; Friends/Service system     Religion: Religion/Spirituality Are You A Religious Person?: Yes What is Your Religious Affiliation?: Christian How Might This Affect Treatment?: Pt is hyper religious  Leisure/Recreation: Leisure / Recreation Do You Have Hobbies?: No  Exercise/Diet: Exercise/Diet Do You Exercise?: No Have You Gained or Lost A Significant Amount of Weight in the Past Six Months?: No Do You Follow a Special Diet?: No Do You Have Any Trouble Sleeping?: No   CCA Employment/Education Employment/Work Situation: Employment / Work Situation Employment Situation: Employed Work Stressors: none  reported Patient's Job has Been Impacted by Current Illness: No Has Patient ever Been in Equities trader?: No (UTA)  Education: Education Is Patient Currently Attending School?: No Last Grade Completed: 12 Did You Product manager?: No (UTA) Did You Have An Individualized Education Program (IIEP): No Did You Have Any Difficulty At School?: No Patient's Education Has Been Impacted by Current Illness: No   CCA Family/Childhood History Family and Relationship History: Family history Marital status: Single Does patient have children?: Yes How many children?: 1 How is patient's relationship with their children?: no concerns reported  Childhood History:  Childhood History By whom was/is the patient raised?: Both parents Did patient suffer any verbal/emotional/physical/sexual abuse as a child?: No Did patient suffer from severe childhood neglect?: No Has patient ever been sexually abused/assaulted/raped as an adolescent or adult?: No Was the patient ever a victim of a crime or a disaster?: No Witnessed domestic violence?: No Has patient been affected by domestic violence as an adult?: No       CCA Substance Use Alcohol/Drug Use: Alcohol / Drug Use Pain Medications: See MAR Prescriptions: See MAR Over the Counter: See MAR History of alcohol / drug use?: No history of alcohol / drug abuse (Pt denies.) Longest period of sobriety (when/how long): n/a Negative Consequences of Use:  (n/a) Withdrawal Symptoms:  (n/a)                         ASAM's:  Six Dimensions of Multidimensional Assessment  Dimension 1:  Acute Intoxication and/or Withdrawal Potential:      Dimension 2:  Biomedical Conditions and Complications:      Dimension 3:  Emotional, Behavioral, or Cognitive Conditions and Complications:     Dimension 4:  Readiness to Change:     Dimension 5:  Relapse, Continued use, or Continued Problem Potential:     Dimension 6:  Recovery/Living Environment:     ASAM  Severity Score:    ASAM Recommended Level of Treatment: ASAM Recommended Level of Treatment:  (n/a)   Substance use Disorder (SUD) Substance Use Disorder (SUD)  Checklist Symptoms of Substance Use:  (n/a)  Recommendations for Services/Supports/Treatments: Recommendations for Services/Supports/Treatments Recommendations For Services/Supports/Treatments: Inpatient Hospitalization  Disposition Recommendation per psychiatric provider: We recommend inpatient psychiatric hospitalization after medical hospitalization. Patient has been involuntarily committed on 12/22/23.    DSM5 Diagnoses: Patient Active Problem List   Diagnosis Date Noted   Schizoaffective disorder, bipolar type (HCC) 05/14/2023   Schizophrenia, unspecified type (HCC) 05/13/2023   Hyperprolactinemia (HCC) 02/01/2023   Prediabetes 02/01/2023   Mixed hyperlipidemia 02/01/2023   Morbid obesity (HCC) 02/01/2023  Acanthosis nigricans 02/01/2023   Schizophrenia spectrum disorder with psychotic disorder type not yet determined (HCC) 09/16/2020   Normal postpartum course 01/29/2019   S/P primary low transverse C-section 01/29/2019   Gestational hypertension 01/24/2019   Gestational hypertension, third trimester 01/24/2019   Transient hypertension 01/12/2019     Referrals to Alternative Service(s): Referred to Alternative Service(s):   Place:   Date:   Time:    Referred to Alternative Service(s):   Place:   Date:   Time:    Referred to Alternative Service(s):   Place:   Date:   Time:    Referred to Alternative Service(s):   Place:   Date:   Time:     Rosina PARAS, [place], [facility]

## 2023-12-22 NOTE — ED Provider Notes (Signed)
 Behavioral Health Urgent Care Medical Screening Exam  Patient Name: Lindsay [name] MRN: [id number] Date of Evaluation: 12/22/23 Chief Complaint:  Acute psychosis  Diagnosis:  Final diagnoses:  Schizoaffective disorder, bipolar type (HCC)    History of Present illness: Lindsay [name], 33 y.o., female with a past history significant for schizoaffective disorder, bipolar type presented to [facility] voluntarily via BHRT for mental health evaluation.Assessment grossly complicated by patient's current presentation. Upon assessment, patient appears disorganized and appears to be actively responding to internal stimuli as evidenced by talking to self in a loud volume of speech. Patient is unable to engage in assessment or conversation due to presentation. Patient is hyperverbal with pressured speech, bizarre behavior, labile affect, verbalizing religious statements and unable to be redirected. UTA suicidal ideation, homicidal ideation, visual hallucinations, and auditory hallucinations.   Collateral received from patient's mother, [name]: She reports patient has a psychiatric history significant for bipolar disorder with psychosis (per chart review, patient has a history of schizoaffective disorder, bipolar type). [name] reports that she received a Facebook message from patient's neighbor stating that [name] was off. Neighbor reports that patient left her 27 year old son at neighbor's house. Mother reports that patient has not slept in the last 3 days. Mother reports that patient has been experiencing escalating behaviors and experiencing mood lability. Upon assessment, patient stated the enemy has been messing with my mind. Patient also reports she has not slept for a while.  Patient was redirected to assessment room with staff assistance. Patient presented with a labile affect. Per security staff, patient ran out of assessment room and attempted to enter locked unit. Per security, attempts at  verbal de-escalation were ineffective, patient became physically aggressive (shoved security against the wall) and required a manual hold from [id number]. Per security, patient calmed down shortly after. Per security, patient required another manual hold from [phone] due to an attempt to enter into a location where another patient was present. Face to face assessment completed for both incidents and Dr. Zouev notified.   Flowsheet Row ED from 12/22/2023 in [facility] Most recent reading at 12/22/2023  5:09 PM [facility] Most recent reading at 05/17/2023  7:50 AM ED from 05/13/2023 in [facility] Most recent reading at 05/13/2023  4:41 PM  C-SSRS RISK CATEGORY No Risk No Risk No Risk    Psychiatric Specialty Exam  Presentation  General Appearance:Bizarre  Eye Contact:Poor; Minimal  Speech:Pressured  Speech Volume:Increased    Mood and Affect  Mood: Labile  Affect: Labile   Thought Process  Thought Processes: Disorganized  Descriptions of Associations:Tangential  Orientation:Other (comment) (UTA)  Thought Content:Illogical; Perseveration; Tangential  Diagnosis of Schizophrenia or Schizoaffective disorder in past: Yes  Duration of Psychotic Symptoms: Greater than six months  Hallucinations:Other (comment) (UTA)  Ideas of Reference:Other (comment) (UTA)  Suicidal Thoughts:-- (UTA)  Homicidal Thoughts:-- (UTA)   Sensorium  Memory: Other (comment) (UTA)  Judgment: Impaired  Insight: None   Executive Functions  Concentration: Poor  Attention Span: Poor  Recall: Poor (UTA)  Fund of Knowledge: Poor  Language: Poor   Psychomotor Activity  Psychomotor Activity: Increased; Restlessness   Assets  Assets: Housing; Social Support   Sleep  Sleep: Poor  Number of hours:  0   Physical Exam: Physical  Exam Neurological:     Mental Status: She is alert. She is disoriented.  Psychiatric:  Mood and Affect: Affect is labile.        Speech: Speech is rapid and pressured and tangential.        Behavior: Behavior is agitated.        Judgment: Judgment is impulsive and inappropriate.    Review of Systems  Unable to perform ROS: Psychiatric disorder   unknown if currently breastfeeding. There is no height or weight on file to calculate BMI.  Musculoskeletal: Strength & Muscle Tone: within normal limits Gait & Station: normal Patient leans: N/A   [facility] MSE Discharge Disposition for Follow up and Recommendations: Based on my evaluation I certify that psychiatric inpatient services furnished can reasonably be expected to improve the patient's condition which I recommend transfer to an appropriate accepting facility.   Patient is under involuntary commitment and inpatient hospitalization will be pursued.  Given the patient's current presentation and inability to engage in urgent care-level interventions, she requires a higher level of care for further psychiatric stabilization. The patient has refused labs and medication despite multiple attempts to explain the importance of these interventions. The patient's current presentation exceeds the resources and scope of our urgent care facility.   Report called [name]. Accepting provider: [name].  Please add TTS consult while in the ED for psychiatry to follow. Patient is recommended for inpatient psychiatric treatment for acute psychosis. Psychiatry CSW to seek appropriate placement while in the ED.   [name], NP 12/22/2023, 6:32 PM

## 2023-12-22 NOTE — ED Notes (Signed)
 Patient refusing to have vitals taken. Plan of care ongoing, no further concerns as of present. Patient expresses no other needs at this time.

## 2023-12-22 NOTE — ED Notes (Signed)
 Sitter at the bedside.

## 2023-12-22 NOTE — ED Notes (Signed)
 Patient continues to refuse vitals. Provider notified. Plan of care ongoing, no further concerns as of present. Patient expresses no other needs at this time.

## 2023-12-22 NOTE — ED Notes (Signed)
 Patient in back sally port refusing to be roomed. Facing door to exit the building, speaking in tongues, hyper focused on god and prayer. Plan of care ongoing, no further concerns as of present. Patient expresses no other needs at this time.

## 2023-12-23 ENCOUNTER — Encounter (HOSPITAL_COMMUNITY): Payer: Self-pay | Admitting: Psychiatry

## 2023-12-23 DIAGNOSIS — F250 Schizoaffective disorder, bipolar type: Secondary | ICD-10-CM

## 2023-12-23 LAB — RESP PANEL BY RT-PCR (RSV, FLU A&B, COVID)  RVPGX2
Influenza A by PCR: NEGATIVE
Influenza B by PCR: NEGATIVE
Resp Syncytial Virus by PCR: NEGATIVE
SARS Coronavirus 2 by RT PCR: NEGATIVE

## 2023-12-23 LAB — RAPID URINE DRUG SCREEN, HOSP PERFORMED
Amphetamines: NOT DETECTED
Barbiturates: NOT DETECTED
Benzodiazepines: POSITIVE — AB
Cocaine: NOT DETECTED
Opiates: NOT DETECTED
Tetrahydrocannabinol: NOT DETECTED

## 2023-12-23 MED ORDER — DROPERIDOL 2.5 MG/ML IJ SOLN
5.0000 mg | Freq: Once | INTRAMUSCULAR | Status: AC
  Administered 2023-12-23: 5 mg via INTRAMUSCULAR
  Filled 2023-12-23: qty 2

## 2023-12-23 MED ORDER — MELATONIN 3 MG PO TABS
3.0000 mg | ORAL_TABLET | Freq: Every day | ORAL | Status: DC
  Administered 2023-12-23 – 2023-12-24 (×2): 3 mg via ORAL
  Filled 2023-12-23 (×2): qty 1

## 2023-12-23 MED ORDER — ARIPIPRAZOLE 10 MG PO TABS
10.0000 mg | ORAL_TABLET | Freq: Every day | ORAL | Status: DC
  Administered 2023-12-24 – 2023-12-25 (×2): 10 mg via ORAL
  Filled 2023-12-23 (×2): qty 1

## 2023-12-23 MED ORDER — ARIPIPRAZOLE 5 MG PO TABS
5.0000 mg | ORAL_TABLET | Freq: Every day | ORAL | Status: DC
  Administered 2023-12-23: 5 mg via ORAL
  Filled 2023-12-23: qty 1

## 2023-12-23 MED ORDER — MIDAZOLAM HCL 2 MG/2ML IJ SOLN
2.0000 mg | Freq: Once | INTRAMUSCULAR | Status: AC
  Administered 2023-12-23: 2 mg via INTRAMUSCULAR
  Filled 2023-12-23: qty 2

## 2023-12-23 MED ORDER — ARIPIPRAZOLE 5 MG PO TABS
5.0000 mg | ORAL_TABLET | Freq: Once | ORAL | Status: AC
  Administered 2023-12-23: 5 mg via ORAL
  Filled 2023-12-23: qty 1

## 2023-12-23 NOTE — ED Notes (Signed)
 Patient moved to hospital bed for comfort; Security in unit for support; Pt was calm and was able to walk w/o assistance to bed; Pt understood why she had to be restrained and was aware of events that brought her in. Pt states she remember taking her 33 yo to her neighbor because she trusted her; Pt states she has been w/o rest for days; Pt given water  and is now resting with eyes closed-Monique,RN

## 2023-12-23 NOTE — ED Notes (Signed)
 Pt In room praying out loud and remains hyper religious at this time. Sitter in line of site.

## 2023-12-23 NOTE — ED Notes (Signed)
 IVC'd 12/22/23, exp 12/29/23

## 2023-12-23 NOTE — ED Notes (Addendum)
 Patient still at this time very hyper religious and shouting out prayers. Pt with eyes closed states I cast out and rebuke the demonic spirits that are trying to take over my body, in the name of jesus . Sitter in line of site.

## 2023-12-23 NOTE — ED Provider Notes (Signed)
 Patient out of restraints this morning, still intermittently restless and praying.  Abilify  5 mg ordered for daily meds.  She is under IVC   Cottie Donnice PARAS, MD 12/23/23 [phone]
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
## 2023-12-23 NOTE — ED Notes (Signed)
 [place] Fernandez, RN the intake coordinator from [facility] said they are interesting in taking the patient. Phone number is [phone].

## 2023-12-23 NOTE — ED Notes (Signed)
 Spoke with pt's mother Vertie and informed her of pt's acceptance to [facility]. Provided pt's expected room # and updated her mother on pt's condition.

## 2023-12-23 NOTE — ED Notes (Signed)
 IVC paperwork moved to purple zone

## 2023-12-23 NOTE — Progress Notes (Signed)
 Pt has been accepted to [facility] . Bed assignment: Unit 3W Rm 914   Pt meets inpatient criteria per Rolin Lipps NP   Attending Physician will be Lynwood Fire, MD   Report can be called to: [phone]  Pt can arrive ASAP  Care Team Notified: Jerel Gravely, NP, Flavia Blackbird, RN

## 2023-12-23 NOTE — Progress Notes (Signed)
 LCSW Progress Note  [id number]   Lindsay [name]  12/23/2023  9:18 AM  Description:   Inpatient Psychiatric Referral  Patient was recommended inpatient per Jasmine Levy NP. There are no available beds at [facility], per [facility] Bretta Qua RN. Patient was referred to the following out of network facilities:   Destination  Service Provider Address Phone Fax  [facility]  [address], [place] [address] [phone]  [facility]  [address] [place], [place] [address] [phone]  [facility]  [address] [place] [address] [phone]  [facility]  [address] [place] [address] [phone]  [facility]  [address] [place] [address] [phone]  [facility]  [address] [place] [address] [phone]  [facility] EFAX  [address] [place], [place] [phone]  [facility]  [address] [place] [address] [phone]      Situation ongoing, CSW to continue following and update chart as more information becomes available.     [place] Mahika Vanvoorhis, MSW, LCSW  12/23/2023 9:18 AM

## 2023-12-24 MED ORDER — OLANZAPINE 10 MG IM SOLR
10.0000 mg | Freq: Once | INTRAMUSCULAR | Status: AC | PRN
Start: 2023-12-24 — End: 2023-12-25
  Administered 2023-12-25: 10 mg via INTRAMUSCULAR
  Filled 2023-12-24: qty 10

## 2023-12-24 MED ORDER — OLANZAPINE 10 MG IM SOLR: 5.0000 mg | Freq: Once | INTRAMUSCULAR | Status: DC | PRN

## 2023-12-24 MED ORDER — OLANZAPINE 5 MG PO TABS: 5.0000 mg | ORAL_TABLET | Freq: Once | ORAL | Status: DC | PRN

## 2023-12-24 NOTE — ED Notes (Signed)
 Patient took home patient dirty clothes that was in locker locker 6

## 2023-12-24 NOTE — ED Notes (Signed)
 RN called patient mother to let her know the patient will not be transported to [place] today it will be tomorrow.
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
## 2023-12-24 NOTE — ED Notes (Signed)
 Report call to Michaelle Chester, RN at [phone] from [facility]

## 2023-12-24 NOTE — ED Notes (Signed)
Patient calm and cooperative at this time.  °

## 2023-12-24 NOTE — ED Notes (Addendum)
 Rn called Michaelle Chester, RN at 11:04 am from [facility], that the patient will not be coming today, because of transportation. She will be transported tomorrow. Per Michaelle Chester, RN they will hold the bed

## 2023-12-24 NOTE — ED Provider Notes (Signed)
 Emergency Medicine Observation Re-evaluation Note  Lindsay [name] is a 33 y.o. female, seen on rounds today.  Pt initially presented to the ED for complaints of Hallucinations Currently, the patient is asleep.  Pt presented on 7/3 for psychosis.  She was seen by psych who recommends inpatient treatment.  Pt has had intermittent agitation while here.  She was accepted to [facility] for today.  Physical Exam  BP (!) 150/76 (BP Location: Left Wrist)   Pulse 100   Temp 98.2 F (36.8 C) (Oral)   Resp 18   Ht 5' 3 (1.6 m)   Wt (!) 155.1 kg   LMP 11/28/2023 (Exact Date)   SpO2 98%   Breastfeeding No   BMI 60.58 kg/m  Physical Exam General: asleep Cardiac: rr Lungs: clear Psych: asleep  ED Course / MDM  EKG:   I have reviewed the labs performed to date as well as medications administered while in observation.  Recent changes in the last 24 hours include acceptance to inpatient.  Plan  Current plan is for tx to [facility] today.    Dean Clarity, MD 12/24/23 0730

## 2023-12-24 NOTE — ED Notes (Signed)
 Patient brought clean clothes for the patient inventory sheet was done and belonging was placed in locker 6

## 2023-12-24 NOTE — ED Notes (Signed)
 The patient's mother, Raymund Cassis, is requesting and update on her daughter's status before she is transferred. She would like to speak with her as well as bring her some clothes before she is transported to another facility.

## 2023-12-24 NOTE — ED Notes (Signed)
 IVC is current

## 2023-12-24 NOTE — ED Notes (Signed)
 The RN on duty has been provided with the mother's name and telephone number.

## 2023-12-24 NOTE — ED Notes (Signed)
 I have called [employer] for pt pick

## 2023-12-24 NOTE — ED Notes (Signed)
 Will not be able to pick pt until sun
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
## 2023-12-25 NOTE — ED Provider Notes (Signed)
 Emergency Medicine Observation Re-evaluation Note  Lindsay [name] is a 33 y.o. female, seen on rounds today.  Pt initially presented to the ED for complaints of Hallucinations Currently, the patient is calm and cooperative.  Physical Exam  BP 133/88   Pulse 92   Temp 98 F (36.7 C) (Oral)   Resp 18   Ht 5' 3 (1.6 m)   Wt (!) 155.1 kg   LMP 11/28/2023 (Exact Date)   SpO2 98%   Breastfeeding No   BMI 60.58 kg/m  Physical Exam General: Awake. Alert. No acute distress Cardiac: Regular rate rhythm Lungs: Clear to auscultation bilaterally Psych: Calm and cooperative  ED Course / MDM  EKG:   I have reviewed the labs performed to date as well as medications administered while in observation.  Recent changes in the last 24 hours include no acute events.  Plan  Current plan is for psychiatric admission to.  [facility].  Dr. Henry accepting physician.  Patient stabilized.  Will arrange for transport with [facility] department    Pamella Ozell DELENA, DO 12/25/23 [phone]

## 2023-12-25 NOTE — ED Notes (Signed)
 Patient feeling agitated want something to help her sleep, will give PRN med as indicated

## 2023-12-25 NOTE — ED Notes (Signed)
 No sitter at this time, staffing aware, will reach out to ED charge nurse

## 2024-02-28 ENCOUNTER — Other Ambulatory Visit: Payer: Self-pay

## 2024-02-28 ENCOUNTER — Emergency Department (HOSPITAL_COMMUNITY)
Admission: EM | Admit: 2024-02-28 | Discharge: 2024-02-28 | Disposition: A | Discharge status: Home / Self Care | Payer: MEDICAID | Attending: Emergency Medicine | Admitting: Emergency Medicine

## 2024-02-28 ENCOUNTER — Encounter (HOSPITAL_COMMUNITY): Payer: Self-pay

## 2024-02-28 DIAGNOSIS — G4700 Insomnia, unspecified: Secondary | ICD-10-CM | POA: Insufficient documentation

## 2024-02-28 DIAGNOSIS — J45909 Unspecified asthma, uncomplicated: Secondary | ICD-10-CM | POA: Insufficient documentation

## 2024-02-28 DIAGNOSIS — I10 Essential (primary) hypertension: Secondary | ICD-10-CM | POA: Diagnosis not present

## 2024-02-28 NOTE — ED Triage Notes (Signed)
 Pt arrived via POV c/o feeling as if she is about turn manic. Pt reports missing a dose of her medications and feels her levels may not be effective. Pt reports she is not SI/HI, denies AVH and reports she has been having a hard time sleeping. Pt reports her medications were changed in June and this may be one of the problems.

## 2024-02-28 NOTE — ED Provider Notes (Signed)
 [place] EMERGENCY DEPARTMENT AT [facility] Provider Note   CSN: [id number] Arrival date & time: 02/28/24  1621     Patient presents with: Medical Clearance   Lindsay [name] is a 33 y.o. female.  HPI Patient is a 33 year old female present to ED today for concerns for developing mania, note that she also struggles with insomnia.  Notes that she has been insomnia having for the last few days but was able to get good sleep last night after taking her prescribed hydroxyzine .  Notes that she does have poor sleep hygiene with looking at her phone and scrolling before bed.  Says that she was beginning to feel manic but immediately felt better after having a chocolate bar in the emergency room.  Taking lithium  as prescribed.  Denies audio or visual hallucinations, SI, HI.  Denies drug use.  Denies alcohol use.  Denies fever, headache, chest pain, shortness of breath, abdominal pain, nausea, vomiting, diarrhea, dysuria, lower leg swelling.    Prior to Admission medications   Medication Sig Start Date End Date Taking? Authorizing Provider  ARIPiprazole  (ABILIFY ) 2 MG tablet Take 2 mg by mouth daily as needed. 11/29/23   [provider]  ARIPiprazole  (ABILIFY ) 5 MG tablet Take 5 mg by mouth daily. 11/30/23   [provider]  hydrOXYzine  (ATARAX ) 50 MG tablet Take 50 mg by mouth at bedtime as needed (for sleep). Patient not taking: Reported on 12/22/2023 12/15/23   [provider]  metFORMIN  (GLUCOPHAGE ) 500 MG tablet Take 500 mg by mouth daily. Patient not taking: Reported on 12/22/2023    [provider]  Vitamin D, Ergocalciferol, (DRISDOL) 1.25 MG (50000 UNIT) CAPS capsule Take 50,000 Units by mouth once a week. 12/21/23   [provider]    Allergies: Naproxen , Hydroxyzine  hcl, Nabumetone, and Penicillins    Review of Systems  Psychiatric/Behavioral:  Positive for sleep disturbance. The patient is nervous/anxious.   All other systems  reviewed and are negative.   Updated Vital Signs BP 118/60 (BP Location: Right Arm)   Pulse (!) 107   Temp 98.5 F (36.9 C) (Oral)   Resp 19   Ht 5' 3 (1.6 m)   Wt (!) 155.1 kg   LMP 12/07/2023 (Approximate)   SpO2 97%   BMI 60.57 kg/m   Physical Exam Vitals and nursing note reviewed.  Constitutional:      General: She is not in acute distress.    Appearance: Normal appearance. She is not ill-appearing or diaphoretic.  HENT:     Head: Normocephalic and atraumatic.  Eyes:     General: No scleral icterus.       Right eye: No discharge.        Left eye: No discharge.     Extraocular Movements: Extraocular movements intact.     Conjunctiva/sclera: Conjunctivae normal.  Cardiovascular:     Rate and Rhythm: Normal rate and regular rhythm.     Pulses: Normal pulses.     Heart sounds: Normal heart sounds. No murmur heard.    No friction rub. No gallop.  Pulmonary:     Effort: Pulmonary effort is normal. No respiratory distress.     Breath sounds: No stridor. No wheezing, rhonchi or rales.  Chest:     Chest wall: No tenderness.  Abdominal:     General: Abdomen is flat. There is no distension.     Palpations: Abdomen is soft.     Tenderness: There is no abdominal tenderness. There is no  right CVA tenderness, left CVA tenderness, guarding or rebound.  Musculoskeletal:        General: No swelling, deformity or signs of injury.     Cervical back: Normal range of motion. No rigidity.     Right lower leg: No edema.     Left lower leg: No edema.  Skin:    General: Skin is warm and dry.     Findings: No bruising, erythema or lesion.  Neurological:     General: No focal deficit present.     Mental Status: She is alert and oriented to person, place, and time. Mental status is at baseline.     Sensory: No sensory deficit.     Motor: No weakness.  Psychiatric:        Mood and Affect: Mood normal.        Behavior: Behavior normal.        Thought Content: Thought content normal.         Judgment: Judgment normal.     (all labs ordered are listed, but only abnormal results are displayed) Labs Reviewed - No data to display  EKG: None  Radiology: No results found.  Procedures   Medications Ordered in the ED - No data to display                              Medical Decision Making  This patient is a 33 year old female who presents to the ED for concern of insomnia, with concerns for possible developing mania.  Noted to have a history of schizophrenia, recently had inpatient treatment 2 months ago.  Currently is not experiencing any symptoms as upon evaluation, said that her developing mania had completely abated after she had eaten a chocolate bar.  Says that she has no other concerns at this point outside of having some bouts of insomnia however did note that she had a full night sleep last night after taking hydroxyzine  that she is already prescribed.  Note that she has not followed up with psychiatry at this time but plans on doing so.  On physical exam, patient is in no acute distress, afebrile, alert and orient x 4, speaking in full sentences, nontachypneic, nontachycardic.  Was noted to have mild tachycardia in triage however when evaluating patient was not tachycardic, with heart rate of high 80s low 90s.  LCTAB, no abdominal tenderness.  Unremarkable exam otherwise.  Patient thought content and mood were normal as she was behaving normally, denying hallucinations and says that she did feel significantly better after she had her food.  Will have her continue to perform good sleep hygiene, follow-up psychiatry, take hydroxyzine  as prescribed, and return to the ED if she was having new or worsening symptoms.  Patient vital signs have remained stable throughout the course of patient's time in the ED. Low suspicion for any other emergent pathology at this time. I believe this patient is safe to be discharged. Provided strict return to ER precautions. Patient  expressed agreement and understanding of plan. All questions were answered.  Differential diagnoses prior to evaluation: The emergent differential diagnosis includes, but is not limited to, thyroid  disorder, drug abuse, secondary drug reaction, CVA, sleep duration, bipolar, electrolyte disturbance, encephalitis. This is not an exhaustive differential.   Past Medical History / Co-morbidities / Social History: HTN, schizophrenia, prediabetes, HLD, asthma, anemia.  Additional history: Chart reviewed. Pertinent results include:   Last seen in the ED on 12/22/2023 for  psychosis noting hallucinations at the time.  Admitted to [facility].  Last seen by PCP on 01/25/2024, currently taking lithium  4 mg noted to have 6 hospitalizations last 2 years for psychosis.  Referred to psychiatry.    Medications:  I have reviewed the patients home medicines and have made adjustments as needed.  Critical Interventions: None  Social Determinants of Health: Recent inpatient treatment for schizophrenia with hallucinations  Disposition: After consideration of the diagnostic results and the patients response to treatment, I feel that the patient would benefit from discharge and treatment as above emergency department workup does not suggest an emergent condition requiring admission or immediate intervention beyond what has been performed at this time. The plan is: Follow-up with psychiatry, continue taking hydroxyzine  and focusing on good sleep hygiene, return for new or worsening symptoms. The patient is safe for discharge and has been instructed to return immediately for worsening symptoms, change in symptoms or any other concerns.   Final diagnoses:  Insomnia, unspecified type    ED Discharge Orders     None          Beola Terrall GORMAN DEVONNA 02/28/24 1725    Cleotilde Rogue, MD 03/02/24 [phone]

## 2024-02-28 NOTE — Discharge Instructions (Signed)
 You were seen today for insomnia.  Recommend that you continue to follow-up with psychiatry for further help with managing your medications as well as for continued evaluation.  With your improved symptoms after eating, recommend you continue to have regular meals as well as staying well-hydrated.  Continue taking your hydroxyzine  as prescribed.  Recommending good sleep routine and sleep hygiene.  This would include following the 3, 2, 1 rule of not eating anything 3 hours for bed, not drinking 2 hours for bed, no phones or screens 1 hour before bed.  Some people find it helpful to take a hot shower before bed, as well as reading a book and prayer.  Return to ED if you have any new or worsening symptoms.

## 2024-02-29 ENCOUNTER — Ambulatory Visit (HOSPITAL_COMMUNITY): Admission: EM | Admit: 2024-02-29 | Discharge: 2024-02-29 | Discharge status: Against Medical Advice | Payer: MEDICAID

## 2024-02-29 ENCOUNTER — Other Ambulatory Visit: Payer: Self-pay

## 2024-02-29 ENCOUNTER — Emergency Department (HOSPITAL_COMMUNITY)
Admission: EM | Admit: 2024-02-29 | Discharge: 2024-02-29 | Disposition: A | Discharge status: Home / Self Care | Payer: MEDICAID

## 2024-02-29 ENCOUNTER — Encounter (HOSPITAL_COMMUNITY): Payer: Self-pay

## 2024-02-29 DIAGNOSIS — G4700 Insomnia, unspecified: Secondary | ICD-10-CM | POA: Insufficient documentation

## 2024-02-29 DIAGNOSIS — Z8659 Personal history of other mental and behavioral disorders: Secondary | ICD-10-CM | POA: Diagnosis not present

## 2024-02-29 DIAGNOSIS — Z139 Encounter for screening, unspecified: Secondary | ICD-10-CM | POA: Diagnosis not present

## 2024-02-29 LAB — CBC WITH DIFFERENTIAL/PLATELET
Abs Immature Granulocytes: 0.03 K/uL (ref 0.00–0.07)
Basophils Absolute: 0 K/uL (ref 0.0–0.1)
Basophils Relative: 0 %
Eosinophils Absolute: 0.1 K/uL (ref 0.0–0.5)
Eosinophils Relative: 1 %
HCT: 41.4 % (ref 36.0–46.0)
Hemoglobin: 12.1 g/dL (ref 12.0–15.0)
Immature Granulocytes: 0 %
Lymphocytes Relative: 14 %
Lymphs Abs: 1.5 K/uL (ref 0.7–4.0)
MCH: 21.3 pg — ABNORMAL LOW (ref 26.0–34.0)
MCHC: 29.2 g/dL — ABNORMAL LOW (ref 30.0–36.0)
MCV: 72.9 fL — ABNORMAL LOW (ref 80.0–100.0)
Monocytes Absolute: 0.7 K/uL (ref 0.1–1.0)
Monocytes Relative: 6 %
Neutro Abs: 7.9 K/uL — ABNORMAL HIGH (ref 1.7–7.7)
Neutrophils Relative %: 79 %
Platelets: 307 K/uL (ref 150–400)
RBC: 5.68 MIL/uL — ABNORMAL HIGH (ref 3.87–5.11)
RDW: 16.2 % — ABNORMAL HIGH (ref 11.5–15.5)
WBC: 10.2 K/uL (ref 4.0–10.5)
nRBC: 0 % (ref 0.0–0.2)

## 2024-02-29 LAB — RAPID URINE DRUG SCREEN, HOSP PERFORMED
Amphetamines: NOT DETECTED
Barbiturates: NOT DETECTED
Benzodiazepines: NOT DETECTED
Cocaine: NOT DETECTED
Opiates: NOT DETECTED
Tetrahydrocannabinol: NOT DETECTED

## 2024-02-29 LAB — COMPREHENSIVE METABOLIC PANEL WITH GFR
ALT: 14 U/L (ref 0–44)
AST: 15 U/L (ref 15–41)
Albumin: 4.1 g/dL (ref 3.5–5.0)
Alkaline Phosphatase: 60 U/L (ref 38–126)
Anion gap: 13 (ref 5–15)
BUN: 7 mg/dL (ref 6–20)
CO2: 22 mmol/L (ref 22–32)
Calcium: 9.8 mg/dL (ref 8.9–10.3)
Chloride: 103 mmol/L (ref 98–111)
Creatinine, Ser: 1.02 mg/dL — ABNORMAL HIGH (ref 0.44–1.00)
GFR, Estimated: >60 mL/min (ref >60)
Glucose, Bld: 101 mg/dL — ABNORMAL HIGH (ref 70–99)
Potassium: 3.5 mmol/L (ref 3.5–5.1)
Sodium: 138 mmol/L (ref 135–145)
Total Bilirubin: 1.5 mg/dL — ABNORMAL HIGH (ref 0.0–1.2)
Total Protein: 7.4 g/dL (ref 6.5–8.1)

## 2024-02-29 LAB — ETHANOL: Alcohol, Ethyl (B): <15 mg/dL (ref <15)

## 2024-02-29 MED ORDER — MELATONIN 3 MG PO TABS
3.0000 mg | ORAL_TABLET | Freq: Every day | ORAL | Status: DC
  Filled 2024-02-29: qty 1

## 2024-02-29 MED ORDER — MELATONIN 3 MG PO CAPS: 3.0000 mg | ORAL_CAPSULE | Freq: Every evening | ORAL | 0 refills | Status: AC

## 2024-02-29 NOTE — ED Notes (Signed)
 Patient left AMA.

## 2024-02-29 NOTE — ED Notes (Signed)
 Pt is lying in the floor in the lobby obstructing traffic flow of pt's. This RN and security went to pt to check on her, states she just needs to lay down. Pt advised she cannot lie on the lobby floor and she refused to get up. Pt is agreeable to go get in wheelchair to return to triage, but no rooms available there, so pt then roomed to hallway stretcher. Triage PA put in a TTS consult in the meantime.

## 2024-02-29 NOTE — Discharge Instructions (Addendum)
 You can take your melatonin nightly as prescribed.  Drink lots of fluids and eat a balanced diet.  Follow-up with your primary care doctor as needed.

## 2024-02-29 NOTE — ED Triage Notes (Signed)
 Pt states she is dehydrated and has insomnia, and seeing people that aren't there for a couple of days. C/O n/v/diaphoretic. Denies SHOB and CP

## 2024-02-29 NOTE — ED Provider Triage Note (Cosign Needed Addendum)
 Emergency Medicine Provider Triage Evaluation Note  [name] , a 33 y.o. female  was evaluated in triage.  Pt complains of dehydration and insomnia. Patient reports symptoms have been ongoing for several days, when asked why she feels dehydrated she states I have not had anything to drink in 2 days. When asked why, she states I believe God will provide my hydration. History of insomnia, has used hydroxyzine  in the past but reports this is not working for her currently. She endorses history of mania/paranoia, on lithium . Denies SI/HI, reports seeing people I know in other people when asked about visual hallucinations, denies auditory hallucinations.   Review of Systems  Positive: As above Negative: As above  Physical Exam  BP (!) 150/92 (BP Location: Right Wrist)   Pulse (!) 111   Temp 98.3 F (36.8 C)   Resp (!) 22   Ht 5' 3 (1.6 m)   Wt (!) 156.5 kg   LMP 12/07/2023 (Approximate)   SpO2 97%   BMI 61.11 kg/m  Gen:   Awake, no distress   Resp:  Normal effort  MSK:   Moves extremities without difficulty  Other:    Medical Decision Making  Medically screening exam initiated at 4:46 PM.  Appropriate orders placed.  [name] was informed that the remainder of the evaluation will be completed by another provider, this initial triage assessment does not replace that evaluation, and the importance of remaining in the ED until their evaluation is complete.  Will obtain basic labs, patient would likely benefit from TTS consult given her history and endorsement of visual hallucinations.   Around 8 PM, patient was brought back to triage as she is yelling hyperreligious statements in the lobby and reading from the Bible loudly, pacing around, she told nursing staff that she is having intrusive thoughts, she was brought back to the triage area and was laying on the floor refusing to move, screening labs have been completed and patient is being moved to a bed pending TTS consult        Glendia Rocky SAILOR, PA-C 02/29/24 1957

## 2024-02-29 NOTE — ED Provider Notes (Signed)
 [place] EMERGENCY DEPARTMENT AT [facility] Provider Note   CSN: [id number] Arrival date & time: 02/29/24  1508     Patient presents with: Dehydration   Lindsay [name] is a 33 y.o. female.   33 year old female presents for evaluation of multiple complaints.  States she has had trouble sleeping last few days.  States she also feels dehydrated.  She states she is able to eat and drink without difficulty and has not had any vomiting or diarrhea.  Denies any other symptoms or concerns at this time.        Prior to Admission medications   Medication Sig Start Date End Date Taking? Authorizing Provider  Melatonin 3 MG CAPS Take 1 capsule (3 mg total) by mouth at bedtime for 14 days. 02/29/24 03/14/24 Yes Sonya Pucci L, DO  ARIPiprazole  (ABILIFY ) 2 MG tablet Take 2 mg by mouth daily as needed. 11/29/23   [provider]  ARIPiprazole  (ABILIFY ) 5 MG tablet Take 5 mg by mouth daily. 11/30/23   [provider]  hydrOXYzine  (ATARAX ) 50 MG tablet Take 50 mg by mouth at bedtime as needed (for sleep). Patient not taking: Reported on 12/22/2023 12/15/23   [provider]  metFORMIN  (GLUCOPHAGE ) 500 MG tablet Take 500 mg by mouth daily. Patient not taking: Reported on 12/22/2023    [provider]  Vitamin D, Ergocalciferol, (DRISDOL) 1.25 MG (50000 UNIT) CAPS capsule Take 50,000 Units by mouth once a week. 12/21/23   [provider]    Allergies: Naproxen , Hydroxyzine  hcl, Nabumetone, and Penicillins    Review of Systems  Constitutional:  Negative for chills and fever.  HENT:  Negative for ear pain and sore throat.   Eyes:  Negative for pain and visual disturbance.  Respiratory:  Negative for cough and shortness of breath.   Cardiovascular:  Negative for chest pain and palpitations.  Gastrointestinal:  Negative for abdominal pain and vomiting.  Genitourinary:  Negative for dysuria and hematuria.  Musculoskeletal:  Negative for arthralgias  and back pain.  Skin:  Negative for color change and rash.  Neurological:  Negative for seizures and syncope.  All other systems reviewed and are negative.   Updated Vital Signs BP (!) 161/97   Pulse 98   Temp 97.8 F (36.6 C) (Oral)   Resp 18   Ht 5' 3 (1.6 m)   Wt (!) 156.5 kg   LMP 12/07/2023 (Approximate)   SpO2 100%   BMI 61.11 kg/m   Physical Exam Vitals and nursing note reviewed.  Constitutional:      General: She is not in acute distress.    Appearance: Normal appearance. She is well-developed. She is not ill-appearing.  HENT:     Head: Normocephalic and atraumatic.  Eyes:     Conjunctiva/sclera: Conjunctivae normal.  Cardiovascular:     Rate and Rhythm: Normal rate and regular rhythm.     Heart sounds: No murmur heard. Pulmonary:     Effort: Pulmonary effort is normal. No respiratory distress.     Breath sounds: Normal breath sounds.  Abdominal:     Palpations: Abdomen is soft.     Tenderness: There is no abdominal tenderness.  Musculoskeletal:        General: No swelling.     Cervical back: Neck supple.  Skin:    General: Skin is warm and dry.     Capillary Refill: Capillary refill takes less than 2 seconds.  Neurological:     Mental Status: She is alert.  Psychiatric:        Mood and Affect: Mood normal.     (all labs ordered are listed, but only abnormal results are displayed) Labs Reviewed  COMPREHENSIVE METABOLIC PANEL WITH GFR - Abnormal; Notable for the following components:      Result Value   Glucose, Bld 101 (*)    Creatinine, Ser 1.02 (*)    Total Bilirubin 1.5 (*)    All other components within normal limits  CBC WITH DIFFERENTIAL/PLATELET - Abnormal; Notable for the following components:   RBC 5.68 (*)    MCV 72.9 (*)    MCH 21.3 (*)    MCHC 29.2 (*)    RDW 16.2 (*)    Neutro Abs 7.9 (*)    All other components within normal limits  ETHANOL  RAPID URINE DRUG SCREEN, HOSP PERFORMED  HCG, SERUM, QUALITATIVE    EKG: EKG  Interpretation Date/Time:  Wednesday February 29 2024 16:52:56 EDT Ventricular Rate:  92 PR Interval:  140 QRS Duration:  76 QT Interval:  350 QTC Calculation: 432 R Axis:   61  Text Interpretation: Normal sinus rhythm Nonspecific T wave abnormality Compared with EKG from7/09/2023 Confirmed by Gennaro Bouchard ([id number]) on 02/29/2024 8:04:54 PM  Radiology: No results found.   Procedures   Medications Ordered in the ED  melatonin tablet 3 mg (3 mg Oral Not Given 02/29/24 2111)                                    Medical Decision Making Social determinants of health: History of schizophrenia  Patient able to eat and drink but think she is dehydrated.  Also asking for something to sleep.  She asked me for Zyprexa .  I will give her melatonin.  Lab work is unremarkable.  I do not think she would benefit from IV fluids at this time as per lab work she does not appear dehydrated tonight recommended she increase her fluid intake and eat a balanced diet.  Advise close follow-up with her primary care doctor and psychiatrist.  She feels comfortable with the plan to be discharged home.  Advised to return for any new or worsening symptoms.  Problems Addressed: Encounter for medical screening examination: self-limited or minor problem History of schizophrenia: chronic illness or injury Insomnia, unspecified type: acute illness or injury  Amount and/or Complexity of Data Reviewed External Data Reviewed: notes.    Details: Prior ED records reviewed and patient has a history of schizophrenia Labs: ordered. Decision-making details documented in ED Course.    Details: Ordered and reviewed by me and unremarkable ECG/medicine tests: ordered and independent interpretation performed. Decision-making details documented in ED Course.    Details: Ordered and interpreted by me in the absence of cardiology and shows sinus rhythm, no STEMI or acute change when compared to prior  Risk OTC drugs. Prescription  drug management. Diagnosis or treatment significantly limited by social determinants of health.     Final diagnoses:  Insomnia, unspecified type  Encounter for medical screening examination  History of schizophrenia    ED Discharge Orders          Ordered    Melatonin 3 MG CAPS  Nightly        02/29/24 2104               Gennaro Bouchard CROME, DO 02/29/24 2335

## 2024-02-29 NOTE — ED Notes (Addendum)
 Per dayshift staff that were present in the lobby earlier, this pt was screaming in the lobby and shouting Bible verses , exhibiting hypereligious behavior. This was not witnessed by this RN, as I just arrived at 7pm. Pt arrives to lobby nurse desk and states she feels like she needs to be admitted to [facility] for intrusive thoughts. Pt denies any SI/HI or hallucinations but feels she needs a psych eval. Will let the provider know

## 2024-03-01 ENCOUNTER — Ambulatory Visit (HOSPITAL_COMMUNITY)
Admission: EM | Admit: 2024-03-01 | Discharge: 2024-03-01 | Disposition: A | Discharge status: Other Acute Inpatient Hospital | Payer: MEDICAID | Attending: Psychiatry | Admitting: Psychiatry

## 2024-03-01 DIAGNOSIS — R7303 Prediabetes: Secondary | ICD-10-CM | POA: Insufficient documentation

## 2024-03-01 DIAGNOSIS — Z91148 Patient's other noncompliance with medication regimen for other reason: Secondary | ICD-10-CM | POA: Insufficient documentation

## 2024-03-01 DIAGNOSIS — Z7984 Long term (current) use of oral hypoglycemic drugs: Secondary | ICD-10-CM | POA: Diagnosis not present

## 2024-03-01 DIAGNOSIS — Z79899 Other long term (current) drug therapy: Secondary | ICD-10-CM | POA: Insufficient documentation

## 2024-03-01 DIAGNOSIS — F250 Schizoaffective disorder, bipolar type: Secondary | ICD-10-CM | POA: Insufficient documentation

## 2024-03-01 LAB — CBC WITH DIFFERENTIAL/PLATELET
Abs Immature Granulocytes: 0.03 K/uL (ref 0.00–0.07)
Basophils Absolute: 0 K/uL (ref 0.0–0.1)
Basophils Relative: 0 %
Eosinophils Absolute: 0.1 K/uL (ref 0.0–0.5)
Eosinophils Relative: 1 %
HCT: 40.7 % (ref 36.0–46.0)
Hemoglobin: 12.5 g/dL (ref 12.0–15.0)
Immature Granulocytes: 0 %
Lymphocytes Relative: 11 %
Lymphs Abs: 1.2 K/uL (ref 0.7–4.0)
MCH: 21.5 pg — ABNORMAL LOW (ref 26.0–34.0)
MCHC: 30.7 g/dL (ref 30.0–36.0)
MCV: 70.1 fL — ABNORMAL LOW (ref 80.0–100.0)
Monocytes Absolute: 0.7 K/uL (ref 0.1–1.0)
Monocytes Relative: 6 %
Neutro Abs: 9 K/uL — ABNORMAL HIGH (ref 1.7–7.7)
Neutrophils Relative %: 82 %
Platelets: 265 K/uL (ref 150–400)
RBC: 5.81 MIL/uL — ABNORMAL HIGH (ref 3.87–5.11)
RDW: 16.4 % — ABNORMAL HIGH (ref 11.5–15.5)
Smear Review: NORMAL
WBC: 11 K/uL — ABNORMAL HIGH (ref 4.0–10.5)
nRBC: 0 % (ref 0.0–0.2)

## 2024-03-01 LAB — POCT URINE DRUG SCREEN - MANUAL ENTRY (I-SCREEN)
POC Amphetamine UR: NOT DETECTED
POC Buprenorphine (BUP): NOT DETECTED
POC Cocaine UR: NOT DETECTED
POC Marijuana UR: NOT DETECTED
POC Methadone UR: NOT DETECTED
POC Methamphetamine UR: NOT DETECTED
POC Morphine: NOT DETECTED
POC Oxazepam (BZO): NOT DETECTED
POC Oxycodone UR: NOT DETECTED
POC Secobarbital (BAR): NOT DETECTED

## 2024-03-01 LAB — MAGNESIUM: Magnesium: 2.1 mg/dL (ref 1.7–2.4)

## 2024-03-01 LAB — COMPREHENSIVE METABOLIC PANEL WITH GFR
ALT: 16 U/L (ref 0–44)
AST: 18 U/L (ref 15–41)
Albumin: 4.1 g/dL (ref 3.5–5.0)
Alkaline Phosphatase: 62 U/L (ref 38–126)
Anion gap: 13 (ref 5–15)
BUN: 6 mg/dL (ref 6–20)
CO2: 23 mmol/L (ref 22–32)
Calcium: 10.1 mg/dL (ref 8.9–10.3)
Chloride: 102 mmol/L (ref 98–111)
Creatinine, Ser: 1.07 mg/dL — ABNORMAL HIGH (ref 0.44–1.00)
GFR, Estimated: >60 mL/min (ref >60)
Glucose, Bld: 126 mg/dL — ABNORMAL HIGH (ref 70–99)
Potassium: 3.7 mmol/L (ref 3.5–5.1)
Sodium: 138 mmol/L (ref 135–145)
Total Bilirubin: 1.6 mg/dL — ABNORMAL HIGH (ref 0.0–1.2)
Total Protein: 7.7 g/dL (ref 6.5–8.1)

## 2024-03-01 LAB — LITHIUM LEVEL: Lithium Lvl: 0.58 mmol/L — ABNORMAL LOW (ref 0.60–1.20)

## 2024-03-01 LAB — HEMOGLOBIN A1C
Hgb A1c MFr Bld: 5.9 % — ABNORMAL HIGH (ref 4.8–5.6)
Mean Plasma Glucose: 122.63 mg/dL

## 2024-03-01 LAB — TSH: TSH: 1.916 u[IU]/mL (ref 0.350–4.500)

## 2024-03-01 LAB — ETHANOL: Alcohol, Ethyl (B): <15 mg/dL (ref <15)

## 2024-03-01 LAB — LIPID PANEL
Cholesterol: 216 mg/dL — ABNORMAL HIGH (ref 0–200)
HDL: 53 mg/dL (ref >40)
LDL Cholesterol: 143 mg/dL — ABNORMAL HIGH (ref 0–99)
Total CHOL/HDL Ratio: 4.1 ratio
Triglycerides: 99 mg/dL (ref <150)
VLDL: 20 mg/dL (ref 0–40)

## 2024-03-01 LAB — POC URINE PREG, ED: Preg Test, Ur: NEGATIVE

## 2024-03-01 MED ORDER — TRAZODONE HCL 50 MG PO TABS: 50.0000 mg | ORAL_TABLET | Freq: Every evening | ORAL | Status: DC | PRN

## 2024-03-01 MED ORDER — DIPHENHYDRAMINE HCL 50 MG/ML IJ SOLN: 50.0000 mg | Freq: Three times a day (TID) | INTRAMUSCULAR | Status: DC | PRN

## 2024-03-01 MED ORDER — HALOPERIDOL 5 MG PO TABS: 5.0000 mg | ORAL_TABLET | Freq: Three times a day (TID) | ORAL | Status: DC | PRN

## 2024-03-01 MED ORDER — DIPHENHYDRAMINE HCL 50 MG PO CAPS: 50.0000 mg | ORAL_CAPSULE | Freq: Three times a day (TID) | ORAL | Status: DC | PRN

## 2024-03-01 MED ORDER — ACETAMINOPHEN 325 MG PO TABS: 650.0000 mg | ORAL_TABLET | Freq: Four times a day (QID) | ORAL | Status: DC | PRN

## 2024-03-01 MED ORDER — LORAZEPAM 2 MG/ML IJ SOLN: 2.0000 mg | Freq: Three times a day (TID) | INTRAMUSCULAR | Status: DC | PRN

## 2024-03-01 MED ORDER — MAGNESIUM HYDROXIDE 400 MG/5ML PO SUSP: 30.0000 mL | Freq: Every day | ORAL | Status: DC | PRN

## 2024-03-01 MED ORDER — ALUM & MAG HYDROXIDE-SIMETH 200-200-20 MG/5ML PO SUSP: 30.0000 mL | ORAL | Status: DC | PRN

## 2024-03-01 MED ORDER — ARIPIPRAZOLE 5 MG PO TABS
5.0000 mg | ORAL_TABLET | Freq: Every day | ORAL | Status: DC
  Administered 2024-03-01: 5 mg via ORAL
  Filled 2024-03-01: qty 1

## 2024-03-01 MED ORDER — HALOPERIDOL LACTATE 5 MG/ML IJ SOLN: 5.0000 mg | Freq: Three times a day (TID) | INTRAMUSCULAR | Status: DC | PRN

## 2024-03-01 MED ORDER — HALOPERIDOL LACTATE 5 MG/ML IJ SOLN: 10.0000 mg | Freq: Three times a day (TID) | INTRAMUSCULAR | Status: DC | PRN

## 2024-03-01 MED ORDER — METFORMIN HCL 500 MG PO TABS: 500.0000 mg | ORAL_TABLET | Freq: Every day | ORAL | Status: DC

## 2024-03-01 MED ORDER — NICOTINE 21 MG/24HR TD PT24
21.0000 mg | MEDICATED_PATCH | Freq: Every day | TRANSDERMAL | Status: DC
  Filled 2024-03-01: qty 1

## 2024-03-01 NOTE — ED Provider Notes (Signed)
 [facility] Continuous Assessment Admission H&P  Date: 03/01/24 Patient Name: Lindsay [name] MRN: [id number] Chief Complaint: I don't know why I'm here, but I know I'm here to represent the [place]  Diagnoses:  Final diagnoses:  Schizoaffective disorder, bipolar type (HCC)    HPI:  Lindsay [name] 33 y.o., female patient presented to [facility] as a voluntary walk in unaccompanied with complaints of I don't know why I'm here, but I know I'm here to represent the [place] of [address]. Patient was observed sitting in the assessment room, repeatedly saying "Thank you, Jesus." She was difficult to redirect. When the provider was eventually able to engage her briefly, she began vomiting and then resumed saying "Thank you, Jesus." She was able to participate in the assessment for a very short period.  When asked about suicidal or homicidal ideation, she responded, "I don't believe in that." She denied auditory hallucinations. When asked about visual hallucinations, she stated, "You are not Toribio,". When asked about having a psychiatrist, she said I don't believe in them and said she can get a therapist, when asked if she sees one. She reported living with her 89-year-old child. Regarding employment, she stated she was having difficulty working. She denied substance use, saying, "I don't believe in doing drugs." She reported poor sleep and stated she was "full" when asked about appetite. When asked when she last ate, she responded, "I am full of the blood of Jesus Christ."  She reported being prescribed Lithium  600 mg BID, which she stated was last administered today. She acknowledged it was for bipolar disorder but did not fully endorse the diagnosis, saying I don't claim it. She also endorsed being prescribed Hydroxyzine  and Metformin  for pre-diabetes.  The patient was alert and oriented to person, place, and time. Her thought process was tangential and disorganized. Thought  content was preoccupied with religious themes. Concentration was poor, and she demonstrated limited insight and impaired judgment.  She endorsed visual hallucinations.   When asked about her meals, she reported being "full of the blood of Jesus.". Per collateral information from the patient's mother, the patient stated that she was healed.  Her mother reported that the patient called her yesterday stating she was going to [employer]. The mother also reported that the patient has not been compliant with her medications and does not take them as prescribed.  Total Time spent with patient: 1 hour   Musculoskeletal  Strength & Muscle Tone: within normal limits Gait & Station: normal Patient leans: N/A  Psychiatric Specialty Exam  Presentation General Appearance:  Fairly Groomed  Eye Contact: Good  Speech: Normal Rate  Speech Volume: Normal  Handedness: Right   Mood and Affect  Mood: Labile  Affect: Blunt; Labile   Thought Process  Thought Processes: Disorganized  Descriptions of Associations:Tangential  Orientation:Full (Time, Place and Person)  Thought Content:Perseveration; Scattered; Tangential; Illogical  Diagnosis of Schizophrenia or Schizoaffective disorder in past: Yes  Duration of Psychotic Symptoms: Greater than six months  Hallucinations:Hallucinations: Visual Description of Auditory Hallucinations: You are not daniel albert Description of Visual Hallucinations: You are not daniel albert  Ideas of Reference:Delusions  Suicidal Thoughts:Suicidal Thoughts: No  Homicidal Thoughts:Homicidal Thoughts: No   Sensorium  Memory: Immediate Fair; Recent Fair  Judgment: Impaired  Insight: Lacking; Poor   Executive Functions  Concentration: Poor  Attention Span: Poor  Recall: Fair  Fund of Knowledge: Poor  Language: Poor   Psychomotor Activity  Psychomotor Activity: Psychomotor Activity:  Restlessness; Mannerisms   Assets   Assets: Manufacturing systems engineer; Housing; Social Support   Sleep  Sleep: Sleep: Poor (Not so good)   No data recorded  Physical Exam Vitals reviewed.  Constitutional:      Appearance: She is obese.  HENT:     Head: Normocephalic and atraumatic.     Nose: Nose normal.     Mouth/Throat:     Pharynx: Oropharynx is clear.  Cardiovascular:     Rate and Rhythm: Tachycardia present.  Pulmonary:     Effort: Pulmonary effort is normal.  Musculoskeletal:        General: Normal range of motion.     Cervical back: Normal range of motion.  Skin:    General: Skin is warm.  Neurological:     Mental Status: She is alert and oriented to person, place, and time.  Psychiatric:        Attention and Perception: She is inattentive. She perceives visual hallucinations.        Mood and Affect: Affect is blunt.        Speech: Speech is tangential.        Behavior: Behavior is agitated and actively hallucinating. Behavior is cooperative.        Thought Content: Thought content is delusional.        Cognition and Memory: Cognition normal.        Judgment: Judgment is inappropriate.    Review of Systems  Psychiatric/Behavioral:  Positive for hallucinations. The patient has insomnia.   All other systems reviewed and are negative.   Blood pressure 123/79, pulse (!) 103, temperature 99.4 F (37.4 C), temperature source Oral, resp. rate 17, last menstrual period 12/07/2023, SpO2 97%. There is no height or weight on file to calculate BMI.  Past Psychiatric History:  Schizoaffective disorder, bipolar type  Schizophrenia, unspecified type  Schizophrenia spectrum disorder with psychotic disorder type not yet determined  Schizophrenia spectrum disorder with psychotic disorder type not yet determined  Is the patient at risk to self? Yes  Has the patient been a risk to self in the past 6 months? Yes .    Has the patient been a risk to self within the distant past? Yes   Is the patient a risk to  others? Yes   Has the patient been a risk to others in the past 6 months? Yes   Has the patient been a risk to others within the distant past? Yes   Past Medical History: Prediabetes, Mixed hyperlipidemia,Morbid obesity, S/P primary low transverse C-section, Gestational hypertension   Family History: Thyroid  Disease,  Hypertension - Mother; liver cancer - Father; Diabetes, and Cancer- Maternal Grandmother.   Social History: Single, lives with 78 year old child.   Last Labs:  Admission on 02/29/2024, Discharged on 02/29/2024  Component Date Value Ref Range Status   Sodium 02/29/2024 138  135 - 145 mmol/L Final   Potassium 02/29/2024 3.5  3.5 - 5.1 mmol/L Final   Chloride 02/29/2024 103  98 - 111 mmol/L Final   CO2 02/29/2024 22  22 - 32 mmol/L Final   Glucose, Bld 02/29/2024 101 (H)  70 - 99 mg/dL Final   Glucose reference range applies only to samples taken after fasting for at least 8 hours.   BUN 02/29/2024 7  6 - 20 mg/dL Final   Creatinine, Ser 02/29/2024 1.02 (H)  0.44 - 1.00 mg/dL Final   Calcium 90/89/7974 9.8  8.9 - 10.3 mg/dL Final   Total Protein 90/89/7974  7.4  6.5 - 8.1 g/dL Final   Albumin 90/89/7974 4.1  3.5 - 5.0 g/dL Final   AST 90/89/7974 15  15 - 41 U/L Final   ALT 02/29/2024 14  0 - 44 U/L Final   Alkaline Phosphatase 02/29/2024 60  38 - 126 U/L Final   Total Bilirubin 02/29/2024 1.5 (H)  0.0 - 1.2 mg/dL Final   GFR, Estimated 02/29/2024 >60  >60 mL/min Final   Comment: (NOTE) Calculated using the CKD-EPI Creatinine Equation (2021)    Anion gap 02/29/2024 13  5 - 15 Final   Performed at [facility] Lab, [address], [place], [place] [address]   Alcohol, Ethyl (B) 02/29/2024 <15  <15 mg/dL Final   Comment: (NOTE) For medical purposes only. Performed at [facility] Lab, [address] [place], [place] [address]    Opiates 02/29/2024 NONE DETECTED  NONE DETECTED Final   Cocaine 02/29/2024 NONE DETECTED  NONE DETECTED Final   Benzodiazepines  02/29/2024 NONE DETECTED  NONE DETECTED Final   Amphetamines 02/29/2024 NONE DETECTED  NONE DETECTED Final   Tetrahydrocannabinol 02/29/2024 NONE DETECTED  NONE DETECTED Final   Barbiturates 02/29/2024 NONE DETECTED  NONE DETECTED Final   Comment: (NOTE) DRUG SCREEN FOR MEDICAL PURPOSES ONLY.  IF CONFIRMATION IS NEEDED FOR ANY PURPOSE, NOTIFY LAB WITHIN 5 DAYS.  LOWEST DETECTABLE LIMITS FOR URINE DRUG SCREEN Drug Class                     Cutoff (ng/mL) Amphetamine and metabolites    1000 Barbiturate and metabolites    200 Benzodiazepine                 200 Opiates and metabolites        300 Cocaine and metabolites        300 THC                            50 Performed at [facility] Lab, [address], [place], [place] [address]    WBC 02/29/2024 10.2  4.0 - 10.5 K/uL Final   RBC 02/29/2024 5.68 (H)  3.87 - 5.11 MIL/uL Final   Hemoglobin 02/29/2024 12.1  12.0 - 15.0 g/dL Final   HCT 90/89/7974 41.4  36.0 - 46.0 % Final   MCV 02/29/2024 72.9 (L)  80.0 - 100.0 fL Final   MCH 02/29/2024 21.3 (L)  26.0 - 34.0 pg Final   MCHC 02/29/2024 29.2 (L)  30.0 - 36.0 g/dL Final   RDW 90/89/7974 16.2 (H)  11.5 - 15.5 % Final   Platelets 02/29/2024 307  150 - 400 K/uL Final   nRBC 02/29/2024 0.0  0.0 - 0.2 % Final   Neutrophils Relative % 02/29/2024 79  % Final   Neutro Abs 02/29/2024 7.9 (H)  1.7 - 7.7 K/uL Final   Lymphocytes Relative 02/29/2024 14  % Final   Lymphs Abs 02/29/2024 1.5  0.7 - 4.0 K/uL Final   Monocytes Relative 02/29/2024 6  % Final   Monocytes Absolute 02/29/2024 0.7  0.1 - 1.0 K/uL Final   Eosinophils Relative 02/29/2024 1  % Final   Eosinophils Absolute 02/29/2024 0.1  0.0 - 0.5 K/uL Final   Basophils Relative 02/29/2024 0  % Final   Basophils Absolute 02/29/2024 0.0  0.0 - 0.1 K/uL Final   Immature Granulocytes 02/29/2024 0  % Final   Abs Immature Granulocytes 02/29/2024 0.03  0.00 -  0.07 K/uL Final   Performed at [facility], [address]  [place], [place] [address]  Admission on 12/22/2023, Discharged on 12/25/2023  Component Date Value Ref Range Status   Sodium 12/22/2023 136  135 - 145 mmol/L Final   Potassium 12/22/2023 3.5  3.5 - 5.1 mmol/L Final   Chloride 12/22/2023 104  98 - 111 mmol/L Final   CO2 12/22/2023 19 (L)  22 - 32 mmol/L Final   Glucose, Bld 12/22/2023 102 (H)  70 - 99 mg/dL Final   Glucose reference range applies only to samples taken after fasting for at least 8 hours.   BUN 12/22/2023 10  6 - 20 mg/dL Final   Creatinine, Ser 12/22/2023 1.16 (H)  0.44 - 1.00 mg/dL Final   Calcium 92/96/7974 10.0  8.9 - 10.3 mg/dL Final   Total Protein 92/96/7974 8.0  6.5 - 8.1 g/dL Final   Albumin 92/96/7974 4.2  3.5 - 5.0 g/dL Final   AST 92/96/7974 27  15 - 41 U/L Final   ALT 12/22/2023 20  0 - 44 U/L Final   Alkaline Phosphatase 12/22/2023 60  38 - 126 U/L Final   Total Bilirubin 12/22/2023 1.7 (H)  0.0 - 1.2 mg/dL Final   GFR, Estimated 12/22/2023 >60  >60 mL/min Final   Comment: (NOTE) Calculated using the CKD-EPI Creatinine Equation (2021)    Anion gap 12/22/2023 13  5 - 15 Final   Performed at [facility] Lab, [address], [place], [place] [address]   Alcohol, Ethyl (B) 12/22/2023 <15  <15 mg/dL Final   Comment: (NOTE) For medical purposes only. Performed at [facility], [address], [place], [place] [address]    WBC 12/22/2023 13.7 (H)  4.0 - 10.5 K/uL Final   RBC 12/22/2023 5.99 (H)  3.87 - 5.11 MIL/uL Final   Hemoglobin 12/22/2023 12.8  12.0 - 15.0 g/dL Final   HCT 92/96/7974 42.3  36.0 - 46.0 % Final   MCV 12/22/2023 70.6 (L)  80.0 - 100.0 fL Final   MCH 12/22/2023 21.4 (L)  26.0 - 34.0 pg Final   MCHC 12/22/2023 30.3  30.0 - 36.0 g/dL Final   RDW 92/96/7974 15.5  11.5 - 15.5 % Final   Platelets 12/22/2023 324  150 - 400 K/uL Final   nRBC 12/22/2023 0.0  0.0 - 0.2 % Final   Performed at [facility], [address], [place], [place] [address]   Opiates 12/23/2023 NONE DETECTED   NONE DETECTED Final   Cocaine 12/23/2023 NONE DETECTED  NONE DETECTED Final   Benzodiazepines 12/23/2023 POSITIVE (A)  NONE DETECTED Final   Amphetamines 12/23/2023 NONE DETECTED  NONE DETECTED Final   Tetrahydrocannabinol 12/23/2023 NONE DETECTED  NONE DETECTED Final   Barbiturates 12/23/2023 NONE DETECTED  NONE DETECTED Final   Comment: (NOTE) DRUG SCREEN FOR MEDICAL PURPOSES ONLY.  IF CONFIRMATION IS NEEDED FOR ANY PURPOSE, NOTIFY LAB WITHIN 5 DAYS.  LOWEST DETECTABLE LIMITS FOR URINE DRUG SCREEN Drug Class                     Cutoff (ng/mL) Amphetamine and metabolites    1000 Barbiturate and metabolites    200 Benzodiazepine                 200 Opiates and metabolites        300 Cocaine and metabolites        300 THC  50 Performed at [facility] Lab, [address] [place], [place] [address]    hCG, Beta Chain, Earleen, GORMAN 12/22/2023 <1  <5 mIU/mL Final   Comment:          GEST. AGE      CONC.  (mIU/mL)   <=1 WEEK        5 - 50     2 WEEKS       50 - 500     3 WEEKS       100 - 10,000     4 WEEKS     1,000 - 30,000     5 WEEKS     3,500 - 115,000   6-8 WEEKS     12,000 - 270,000    12 WEEKS     15,000 - 220,000        FEMALE AND NON-PREGNANT FEMALE:     LESS THAN 5 mIU/mL Performed at [facility] Lab, [address] [place], [place] [address]    SARS Coronavirus 2 by RT PCR 12/23/2023 NEGATIVE  NEGATIVE Final   Influenza A by PCR 12/23/2023 NEGATIVE  NEGATIVE Final   Influenza B by PCR 12/23/2023 NEGATIVE  NEGATIVE Final   Comment: (NOTE) The Xpert Xpress SARS-CoV-2/FLU/RSV plus assay is intended as an aid in the diagnosis of influenza from Nasopharyngeal swab specimens and should not be used as a sole basis for treatment. Nasal washings and aspirates are unacceptable for Xpert Xpress SARS-CoV-2/FLU/RSV testing.  Fact Sheet for Patients: BloggerCourse.com  Fact Sheet for Healthcare  Providers: SeriousBroker.it  This test is not yet approved or cleared by the [place]  FDA and has been authorized for detection and/or diagnosis of SARS-CoV-2 by FDA under an Emergency Use Authorization (EUA). This EUA will remain in effect (meaning this test can be used) for the duration of the COVID-19 declaration under Section 564(b)(1) of the Act, 21 U.S.C. section 360bbb-3(b)(1), unless the authorization is terminated or revoked.     Resp Syncytial Virus by PCR 12/23/2023 NEGATIVE  NEGATIVE Final   Comment: (NOTE) Fact Sheet for Patients: BloggerCourse.com  Fact Sheet for Healthcare Providers: SeriousBroker.it  This test is not yet approved or cleared by the [place]  FDA and has been authorized for detection and/or diagnosis of SARS-CoV-2 by FDA under an Emergency Use Authorization (EUA). This EUA will remain in effect (meaning this test can be used) for the duration of the COVID-19 declaration under Section 564(b)(1) of the Act, 21 U.S.C. section 360bbb-3(b)(1), unless the authorization is terminated or revoked.  Performed at [facility] Lab, [address], [place], [place] [address]     Allergies: Naproxen , Hydroxyzine  hcl, Nabumetone, and Penicillins  Medications:  Facility Ordered Medications  Medication   acetaminophen  (TYLENOL ) tablet 650 mg   alum & mag hydroxide-simeth (MAALOX/MYLANTA) 200-200-20 MG/5ML suspension 30 mL   magnesium  hydroxide (MILK OF MAGNESIA) suspension 30 mL   haloperidol  (HALDOL ) tablet 5 mg   And   diphenhydrAMINE  (BENADRYL ) capsule 50 mg   haloperidol  lactate (HALDOL ) injection 5 mg   And   diphenhydrAMINE  (BENADRYL ) injection 50 mg   And   LORazepam  (ATIVAN ) injection 2 mg   haloperidol  lactate (HALDOL ) injection 10 mg   And   diphenhydrAMINE  (BENADRYL ) injection 50 mg   And   LORazepam  (ATIVAN ) injection 2 mg   traZODone  (DESYREL ) tablet 50  mg   nicotine  (NICODERM CQ  - dosed in mg/24 hours) patch 21 mg   ARIPiprazole  (ABILIFY ) tablet 5 mg   [  START ON 03/02/2024] metFORMIN  (GLUCOPHAGE ) tablet 500 mg   PTA Medications  Medication Sig   metFORMIN  (GLUCOPHAGE ) 500 MG tablet Take 500 mg by mouth daily.   Melatonin 3 MG CAPS Take 1 capsule (3 mg total) by mouth at bedtime for 14 days.   ibuprofen  (ADVIL ) 800 MG tablet Take 800 mg by mouth 3 (three) times daily as needed (For pain).   lisinopril (ZESTRIL) 10 MG tablet Take 10 mg by mouth 2 (two) times daily.   ferrous sulfate 325 (65 FE) MG tablet Take 325 mg by mouth daily.   lithium  300 MG tablet Take 300 mg by mouth 2 (two) times daily.   hydrOXYzine  (VISTARIL ) 25 MG capsule Take 25 mg by mouth 3 (three) times daily.      Medical Decision Making  Based on the patient's presentation today, she appears to be demonstrating symptoms consistent with psychosis, including hyper religiosity and limited engagement during assessment. She was tangential and repeatedly stated "Thank you, Jesus." Her affect was labile and at times blunt. Speech was repetitive, primarily focused on religious phrases.   Pre-admit orders placed including agitation protocol.  Home Meds Lithium  300mg  - pending lithium  levels Metformin  500mg   Abilify  5mg    Recommendations  The patient requires psychiatric hospitalization for mental health stabilization. She would likely benefit from being restarted on her prescribed medications, as her mother reports that she functions well when adherent to her treatment regimen.  Tosin Sarkis Rhines, NP 03/01/24  11:42 AM

## 2024-03-01 NOTE — ED Notes (Signed)
 Patient A&O x 4, ambulatory. Patient transported [facility] via safe transport. Patient denied SI/HI, A/VH upon discharge.  Pt belongings given to safe transport driver from locker intact. Patient escorted to sallyport via staff for transport to destination. Safety maintained.

## 2024-03-01 NOTE — Discharge Instructions (Addendum)
 Discharge to [facility] facility

## 2024-03-01 NOTE — ED Notes (Signed)
 Pt admitted to observation unit awaiting inpatient stay due to manic, disorganized behaviors. Pt presents preoccupied with religion, loud outburst and responding to internal stimuli. Pt presents with thought blocking behaviors as evident of delayed responses to questions being asked. Denies SI/HI. Blood specimen was completed with encouragement as pt presents suspicious of others. Pt offered meal and drink once skin assessment completed and pt dressed in scrubs and bought to unit. Pt remain compliant. Cooperative throughout interview process. Pt present with periodic outburst stating, Thank you Jesus. Support provided. Pt verbally contract for safety. Will monitor for safety.

## 2024-03-01 NOTE — Progress Notes (Signed)
 Inpatient Psychiatric Referral  Patient was recommended inpatient per Oluwatosin Olasunkanmi, NP. There are no available beds at [facility], per [facility] . Patient was referred to the following out of network facilities:  Destination  Service Provider Address Phone Fax  [facility]  [address], [place] [address] [phone]  [facility]  [address] [place], [place] [address] [phone]  [facility]  [address] [place] [address] [phone]  [facility]  [address], [place] [address] [phone]  [facility]  [address] [place] [address] [phone]  [facility]  [address], [place] [address] [phone]  [facility] EFAX  [address] [place], [place] [phone]  [facility]  [address], [place] [address] [phone]  [facility]  [address] [place] [address] [phone]    Situation ongoing, CSW to continue following and update chart as more information becomes available.  Harrie Sofia MSW, LCSWA 03/01/2024  1:34 PM

## 2024-03-01 NOTE — BH Assessment (Signed)
 Comprehensive Clinical Assessment (CCA) Note  03/01/2024 Lindsay [name] [id number]  DISPOSITION: Per Gaylan Mccoy NP Pt is recommended for inpatient psychiatric admission  The patient demonstrates the following risk factors for suicide: Chronic risk factors for suicide include: psychiatric disorder of schizoaffective d/o. Acute risk factors for suicide include: none reported. Protective factors for this patient include: none reported. Considering these factors, the overall suicide risk at this point appears to be low. Patient is appropriate for outpatient follow up.   Per Triage assessment: "Pt brought to [facility] voluntarily by [facility] due to psychosis. Pt was difficult to redirect and refused to participate in the assessment. Pt was observed praying loudly and appears to be hyper-religious. Pt reports that she has not been sleeping due to the enemy playing with her mind. Pt was poor historian. Unable to assess if pt is SI/ HI/ AVH."  With further assessment: Pt is a 33 yo female who presented via LE exhibiting symptoms of psychosis. Pt was not able to answer many questions appropriately due to psychotic symptoms. Pt had flat affect, tangential thinking and non-sensical conversation. At times, pt seemed lucid and was able to answer questions appropriately but very soon would lapse into "thank you, Jesus" and other hyper-religious comments, loud rhythmic clapping and could not answer simple questions appropriately.   Per chart, hx of schizoaffective d/o and multiple inpatient psychiatric admissions with the last one at [facility] in November 2024. Pt has had several recent ED visits reporting insomnia. Per chart, pt has a hx of paranoia.    Chief Complaint:  Chief Complaint  Patient presents with   Schizophrenia   Hallucinations   Insomnia   Visit Diagnosis:  Schizoaffective d/o    CCA Screening, Triage and Referral (STR)  Patient Reported Information How did you hear about us ? Legal  System  What Is the Reason for Your Visit/Call Today? PT Lindsay [name] 33Y female presents to [facility] unaccompanied, voluntarily. PT has eyes closed, stating thank you Jesus numerous of times throughout triage. PT states she was diagnosed with schizo-effective disorder 2019. PT takes prescribed medications. PT states that for about a week she has not been able to sleep, feels like she is in a different world, not able to make decisions left and right. PT states she thinks she sees people who have died but denies current AVH. PT denies SI, stating I don't believe in that. PT denies alcohol/substance use.  How Long Has This Been Causing You Problems? 1 wk - 1 month  What Do You Feel Would Help You the Most Today? Treatment for Depression or other mood problem; Medication(s)   Have You Recently Had Any Thoughts About Hurting Yourself? No  Are You Planning to Commit Suicide/Harm Yourself At This time? No   Flowsheet Row ED from 03/01/2024 in [facility] ED from 02/29/2024 in [facility] Emergency Department at [facility] ED from 02/28/2024 in [facility] Emergency Department at [facility]  C-SSRS RISK CATEGORY Error: Question 1 not populated No Risk No Risk    Have you Recently Had Thoughts About Hurting Someone [name]? No  Are You Planning to Harm Someone at This Time? No  Explanation: NA   Have You Used Any Alcohol or Drugs in the Past 24 Hours? No  How Long Ago Did You Use Drugs or Alcohol? na What Did You Use and How Much? NA   Do You Currently Have a Therapist/Psychiatrist? No (none reported)  Name of Therapist/Psychiatrist:  Have You Been Recently Discharged From Any Office Practice or Programs? No (none reported)  Explanation of Discharge From Practice/Program: NA     CCA Screening Triage Referral Assessment Type of Contact: Face-to-Face  Telemedicine Service Delivery:   Is this Initial or Reassessment?   Date Telepsych  consult ordered in [facility]:    Time Telepsych consult ordered in [facility]:    Location of Assessment: [facility] Assessment Services  Provider Location: [facility] Assessment Services   Collateral Involvement: NP to call mother, Vertie, for collateral information   Does Patient Have a Automotive engineer Guardian? No  Legal Guardian Contact Information: none listed  Copy of Legal Guardianship Form: -- (na)  Legal Guardian Notified of Arrival: -- (na)  Legal Guardian Notified of Pending Discharge: -- (na)  If Minor and Not Living with Parent(s), Who has Custody? adult  Is CPS involved or ever been involved? -- (none reported)  Is APS involved or ever been involved? -- (none reported)   Patient Determined To Be At Risk for Harm To Self or Others Based on Review of Patient Reported Information or Presenting Complaint? No (Pt was not able to answer many questions appropriately due to psychotic symptoms.)  Method: No Plan  Availability of Means: -- (Pt was not able to answer many questions appropriately due to psychotic symptoms.)  Intent: Vague intent or NA (na)  Notification Required: No need or identified person (denied SI, HI)  Additional Information for Danger to Others Potential: Active psychosis  Additional Comments for Danger to Others Potential: none  Are There Guns or Other Weapons in Your Home? -- (Pt was not able to answer many questions appropriately due to psychotic symptoms.)  Types of Guns/Weapons: na  Are These Weapons Safely Secured?                            -- (na)  Who Could Verify You Are Able To Have These Secured: na  Do You Have any Outstanding Charges, Pending Court Dates, Parole/Probation? Pt was not able to answer many questions appropriately due to psychotic symptoms.  Contacted To Inform of Risk of Harm To Self or Others: -- (to contact mother, Vertie)    Does Patient Present under Involuntary Commitment? No    [place] of Residence:  [place]   Patient Currently Receiving the Following Services: Not Receiving Services (none reported)   Determination of Need: Emergent (2 hours) (Per Gaylan Mccoy NP Pt is recommended for inpatient psychiatric admission)   Options For Referral: Inpatient Hospitalization     CCA Biopsychosocial Patient Reported Schizophrenia/Schizoaffective Diagnosis in Past: Yes   Strengths: NA   Mental Health Symptoms Depression:  Change in energy/activity; Tearfulness; Difficulty Concentrating; Irritability   Duration of Depressive symptoms: Duration of Depressive Symptoms: -- (Pt was not able to answer many questions appropriately due to psychotic symptoms.)   Mania:  Change in energy/activity; Irritability; Racing thoughts   Anxiety:   Difficulty concentrating; Irritability; Restlessness   Psychosis:  Hallucinations; Grossly disorganized or catatonic behavior   Duration of Psychotic symptoms: Duration of Psychotic Symptoms: Greater than six months   Trauma:  None (none reported)   Obsessions:  None   Compulsions:  None   Inattention:  None   Hyperactivity/Impulsivity:  N/A   Oppositional/Defiant Behaviors:  N/A   Emotional Irregularity:  Mood lability   Other Mood/Personality Symptoms:  none    Mental Status Exam Appearance and self-care  Stature:  Average  Weight:  Obese   Clothing:  Neat/clean; Age-appropriate   Grooming:  Neglected (vomited and did not want to clean her mouth afterwards)   Cosmetic use:  None   Posture/gait:  Bizarre (bending over table in front of her)   Motor activity:  Restless; Repetitive (clapping, singing)   Sensorium  Attention:  Confused   Concentration:  Scattered   Orientation:  Place; Person   Recall/memory:  Defective in Short-term; Defective in Recent; Defective in Remote; Defective in Immediate (Pt was not able to answer many questions appropriately due to psychotic symptoms.)   Affect and Mood  Affect:   Inappropriate; Flat   Mood:  Anxious; Irritable   Relating  Eye contact:  Fleeting   Facial expression:  Constricted; Tense   Attitude toward examiner:  Cooperative; Defensive; Guarded; Irritable   Thought and Language  Speech flow: Flight of Ideas; Loud   Thought content:  Suspicious   Preoccupation:  Religion   Hallucinations:  -- (Pt was not able to answer many questions appropriately due to psychotic symptoms. Pt does not appear to be responding to internal stimuli.)   Organization:  Disorganized   Company secretary of Knowledge:  Fair   Intelligence:  -- (Pt was not able to answer many questions appropriately due to psychotic symptoms.)   Abstraction:  Normal   Judgement:  Impaired   Reality Testing:  Distorted   Insight:  Fair   Decision Making:  Confused   Social Functioning  Social Maturity:  -- (unable to access due to psychotic symptoms)   Social Judgement:  -- (unable to access due to psychotic symptoms)   Stress  Stressors:  Work (Pt was not able to answer many questions appropriately due to psychotic symptoms.)   Coping Ability:  Overwhelmed; Exhausted   Skill Deficits:  -- (unable to access due to psychotic symptoms)   Supports:  Family; Friends/Service system     Religion: Religion/Spirituality Are You A Religious Person?: Yes What is Your Religious Affiliation?: Christian How Might This Affect Treatment?: Pt is hyper religious  Leisure/Recreation: Leisure / Recreation Do You Have Hobbies?:  (unable to access due to psychotic symptoms)  Exercise/Diet: Exercise/Diet Do You Exercise?:  (unable to access due to psychotic symptoms) Have You Gained or Lost A Significant Amount of Weight in the Past Six Months?:  (unable to access due to psychotic symptoms) Do You Follow a Special Diet?:  (unable to access due to psychotic symptoms) Do You Have Any Trouble Sleeping?: Yes Explanation of Sleeping Difficulties: Pt reported insomnia  and has had several ED visits recently for insomnia.   CCA Employment/Education Employment/Work Situation: Employment / Work Situation Employment Situation: Employed (per chart) Work Stressors: none reported Patient's Job has Been Impacted by Current Illness:  (unable to access due to psychotic symptoms) Has Patient ever Been in the [employer]?: No (per chart)  Education: Education Is Patient Currently Attending School?: No (per chart) Last Grade Completed: 12 (per chart) Did You Attend College?: No (per chart) Did You Have An Individualized Education Program (IIEP): No (per chart) Did You Have Any Difficulty At School?: No (per chart)   CCA Family/Childhood History Family and Relationship History: Family history Marital status: Single (per chart) Does patient have children?: Yes (per chart) How many children?: 1 (per chart)  Childhood History:  Childhood History By whom was/is the patient raised?: Both parents (per chart) Did patient suffer any verbal/emotional/physical/sexual abuse as a child?: No (per chart) Has patient ever been sexually abused/assaulted/raped as  an adolescent or adult?: No (per chart) Witnessed domestic violence?: No (per chart) Has patient been affected by domestic violence as an adult?: No (per chart)       CCA Substance Use Alcohol/Drug Use: Alcohol / Drug Use Pain Medications: See MAR Prescriptions: See MAR Over the Counter: See MAR History of alcohol / drug use?: No history of alcohol / drug abuse (Pt denies any substance use.) Longest period of sobriety (when/how long): n/a Negative Consequences of Use:  (n/a) Withdrawal Symptoms:  (n/a)                         ASAM's:  Six Dimensions of Multidimensional Assessment  Dimension 1:  Acute Intoxication and/or Withdrawal Potential:      Dimension 2:  Biomedical Conditions and Complications:      Dimension 3:  Emotional, Behavioral, or Cognitive Conditions and Complications:      Dimension 4:  Readiness to Change:     Dimension 5:  Relapse, Continued use, or Continued Problem Potential:     Dimension 6:  Recovery/Living Environment:     ASAM Severity Score:    ASAM Recommended Level of Treatment: ASAM Recommended Level of Treatment:  (n/a)   Substance use Disorder (SUD) Substance Use Disorder (SUD)  Checklist Symptoms of Substance Use:  (n/a)  Recommendations for Services/Supports/Treatments: Recommendations for Services/Supports/Treatments Recommendations For Services/Supports/Treatments: Inpatient Hospitalization  Disposition Recommendation per psychiatric provider: We recommend inpatient psychiatric hospitalization when medically cleared. Patient is under voluntary admission status at this time; please IVC if attempts to leave hospital. Per Gaylan Mccoy NP pt is recommended for inpatient psychiatric admission   DSM5 Diagnoses: Patient Active Problem List   Diagnosis Date Noted   Schizoaffective disorder, bipolar type (HCC) 05/14/2023   Schizophrenia, unspecified type (HCC) 05/13/2023   Hyperprolactinemia (HCC) 02/01/2023   Prediabetes 02/01/2023   Mixed hyperlipidemia 02/01/2023   Morbid obesity (HCC) 02/01/2023   Acanthosis nigricans 02/01/2023   Schizophrenia spectrum disorder with psychotic disorder type not yet determined (HCC) 09/16/2020   Normal postpartum course 01/29/2019   S/P primary low transverse C-section 01/29/2019   Gestational hypertension 01/24/2019   Gestational hypertension, third trimester 01/24/2019   Transient hypertension 01/12/2019     Referrals to Alternative Service(s): Referred to Alternative Service(s):   Place:   Date:   Time:    Referred to Alternative Service(s):   Place:   Date:   Time:    Referred to Alternative Service(s):   Place:   Date:   Time:    Referred to Alternative Service(s):   Place:   Date:   Time:     Rayola Everhart T, Counselor

## 2024-03-01 NOTE — Progress Notes (Signed)
 Pt has been accepted to [facility] on 03/01/24 . Bed assignment:Unit 100   Pt meets inpatient criteria per Gaylan Mccoy, NP   Attending Physician will be Dr. Oneil Charleston   Report can be called to: [phone]  Pt can arrive after asap   Care Team Notified: Lajuana Nett, RN, Gaylan Mccoy, NP

## 2024-03-01 NOTE — ED Notes (Signed)
 Pt accepted to [facility] for continued tx. Nurse to nurse report given to King, RN on Unit [address] @[facility]. Pt made aware and agree to transport. Safety maintained.

## 2024-03-02 LAB — PROLACTIN: Prolactin: 205 ng/mL — ABNORMAL HIGH (ref 4.8–33.4)

## 2024-03-29 ENCOUNTER — Ambulatory Visit (HOSPITAL_COMMUNITY): Admission: EM | Admit: 2024-03-29 | Discharge: 2024-03-29 | Disposition: A | Discharge status: Home / Self Care

## 2024-05-01 ENCOUNTER — Encounter (HOSPITAL_COMMUNITY): Payer: Self-pay | Admitting: *Deleted

## 2024-05-01 ENCOUNTER — Emergency Department (HOSPITAL_COMMUNITY)
Admission: EM | Admit: 2024-05-01 | Discharge: 2024-05-02 | Disposition: A | Discharge status: Other Acute Inpatient Hospital | Payer: MEDICAID | Source: Home / Self Care | Attending: Emergency Medicine | Admitting: Emergency Medicine

## 2024-05-01 ENCOUNTER — Other Ambulatory Visit: Payer: Self-pay

## 2024-05-01 DIAGNOSIS — Z79899 Other long term (current) drug therapy: Secondary | ICD-10-CM | POA: Insufficient documentation

## 2024-05-01 DIAGNOSIS — F319 Bipolar disorder, unspecified: Secondary | ICD-10-CM | POA: Insufficient documentation

## 2024-05-01 DIAGNOSIS — Z794 Long term (current) use of insulin: Secondary | ICD-10-CM | POA: Insufficient documentation

## 2024-05-01 DIAGNOSIS — R4589 Other symptoms and signs involving emotional state: Secondary | ICD-10-CM

## 2024-05-01 DIAGNOSIS — F250 Schizoaffective disorder, bipolar type: Secondary | ICD-10-CM | POA: Insufficient documentation

## 2024-05-01 DIAGNOSIS — Z91148 Patient's other noncompliance with medication regimen for other reason: Secondary | ICD-10-CM

## 2024-05-01 DIAGNOSIS — F312 Bipolar disorder, current episode manic severe with psychotic features: Secondary | ICD-10-CM | POA: Diagnosis not present

## 2024-05-01 LAB — ETHANOL: Alcohol, Ethyl (B): <15 mg/dL (ref <15)

## 2024-05-01 LAB — CBC WITH DIFFERENTIAL/PLATELET
Abs Immature Granulocytes: 0.02 K/uL (ref 0.00–0.07)
Basophils Absolute: 0 K/uL (ref 0.0–0.1)
Basophils Relative: 0 %
Eosinophils Absolute: 0.1 K/uL (ref 0.0–0.5)
Eosinophils Relative: 2 %
HCT: 43.8 % (ref 36.0–46.0)
Hemoglobin: 13.4 g/dL (ref 12.0–15.0)
Immature Granulocytes: 0 %
Lymphocytes Relative: 18 %
Lymphs Abs: 1.4 K/uL (ref 0.7–4.0)
MCH: 21.5 pg — ABNORMAL LOW (ref 26.0–34.0)
MCHC: 30.6 g/dL (ref 30.0–36.0)
MCV: 70.3 fL — ABNORMAL LOW (ref 80.0–100.0)
Monocytes Absolute: 0.6 K/uL (ref 0.1–1.0)
Monocytes Relative: 7 %
Neutro Abs: 5.9 K/uL (ref 1.7–7.7)
Neutrophils Relative %: 73 %
Platelets: 310 K/uL (ref 150–400)
RBC: 6.23 MIL/uL — ABNORMAL HIGH (ref 3.87–5.11)
RDW: 15 % (ref 11.5–15.5)
WBC: 8.1 K/uL (ref 4.0–10.5)
nRBC: 0 % (ref 0.0–0.2)

## 2024-05-01 LAB — COMPREHENSIVE METABOLIC PANEL WITH GFR
ALT: 14 U/L (ref 0–44)
AST: 19 U/L (ref 15–41)
Albumin: 4.7 g/dL (ref 3.5–5.0)
Alkaline Phosphatase: 74 U/L (ref 38–126)
Anion gap: 19 — ABNORMAL HIGH (ref 5–15)
BUN: 6 mg/dL (ref 6–20)
CO2: 20 mmol/L — ABNORMAL LOW (ref 22–32)
Calcium: 10.1 mg/dL (ref 8.9–10.3)
Chloride: 100 mmol/L (ref 98–111)
Creatinine, Ser: 0.86 mg/dL (ref 0.44–1.00)
GFR, Estimated: >60 mL/min (ref >60)
Glucose, Bld: 90 mg/dL (ref 70–99)
Potassium: 3.5 mmol/L (ref 3.5–5.1)
Sodium: 139 mmol/L (ref 135–145)
Total Bilirubin: 1 mg/dL (ref 0.0–1.2)
Total Protein: 8.1 g/dL (ref 6.5–8.1)

## 2024-05-01 LAB — LITHIUM LEVEL: Lithium Lvl: <0.1 mmol/L — ABNORMAL LOW (ref 0.60–1.20)

## 2024-05-01 LAB — SALICYLATE LEVEL: Salicylate Lvl: <7 mg/dL — ABNORMAL LOW (ref 7.0–30.0)

## 2024-05-01 MED ORDER — RISPERIDONE 1 MG/ML PO SOLN
1.0000 mg | Freq: Every day | ORAL | Status: DC
  Filled 2024-05-01: qty 1

## 2024-05-01 MED ORDER — LACTATED RINGERS IV BOLUS
1000.0000 mL | Freq: Once | INTRAVENOUS | Status: DC
Start: 2024-05-01 — End: 2024-05-01

## 2024-05-01 MED ORDER — RISPERIDONE 1 MG PO TBDP: 1.0000 mg | ORAL_TABLET | Freq: Once | ORAL | Status: DC

## 2024-05-01 NOTE — ED Notes (Addendum)
 Provider canceled fluids; ordered to have pt drink 1 liter of water . Pt agreed to this and began drinking the water .

## 2024-05-01 NOTE — ED Notes (Signed)
 Pt in room screaming, Hallelujah! Hallelujah!SABRA

## 2024-05-01 NOTE — ED Triage Notes (Signed)
 Pt states she is here due to feelings of sadness and states I feel like I was drugged pt denies SI or HI.  Denies any street drugs or ETOH.  Pt states she stopped her medication Lithium  herself. Pt states she has been dx'd with bipolar.

## 2024-05-01 NOTE — ED Notes (Signed)
 Pt ambulatory to bathroom, pt made aware of need for urine sample

## 2024-05-01 NOTE — ED Provider Notes (Signed)
 [place] EMERGENCY DEPARTMENT AT [facility] Provider Note   CSN: [id number] Arrival date & time: 05/01/24  1420     Patient presents with: V70.1   Lindsay [name] is a 32 y.o. female.  Patient declined to talk to me, she was sleeping, easily woke up to voice when her friend spoke to her.  I introduced myself and asked her why she was here.  She states no thank you.  When I stated I was trying to determine how to best help her, she reported none who have trespass against me shall prosper, and the name of Jesus you shall leave. She has history of schizoaffective disorder bipolar type per her friend at bedside who provides much of the history.  She stopped taking her Lamictal about a week ago and had reported to friends and family that she felt like people were coming into her house and thought she was being drugged, she has been tired and sleeping a lot.  No other complaints.   HPI     Prior to Admission medications   Medication Sig Start Date End Date Taking? Authorizing Provider  calcium carbonate (TUMS - DOSED IN MG ELEMENTAL CALCIUM) 500 MG chewable tablet Chew 1 tablet by mouth daily as needed for indigestion or heartburn.   Yes [provider]  lisinopril (ZESTRIL) 10 MG tablet Take 10 mg by mouth 2 (two) times daily.   Yes [provider]  melatonin 5 MG TABS Take 5 mg by mouth at bedtime as needed (sleep).   Yes [provider]  risperiDONE  (RISPERDAL ) 1 MG tablet Take 1 mg by mouth 2 (two) times daily.   Yes [provider]  Vitamin D, Ergocalciferol, (DRISDOL) 1.25 MG (50000 UNIT) CAPS capsule Take 50,000 Units by mouth every 3 (three) months. 03/11/24  Yes [provider]  lithium  300 MG tablet Take 300 mg by mouth 2 (two) times daily. Patient not taking: Reported on 05/01/2024 01/24/24   [provider]  MOUNJARO 2.5 MG/0.5ML Pen Inject 2.5 mg into the skin once a week. Patient not taking: Reported on  05/01/2024 04/03/24   [provider]    Allergies: Naproxen , Hydroxyzine  hcl, Nabumetone, and Penicillins    Review of Systems  Updated Vital Signs BP (!) 162/71 (BP Location: Left Arm)   Pulse 96   Temp 97.6 F (36.4 C) (Oral)   Resp 18   Ht 5' 3 (1.6 m)   Wt (!) 156.5 kg   LMP  (LMP Unknown)   SpO2 100%   BMI 61.12 kg/m   Physical Exam Vitals and nursing note reviewed.  Constitutional:      General: She is not in acute distress.    Appearance: She is well-developed.  HENT:     Head: Normocephalic and atraumatic.     Mouth/Throat:     Mouth: Mucous membranes are moist.  Eyes:     Extraocular Movements: Extraocular movements intact.     Conjunctiva/sclera: Conjunctivae normal.     Pupils: Pupils are equal, round, and reactive to light.  Cardiovascular:     Rate and Rhythm: Normal rate and regular rhythm.     Heart sounds: No murmur heard. Pulmonary:     Effort: Pulmonary effort is normal. No respiratory distress.     Breath sounds: Normal breath sounds.  Abdominal:     Palpations: Abdomen is soft.     Tenderness: There is no abdominal tenderness.  Musculoskeletal:        General:  No swelling.     Cervical back: Neck supple.  Skin:    General: Skin is warm and dry.     Capillary Refill: Capillary refill takes less than 2 seconds.  Neurological:     General: No focal deficit present.     Mental Status: She is alert and oriented to person, place, and time.  Psychiatric:        Mood and Affect: Mood normal.     (all labs ordered are listed, but only abnormal results are displayed) Labs Reviewed  COMPREHENSIVE METABOLIC PANEL WITH GFR - Abnormal; Notable for the following components:      Result Value   CO2 20 (*)    Anion gap 19 (*)    All other components within normal limits  CBC WITH DIFFERENTIAL/PLATELET - Abnormal; Notable for the following components:   RBC 6.23 (*)    MCV 70.3 (*)    MCH 21.5 (*)    All other components within normal  limits  LITHIUM  LEVEL - Abnormal; Notable for the following components:   Lithium  Lvl <0.10 (*)    All other components within normal limits  SALICYLATE LEVEL - Abnormal; Notable for the following components:   Salicylate Lvl <7.0 (*)    All other components within normal limits  ETHANOL  URINE DRUG SCREEN  LACTIC ACID, PLASMA  POC URINE PREG, ED    EKG: None  Radiology: No results found.   Procedures   Medications Ordered in the ED  risperiDONE  (RISPERDAL  M-TABS) disintegrating tablet 1 mg (0 mg Oral Hold 05/01/24 2310)                                    Medical Decision Making Differential diagnosis includes but not limited to depression, mania, mood disorder, substance use, medication noncompliance, psychosis, metabolic encephalopathy, other  ED course: Patient presents for psychiatric evaluation, states she has felt like she is being drugged and thinks people are coming to her house, has been off her Lamictal for about a week.  Schizoaffective disorder bipolar type.  Patient gives me very minimal history herself, refusing to speak with me but declines physical complaints, she did allow me to examine her and she is normal lung sounds, moves her extremities equally.  Will obtain basic medical screening workup and consult TTS.  Patient refused any workup, stating I want to use a holistic approach.  IVC taken out at this time so we can get medical clearance for psychiatric evaluation.  I spoke with patient's mother and cousin who are both listed as emergency contacts for collateral, they note that she has become hyperreligious, stopped taking her medicines which is what happens when she stops taking her medicines.  Patient's labs did show CO2 20 and anion gap 19, possibly due to dehydration decreased p.o. intake, salicylate level is negative, pending a lactic acid level and IV fluids but patient wanted to take risperidone  before complying with this so this was ordered.  She  will need psychiatric hospitalization, at this time signed out to Dr. Melvenia.   Amount and/or Complexity of Data Reviewed Labs: ordered.  Risk Prescription drug management.        Final diagnoses:  None    ED Discharge Orders          Ordered    Salicylate level  Status:  Canceled        05/01/24 2000  Suellen Sherran DELENA DEVONNA 05/01/24 2355    Towana Ozell BROCKS, MD 05/02/24 1006

## 2024-05-01 NOTE — ED Notes (Signed)
 Pt refusing blood draws; ignoring ED staff.

## 2024-05-01 NOTE — ED Notes (Signed)
Pt resting with visitor in room.

## 2024-05-01 NOTE — ED Notes (Signed)
 Started to attempt iv pt refused stating she believes in holistic care. She states she will drink water  but does not want an iv.

## 2024-05-01 NOTE — ED Notes (Signed)
 Pt refusing EKG at this time. Stated she just wants to rest right now.

## 2024-05-01 NOTE — ED Notes (Signed)
 Pt provided consent and allowed phlebotomist to get blood.

## 2024-05-01 NOTE — ED Notes (Signed)
 Pt refused lactic acid blood draw.

## 2024-05-01 NOTE — ED Notes (Signed)
POC urine was neg

## 2024-05-01 NOTE — BH Assessment (Signed)
 Clinician spoke with IRIS to complete pt's TTS assessment. Clinician provided pt's name, MRN, location, age, room number and provider's name. Secure message completed.    Iris coordinator to update secure chat when assessment time and provider are assigned.  Jackson JONETTA Broach, MS, [facility], [facility] Triage Specialist [phone]

## 2024-05-01 NOTE — ED Notes (Signed)
 Pt stated that she see's people coming in and out of her apartment at night and that someone is drugging her. Pt is also very religious.

## 2024-05-02 ENCOUNTER — Inpatient Hospital Stay (HOSPITAL_COMMUNITY)
Admission: AD | Admit: 2024-05-02 | Discharge: 2024-05-12 | DRG: 885 | Disposition: A | Discharge status: Home / Self Care | Payer: MEDICAID | Source: Intra-hospital

## 2024-05-02 ENCOUNTER — Other Ambulatory Visit: Payer: Self-pay

## 2024-05-02 ENCOUNTER — Encounter (HOSPITAL_COMMUNITY): Payer: Self-pay | Admitting: Psychiatry

## 2024-05-02 DIAGNOSIS — I10 Essential (primary) hypertension: Secondary | ICD-10-CM | POA: Diagnosis not present

## 2024-05-02 DIAGNOSIS — Z833 Family history of diabetes mellitus: Secondary | ICD-10-CM | POA: Diagnosis not present

## 2024-05-02 DIAGNOSIS — Z91148 Patient's other noncompliance with medication regimen for other reason: Secondary | ICD-10-CM

## 2024-05-02 DIAGNOSIS — Z560 Unemployment, unspecified: Secondary | ICD-10-CM

## 2024-05-02 DIAGNOSIS — Z7984 Long term (current) use of oral hypoglycemic drugs: Secondary | ICD-10-CM

## 2024-05-02 DIAGNOSIS — Z8349 Family history of other endocrine, nutritional and metabolic diseases: Secondary | ICD-10-CM

## 2024-05-02 DIAGNOSIS — E119 Type 2 diabetes mellitus without complications: Secondary | ICD-10-CM | POA: Diagnosis present

## 2024-05-02 DIAGNOSIS — Z79899 Other long term (current) drug therapy: Secondary | ICD-10-CM | POA: Diagnosis not present

## 2024-05-02 DIAGNOSIS — E559 Vitamin D deficiency, unspecified: Secondary | ICD-10-CM | POA: Diagnosis present

## 2024-05-02 DIAGNOSIS — Z6841 Body Mass Index (BMI) 40.0 and over, adult: Secondary | ICD-10-CM

## 2024-05-02 DIAGNOSIS — F312 Bipolar disorder, current episode manic severe with psychotic features: Secondary | ICD-10-CM | POA: Diagnosis not present

## 2024-05-02 DIAGNOSIS — E782 Mixed hyperlipidemia: Secondary | ICD-10-CM | POA: Diagnosis present

## 2024-05-02 DIAGNOSIS — Z7985 Long-term (current) use of injectable non-insulin antidiabetic drugs: Secondary | ICD-10-CM

## 2024-05-02 DIAGNOSIS — Z809 Family history of malignant neoplasm, unspecified: Secondary | ICD-10-CM

## 2024-05-02 DIAGNOSIS — F419 Anxiety disorder, unspecified: Secondary | ICD-10-CM | POA: Diagnosis present

## 2024-05-02 DIAGNOSIS — Z8249 Family history of ischemic heart disease and other diseases of the circulatory system: Secondary | ICD-10-CM

## 2024-05-02 DIAGNOSIS — F250 Schizoaffective disorder, bipolar type: Principal | ICD-10-CM | POA: Diagnosis present

## 2024-05-02 DIAGNOSIS — J45909 Unspecified asthma, uncomplicated: Secondary | ICD-10-CM | POA: Diagnosis present

## 2024-05-02 LAB — URINE DRUG SCREEN
Amphetamines: NEGATIVE
Barbiturates: NEGATIVE
Benzodiazepines: NEGATIVE
Cocaine: NEGATIVE
Fentanyl: NEGATIVE
Methadone Scn, Ur: NEGATIVE
Opiates: NEGATIVE
Tetrahydrocannabinol: NEGATIVE

## 2024-05-02 LAB — LACTIC ACID, PLASMA: Lactic Acid, Venous: 0.8 mmol/L (ref 0.5–1.9)

## 2024-05-02 MED ORDER — HALOPERIDOL 5 MG PO TABS: 5.0000 mg | ORAL_TABLET | Freq: Three times a day (TID) | ORAL | Status: DC | PRN

## 2024-05-02 MED ORDER — STERILE WATER FOR INJECTION IJ SOLN
INTRAMUSCULAR | Status: AC
  Filled 2024-05-02: qty 10

## 2024-05-02 MED ORDER — ZIPRASIDONE MESYLATE 20 MG IM SOLR
20.0000 mg | Freq: Once | INTRAMUSCULAR | Status: AC
  Administered 2024-05-02: 20 mg via INTRAMUSCULAR
  Filled 2024-05-02: qty 20

## 2024-05-02 MED ORDER — HALOPERIDOL LACTATE 5 MG/ML IJ SOLN: 5.0000 mg | Freq: Three times a day (TID) | INTRAMUSCULAR | Status: DC | PRN

## 2024-05-02 MED ORDER — LORAZEPAM 2 MG/ML IJ SOLN
2.0000 mg | Freq: Once | INTRAMUSCULAR | Status: AC
  Administered 2024-05-02: 2 mg via INTRAMUSCULAR
  Filled 2024-05-02: qty 1

## 2024-05-02 MED ORDER — LORAZEPAM 2 MG/ML IJ SOLN: 2.0000 mg | Freq: Three times a day (TID) | INTRAMUSCULAR | Status: DC | PRN

## 2024-05-02 MED ORDER — RISPERIDONE 1 MG PO TABS
1.0000 mg | ORAL_TABLET | Freq: Two times a day (BID) | ORAL | Status: DC
  Administered 2024-05-02 – 2024-05-06 (×8): 1 mg via ORAL
  Filled 2024-05-02 (×8): qty 1

## 2024-05-02 MED ORDER — MAGNESIUM HYDROXIDE 400 MG/5ML PO SUSP: 30.0000 mL | Freq: Every day | ORAL | Status: DC | PRN

## 2024-05-02 MED ORDER — DIPHENHYDRAMINE HCL 50 MG/ML IJ SOLN
12.5000 mg | Freq: Once | INTRAMUSCULAR | Status: AC
  Administered 2024-05-02: 12.5 mg via INTRAMUSCULAR
  Filled 2024-05-02: qty 1

## 2024-05-02 MED ORDER — LORAZEPAM 2 MG/ML IJ SOLN
1.0000 mg | Freq: Once | INTRAMUSCULAR | Status: DC
  Filled 2024-05-02: qty 1

## 2024-05-02 MED ORDER — MELATONIN 5 MG PO TABS
5.0000 mg | ORAL_TABLET | Freq: Every evening | ORAL | Status: DC | PRN
  Administered 2024-05-02 – 2024-05-11 (×9): 5 mg via ORAL
  Filled 2024-05-02 (×9): qty 1

## 2024-05-02 MED ORDER — LORAZEPAM 2 MG/ML IJ SOLN
2.0000 mg | Freq: Three times a day (TID) | INTRAMUSCULAR | Status: DC | PRN
  Filled 2024-05-02: qty 1

## 2024-05-02 MED ORDER — STERILE WATER FOR INJECTION IJ SOLN
INTRAMUSCULAR | Status: AC
  Administered 2024-05-02: 1.2 mL
  Filled 2024-05-02: qty 10

## 2024-05-02 MED ORDER — DIPHENHYDRAMINE HCL 50 MG/ML IJ SOLN
50.0000 mg | Freq: Three times a day (TID) | INTRAMUSCULAR | Status: DC | PRN
  Filled 2024-05-02: qty 1

## 2024-05-02 MED ORDER — WHITE PETROLATUM EX OINT
TOPICAL_OINTMENT | CUTANEOUS | Status: AC
  Administered 2024-05-02: 1
  Filled 2024-05-02: qty 5

## 2024-05-02 MED ORDER — DIPHENHYDRAMINE HCL 50 MG/ML IJ SOLN: 50.0000 mg | Freq: Three times a day (TID) | INTRAMUSCULAR | Status: DC | PRN

## 2024-05-02 MED ORDER — ZIPRASIDONE MESYLATE 20 MG IM SOLR
10.0000 mg | Freq: Once | INTRAMUSCULAR | Status: DC
  Filled 2024-05-02: qty 20

## 2024-05-02 MED ORDER — DIPHENHYDRAMINE HCL 25 MG PO CAPS: 50.0000 mg | ORAL_CAPSULE | Freq: Three times a day (TID) | ORAL | Status: DC | PRN

## 2024-05-02 MED ORDER — ACETAMINOPHEN 325 MG PO TABS: 650.0000 mg | ORAL_TABLET | Freq: Four times a day (QID) | ORAL | Status: DC | PRN

## 2024-05-02 MED ORDER — HALOPERIDOL LACTATE 5 MG/ML IJ SOLN
10.0000 mg | Freq: Three times a day (TID) | INTRAMUSCULAR | Status: DC | PRN
  Filled 2024-05-02: qty 2

## 2024-05-02 MED ORDER — LITHIUM CARBONATE 300 MG PO CAPS
300.0000 mg | ORAL_CAPSULE | Freq: Two times a day (BID) | ORAL | Status: DC
  Administered 2024-05-02 – 2024-05-03 (×2): 300 mg via ORAL
  Filled 2024-05-02 (×2): qty 1

## 2024-05-02 MED ORDER — ALUM & MAG HYDROXIDE-SIMETH 200-200-20 MG/5ML PO SUSP: 30.0000 mL | ORAL | Status: DC | PRN

## 2024-05-02 NOTE — Tx Team (Signed)
 Initial Treatment Plan 05/02/2024 9:28 PM Lindsay [name] FMW:[id number]    PATIENT STRESSORS: Marital or family conflict   Medication change or noncompliance     PATIENT STRENGTHS: General fund of knowledge  Motivation for treatment/growth  Supportive family/friends    PATIENT IDENTIFIED PROBLEMS: Medication non-compliance  paranoia  Being able to continue to witness to people and be an encouragement , fulfill God's plan for my life                 DISCHARGE CRITERIA:  Improved stabilization in mood, thinking, and/or behavior Verbal commitment to aftercare and medication compliance  PRELIMINARY DISCHARGE PLAN: Attend aftercare/continuing care group Attend PHP/IOP Outpatient therapy  PATIENT/FAMILY INVOLVEMENT: This treatment plan has been presented to and reviewed with the patient, Lindsay [name] .The patient and family have been given the opportunity to ask questions and make suggestions.  Orlando Ozell DELENA, RN 05/02/2024, 9:28 PM

## 2024-05-02 NOTE — BH Assessment (Signed)
 Per Ines Hock, NP: Pt is not receptive to participating. please reach out when pt is able to participate. Also please make sure pt is up and ready to participate prior to provider getting on camera.    Jackson JONETTA Broach, MS, [facility], [facility] Triage Specialist [phone]

## 2024-05-02 NOTE — ED Notes (Signed)
 Pt walked out of room with cell phone in hand- psychologist, forensic at bedside toalking with pt, informed officer that pt is not supposed to have her phone but will not give it up- pt also has refused to dress out

## 2024-05-02 NOTE — ED Notes (Signed)
 Pt has on clothing she came in wearing- has not dressed out- pt has her cell phone and will not give it to staff.

## 2024-05-02 NOTE — ED Notes (Signed)
 Staff no longer in room with pt- sitter present sitting outside of glass doors- pt continues to shout and pray repetitively while walking around room.

## 2024-05-02 NOTE — ED Notes (Signed)
 Pt saying she wants to leave- pt with IVC paperwork in place, Allied officer made aware and is at bedside talking with pt. Dr Melvenia and hospital security also made aware. Tully, charge nurse made aware and TTS is supposed to be calling to evaluate pt at 0030 per last secure message, however no one has called yet. Secure message sent to [facility] making them aware of pt statement of wanting to leave.
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
## 2024-05-02 NOTE — Progress Notes (Signed)
 Pt has been accepted to [facility] on 05/02/2024 Bed assignment: 505-01  Pt meets inpatient criteria per: Jadeka Mangrum NP  Attending Physician will be: Leita Arts MD   Report can be called un:lwpu: Adult unit: [phone]  Pt can arrive after [facility] WILL UPDATE   Care Team Notified: [facility] Cherylynn Ernst RN, [facility] NP, [facility]ley [facility]  [place] Melia Hopes LCSW-A   05/02/2024 2:16 PM

## 2024-05-02 NOTE — Plan of Care (Signed)
   Problem: Activity: Goal: Sleeping patterns will improve Outcome: Progressing   Problem: Safety: Goal: Periods of time without injury will increase Outcome: Progressing

## 2024-05-02 NOTE — ED Notes (Addendum)
 Pt screaming out Thank you Jacquetta Sjogren! Repeatedly while walking around in her room. Shawntae, NT at bedside. Sitter outside room monitoring pt through glass doors.

## 2024-05-02 NOTE — ED Notes (Signed)
 Ptt w/ TTS. Calm, cooperative

## 2024-05-02 NOTE — ED Notes (Addendum)
 At this time there has been No response to secure chat message  that was sent to [facility]/TTS

## 2024-05-02 NOTE — ED Provider Notes (Addendum)
 Pt has been seen by bh team who recommends inpatient psych tx.   BH SW indicates accepted to [facility], Dr [place]ana.   Pt is alert, content, no distress, stable vitals. Pt appears stable for transfer/admission to [facility].      Bernard Drivers, MD 05/02/24 1815   Pt with episode agitated behavior - staff provided calming measures. Remains w agitated, aggressive type of behavior. Geodon  10 mg/ativan  1 mg given for symptom improvement.      Bernard Drivers, MD 05/02/24 [phone]

## 2024-05-02 NOTE — Group Note (Signed)
 Date:  05/02/2024 Time:  8:33 PM  Group Topic/Focus:  Wrap-Up Group:   The focus of this group is to help patients review their daily goal of treatment and discuss progress on daily workbooks.    Participation Level:  Did Not Attend  Lindsay [name] 05/02/2024, 8:33 PM

## 2024-05-02 NOTE — Progress Notes (Signed)
(  Sleep Hours) -6.5  (Any PRNs that were needed, meds refused, or side effects to meds)- Melatonin per [facility]  (Any disturbances and when (visitation, over night)- pt in her room yelling  in the name of Jesus over and over waking up other patients , pt asked numerous times to keep her voice downs as not to wake up other patients. Pt had to be told to quiet her voice numerous times, but would not lower her voice. IM's were drawn up , but pt appeared to quiet down and stopped disturbing her peers , will continue to monitor. IMs not given at this time.   (Concerns raised by the patient)- pt was reluctant to take her Lithium  , she refused to take it because she said it has metal in it , clinical research associate talked to pt and she decided to take it and was encouraged to discuss her issues with the doctor. Pt continues to be paranoid and hyper-religious , but pt is well know to writer from previous admissions.   (SI/HI/AVH)- denies

## 2024-05-02 NOTE — ED Notes (Signed)
 Pt crying loudly, sitting on side of bed

## 2024-05-02 NOTE — Progress Notes (Addendum)
 Patient ID: Lindsay [name], female   DOB: 1990-12-17, 33 y.o.   MRN: [id number] Admission Note:  33 yr female who presents IVC in no acute distress for the treatment of  Psychosis (Paranoia) , bizarre behavior and medication non-compliance. Pt denies SI / HI / AVH / pain at this time. Pt was vague about why she came to the hospital, she stated she felt off and needed to come and talk to someone. Pt remembered writer from prior admissions.   Per assessment: Patient is a 33 year old female with a history of Schizoaffective Disorder, Bipolar Type vs Bipolar Disorder (pt unclear) who presents to [facility] voluntarily for assessment. Per EDP note, Patient presents for psychiatric evaluation, states she has felt like she is being drugged and thinks people are coming to her house, has been off her Lamictal for about a week.  Schizoaffective disorder bipolar type.  Patient gives me very minimal history herself, refusing to speak with me but declines physical complaints, she did allow me to examine her and she is normal lung sounds, moves her extremities equally. Patient refused any workup, stating I want to use a holistic approach.  IVC taken out at this time so we can get medical clearance for psychiatric evaluation.  I spoke with patient's mother and cousin who are both listed as emergency contacts for collateral, they note that she has become hyperreligious, stopped taking her medicines which is what happens when she stops taking her medicines.  Patient had refused to engage in assessment with [facility] provider earlier.  Upon assessment with this clinician, patient is cooperative, alert and oriented x4, with limited orientation to situation. Patient initially stated she is here because her  memories are making me feel abnormal. Patient admits that she discontinued her Lithium , stating she has done some research and she is not comfortable with taking a medicine with metals in it.'  She did not inform her [facility] ACTT  team that she was discontinuing the medicine. She states she is open to starting Risperidone , LAI  however, she states she has to see the doctor with her ACTT team to discuss this further. Patient is engaging and pleasant, often laughing with this clinician. Petition allegations were discussed.  Patient shares she does believe people have come into her home, as she has noticed some things on the floor that weren't there.  She has also sensed spirits.  He then shares she actually heard a person come in my apartment and leave.  She states she stayed in her room with the door closed and when she heard the person leave, she checked on her place.  Patient is still somewhat hyper religious, at one point appearing to have mini convulsions, followed by stating thank you Jesus several times.  Upon discussion of this behavior, patient states, I feel your energy, your spirit. Patient states she is motivated to care for her mental health, especially as she is involved with DSS in regards to her 75 year old son. He stays with his father and she has DSS supervised visits once per week.  She states that if she is doing well, she is hoping for more visits and then eventually shared custody with her son's father. Patient denies other stressors.  She denies SI, HI and AVH.  Upon discussion of treatment options, patient states she really does not want to go back into a hospital at this time. She was recently admitted to [facility] from 9/11-9/22/2025.   She believes she can follow  up with ACTT provider to start the Resperidone and I'll be okay.    A: Skin was assessed (Aisha-RN) and found to be clear of any abnormal marks. PT searched and no contraband found, POC and unit policies explained and understanding verbalized. Consents obtained. Food and fluids offered, and refused. Pt given HS medications, pt initially refused her Lithium  but decided to take it later .   R:Pt had no additional questions or concerns.

## 2024-05-02 NOTE — BH Assessment (Addendum)
 Comprehensive Clinical Assessment (CCA) Note  05/02/2024 Lindsay [name] [id number]  Disposition: Per Cathaleen Adam, NP  inpatient treatment is recommended. [facility] has accepted for treatment.  The patient demonstrates the following risk factors for suicide: Chronic risk factors for suicide include: psychiatric disorder of Schizoaffective vs Bipolar. Acute risk factors for suicide include: family or marital conflict and loss (financial, interpersonal, professional). Protective factors for this patient include: positive social support, positive therapeutic relationship, coping skills, and hope for the future. Considering these factors, the overall suicide risk at this point appears to be low. Patient is appropriate for outpatient follow up, once stabilized.   Patient is a 33 year old female with a history of Schizoaffective Disorder, Bipolar Type vs Bipolar Disorder (pt unclear) who presents to [facility] voluntarily for assessment. Per EDP note, Patient presents for psychiatric evaluation, states she has felt like she is being drugged and thinks people are coming to her house, has been off her Lamictal for about a week.  Schizoaffective disorder bipolar type.  Patient gives me very minimal history herself, refusing to speak with me but declines physical complaints, she did allow me to examine her and she is normal lung sounds, moves her extremities equally. Patient refused any workup, stating I want to use a holistic approach.  IVC taken out at this time so we can get medical clearance for psychiatric evaluation.  I spoke with patient's mother and cousin who are both listed as emergency contacts for collateral, they note that she has become hyperreligious, stopped taking her medicines which is what happens when she stops taking her medicines.  Patient had refused to engage in assessment with [facility] provider earlier.  Upon assessment with this clinician, patient is cooperative, alert and oriented x4, with  limited orientation to situation. Patient initially stated she is here because her  memories are making me feel abnormal. Patient admits that she discontinued her Lithium , stating she has done some research and she is not comfortable with taking a medicine with metals in it.'  She did not inform her [facility] ACTT team that she was discontinuing the medicine. She states she is open to starting Risperidone , LAI  however, she states she has to see the doctor with her ACTT team to discuss this further. Patient is engaging and pleasant, often laughing with this clinician. Petition allegations were discussed.  Patient shares she does believe people have come into her home, as she has noticed some things on the floor that weren't there.  She has also sensed spirits.  He then shares she actually heard a person come in my apartment and leave.  She states she stayed in her room with the door closed and when she heard the person leave, she checked on her place.  Patient is still somewhat hyper religious, at one point appearing to have mini convulsions, followed by stating thank you Jesus several times.  Upon discussion of this behavior, patient states, I feel your energy, your spirit. Patient states she is motivated to care for her mental health, especially as she is involved with DSS in regards to her 53 year old son. He stays with his father and she has DSS supervised visits once per week.  She states that if she is doing well, she is hoping for more visits and then eventually shared custody with her son's father. Patient denies other stressors.  She denies SI, HI and AVH.  Upon discussion of treatment options, patient states she really does not want to go back into  a hospital at this time. She was recently admitted to [facility] from 9/11-9/22/2025.   She believes she can follow up with ACTT provider to start the Resperidone and I'll be okay.     Chief Complaint:  Chief Complaint  Patient presents with    V70.1   Visit Diagnosis: Bipolar vs Schizoaffective Disorder, bipolar type    CCA Screening, Triage and Referral (STR)  Patient Reported Information How did you hear about us ? Legal System  What Is the Reason for Your Visit/Call Today? Per ED triage, patient presented due to feelings of sadness and states I feel like I was drugged pt denies SI or HI.  Denies any street drugs or ETOH.  Pt states she stopped her medication Lithium  herself. Pt states she has been dx'd with bipolar.  How Long Has This Been Causing You Problems? 1-6 months  What Do You Feel Would Help You the Most Today? Treatment for Depression or other mood problem   Have You Recently Had Any Thoughts About Hurting Yourself? No  Are You Planning to Commit Suicide/Harm Yourself At This time? No   Flowsheet Row ED from 05/01/2024 in [facility] Department at [facility] ED from 03/01/2024 in [facility] ED from 02/29/2024 in [facility] Emergency Department at [facility]  C-SSRS RISK CATEGORY No Risk Error: Question 6 not populated No Risk    Have you Recently Had Thoughts About Hurting Someone [name]? No  Are You Planning to Harm Someone at This Time? No  Explanation: N/A   Have You Used Any Alcohol or Drugs in the Past 24 Hours? No  How Long Ago Did You Use Drugs or Alcohol? N/A What Did You Use and How Much? NA   Do You Currently Have a Therapist/Psychiatrist? Yes  Name of Therapist/Psychiatrist: Name of Therapist/Psychiatrist: ACTT services with [facility]   Have You Been Recently Discharged From Any Office Practice or Programs? Yes  Explanation of Discharge From Practice/Program: D/C from [facility] 03/12/24     CCA Screening Triage Referral Assessment Type of Contact: Tele-Assessment  Telemedicine Service Delivery: Telemedicine service delivery: This service was provided via telemedicine using a 2-way, interactive audio and video  technology  Is this Initial or Reassessment? Is this Initial or Reassessment?: Initial Assessment  Date Telepsych consult ordered in [facility]:  Date Telepsych consult ordered in [facility]: 05/02/24  Time Telepsych consult ordered in [facility]:  Time Telepsych consult ordered in [facility]: 1118  Location of Assessment: [facility]  Provider Location: [facility] Assessment Services   Collateral Involvement: None   Does Patient Have a Automotive Engineer Guardian? No  Legal Guardian Contact Information: N/A  Copy of Legal Guardianship Form: -- (N/A)  Legal Guardian Notified of Arrival: -- (N/A)  Legal Guardian Notified of Pending Discharge: -- (N/A)  If Minor and Not Living with Parent(s), Who has Custody? N/A  Is CPS involved or ever been involved? In the Past (DSS involved, supervising visits once per wk with 44 yo son)  Is APS involved or ever been involved? Never   Patient Determined To Be At Risk for Harm To Self or Others Based on Review of Patient Reported Information or Presenting Complaint? No  Method: -- (N/A, no HI)  Availability of Means: -- (N/A, no HI)  Intent: -- (N/A, no HI)  Notification Required: -- (N/A, no HI)  Additional Information for Danger to Others Potential: -- (N/A, no HI)  Additional Comments for Danger to Others Potential: N/A,  no HI  Are There Guns or Other Weapons in Your Home? No  Types of Guns/Weapons: N/A  Are These Weapons Safely Secured?                            -- (N/A)  Who Could Verify You Are Able To Have These Secured: N/A  Do You Have any Outstanding Charges, Pending Court Dates, Parole/Probation? Denies  Contacted To Inform of Risk of Harm To Self or Others: Family/Significant Other:    Does Patient Present under Involuntary Commitment? Yes (EDP initiated IVC)    [place] of Residence: [place]   Patient Currently Receiving the Following Services: ACTT Psychologist, Educational); Medication Management   Determination of Need:  Urgent (48 hours)   Options For Referral: Facility-Based Crisis; Intensive Outpatient Therapy; Inpatient Hospitalization; BH Urgent Care     CCA Biopsychosocial Patient Reported Schizophrenia/Schizoaffective Diagnosis in Past: Yes   Strengths: Patient advocates for herself, she seeks healthy treatment options, engaged in services with [facility] ACTT   Mental Health Symptoms Depression:  Change in energy/activity; Difficulty Concentrating; Hopelessness   Duration of Depressive symptoms: Duration of Depressive Symptoms: Greater than two weeks   Mania:  Change in energy/activity; Racing thoughts; Overconfidence; Increased Energy   Anxiety:   Worrying; Tension; Restlessness   Psychosis:  Grossly disorganized or catatonic behavior; Other negative symptoms   Duration of Psychotic symptoms: Duration of Psychotic Symptoms: Less than six months   Trauma:  None (none reported)   Obsessions:  None   Compulsions:  None   Inattention:  N/A   Hyperactivity/Impulsivity:  N/A   Oppositional/Defiant Behaviors:  N/A   Emotional Irregularity:  Mood lability   Other Mood/Personality Symptoms:  none    Mental Status Exam Appearance and self-care  Stature:  Average   Weight:  Obese   Clothing:  Neat/clean; Age-appropriate   Grooming:  Normal (vomited and did not want to clean her mouth afterwards)   Cosmetic use:  None   Posture/gait:  Bizarre; Normal (bending over table in front of her)   Motor activity:  Not Remarkable (clapping, singing)   Sensorium  Attention:  Normal; Persistent   Concentration:  Variable   Orientation:  Place; Person; Object   Recall/memory:  Defective in Recent (Pt was not able to answer many questions appropriately due to psychotic symptoms.)   Affect and Mood  Affect:  Labile   Mood:  Hypomania; Euthymic   Relating  Eye contact:  Fleeting   Facial expression:  Responsive; Tense   Attitude toward examiner:  Cooperative; Defensive;  Guarded; Irritable   Thought and Language  Speech flow: Loud; Clear and Coherent; Pressured   Thought content:  Suspicious; Delusions   Preoccupation:  Religion   Hallucinations:  None (Pt was not able to answer many questions appropriately due to psychotic symptoms. Pt does not appear to be responding to internal stimuli.)   Organization:  Disorganized   Company Secretary of Knowledge:  Fair   Intelligence:  Average (Pt was not able to answer many questions appropriately due to psychotic symptoms.)   Abstraction:  Normal   Judgement:  Fair   Reality Testing:  Distorted   Insight:  Gaps   Decision Making:  Confused; Vacilates   Social Functioning  Social Maturity:  Irresponsible (unable to access due to psychotic symptoms)   Social Judgement:  Naive (unable to access due to psychotic symptoms)   Stress  Stressors:  Work (Pt was  not able to answer many questions appropriately due to psychotic symptoms.)   Coping Ability:  Overwhelmed; Exhausted   Skill Deficits:  -- (unable to access due to psychotic symptoms)   Supports:  Family; Friends/Service system     Religion: Religion/Spirituality Are You A Religious Person?: Yes What is Your Religious Affiliation?: Christian How Might This Affect Treatment?: Pt is hyper religious  Leisure/Recreation: Leisure / Recreation Do You Have Hobbies?: No (unable to access due to psychotic symptoms)  Exercise/Diet: Exercise/Diet Do You Exercise?: No (unable to access due to psychotic symptoms) Have You Gained or Lost A Significant Amount of Weight in the Past Six Months?: No (unable to access due to psychotic symptoms) Do You Follow a Special Diet?: No (unable to access due to psychotic symptoms) Do You Have Any Trouble Sleeping?: Yes Explanation of Sleeping Difficulties: Pt reported insomnia and has had several ED visits for insomnia over past few mos.   CCA Employment/Education Employment/Work  Situation: Employment / Work Situation Employment Situation: Unemployed (per chart) Patient's Job has Been Impacted by Current Illness:  (N/A) Has Patient ever Been in the 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 son lives with his father primarily, and patient had DSS supervised weekly visits.  She states she hopes this changes to unsupervisied soon, with plan to share custody with child's father.  Childhood History:  Childhood History By whom was/is the patient raised?: Both parents (per chart) Did patient suffer any verbal/emotional/physical/sexual abuse as a child?: No (per chart) Did patient suffer from severe childhood neglect?: No Has patient ever been sexually abused/assaulted/raped as an adolescent or adult?: No (per chart) Was the patient ever a victim of a crime or a disaster?: No Witnessed domestic violence?: No (per chart) Has patient been affected by domestic violence as an adult?: No (per chart)       CCA Substance Use Alcohol/Drug Use: Alcohol / Drug Use Pain Medications: See MAR Prescriptions: See MAR Over the Counter: See MAR History of alcohol / drug use?: No history of alcohol / drug abuse Longest period of sobriety (when/how long): n/a                         ASAM's:  Six Dimensions of Multidimensional Assessment  Dimension 1:   Acute Intoxication and/or Withdrawal Potential:      Dimension 2:  Biomedical Conditions and Complications:      Dimension 3:  Emotional, Behavioral, or Cognitive Conditions and Complications:     Dimension 4:  Readiness to Change:     Dimension 5:  Relapse, Continued use, or Continued Problem Potential:     Dimension 6:  Recovery/Living Environment:     ASAM Severity Score:    ASAM Recommended Level of Treatment:     Substance use Disorder (SUD)    Recommendations for Services/Supports/Treatments:    Disposition Recommendation per psychiatric provider: We recommend inpatient psychiatric hospitalization when  medically cleared. Patient is under voluntary admission status at this time; please IVC if attempts to leave hospital.   DSM5 Diagnoses: Patient Active Problem List   Diagnosis Date Noted   Schizoaffective disorder, bipolar type (HCC) 05/14/2023   Schizophrenia, unspecified type (HCC) 05/13/2023   Hyperprolactinemia 02/01/2023   Prediabetes 02/01/2023   Mixed hyperlipidemia 02/01/2023   Morbid obesity (HCC) 02/01/2023   Acanthosis nigricans 02/01/2023   Schizophrenia spectrum disorder with psychotic disorder type not yet determined (HCC) 09/16/2020   Normal postpartum course 01/29/2019   S/P primary low transverse C-section 01/29/2019   Gestational hypertension 01/24/2019   Gestational hypertension, third trimester 01/24/2019   Transient hypertension 01/12/2019     Referrals to Alternative Service(s): Referred to Alternative Service(s):   Place:   Date:   Time:    Referred to Alternative Service(s):   Place:   Date:   Time:    Referred to Alternative Service(s):   Place:   Date:   Time:    Referred to Alternative Service(s):   Place:   Date:   Time:     Lindsay [name], [facility]

## 2024-05-02 NOTE — Progress Notes (Signed)
 At 2330, Pt began yelling and moaning loudly in her room about religious subjects. On approach, Pt did not respond to redirection from the Writer, instead yelling: I REBUKE YOU IN THE NAME OF J[employer]US CHRIST, YOU WILL BURN IN THE FIRES!  Pt asked by the Writer to lower her voice as her screaming has woken her peers on the hallway, some of whom are now frightened by it. Pt does not respond to any prompts from the Writer whatsoever. Yelling continues.

## 2024-05-02 NOTE — ED Notes (Signed)
 Pt received breakfast tray
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
## 2024-05-02 NOTE — ED Provider Notes (Signed)
 Emergency Medicine Observation Re-evaluation Note  Lindsay [name] is a 33 y.o. female, seen on rounds today.  Pt initially presented to the ED for complaints of V70.1 Currently, the patient is sleeping. Presented to ED yesterday for paranoia, h/o schizoaffective disorder, friend reported noncompliance w/ her lamictal. Received ativan  2 mg IM and benadryl  IM 12.5 mg at 0245 AM.  Physical Exam  BP (!) 162/71 (BP Location: Left Arm)   Pulse 96   Temp 97.6 F (36.4 C) (Oral)   Resp 18   Ht 5' 3 (1.6 m)   Wt (!) 156.5 kg   LMP  (LMP Unknown)   SpO2 100%   BMI 61.12 kg/m  Physical Exam General: resting, sleeping Cardiac: well-perfused Lungs: no resp distress Psych: sleeping  ED Course / MDM  EKG:   I have reviewed the labs performed to date as well as medications administered while in observation.  Recent changes in the last 24 hours include patient refused BH assessment, in-house team planning to see her this AM.  Plan  Current plan is for [facility] assessment.    Franklyn Sid SAILOR, MD 05/02/24 [phone]

## 2024-05-02 NOTE — ED Notes (Signed)
 Pt refusing to participate in TTS evaluation. Pt sitting on side of bed at this time

## 2024-05-02 NOTE — BH Assessment (Signed)
 Per Daphne, with [facility]: "Due to pt refusing, we will ask that the in-house team see the pt in the AM. If the pt should agree for us  to see them please reach back out and we will reschedule."     Jackson JONETTA Broach, MS, [facility], [facility] Triage Specialist [phone]

## 2024-05-02 NOTE — ED Notes (Signed)
 Pt sitting on bed reading Bible out loud at this time.

## 2024-05-02 NOTE — ED Notes (Addendum)
 [facility] called pts ACTT in [place] [phone]) for collateral information and to inquire about IM medication for pt. When [facility] called the phone automatically transferred to another number when they did not pick up. [facility] was advised to call the number again to see if they answered.   [facility] spoke with pts ACTT and informed them that pt is currently in the [facility] ED and may be recommended for psychiatric inpatient admission. [facility] spoke with an ACTT team member who then spoke with provider about about medications pt has been taking.  Chesley Holt, [facility]  05/02/24

## 2024-05-02 NOTE — ED Notes (Addendum)
 Dr Melvenia made aware. No new orders at this time.

## 2024-05-02 NOTE — ED Notes (Signed)
 TTS cart at pt bedside. Pt aware that TTS will be calling within next 15 minutes- sitter at bedside.

## 2024-05-02 NOTE — ED Notes (Signed)
 Pt belongings placed in locker 2. Lindsay [name] is locked.

## 2024-05-03 DIAGNOSIS — F250 Schizoaffective disorder, bipolar type: Secondary | ICD-10-CM

## 2024-05-03 MED ORDER — TRAZODONE HCL 50 MG PO TABS: 50.0000 mg | ORAL_TABLET | Freq: Every evening | ORAL | Status: DC | PRN

## 2024-05-03 MED ORDER — CLONIDINE HCL 0.1 MG PO TABS
0.1000 mg | ORAL_TABLET | Freq: Three times a day (TID) | ORAL | Status: DC | PRN
  Administered 2024-05-04 – 2024-05-11 (×3): 0.1 mg via ORAL
  Filled 2024-05-03 (×3): qty 1

## 2024-05-03 MED ORDER — VITAMIN D (ERGOCALCIFEROL) 1.25 MG (50000 UNIT) PO CAPS
50000.0000 [IU] | ORAL_CAPSULE | ORAL | Status: DC
  Administered 2024-05-03 – 2024-05-10 (×2): 50000 [IU] via ORAL
  Filled 2024-05-03 (×2): qty 1

## 2024-05-03 MED ORDER — LISINOPRIL 5 MG PO TABS
10.0000 mg | ORAL_TABLET | Freq: Two times a day (BID) | ORAL | Status: DC
  Administered 2024-05-04 – 2024-05-12 (×16): 10 mg via ORAL
  Filled 2024-05-03 (×18): qty 2

## 2024-05-03 MED ORDER — LITHIUM CARBONATE 300 MG PO CAPS
600.0000 mg | ORAL_CAPSULE | Freq: Two times a day (BID) | ORAL | Status: DC
  Administered 2024-05-04 – 2024-05-06 (×5): 600 mg via ORAL
  Filled 2024-05-03 (×5): qty 2

## 2024-05-03 NOTE — [facility] Suicide Risk Assessment (Signed)
 [facility] INPATIENT:  Family/Significant Other Suicide Prevention Education  Suicide Prevention Education:  Education Completed; Lindsay [name] (mother) [phone],  (name of family member/significant other) has been identified by the patient as the family member/significant other with whom the patient will be residing, and identified as the person(s) who will aid the patient in the event of a mental health crisis (suicidal ideations/suicide attempt).  With written consent from the patient, the family member/significant other has been provided the following suicide prevention education, prior to the and/or following the discharge of the patient.  Mom said that patient lives alone, and doesn't have access to guns or weapons.  Mom said this is patient's third hospitalization since July.  Mom said that patient doesn't take her medications, and this is reason for hospitalizations.  Mom said that patient may be untruthful about taking medications, and mom notices when 1 - 2 weeks passes from the time she stops taking medications, and has to be admitted to the hospital.  Mom said that patient's child is with child's father.  The suicide prevention education provided includes the following: Suicide risk factors Suicide prevention and interventions National Suicide Hotline telephone number [facility] assessment telephone number [facility] Emergency Assistance 911 [facility] and/or Residential Mobile Crisis Unit telephone number  Request made of family/significant other to: Remove weapons (e.g., guns, rifles, knives), all items previously/currently identified as safety concern.   Remove drugs/medications (over-the-counter, prescriptions, illicit drugs), all items previously/currently identified as a safety concern.  The family member/significant other verbalizes understanding of the suicide prevention education information provided.  The family member/significant other agrees to  remove the items of safety concern listed above.  Ko Bardon O Lindsay [name], LCSWA 05/03/2024, 4:33 PM

## 2024-05-03 NOTE — B[facility] Group Notes (Signed)
 Adult Psychoeducational Group Note  Date:  05/03/2024 Time:  11:22 AM  Group Topic/Focus: Nutrition Group Healthy Communication:   The focus of this group is to discuss communication, barriers to communication, as well as healthy ways to communicate with others.  Participation Level:  Did Not Attend  Participation Quality:    Affect:    Cognitive:    Insight:   Engagement in Group:    Modes of Intervention:    Additional Comments:    Noa Constante Lee 05/03/2024, 11:22 AM

## 2024-05-03 NOTE — Progress Notes (Signed)
 Recreation Therapy Notes  INPATIENT RECREATION THERAPY ASSESSMENT  Patient Details Name: Lindsay [name] MRN: [id number] DOB: [birth year] Today's Date: 05/03/2024       Information Obtained From: Patient  Able to Participate in Assessment/Interview: Yes  Patient Presentation: Alert  Reason for Admission (Per Patient): Other (Comments) (not sleeping)  Patient Stressors:  (none identified)  Coping Skills:   Journal, Sports, TV, Music, Exercise, Deep Breathing, Meditate, Talk, Prayer, Art, Avoidance, Dance, Read, Hot Bath/Shower  Leisure Interests (2+):  Social - Friends, Technical Brewer - Other (Comment), Music - Listen (Park)  Frequency of Recreation/Participation: Other (Comment) (Daily)  Awareness of Community Resources:  Yes  Community Resources:  Recreation Center, [employer], Other (Comment) (Stores)  Current Use: Yes  If no, Barriers?:    Expressed Interest in [employer] Information: No  [employer] of Residence:  [place]  Patient Main Form of Transportation: Set Designer  Patient Strengths:  Kind, Wise, Paramedic, Peace, Joy  Patient Identified Areas of Improvement:  trusting God more  Patient Goal for Hospitalization:  get out, get healing  Current SI (including self-harm):  No  Current HI:  No  Current AVH: No  Staff Intervention Plan: Group Attendance, Collaborate with Interdisciplinary Treatment Team  Consent to Intern Participation: N/A   Tommy Goostree-McCall, LRT,CTRS [place] A Marlee Trentman-McCall 05/03/2024, 1:27 PM
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
## 2024-05-03 NOTE — Progress Notes (Signed)
 Pt noted to be religiously preoccupied. Pt paranoid during medication pass, asking to see packaging of medication. Pt remains medication compliant. Pt gives inappropriate answers to questions this morning, often repeating I am not fearful God.
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
## 2024-05-03 NOTE — Group Note (Signed)
 Date:  05/03/2024 Time:  8:41 PM  Group Topic/Focus:  Wrap-Up Group:   The focus of this group is to help patients review their daily goal of treatment and discuss progress on daily workbooks.    Participation Level:  Did Not Attend  Tyvion Edmondson Dacosta 05/03/2024, 8:41 PM

## 2024-05-03 NOTE — BHH Group Notes (Signed)
 Adult Psychoeducational Group Note  Date:  05/03/2024 Time:  11:01 AM  Group Topic/Focus: Recreation Therapy Wellness Toolbox:   The focus of this group is to discuss various aspects of wellness, balancing those aspects and exploring ways to increase the ability to experience wellness.  Patients will create a wellness toolbox for use upon discharge.  Participation Level:  Did Not Attend  Participation Quality:    Affect:    Cognitive:   Insight:   Engagement in Group:    Modes of Intervention:    Additional Comments:    Lindsay [name] 05/03/2024, 11:01 AM

## 2024-05-03 NOTE — BHH Group Notes (Signed)
 Adult Psychoeducational Group Note  Date:  05/03/2024 Time:  1:27 PM  Group Topic/Focus: [employer] Recovery Goals:   The focus of this group is to identify appropriate goals for recovery and establish a plan to achieve them.  Participation Level:  Active  Participation Quality:  Appropriate  Affect:  Appropriate  Cognitive:  Appropriate  Insight: Appropriate  Engagement in Group:  Engaged  Modes of Intervention:  Discussion  Additional Comments:  Attended group[  Coltin Casher Lee 05/03/2024, 1:27 PM

## 2024-05-03 NOTE — BHH Group Notes (Signed)
 Adult Psychoeducational Group Note  Date:  05/03/2024 Time:  4:06 PM  Group Topic/Focus: Occupational Therapy Self Care:   The focus of this group is to help patients understand the importance of self-care in order to improve or restore emotional, physical, spiritual, interpersonal, and financial health.  Participation Level:  Did Not Attend  Participation Quality:    Affect:    Cognitive:    Insight:   Engagement in Group:    Modes of Intervention:    Additional Comments:    Lindsay [name] 05/03/2024, 4:06 PM

## 2024-05-03 NOTE — Group Note (Signed)
 Recreation Therapy Group Note   Group Topic:Healthy Decision Making  Group Date: 05/03/2024 Start Time: 1015 End Time: 1040 Facilitators: Kortney Schoenfelder-McCall, LRT,CTRS Location: [address] Dayroom   Group Topic: Decision Making, Problem Solving, Communication  Goal Area(s) Addresses:  Patient will effectively work with peer towards shared goal.  Patient will identify factors that guided their decision making.  Patient will pro-socially communicate ideas during group session.   Behavioral Response:   Intervention: Survival Scenario - pencil, paper  Activity: Patients were given a scenario that they were going to be stranded in a desert for several months before being rescued. Writer tasked them with making a list of 15 things they would choose to bring with them for survival. The list of items was prioritized most important to least. Each patient would come up with their own list, then work together to create a new list of 15 items while in a group of 3-5 peers. LRT discussed each person's list and how it differed from others. The debrief included discussion of priorities, good decisions versus bad decisions, and how it is important to think before acting so we can make the best decision possible. LRT tied the concept of effective communication among group members to patient's support systems outside of the hospital and its benefit post discharge.  Education: Pharmacist, Community, Priorities, Support System, Discharge Planning   Education Outcome: Acknowledges education/In group clarification/Needs additional education   Affect/Mood: N/A   Participation Level: Did not attend    Clinical Observations/Individualized Feedback:      Plan: Continue to engage patient in RT group sessions 2-3x/week.   Bradie Sangiovanni-McCall, LRT,CTRS  05/03/2024 12:35 PM

## 2024-05-03 NOTE — Progress Notes (Signed)
 At about 2330, this writer went down to the patient room with her nurse because she was yelling loudly stating I am not afraid.  Her nurse tried to redirect her to  lower her voice because other patients was sleeping and we did not want to wake them.  She continues to repeat herself over and over.

## 2024-05-03 NOTE — Progress Notes (Incomplete)
(  Sleep Hours) -  (Any PRNs that were needed, meds refused, or side effects to meds)- Melatonin 5 mg  (Any disturbances and when (visitation, over night)- none  (Concerns raised by the patient)- none, pt has been visible on the unit and appropriate this evening . Ask me 3 discussed with pt and verbal understanding stated. , pt stated she had a good day and felt better  (SI/HI/AVH)-denies

## 2024-05-03 NOTE — Plan of Care (Signed)
   Problem: Education: Goal: Emotional status will improve Outcome: Progressing Goal: Mental status will improve Outcome: Progressing   Problem: Activity: Goal: Interest or engagement in activities will improve Outcome: Progressing Goal: Sleeping patterns will improve Outcome: Progressing

## 2024-05-03 NOTE — BHH Counselor (Signed)
 Adult Comprehensive Assessment  Patient ID: Lindsay [name], female   DOB: Sep 15, 1990, 33 y.o.   MRN: [id number]  Information Source: Information source: Patient  Current Stressors:  Patient states their primary concerns and needs for treatment are:: to help me figure this out in a holistic way Patient states their goals for this hospitilization and ongoing recovery are:: get home to my son and family Educational / Learning stressors: denies Employment / Job issues: I want a job but I may apply for disability instead Family Relationships: good. My son loves me Surveyor, Quantity / Lack of resources (include bankruptcy): I don't have any income but my son's father helps. Housing / Lack of housing: I live in [place] but I want to move to [facility] Physical health (include injuries & life threatening diseases): reports that she was diagnosed with prediabetes and asthma but no longer holds those DX Social relationships: I have lots of friends Substance abuse: denies Bereavement / Loss: denies  Living/Environment/Situation:  Living Arrangements: Children Who else lives in the home?: My 5 yr old son How long has patient lived in current situation?: years What is atmosphere in current home: Comfortable  Family History:  Marital status: Single Are you sexually active?: Yes What is your sexual orientation?: heterosexual Does patient have children?: Yes How many children?: 1 How is patient's relationship with their children?: I have a good time with my son. He is with my family now  Childhood History:  By whom was/is the patient raised?: Both parents Additional childhood history information: Parents were together Description of patient's relationship with caregiver when they were a child: Patient had a good relationship with both parents growing up. 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?: No  Financial Resources:   Financial resources: No income  Alcohol/Substance Abuse:   What has been your use of drugs/alcohol within the last 12 months?: denies If attempted suicide, did drugs/alcohol play a role in this?: No  Social Support System:   Museum/gallery Exhibitions Officer System: denies Type of faith/religion: I believe in God How does patient's faith help to cope with current illness?: prayer and listen to [employer] music  Leisure/Recreation:   Do You Have Hobbies?: Yes Leisure and Hobbies: sing, talk to people, watch tv  Strengths/Needs:   What is the patient's perception of their strengths?: gospel music, singing and prayer are my strengths Patient states they can use these personal strengths during their treatment to contribute to their recovery: Yes, prayer and positive music Patient states these barriers may affect/interfere with their treatment: denies any barriers Patient states these barriers may affect their return to the community: I want to move to [facility]  Discharge Plan:   Currently receiving community mental health services: No Patient states concerns and preferences for aftercare planning are: I am not going  to a therapist and I will not take medication. I want to  go the holistic route. Patient states they will know when they are safe and ready for discharge when: I am ready to go now Does patient have access to transportation?: Yes Does patient have financial barriers related to discharge medications?: No Patient description of barriers related to discharge medications: I am not going to pick it up Will patient be returning to same living situation after discharge?: Yes  Summary/Recommendations:   Summary and Recommendations (to be completed by the evaluator): Bennye is a 33 yr old woman that was admitted into [facility] on 05/02/2024 due to bizarre behavior and hyper-religious conversations.  She also was admitted due to a disorganized presentation. She made multiple religious statements but was able to be redirected. She has a history of admissions. She is currently not connected to any therapist or providers for medication.  She stated that she will only take holistic medication. She has a 22 yr old son that is currently with her family. She denies any stressors. While here, Jocelynne can benefit from crisis stabilization, medication management, therapeutic milieu, and referrals for services.   Nat CHRISTELLA Salter. 05/03/2024

## 2024-05-03 NOTE — Plan of Care (Signed)
  Problem: Education: Goal: Knowledge of [facility] Education information/materials will improve Outcome: Progressing Goal: Emotional status will improve Outcome: Progressing Goal: Mental status will improve Outcome: Progressing Goal: Verbalization of understanding the information provided will improve Outcome: Progressing   Problem: Activity: Goal: Interest or engagement in activities will improve Outcome: Progressing Goal: Sleeping patterns will improve Outcome: Progressing   Problem: Coping: Goal: Ability to verbalize frustrations and anger appropriately will improve Outcome: Progressing Goal: Ability to demonstrate self-control will improve Outcome: Progressing   Problem: Health Behavior/Discharge Planning: Goal: Identification of resources available to assist in meeting health care needs will improve Outcome: Progressing Goal: Compliance with treatment plan for underlying cause of condition will improve Outcome: Progressing   Problem: Physical Regulation: Goal: Ability to maintain clinical measurements within normal limits will improve Outcome: Progressing   Problem: Safety: Goal: Periods of time without injury will increase Outcome: Progressing   Problem: Education: Goal: Knowledge of [place] Education information/materials will improve Outcome: Progressing Goal: Emotional status will improve Outcome: Progressing Goal: Mental status will improve Outcome: Progressing Goal: Verbalization of understanding the information provided will improve Outcome: Progressing   Problem: Activity: Goal: Interest or engagement in activities will improve Outcome: Progressing Goal: Sleeping patterns will improve Outcome: Progressing   Problem: Coping: Goal: Ability to verbalize frustrations and anger appropriately will improve Outcome: Progressing Goal: Ability to demonstrate self-control will improve Outcome: Progressing   Problem: Health  Behavior/Discharge Planning: Goal: Identification of resources available to assist in meeting health care needs will improve Outcome: Progressing Goal: Compliance with treatment plan for underlying cause of condition will improve Outcome: Progressing   Problem: Physical Regulation: Goal: Ability to maintain clinical measurements within normal limits will improve Outcome: Progressing   Problem: Safety: Goal: Periods of time without injury will increase Outcome: Progressing   Problem: Activity: Goal: Will identify at least one activity in which they can participate Outcome: Progressing   Problem: Coping: Goal: Ability to identify and develop effective coping behavior will improve Outcome: Progressing Goal: Ability to interact with others will improve Outcome: Progressing Goal: Demonstration of participation in decision-making regarding own care will improve Outcome: Progressing Goal: Ability to use eye contact when communicating with others will improve Outcome: Progressing   Problem: Health Behavior/Discharge Planning: Goal: Identification of resources available to assist in meeting health care needs will improve Outcome: Progressing   Problem: Self-Concept: Goal: Will verbalize positive feelings about self Outcome: Progressing   Problem: Activity: Goal: Will verbalize the importance of balancing activity with adequate rest periods Outcome: Progressing   Problem: Education: Goal: Will be free of psychotic symptoms Outcome: Progressing Goal: Knowledge of the prescribed therapeutic regimen will improve Outcome: Progressing   Problem: Coping: Goal: Coping ability will improve Outcome: Progressing Goal: Will verbalize feelings Outcome: Progressing   Problem: Health Behavior/Discharge Planning: Goal: Compliance with prescribed medication regimen will improve Outcome: Progressing   Problem: Nutritional: Goal: Ability to achieve adequate nutritional intake will  improve Outcome: Progressing   Problem: Role Relationship: Goal: Ability to communicate needs accurately will improve Outcome: Progressing Goal: Ability to interact with others will improve Outcome: Progressing   Problem: Safety: Goal: Ability to redirect hostility and anger into socially appropriate behaviors will improve Outcome: Progressing Goal: Ability to remain free from injury will improve Outcome: Progressing   Problem: Self-Care: Goal: Ability to participate in self-care as condition permits will improve Outcome: Progressing   Problem: Self-Concept: Goal: Will verbalize positive feelings about self Outcome: Progressing

## 2024-05-04 ENCOUNTER — Encounter (HOSPITAL_COMMUNITY): Payer: Self-pay

## 2024-05-04 MED ORDER — WHITE PETROLATUM EX OINT
TOPICAL_OINTMENT | CUTANEOUS | Status: AC
  Filled 2024-05-04: qty 5
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
## 2024-05-04 NOTE — [facility] Group Notes (Signed)
 Patient did not attend Spiritual Wellness Group by [facility].

## 2024-05-04 NOTE — Group Note (Signed)
 Date:  05/04/2024 Time:  12:50 PM  Group Topic/Focus: Physical Wellness Self Care:   The focus of this group is to help patients understand the importance of self-care in order to improve or restore emotional, physical, spiritual, interpersonal, and financial health.    Participation Level:  Active  Participation Quality:  Appropriate  Affect:  Appropriate  Cognitive:  Appropriate  Insight: Good  Engagement in Group:  Engaged  Modes of Intervention:  Activity  Additional Comments:  Patient attended and participated in Physical Wellness Group.  Lindsay [name] Charnelle Bergeman 05/04/2024, 12:50 PM

## 2024-05-04 NOTE — BH IP Treatment Plan (Signed)
 Interdisciplinary Treatment and Diagnostic Plan Update  05/04/2024 Time of Session: 11:15 AM Lindsay [name] MRN: [id number]  Principal Diagnosis: Schizoaffective disorder, bipolar type (HCC)  Secondary Diagnoses: Principal Problem:   Schizoaffective disorder, bipolar type (HCC)   Current Medications:  Current Facility-Administered Medications  Medication Dose Route Frequency Provider Last Rate Last Admin   acetaminophen  (TYLENOL ) tablet 650 mg  650 mg Oral Q6H PRN Motley-Mangrum, Jadeka A, PMHNP       alum & mag hydroxide-simeth (MAALOX/MYLANTA) 200-200-20 MG/5ML suspension 30 mL  30 mL Oral Q4H PRN Motley-Mangrum, Jadeka A, PMHNP       cloNIDine  (CATAPRES ) tablet 0.1 mg  0.1 mg Oral Q8H PRN Prentis Kitchens A, DO   0.1 mg at 05/04/24 9381   haloperidol  (HALDOL ) tablet 5 mg  5 mg Oral TID PRN Motley-Mangrum, Jadeka A, PMHNP       And   diphenhydrAMINE  (BENADRYL ) capsule 50 mg  50 mg Oral TID PRN Motley-Mangrum, Jadeka A, PMHNP       haloperidol  lactate (HALDOL ) injection 5 mg  5 mg Intramuscular TID PRN Motley-Mangrum, Jadeka A, PMHNP       And   diphenhydrAMINE  (BENADRYL ) injection 50 mg  50 mg Intramuscular TID PRN Motley-Mangrum, Jadeka A, PMHNP       And   LORazepam  (ATIVAN ) injection 2 mg  2 mg Intramuscular TID PRN Motley-Mangrum, Jadeka A, PMHNP       haloperidol  lactate (HALDOL ) injection 10 mg  10 mg Intramuscular TID PRN Motley-Mangrum, Jadeka A, PMHNP       And   diphenhydrAMINE  (BENADRYL ) injection 50 mg  50 mg Intramuscular TID PRN Motley-Mangrum, Jadeka A, PMHNP       And   LORazepam  (ATIVAN ) injection 2 mg  2 mg Intramuscular TID PRN Motley-Mangrum, Jadeka A, PMHNP       lisinopril (ZESTRIL) tablet 10 mg  10 mg Oral BID Ntuen, Tina C, FNP   10 mg at 05/04/24 1041   lithium  carbonate capsule 600 mg  600 mg Oral BID Ntuen, Tina C, FNP   600 mg at 05/04/24 1040   magnesium  hydroxide (MILK OF MAGNESIA) suspension 30 mL  30 mL Oral Daily PRN Motley-Mangrum, Jadeka A, PMHNP        melatonin tablet 5 mg  5 mg Oral QHS PRN Motley-Mangrum, Jadeka A, PMHNP   5 mg at 05/03/24 2058   risperiDONE  (RISPERDAL ) tablet 1 mg  1 mg Oral BID Motley-Mangrum, Jadeka A, PMHNP   1 mg at 05/04/24 9141   Vitamin D (Ergocalciferol) (DRISDOL) 1.25 MG (50000 UNIT) capsule 50,000 Units  50,000 Units Oral Weekly Ntuen, Tina C, FNP   50,000 Units at 05/03/24 1801   PTA Medications: Medications Prior to Admission  Medication Sig Dispense Refill Last Dose/Taking   calcium carbonate (TUMS - DOSED IN MG ELEMENTAL CALCIUM) 500 MG chewable tablet Chew 1 tablet by mouth daily as needed for indigestion or heartburn.      lisinopril (ZESTRIL) 10 MG tablet Take 10 mg by mouth 2 (two) times daily.      lithium  300 MG tablet Take 300 mg by mouth 2 (two) times daily. (Patient not taking: Reported on 05/01/2024)      melatonin 5 MG TABS Take 5 mg by mouth at bedtime as needed (sleep).      MOUNJARO 2.5 MG/0.5ML Pen Inject 2.5 mg into the skin once a week. (Patient not taking: Reported on 05/01/2024)      risperiDONE  (RISPERDAL ) 1 MG tablet Take 1 mg by  mouth 2 (two) times daily.      Vitamin D, Ergocalciferol, (DRISDOL) 1.25 MG (50000 UNIT) CAPS capsule Take 50,000 Units by mouth every 3 (three) months.       Patient Stressors: Marital or family conflict   Medication change or noncompliance    Patient Strengths: Automotive Engineer for treatment/growth  Supportive family/friends   Treatment Modalities: Medication Management, Group therapy, Case management,  1 to 1 session with clinician, Psychoeducation, Recreational therapy.   Physician Treatment Plan for Primary Diagnosis: Schizoaffective disorder, bipolar type (HCC) Long Term Goal(s): Improvement in symptoms so as ready for discharge   Short Term Goals: Ability to identify changes in lifestyle to reduce recurrence of condition will improve Ability to verbalize feelings will improve Ability to disclose and discuss suicidal  ideas Ability to demonstrate self-control will improve Ability to identify and develop effective coping behaviors will improve Ability to maintain clinical measurements within normal limits will improve Compliance with prescribed medications will improve Ability to identify triggers associated with substance abuse/mental health issues will improve  Medication Management: Evaluate patient's response, side effects, and tolerance of medication regimen.  Therapeutic Interventions: 1 to 1 sessions, Unit Group sessions and Medication administration.  Evaluation of Outcomes: Not Progressing  Physician Treatment Plan for Secondary Diagnosis: Principal Problem:   Schizoaffective disorder, bipolar type (HCC)  Long Term Goal(s): Improvement in symptoms so as ready for discharge   Short Term Goals: Ability to identify changes in lifestyle to reduce recurrence of condition will improve Ability to verbalize feelings will improve Ability to disclose and discuss suicidal ideas Ability to demonstrate self-control will improve Ability to identify and develop effective coping behaviors will improve Ability to maintain clinical measurements within normal limits will improve Compliance with prescribed medications will improve Ability to identify triggers associated with substance abuse/mental health issues will improve     Medication Management: Evaluate patient's response, side effects, and tolerance of medication regimen.  Therapeutic Interventions: 1 to 1 sessions, Unit Group sessions and Medication administration.  Evaluation of Outcomes: Not Progressing   RN Treatment Plan for Primary Diagnosis: Schizoaffective disorder, bipolar type (HCC) Long Term Goal(s): Knowledge of disease and therapeutic regimen to maintain health will improve  Short Term Goals: Ability to remain free from injury will improve, Ability to verbalize frustration and anger appropriately will improve, Ability to demonstrate  self-control, Ability to participate in decision making will improve, Ability to verbalize feelings will improve, Ability to disclose and discuss suicidal ideas, Ability to identify and develop effective coping behaviors will improve, and Compliance with prescribed medications will improve  Medication Management: RN will administer medications as ordered by provider, will assess and evaluate patient's response and provide education to patient for prescribed medication. RN will report any adverse and/or side effects to prescribing provider.  Therapeutic Interventions: 1 on 1 counseling sessions, Psychoeducation, Medication administration, Evaluate responses to treatment, Monitor vital signs and CBGs as ordered, Perform/monitor CIWA, COWS, AIMS and Fall Risk screenings as ordered, Perform wound care treatments as ordered.  Evaluation of Outcomes: Not Progressing   LCSW Treatment Plan for Primary Diagnosis: Schizoaffective disorder, bipolar type (HCC) Long Term Goal(s): Safe transition to appropriate next level of care at discharge, Engage patient in therapeutic group addressing interpersonal concerns.  Short Term Goals: Engage patient in aftercare planning with referrals and resources, Increase social support, Increase ability to appropriately verbalize feelings, Increase emotional regulation, Facilitate acceptance of mental health diagnosis and concerns, Facilitate patient progression through stages of change regarding substance  use diagnoses and concerns, Identify triggers associated with mental health/substance abuse issues, and Increase skills for wellness and recovery  Therapeutic Interventions: Assess for all discharge needs, 1 to 1 time with Social worker, Explore available resources and support systems, Assess for adequacy in community support network, Educate family and significant other(s) on suicide prevention, Complete Psychosocial Assessment, Interpersonal group therapy.  Evaluation of  Outcomes: Not Progressing   Progress in Treatment: Attending groups: attended some groups Participating in groups:  Yes Taking medication as prescribed: Yes. Toleration medication: Yes. Family/Significant other contact made: Yes, individual(s) contacted:  Vertie Raymond (mother) [phone] Patient understands diagnosis: Yes. Discussing patient identified problems/goals with staff: No. Medical problems stabilized or resolved: No. Denies suicidal/homicidal ideation: Yes. Issues/concerns per patient self-inventory: No.  New problem(s) identified:  No  New Short Term/Long Term Goal(s):    medication stabilization, elimination of SI thoughts, development of comprehensive mental wellness plan.    Patient Goals:  I want to heal from my diagnosis and the medications they gave me.    Discharge Plan or Barriers:  Patient recently admitted. CSW will continue to follow and assess for appropriate referrals and possible discharge planning.    Reason for Continuation of Hospitalization: Hallucinations Medication stabilization  Estimated Length of Stay:  5 - 7 days  Last 3 [place] Suicide Severity Risk Score: Flowsheet Row Ad[facility] (Current) [facility] from [facility]01/2024 in [facility] at [facility] ED from 03/01/2024 in [facility]  C-SSRS RISK CATEGORY No Risk No Risk Error: Question 6 not populated    Last [facility] 2/9 Scores:     No data to display          Scribe for Treatment Team: Keana Dueitt O Bambi Fehnel, LCSWA 05/04/2024 6:32 PM

## 2024-05-04 NOTE — Progress Notes (Signed)
   05/04/24 0609  Vital Signs  Temp 98.2 F (36.8 C)  Temp Source Oral  Pulse Rate 92  Pulse Rate Source Monitor  BP (!) 152/101 (RN Micheal notified)  BP Location Left Arm  BP Method Automatic  Patient Position (if appropriate) Sitting  Oxygen Therapy  SpO2 99 %   Pt given PRN Clonidine  per [facility] , will re-check

## 2024-05-04 NOTE — Group Note (Signed)
 Date:  05/04/2024 Time:  8:22 PM  Group Topic/Focus:  Wrap-Up Group:   The focus of this group is to help patients review their daily goal of treatment and discuss progress on daily workbooks.    Participation Level:  Did Not Attend  Heyli Min Dacosta 05/04/2024, 8:22 PM

## 2024-05-04 NOTE — Group Note (Signed)
 Date:  05/04/2024 Time:  9:23 AM  Group Topic/Focus:  Goals Group:   The focus of this group is to help patients establish daily goals to achieve during treatment and discuss how the patient can incorporate goal setting into their daily lives to aide in recovery.    Participation Level:  Active  Participation Quality:  Appropriate  Affect:  Appropriate  Cognitive:  Appropriate  Insight: Good  Engagement in Group:  Engaged  Modes of Intervention:  Discussion  Additional Comments:  Patient participated and attended Goals Group.  Lindsay [name] BIRCH Jackee Glasner 05/04/2024, 9:23 AM

## 2024-05-04 NOTE — Plan of Care (Signed)
   Problem: Education: Goal: Emotional status will improve Outcome: Progressing Goal: Mental status will improve Outcome: Progressing   Problem: Activity: Goal: Sleeping patterns will improve Outcome: Progressing   Problem: Safety: Goal: Periods of time without injury will increase Outcome: Progressing

## 2024-05-04 NOTE — Group Note (Signed)
 Recreation Therapy Group Note   Group Topic:Problem Solving  Group Date: 05/04/2024 Start Time: 1040 End Time: 1100 Facilitators: Linzy Darling-McCall, LRT,CTRS Location: 500 [address] Dayroom   Group Topic: Problem Solving  Goal Area(s) Addresses:  Patient will verbalize importance of using appropriate problem solving techniques.  Patient will identify positive change associated with effective problem solving skills.   Behavioral Response: Engaged  Intervention: Worksheet, Pencils  Activity: [employer] Patients were given a worksheet with 24 words relevant to fall scrambled. Patients were to unscramble the letters to identify the word.    Education Outcome: Acknowledges understanding/In group clarification offered/Needs additional education.    Affect/Mood: Appropriate and Tearful   Participation Level: Engaged   Participation Quality: Independent   Behavior: Appropriate   Speech/Thought Process: Focused   Insight: Good   Judgement: Good   Modes of Intervention: Problem-solving   Patient Response to Interventions:  Engaged   Education Outcome:  In group clarification offered    Clinical Observations/Individualized Feedback: Pt was engaged and attentive group. Pt also became tearful as music played in group. Pt was comforted by peer and eventually brightened up. Pt was called out of group and didn't return.      Plan: Continue to engage patient in RT group sessions 2-3x/week.   Matilynn Dacey-McCall, LRT,CTRS 05/04/2024 1:23 PM

## 2024-05-04 NOTE — Plan of Care (Signed)
  Problem: Education: Goal: Knowledge of [facility] Education information/materials will improve Outcome: Progressing Goal: Emotional status will improve Outcome: Progressing Goal: Mental status will improve Outcome: Progressing Goal: Verbalization of understanding the information provided will improve Outcome: Progressing   Problem: Activity: Goal: Interest or engagement in activities will improve Outcome: Progressing Goal: Sleeping patterns will improve Outcome: Progressing   Problem: Coping: Goal: Ability to verbalize frustrations and anger appropriately will improve Outcome: Progressing Goal: Ability to demonstrate self-control will improve Outcome: Progressing   Problem: Health Behavior/Discharge Planning: Goal: Identification of resources available to assist in meeting health care needs will improve Outcome: Progressing Goal: Compliance with treatment plan for underlying cause of condition will improve Outcome: Progressing   Problem: Physical Regulation: Goal: Ability to maintain clinical measurements within normal limits will improve Outcome: Progressing   Problem: Safety: Goal: Periods of time without injury will increase Outcome: Progressing   Problem: Education: Goal: Knowledge of [place] Education information/materials will improve Outcome: Progressing Goal: Emotional status will improve Outcome: Progressing Goal: Mental status will improve Outcome: Progressing Goal: Verbalization of understanding the information provided will improve Outcome: Progressing   Problem: Activity: Goal: Interest or engagement in activities will improve Outcome: Progressing Goal: Sleeping patterns will improve Outcome: Progressing   Problem: Coping: Goal: Ability to verbalize frustrations and anger appropriately will improve Outcome: Progressing Goal: Ability to demonstrate self-control will improve Outcome: Progressing   Problem: Health  Behavior/Discharge Planning: Goal: Identification of resources available to assist in meeting health care needs will improve Outcome: Progressing Goal: Compliance with treatment plan for underlying cause of condition will improve Outcome: Progressing   Problem: Physical Regulation: Goal: Ability to maintain clinical measurements within normal limits will improve Outcome: Progressing   Problem: Safety: Goal: Periods of time without injury will increase Outcome: Progressing   Problem: Activity: Goal: Will identify at least one activity in which they can participate Outcome: Progressing   Problem: Coping: Goal: Ability to identify and develop effective coping behavior will improve Outcome: Progressing Goal: Ability to interact with others will improve Outcome: Progressing Goal: Demonstration of participation in decision-making regarding own care will improve Outcome: Progressing Goal: Ability to use eye contact when communicating with others will improve Outcome: Progressing   Problem: Health Behavior/Discharge Planning: Goal: Identification of resources available to assist in meeting health care needs will improve Outcome: Progressing   Problem: Self-Concept: Goal: Will verbalize positive feelings about self Outcome: Progressing   Problem: Activity: Goal: Will verbalize the importance of balancing activity with adequate rest periods Outcome: Progressing   Problem: Education: Goal: Will be free of psychotic symptoms Outcome: Progressing Goal: Knowledge of the prescribed therapeutic regimen will improve Outcome: Progressing   Problem: Coping: Goal: Coping ability will improve Outcome: Progressing Goal: Will verbalize feelings Outcome: Progressing   Problem: Health Behavior/Discharge Planning: Goal: Compliance with prescribed medication regimen will improve Outcome: Progressing   Problem: Nutritional: Goal: Ability to achieve adequate nutritional intake will  improve Outcome: Progressing   Problem: Role Relationship: Goal: Ability to communicate needs accurately will improve Outcome: Progressing Goal: Ability to interact with others will improve Outcome: Progressing   Problem: Safety: Goal: Ability to redirect hostility and anger into socially appropriate behaviors will improve Outcome: Progressing Goal: Ability to remain free from injury will improve Outcome: Progressing   Problem: Self-Care: Goal: Ability to participate in self-care as condition permits will improve Outcome: Progressing   Problem: Self-Concept: Goal: Will verbalize positive feelings about self Outcome: Progressing

## 2024-05-04 NOTE — Progress Notes (Signed)
 Pt refusing lithium  and lisinopril this morning. Pt states the blood pressure documented is incorrect and that her blood pressure is fine. Pt stated nurse could recheck her blood pressure however when nurse attempted to place blood pressure cuff pt became increasingly paranoid and would not allow blood pressure check. Pt also requested information about medication, educational printouts given per pt request. Pt did continue to refuse meds for writer however allowed a second nurse to administer her medications. Pt labile, noted crying in hallway and then laughing shortly after.

## 2024-05-04 NOTE — Progress Notes (Signed)
(  Sleep Hours) -7.5  (Any PRNs that were needed, meds refused, or side effects to meds)- none  (Any disturbances and when (visitation, over night)- none  (Concerns raised by the patient)- none , pt stated she had good day , Ask me 3 discussed with pt and verbal understanding stated.    (SI/HI/AVH)- AH

## 2024-05-04 NOTE — [facility] Group Notes (Signed)
 Patient attended Recreational Therapy Group but then got pulled out by Doctor.

## 2024-05-05 DIAGNOSIS — I10 Essential (primary) hypertension: Secondary | ICD-10-CM

## 2024-05-05 NOTE — Progress Notes (Signed)
Patient refused vital sign check

## 2024-05-05 NOTE — Progress Notes (Signed)
 [facility]patient Psychiatry Progress Note  Date: 05/05/24 Patient: Lindsay [name] MRN: [id number]  Assessment and Plan: Lindsay [name] is a 33 y.o. female with a history of schizoaffective disorder, bipolar type, who was admitted involuntarily for decompensated psychosis.    # Schizoaffective disorder, bipolar type (HCC) - risperidone  1 mg BID - Lithium  600 mg BID - Li level tomorrow morning  # HTN - Lisinopril 10 mg BID   Risk Assessment - Intermediate  Discharge Planning Barriers to discharge: psychosis Estimated length of stay: 4-7 days Predicted Discharge location: Home     Interval History and update: Chart reviewed, no significant events overnight. Patient has been med compliant. No medication side effects reported. Staff report that patient has been pleasant but disinterested to aloof. No bizarre behaviors exhibited. On assessment patient reports feeling good and denies any significant complaints.       Physical Exam MSK/Neuro - Normal gait and station  Mental Status Exam Appearance - Casually dressed, appropriate hygiene and grooming  Attitude - Calm, polite, aloof Speech - normal volume, prosody, inflection Mood - Good Affect - Restricted and pleasant Thought Process - GD, linear Thought Content - No delusions were spontaneously expressed SI/HI - Denies  Perceptions - Denies AVH; not RIS Judgement/Insight - Limited Fund of knowledge - WNL Language - No impairments      Lab Results:  No visits with results within 1 Day(s) from this visit.  Latest known visit with results is:  Admission on 05/01/2024, Discharged on 05/02/2024  Component Date Value Ref Range Status   Sodium 05/01/2024 139  135 - 145 mmol/L Final   Potassium 05/01/2024 3.5  3.5 - 5.1 mmol/L Final   Chloride 05/01/2024 100  98 - 111 mmol/L Final   CO2 05/01/2024 20 (L)  22 - 32 mmol/L Final   Glucose, Bld 05/01/2024 90  70 - 99 mg/dL Final   BUN 88/88/7974  6  6 - 20 mg/dL Final   Creatinine, Ser 05/01/2024 0.86  0.44 - 1.00 mg/dL Final   Calcium 88/88/7974 10.1  8.9 - 10.3 mg/dL Final   Total Protein 88/88/7974 8.1  6.5 - 8.1 g/dL Final   Albumin 88/88/7974 4.7  3.5 - 5.0 g/dL Final   AST 88/88/7974 19  15 - 41 U/L Final   ALT 05/01/2024 14  0 - 44 U/L Final   Alkaline Phosphatase 05/01/2024 74  38 - 126 U/L Final   Total Bilirubin 05/01/2024 1.0  0.0 - 1.2 mg/dL Final   GFR, Estimated 05/01/2024 >60  >60 mL/min Final   Anion gap 05/01/2024 19 (H)  5 - 15 Final   Alcohol, Ethyl (B) 05/01/2024 <15  <15 mg/dL Final   Opiates 88/88/7974 NEGATIVE  NEGATIVE Final   Cocaine 05/01/2024 NEGATIVE  NEGATIVE Final   Benzodiazepines 05/01/2024 NEGATIVE  NEGATIVE Final   Amphetamines 05/01/2024 NEGATIVE  NEGATIVE Final   Tetrahydrocannabinol 05/01/2024 NEGATIVE  NEGATIVE Final   Barbiturates 05/01/2024 NEGATIVE  NEGATIVE Final   Methadone Scn, Ur 05/01/2024 NEGATIVE  NEGATIVE Final   Fentanyl  05/01/2024 NEGATIVE  NEGATIVE Final   WBC 05/01/2024 8.1  4.0 - 10.5 K/uL Final   RBC 05/01/2024 6.23 (H)  3.87 - 5.11 MIL/uL Final   Hemoglobin 05/01/2024 13.4  12.0 - 15.0 g/dL Final   HCT 88/88/7974 43.8  36.0 - 46.0 % Final   MCV 05/01/2024 70.3 (L)  80.0 - 100.0 fL Final   MCH 05/01/2024 21.5 (L)  26.0 - 34.0 pg Final  MCHC 05/01/2024 30.6  30.0 - 36.0 g/dL Final   RDW 88/88/7974 15.0  11.5 - 15.5 % Final   Platelets 05/01/2024 310  150 - 400 K/uL Final   nRBC 05/01/2024 0.0  0.0 - 0.2 % Final   Neutrophils Relative % 05/01/2024 73  % Final   Neutro Abs 05/01/2024 5.9  1.7 - 7.7 K/uL Final   Lymphocytes Relative 05/01/2024 18  % Final   Lymphs Abs 05/01/2024 1.4  0.7 - 4.0 K/uL Final   Monocytes Relative 05/01/2024 7  % Final   Monocytes Absolute 05/01/2024 0.6  0.1 - 1.0 K/uL Final   Eosinophils Relative 05/01/2024 2  % Final   Eosinophils Absolute 05/01/2024 0.1  0.0 - 0.5 K/uL Final   Basophils Relative 05/01/2024 0  % Final   Basophils Absolute  05/01/2024 0.0  0.0 - 0.1 K/uL Final   Immature Granulocytes 05/01/2024 0  % Final   Abs Immature Granulocytes 05/01/2024 0.02  0.00 - 0.07 K/uL Final   Lithium  Lvl 05/01/2024 <0.10 (L)  0.60 - 1.20 mmol/L Final   Salicylate Lvl 05/01/2024 <7.0 (L)  7.0 - 30.0 mg/dL Final   Lactic Acid, Venous 05/02/2024 0.8  0.5 - 1.9 mmol/L Final     Vitals: Blood pressure (!) 140/69, pulse 91, temperature 98.2 F (36.8 C), temperature source Oral, resp. rate 18, height 5' 3 (1.6 m), weight (!) 144.2 kg, SpO2 99%.    Oliva DELENA Salmon, DO

## 2024-05-05 NOTE — Progress Notes (Signed)
(  Sleep Hours) -5   (Any PRNs that were needed, meds refused, or side effects to meds)- Melatonin 5 mg   (Any disturbances and when (visitation, over night)-none  (Concerns raised by the patient)- none , pt stated she was doing better , Ask me 3 discussed with pt and verbal understanding stated.    (SI/HI/AVH)- denies

## 2024-05-05 NOTE — Plan of Care (Signed)
   Problem: Education: Goal: Emotional status will improve Outcome: Progressing Goal: Mental status will improve Outcome: Progressing   Problem: Activity: Goal: Interest or engagement in activities will improve Outcome: Progressing Goal: Sleeping patterns will improve Outcome: Progressing   Problem: Safety: Goal: Periods of time without injury will increase Outcome: Progressing

## 2024-05-05 NOTE — Group Note (Signed)
 [facility] LCSW Group Therapy Note   Group Date: 05/05/2024 Start Time: 1010 End Time: 1110   Type of Therapy/Topic:  Group Therapy:  Balance in Holiday Representative and Flexibility   Participation Level:  Active   Description of Group:    This group will address the concept of balance and how it feels and looks when one is unbalanced. Patients will be encouraged to process areas in their lives that are out of balance, and identify reasons for remaining unbalanced. Facilitators will guide patients utilizing problem- solving interventions to address and correct the stressor making their life unbalanced. Understanding and applying boundaries will be explored and addressed for obtaining  and maintaining a balanced life. Patients will be encouraged to explore ways to assertively make their unbalanced needs known to significant others in their lives, using other group members and facilitator for support and feedback.  Therapeutic Goals: Patient will identify two or more emotions or situations they have that consume much of in their lives. Patient will identify signs/triggers that life has become out of balance:  Patient will identify two ways to set boundaries in order to achieve balance in their lives:  Patient will demonstrate ability to communicate their needs through discussion and/or role plays  Summary of Patient Progress: Lindsay [name] acknowledged priorities of parenthood with ability to be flexible to areas best for her son's life. Patient also shared how time for herself is important with keeping balanced.   Therapeutic Modalities:   Cognitive Behavioral Therapy Solution-Focused Therapy Assertiveness Training   [place], LCSWA

## 2024-05-05 NOTE — Progress Notes (Signed)
 Tour of Duty:  Prentice JINNY Angle, RN, 05/05/24, Tour of Duty: 0700-1900  SI/HI/AVH: Denies  Self-Reported   Mood: Negative  Anxiety: Denies Depression: Denies Irritability: Denies, but Observable  Broset  Violence Prevention Guidelines *See Row Information*: Moderate Violence Risk interventions implemented   LBM  Last BM Date : 05/04/24   Pain: not present  Patient Refusals (including Rx): Yes, including scheduled anti-hypertensive  >>Shift Summary: Patient observed to be mildly irritable on unit. Patient able to make needs known. Patient observed to engage appropriately with staff and peers. Patient taking medications as prescribed except for evening lisinopril. This shift, no PRN medication requested or required. No reported or observed side effects to medication. Patient observed to be verbose, with delusions of religiosity, and paranoid while in room. No reported or observed agitation, aggression, or other acute emotional distress. No reported or observed physical abnormalities or concerns.  Last Vitals  Vitals Weight: (!) 144.2 kg Temp: 98.2 F (36.8 C) Temp Source: Oral Pulse Rate: 97 Resp: 18 BP: 126/89 Patient Position: (not recorded)  Admission Type  Psych Admission Type (Psych Patients Only) Admission Status: Involuntary Date 72 hour document signed : (not recorded) Time 72 hour document signed : (not recorded) Provider Notified (First and Last Name) (see details for LINK to note): (not recorded)   Psychosocial Assessment  Psychosocial Assessment Patient Complaints: None Eye Contact: Fair Facial Expression: Other (Comment) (WDL) Affect: Preoccupied Speech: Argumentative Interaction: Assertive Motor Activity: Slow, Shuffling Appearance/Hygiene: Disheveled Behavior Characteristics: Cooperative Mood: Preoccupied   Aggressive Behavior  Targets: (not recorded)   Thought Process  Thought Process Coherency: Circumstantial Content:  Preoccupation Delusions: Paranoid, Religious Perception: Within Defined Limits Hallucination: Auditory Judgment: Limited Confusion: None  Danger to Self/Others  Danger to Self Current suicidal ideation?: Denies Description of Suicide Plan: (not recorded) Self-Injurious Behavior: (not recorded) Agreement Not to Harm Self: (not recorded) Description of Agreement: (not recorded) Danger to Others: None reported or observed

## 2024-05-05 NOTE — Group Note (Signed)
 Date:  05/05/2024 Time:  8:22 PM  Group Topic/Focus:  Wrap-Up Group:   The focus of this group is to help patients review their daily goal of treatment and discuss progress on daily workbooks.    Participation Level:  Active  Participation Quality:  Appropriate  Affect:  Appropriate  Cognitive:  Appropriate  Insight: Appropriate  Engagement in Group:  Engaged  Modes of Intervention:  Education and Exploration  Additional Comments:  Patient attended and participated in group tonight.  She reports that her goal for today was to get healing by doing things that are positive.  Gwenn Chillington Dacosta 05/05/2024, 8:22 PM

## 2024-05-05 NOTE — Group Note (Deleted)
 Date:  05/05/2024 Time:  8:35 AM  Group Topic/Focus: Social wellness and orientation goals Group Goals Group:   The focus of this group is to help patients establish daily goals to achieve during treatment and discuss how the patient can incorporate goal setting into their daily lives to aide in recovery. Orientation:   The focus of this group is to educate the patient on the purpose and policies of crisis stabilization and provide a format to answer questions about their admission.  The group details unit policies and expectations of patients while admitted.     Participation Level:  {BHH PARTICIPATION OZCZO:77735}  Participation Quality:  {BHH PARTICIPATION QUALITY:22265}  Affect:  {BHH AFFECT:22266}  Cognitive:  {BHH COGNITIVE:22267}  Insight: {BHH Insight2:20797}  Engagement in Group:  {BHH ENGAGEMENT IN HMNLE:77731}  Modes of Intervention:  {BHH MODES OF INTERVENTION:22269}  Additional Comments:  ***  Lindsay [name] Lindsay [name] 05/05/2024, 8:35 AM

## 2024-05-05 NOTE — Plan of Care (Signed)
   Problem: Education: Goal: Emotional status will improve Outcome: Progressing Goal: Mental status will improve Outcome: Progressing

## 2024-05-06 LAB — LITHIUM LEVEL: Lithium Lvl: 0.56 mmol/L — ABNORMAL LOW (ref 0.60–1.20)

## 2024-05-06 MED ORDER — RISPERIDONE 1 MG PO TABS: 1.5000 mg | ORAL_TABLET | Freq: Two times a day (BID) | ORAL | Status: DC

## 2024-05-06 MED ORDER — LITHIUM CARBONATE 300 MG PO CAPS: 750.0000 mg | ORAL_CAPSULE | Freq: Two times a day (BID) | ORAL | Status: DC

## 2024-05-06 MED ORDER — RISPERIDONE 1 MG PO TABS
1.5000 mg | ORAL_TABLET | Freq: Two times a day (BID) | ORAL | Status: DC
  Administered 2024-05-07: 1.5 mg via ORAL
  Filled 2024-05-06: qty 1

## 2024-05-06 MED ORDER — LITHIUM CARBONATE 300 MG PO CAPS
750.0000 mg | ORAL_CAPSULE | Freq: Two times a day (BID) | ORAL | Status: DC
  Administered 2024-05-07 – 2024-05-12 (×11): 750 mg via ORAL
  Filled 2024-05-06 (×11): qty 1

## 2024-05-06 NOTE — Group Note (Signed)
 Date:  05/06/2024 Time:  9:00 PM  Group Topic/Focus:  Wrap-Up Group:   The focus of this group is to help patients review their daily goal of treatment and discuss progress on daily workbooks.    Participation Level:  Active  Participation Quality:  Appropriate  Affect:  Appropriate  Cognitive:  Appropriate  Insight: Appropriate  Engagement in Group:  Engaged  Modes of Intervention:  Education and Exploration  Additional Comments:  Patient attended and participated in group tonight. She reports that she like that she have a mind of her own. She will not stop trusting herself.  Lindsay [name] 05/06/2024, 9:00 PM

## 2024-05-06 NOTE — BHH Group Notes (Signed)
 Adult Psychoeducational Group Note  Date:  05/06/2024 Time:  10:43 AM  Group Topic/Focus: Intellectual Wellness   Participation Level:    Participation Quality:    Affect:    Cognitive:    Insight:   Engagement in Group:    Modes of Intervention:    Additional Comments:    Lindsay [name] 05/06/2024, 10:43 AM

## 2024-05-06 NOTE — Progress Notes (Signed)
 Pt remains medication compliant, did tell nurse to switch medicine cups half way through medication administration because she believed the cup was contaminated. Pt denies AVH at this time as well as SI/HI. Pt does appear preoccupied and paranoid.

## 2024-05-06 NOTE — Group Note (Deleted)
 Date:  05/12/2024 Time:  6:23 AM  Group Topic/Focus:  Wrap-Up Group:   The focus of this group is to help patients review their daily goal of treatment and discuss progress on daily workbooks.    Participation Level:  {BHH PARTICIPATION OZCZO:77735}  Participation Quality:  {BHH PARTICIPATION QUALITY:22265}  Affect:  {BHH AFFECT:22266}  Cognitive:  {BHH COGNITIVE:22267}  Insight: {BHH Insight2:20797}  Engagement in Group:  {BHH ENGAGEMENT IN HMNLE:77731}  Modes of Intervention:  {BHH MODES OF INTERVENTION:22269}  Additional Comments:  ***  Gwenn Nobie [place] 05/12/2024, 6:23 AM

## 2024-05-06 NOTE — Plan of Care (Signed)
  Problem: Education: Goal: Knowledge of [facility] Education information/materials will improve Outcome: Progressing Goal: Emotional status will improve Outcome: Progressing Goal: Mental status will improve Outcome: Progressing Goal: Verbalization of understanding the information provided will improve Outcome: Progressing   Problem: Activity: Goal: Interest or engagement in activities will improve Outcome: Progressing Goal: Sleeping patterns will improve Outcome: Progressing   Problem: Coping: Goal: Ability to verbalize frustrations and anger appropriately will improve Outcome: Progressing Goal: Ability to demonstrate self-control will improve Outcome: Progressing   Problem: Health Behavior/Discharge Planning: Goal: Identification of resources available to assist in meeting health care needs will improve Outcome: Progressing Goal: Compliance with treatment plan for underlying cause of condition will improve Outcome: Progressing   Problem: Physical Regulation: Goal: Ability to maintain clinical measurements within normal limits will improve Outcome: Progressing   Problem: Safety: Goal: Periods of time without injury will increase Outcome: Progressing   Problem: Education: Goal: Knowledge of [place] Education information/materials will improve Outcome: Progressing Goal: Emotional status will improve Outcome: Progressing Goal: Mental status will improve Outcome: Progressing Goal: Verbalization of understanding the information provided will improve Outcome: Progressing   Problem: Activity: Goal: Interest or engagement in activities will improve Outcome: Progressing Goal: Sleeping patterns will improve Outcome: Progressing   Problem: Coping: Goal: Ability to verbalize frustrations and anger appropriately will improve Outcome: Progressing Goal: Ability to demonstrate self-control will improve Outcome: Progressing   Problem: Health  Behavior/Discharge Planning: Goal: Identification of resources available to assist in meeting health care needs will improve Outcome: Progressing Goal: Compliance with treatment plan for underlying cause of condition will improve Outcome: Progressing   Problem: Physical Regulation: Goal: Ability to maintain clinical measurements within normal limits will improve Outcome: Progressing   Problem: Safety: Goal: Periods of time without injury will increase Outcome: Progressing   Problem: Activity: Goal: Will identify at least one activity in which they can participate Outcome: Progressing   Problem: Coping: Goal: Ability to identify and develop effective coping behavior will improve Outcome: Progressing Goal: Ability to interact with others will improve Outcome: Progressing Goal: Demonstration of participation in decision-making regarding own care will improve Outcome: Progressing Goal: Ability to use eye contact when communicating with others will improve Outcome: Progressing   Problem: Health Behavior/Discharge Planning: Goal: Identification of resources available to assist in meeting health care needs will improve Outcome: Progressing   Problem: Self-Concept: Goal: Will verbalize positive feelings about self Outcome: Progressing   Problem: Activity: Goal: Will verbalize the importance of balancing activity with adequate rest periods Outcome: Progressing   Problem: Education: Goal: Will be free of psychotic symptoms Outcome: Progressing Goal: Knowledge of the prescribed therapeutic regimen will improve Outcome: Progressing   Problem: Coping: Goal: Coping ability will improve Outcome: Progressing Goal: Will verbalize feelings Outcome: Progressing   Problem: Health Behavior/Discharge Planning: Goal: Compliance with prescribed medication regimen will improve Outcome: Progressing   Problem: Nutritional: Goal: Ability to achieve adequate nutritional intake will  improve Outcome: Progressing   Problem: Role Relationship: Goal: Ability to communicate needs accurately will improve Outcome: Progressing Goal: Ability to interact with others will improve Outcome: Progressing   Problem: Safety: Goal: Ability to redirect hostility and anger into socially appropriate behaviors will improve Outcome: Progressing Goal: Ability to remain free from injury will improve Outcome: Progressing   Problem: Self-Care: Goal: Ability to participate in self-care as condition permits will improve Outcome: Progressing   Problem: Self-Concept: Goal: Will verbalize positive feelings about self Outcome: Progressing

## 2024-05-06 NOTE — BHH Group Notes (Signed)
 Adult Psychoeducational Group Note  Date:  05/06/2024 Time:  9:09 AM  Group Topic/Focus: Orentation Group Orientation:   The focus of this group is to educate the patient on the purpose and policies of crisis stabilization and provide a format to answer questions about their admission.  The group details unit policies and expectations of patients while admitted.  Participation Level:  Active  Participation Quality:  Appropriate  Affect:  Appropriate  Cognitive:  Appropriate  Insight: Appropriate  Engagement in Group:  Engaged  Modes of Intervention:  Discussion  Additional Comments:  The patient engaged in group discussion.  Lindsay [name] 05/06/2024, 9:09 AM

## 2024-05-06 NOTE — Progress Notes (Signed)
 [facility] Inpatient Psychiatry Progress Note  Date: 05/06/24 Patient: Lindsay [name] MRN: [id number]  Assessment and Plan: LORANN TANI is a 33 y.o. female with a history of schizoaffective disorder, bipolar type, who was admitted involuntarily for decompensated psychosis.  11/16 - Patient continues to appear manic and paranoid, though she has been compliant with medication. Increasing lithium  tomorrow and also risperidone .   # Schizoaffective disorder, bipolar type (HCC) - risperidone  1.5 mg BID - Lithium  600 mg BID - Li level 0.56 mmol/L on 11/16  # HTN - Lisinopril 10 mg BID   Risk Assessment - Intermediate  Discharge Planning Barriers to discharge: psychosis Estimated length of stay: 4-7 days Predicted Discharge location: Home     Interval History and update: Chart reviewed, no significant events overnight. She has been compliant with her prescribed medications and continues to express paranoid toward the medications, but less so. Today she told staff that she believed the cup her medications were in may have been contaminated. She has been visible in the milieu and the dayroom and has generally be appropriate when interacting with others. On assessment she was dismissive to aloof, and stated that she was feeling fine and denied any complaints. Staff reported last night that she was expressing religious delusions and paranoia. On assessment she did not give up any delusional TC but seemed to want to avoid engaging in discussion.       Physical Exam MSK/Neuro - Normal gait and station  Mental Status Exam Appearance - Casually dressed, appropriate hygiene and grooming  Attitude - Calm, polite, aloof Speech - normal volume, prosody, inflection Mood - okay Affect - Restricted and pleasant Thought Process - GD, linear Thought Content - No delusions were spontaneously expressed SI/HI - Denies  Perceptions - Denies AVH; not RIS Judgement/Insight -  Limited Fund of knowledge - WNL Language - No impairments      Lab Results:  Admission on 05/02/2024  Component Date Value Ref Range Status   Lithium  Lvl 05/06/2024 0.56 (L)  0.60 - 1.20 mmol/L Final     Vitals: Blood pressure (!) 141/72, pulse 83, temperature 98.2 F (36.8 C), temperature source Oral, resp. rate 18, height 5' 3 (1.6 m), weight (!) 144.2 kg, SpO2 99%.    Oliva DELENA Salmon, DO

## 2024-05-06 NOTE — Group Note (Deleted)
 Date:  05/06/2024 Time:  7:20 AM  Group Topic/Focus:  Wrap-Up Group:   The focus of this group is to help patients review their daily goal of treatment and discuss progress on daily workbooks.     Participation Level:  {BHH PARTICIPATION OZCZO:77735}  Participation Quality:  {BHH PARTICIPATION QUALITY:22265}  Affect:  {BHH AFFECT:22266}  Cognitive:  {BHH COGNITIVE:22267}  Insight: {BHH Insight2:20797}  Engagement in Group:  {BHH ENGAGEMENT IN HMNLE:77731}  Modes of Intervention:  {BHH MODES OF INTERVENTION:22269}  Additional Comments:  ***  Gwenn Nobie [place] 05/06/2024, 7:20 AM

## 2024-05-07 MED ORDER — RISPERIDONE 1 MG PO TABS
1.5000 mg | ORAL_TABLET | Freq: Two times a day (BID) | ORAL | Status: DC
  Administered 2024-05-07 – 2024-05-12 (×10): 1.5 mg via ORAL
  Filled 2024-05-07 (×10): qty 1

## 2024-05-07 MED ORDER — HALOPERIDOL LACTATE 5 MG/ML IJ SOLN
2.0000 mg | Freq: Two times a day (BID) | INTRAMUSCULAR | Status: DC
  Filled 2024-05-07: qty 1

## 2024-05-07 NOTE — Progress Notes (Signed)
 [facility] Second Physician Opinion Progress Note for Medication Administration to Non-consenting Patients (For Involuntarily Committed Patients)  Patient: Lindsay [name] Date of Birth: [birth year] MRN: [id number]  Reason for the Medication: The patient, without the benefit of the specific treatment measure, is incapable of participating in any available treatment plan that will give the patient a realistic opportunity of improving the patient's condition. There is, without the benefit of the specific treatment measure, a significant possibility that the patient will harm self or others before improvement of the patient's condition is realized.  Consideration of Side Effects: Consideration of the side effects related to the medication plan has been given.  Rationale for Medication Administration: Patient continues to demonstrate significant psychotic/manic symptoms, including delusions, paranoia (often towards medications, fears of contamination), and ongoing poor insight into her condition and need for treatment. Does not believe that treatment team are really physicians, etc. Ongoing paranoia and odd behaviors raise risk of harm to others (due to fears she is being poisoned or threatened) and additionally raise the risk of retaliatory violence by those in the community that may feel threatened by her. Patient cannot be safety discharged without appropriate medications. Currently ongoing symptoms warrant involuntary medication for the safety of the patient and others.     Lindsay LOISE Arts, MD 05/07/24  1:57 PM   This documentation is good for (7) seven days from the date of the MD signature. New documentation must be completed every seven (7) days with detailed justification in the medical record if the patient requires continued non-emergent administration of psychotropic medications.

## 2024-05-07 NOTE — BHH Group Notes (Signed)
 BHH Group Notes:  (Nursing/MHT/Case Management/Adjunct)  Date:  05/07/2024  Time:  10:50 PM  Type of Therapy:  Wrap up group  Participation Level:  Active  Participation Quality:  Appropriate  Affect:  Appropriate  Cognitive:  Appropriate  Insight:  Appropriate  Engagement in Group:  Engaged  Modes of Intervention:  Education  Summary of Progress/Problems:Goal to have a good day. Rated day 10/10.  Lindsay [name] [place] 05/07/2024, 10:50 PM

## 2024-05-07 NOTE — BHH Group Notes (Signed)
 Adult Psychoeducational Group Note  Date:  05/07/2024 Time:  1:24 PM  Group Topic/Focus: Recreation Therapy Self Care:   The focus of this group is to help patients understand the importance of self-care in order to improve or restore emotional, physical, spiritual, interpersonal, and financial health.  Participation Level:  Active  Participation Quality:  Appropriate  Affect:  Appropriate  Cognitive:  Appropriate  Insight: Appropriate  Engagement in Group:  Engaged  Modes of Intervention:  Discussion  Additional Comments: The patient engaged in discussion.  Jerred Zaremba Lee 05/07/2024, 1:24 PM

## 2024-05-07 NOTE — Group Note (Signed)
 Recreation Therapy Group Note   Group Topic:Coping Skills  Group Date: 05/07/2024 Start Time: 1035 End Time: 1100 Facilitators: Tydarius Yawn-McCall, LRT,CTRS Location: [address] Dayroom   Group Topic: Coping Skills   Goal Area(s) Addresses: Patient will define what a coping skill is. Patient will create a list of healthy coping skills beginning with each letter of the alphabet. Patient will successfully identify positive coping skills they can use post d/c.   Behavioral Response:    Intervention: Worksheet   Activity: Coping A to Z. Patient asked to identify what a coping skill is and when they use them. Patients with clinical research associate discussed healthy versus unhealthy coping skills. Next patients were given a blank worksheet titled Coping Skills A-Z. Patients were instructed to come up with at least one positive coping skill per letter of the alphabet. Patients were given 15 minutes to brainstorm before ideas were presented to the large group. Patients and LRT debriefed on the importance of coping skill selection based on situation and back-up plans when a skill tried is not effective. At the end of group, patients were given an handout of alphabetized strategies to keep for future reference.   Education: Pharmacologist, Scientist, Physiological, Discharge Planning.    Education Outcome: Acknowledges education/Verbalizes understanding/In group clarification offered/Additional education needed    Clinical Observations/Individualized Feedback: LRT didn't conduct group due to assisting with dayroom duties.     Plan: Continue to engage patient in RT group sessions 2-3x/week.   Alexea Blase-McCall, LRT,CTRS  05/07/2024 1:40 PM

## 2024-05-07 NOTE — Progress Notes (Addendum)
 Conversation with patient:  Patient said she lives alone.  She said she doesn't have guns or weapons or access to guns or weapons.   Lura Falor, LCSWA 05/07/2024

## 2024-05-07 NOTE — Progress Notes (Signed)
 [facility] Inpatient Psychiatry Progress Note  Date: 05/07/24 Patient: Lindsay [name] MRN: [id number]  Assessment and Plan: Lindsay [name] is a 33 y.o. female with a history of schizoaffective disorder, bipolar type, who was admitted involuntarily for decompensated psychosis.  11/16 - Patient continues to appear manic and paranoid, though she has been compliant with medication. Increasing lithium  tomorrow and also risperidone .   11/17 -patient was seen in her room during rounds.  She remains guarded and paranoid.  She initially did not recognize me as a doctor and frankly stated you are not my doctor.  She refused to tell me what city she lived in due to paranoia.  She exhibited significant thought blocking.  She initially refused medications this morning, but did take the medicines later in the day.  I asked Dr. Towana to evaluate the patient for a second opinion on nonemergent forced medications should she refuse.  Dr. Towana is in agreement that the patient would benefit from nonemergent forced medications if she is noncompliant in the hospital.  Dr. Vallarie note indicated that the patient is getting 600 mg of lithium  twice daily.  I see that at 1653, Dr. Prentis placed an order to increase lithium  to 750 mg twice daily.  Patient's lithium  level on 11/16 was subtherapeutic at 0.56.  Will plan on continuing the current treatment plan, and will recheck lithium  on Tuesday night.  # Schizoaffective disorder, bipolar type (HCC) - risperidone  1.5 mg BID - Lithium  750 mg BID - Li level 0.56 mmol/L on 11/16  # HTN - Lisinopril 10 mg BID   Risk Assessment - Intermediate  Discharge Planning Barriers to discharge: psychosis Estimated length of stay: 4-7 days Predicted Discharge location: Home     Interval History and update: Chart reviewed, no significant events overnight.      Physical Exam MSK/Neuro - Normal gait and station  Mental Status Exam Appearance  -disheveled Attitude -uncooperative Speech -decrease in rate, amount, and volume Mood - okay Affect -incongruent, restricted and anxious Thought Process -thought blocking Thought Content - No delusions were spontaneously expressed SI/HI - Denies  Perceptions - Denies AVH; not RIS Judgement/Insight - Limited Fund of knowledge - WNL Language - No impairments      Lab Results:  Admission on 05/02/2024  Component Date Value Ref Range Status   Lithium  Lvl 05/06/2024 0.56 (L)  0.60 - 1.20 mmol/L Final     Vitals: Blood pressure 129/62, pulse 82, temperature 98.2 F (36.8 C), temperature source Oral, resp. rate 18, height 5' 3 (1.6 m), weight (!) 144.2 kg, SpO2 99%.    Starleen GORMAN Kitty, MD

## 2024-05-07 NOTE — Plan of Care (Signed)
  Problem: Education: Goal: Emotional status will improve Outcome: Progressing   Problem: Activity: Goal: Interest or engagement in activities will improve Outcome: Progressing Goal: Sleeping patterns will improve Outcome: Progressing   Problem: Safety: Goal: Periods of time without injury will increase Outcome: Progressing   Problem: Education: Goal: Mental status will improve Outcome: Not Progressing

## 2024-05-07 NOTE — Progress Notes (Signed)
(  Sleep Hours) -8.75  (Any PRNs that were needed, meds refused, or side effects to meds)- Melatonin 5 mg  (Any disturbances and when (visitation, over night)-none  (Concerns raised by the patient)- that's too much Lithium  pt tried to cheek her medications , pt tried to keep one of her pills in her hand and walk away from the med window, then when pt took it she tried to cheek it, after much encouragement pt appeared to take her medication Ask me 3 discussed with pt and verbal understanding stated.    (SI/HI/AVH)- denies

## 2024-05-07 NOTE — Progress Notes (Addendum)
 Collateral contact - Chief Operating Officer Team (ACTT) - [facility], [address] [place], [place] [address], [phone]  It was confirmed that patient receives services and her appointment with a psychiatrist is on 06/18/2024.  ACTT Crisis phone number:  [phone]    Christain Niznik, LCSWA 05/07/2024

## 2024-05-07 NOTE — Progress Notes (Signed)

## 2024-05-07 NOTE — Group Note (Addendum)
 LCSW Group Therapy Note   Group Date: 05/07/2024 Start Time: 1100 End Time: 1200   Participation:  did not attend  Type of Therapy:  Group Therapy  Topic:  Shining from Within: Confidence and Self-Love Journey  Objective:  To support participants in developing confidence and self-love through self-awareness, self-compassion, and practical skills that nurture personal growth.   Group Goals Encourage self-reflection and self-acceptance by identifying personal strengths and achievements. Teach skills to challenge negative self-talk and replace it with supportive, truthful self-talk. Foster resilience and self-worth through owens & minor, gratitude, and self-care practices.   Summary:  This group explores the connection between confidence and self-love by guiding participants through reflection, mindset shifts, and practical tools like affirmations, strength recognition, and goal-setting. Activities are designed to promote self-compassion, build emotional resilience, and normalize the slow, patient journey of inner growth.   Therapeutic Modalities Used Cognitive Behavioral Therapy (CBT): Challenging and reframing unhelpful self-talk. Motivational Interviewing (MI): Encouraging small, achievable goals. Elements of Dialectical Behavioral Therapist (DBT):  Mindfulness and Self-Compassion: Promoting present-moment awareness and kindness toward self.   Gottlieb Zuercher O Ryland Smoots, LCSWA 05/07/2024  5:33 PM

## 2024-05-07 NOTE — Progress Notes (Signed)
(  Sleep Hours) - 8.5 (Any PRNs that were needed, meds refused, or side effects to meds)-none (Any disturbances and when (visitation, over night) (Concerns raised by the patient)- none (SI/HI/AVH)-denies

## 2024-05-07 NOTE — BHH Group Notes (Signed)
 BHH Group Notes:  (Nursing/MHT/Case Management/Adjunct)  Date:  05/07/2024  Time:  3:32 PM  Type of Therapy:  Group Therapy  Participation Level:  Active  Participation Quality:  Appropriate  Affect:  Appropriate  Cognitive:  Appropriate  Insight:  Appropriate  Engagement in Group:  Engaged  Modes of Intervention:  Discussion    [name] 05/07/2024, 3:32 PM

## 2024-05-07 NOTE — Plan of Care (Signed)
  Problem: Education: Goal: Knowledge of [facility] Education information/materials will improve Outcome: Progressing Goal: Emotional status will improve Outcome: Progressing Goal: Mental status will improve Outcome: Progressing Goal: Verbalization of understanding the information provided will improve Outcome: Progressing   Problem: Activity: Goal: Interest or engagement in activities will improve Outcome: Progressing Goal: Sleeping patterns will improve Outcome: Progressing   Problem: Coping: Goal: Ability to verbalize frustrations and anger appropriately will improve Outcome: Progressing Goal: Ability to demonstrate self-control will improve Outcome: Progressing   Problem: Health Behavior/Discharge Planning: Goal: Identification of resources available to assist in meeting health care needs will improve Outcome: Progressing Goal: Compliance with treatment plan for underlying cause of condition will improve Outcome: Progressing   Problem: Physical Regulation: Goal: Ability to maintain clinical measurements within normal limits will improve Outcome: Progressing   Problem: Safety: Goal: Periods of time without injury will increase Outcome: Progressing   Problem: Education: Goal: Knowledge of [place] Education information/materials will improve Outcome: Progressing Goal: Emotional status will improve Outcome: Progressing Goal: Mental status will improve Outcome: Progressing Goal: Verbalization of understanding the information provided will improve Outcome: Progressing   Problem: Activity: Goal: Interest or engagement in activities will improve Outcome: Progressing Goal: Sleeping patterns will improve Outcome: Progressing   Problem: Coping: Goal: Ability to verbalize frustrations and anger appropriately will improve Outcome: Progressing Goal: Ability to demonstrate self-control will improve Outcome: Progressing   Problem: Health  Behavior/Discharge Planning: Goal: Identification of resources available to assist in meeting health care needs will improve Outcome: Progressing Goal: Compliance with treatment plan for underlying cause of condition will improve Outcome: Progressing   Problem: Physical Regulation: Goal: Ability to maintain clinical measurements within normal limits will improve Outcome: Progressing   Problem: Safety: Goal: Periods of time without injury will increase Outcome: Progressing   Problem: Activity: Goal: Will identify at least one activity in which they can participate Outcome: Progressing   Problem: Coping: Goal: Ability to identify and develop effective coping behavior will improve Outcome: Progressing Goal: Ability to interact with others will improve Outcome: Progressing Goal: Demonstration of participation in decision-making regarding own care will improve Outcome: Progressing Goal: Ability to use eye contact when communicating with others will improve Outcome: Progressing   Problem: Health Behavior/Discharge Planning: Goal: Identification of resources available to assist in meeting health care needs will improve Outcome: Progressing   Problem: Self-Concept: Goal: Will verbalize positive feelings about self Outcome: Progressing   Problem: Activity: Goal: Will verbalize the importance of balancing activity with adequate rest periods Outcome: Progressing   Problem: Education: Goal: Will be free of psychotic symptoms Outcome: Progressing Goal: Knowledge of the prescribed therapeutic regimen will improve Outcome: Progressing   Problem: Coping: Goal: Coping ability will improve Outcome: Progressing Goal: Will verbalize feelings Outcome: Progressing   Problem: Health Behavior/Discharge Planning: Goal: Compliance with prescribed medication regimen will improve Outcome: Progressing   Problem: Nutritional: Goal: Ability to achieve adequate nutritional intake will  improve Outcome: Progressing   Problem: Role Relationship: Goal: Ability to communicate needs accurately will improve Outcome: Progressing Goal: Ability to interact with others will improve Outcome: Progressing   Problem: Safety: Goal: Ability to redirect hostility and anger into socially appropriate behaviors will improve Outcome: Progressing Goal: Ability to remain free from injury will improve Outcome: Progressing   Problem: Self-Care: Goal: Ability to participate in self-care as condition permits will improve Outcome: Progressing   Problem: Self-Concept: Goal: Will verbalize positive feelings about self Outcome: Progressing

## 2024-05-07 NOTE — Progress Notes (Signed)
 Pt presents paranoid today. Pt medication noncompliant. Pt allowed nurse to assess vs after much encouragement and with difficulty. Provider notified.

## 2024-05-07 NOTE — BHH Group Notes (Signed)
 Adult Psychoeducational Group Note  Date:  05/07/2024 Time:  1:46 PM  Group Topic/Focus: Socialwork Group Dimensions of Wellness:   The focus of this group is to introduce the topic of wellness and discuss the role each dimension of wellness plays in total health.  Participation Level:  Did Not Attend  Participation Quality:    Affect:    Cognitive:    Insight:   Engagement in Group:    Modes of Intervention:   Additional Comments:    Lindsay [name] 05/07/2024, 1:46 PM

## 2024-05-08 ENCOUNTER — Other Ambulatory Visit (HOSPITAL_COMMUNITY): Payer: Self-pay

## 2024-05-08 ENCOUNTER — Telehealth (HOSPITAL_COMMUNITY): Payer: Self-pay | Admitting: Pharmacy Technician

## 2024-05-08 MED ORDER — PALIPERIDONE PALMITATE ER 156 MG/ML IM SUSY: 156.0000 mg | PREFILLED_SYRINGE | Freq: Once | INTRAMUSCULAR | Status: DC

## 2024-05-08 MED ORDER — PALIPERIDONE PALMITATE ER 234 MG/1.5ML IM SUSY
234.0000 mg | PREFILLED_SYRINGE | Freq: Once | INTRAMUSCULAR | Status: AC
  Administered 2024-05-08: 234 mg via INTRAMUSCULAR

## 2024-05-08 NOTE — Progress Notes (Signed)
 Collateral contact  - Raymund Cassis (mother) [phone]   Mom said that she is sick and will not be available to pick up patient upon discharge.   Elisama Thissen, LCSWA 05/08/2024

## 2024-05-08 NOTE — Group Note (Deleted)
 Date:  05/08/2024 Time:  5:11 PM

## 2024-05-08 NOTE — Plan of Care (Signed)
 Problem: Education: Goal: Knowledge of [facility] Education information/materials will improve Outcome: Completed/Met Goal: Emotional status will improve Outcome: Completed/Met Goal: Mental status will improve Outcome: Completed/Met Goal: Verbalization of understanding the information provided will improve Outcome: Completed/Met   Problem: Activity: Goal: Interest or engagement in activities will improve Outcome: Completed/Met Goal: Sleeping patterns will improve Outcome: Completed/Met   Problem: Coping: Goal: Ability to verbalize frustrations and anger appropriately will improve Outcome: Completed/Met Goal: Ability to demonstrate self-control will improve Outcome: Completed/Met   Problem: Health Behavior/Discharge Planning: Goal: Identification of resources available to assist in meeting health care needs will improve Outcome: Completed/Met Goal: Compliance with treatment plan for underlying cause of condition will improve Outcome: Completed/Met   Problem: Physical Regulation: Goal: Ability to maintain clinical measurements within normal limits will improve Outcome: Completed/Met   Problem: Safety: Goal: Periods of time without injury will increase Outcome: Completed/Met   Problem: Education: Goal: Knowledge of [facility] Education information/materials will improve Outcome: Completed/Met Goal: Emotional status will improve Outcome: Completed/Met Goal: Mental status will improve Outcome: Completed/Met Goal: Verbalization of understanding the information provided will improve Outcome: Completed/Met   Problem: Activity: Goal: Interest or engagement in activities will improve Outcome: Completed/Met Goal: Sleeping patterns will improve Outcome: Completed/Met   Problem: Coping: Goal: Ability to verbalize frustrations and anger appropriately will improve Outcome: Completed/Met Goal: Ability to demonstrate self-control will improve Outcome:  Completed/Met   Problem: Health Behavior/Discharge Planning: Goal: Identification of resources available to assist in meeting health care needs will improve Outcome: Completed/Met Goal: Compliance with treatment plan for underlying cause of condition will improve Outcome: Completed/Met   Problem: Physical Regulation: Goal: Ability to maintain clinical measurements within normal limits will improve Outcome: Completed/Met   Problem: Safety: Goal: Periods of time without injury will increase Outcome: Completed/Met   Problem: Activity: Goal: Will identify at least one activity in which they can participate Outcome: Completed/Met   Problem: Coping: Goal: Ability to identify and develop effective coping behavior will improve Outcome: Completed/Met Goal: Ability to interact with others will improve Outcome: Completed/Met Goal: Demonstration of participation in decision-making regarding own care will improve Outcome: Completed/Met Goal: Ability to use eye contact when communicating with others will improve Outcome: Completed/Met   Problem: Health Behavior/Discharge Planning: Goal: Identification of resources available to assist in meeting health care needs will improve Outcome: Completed/Met   Problem: Self-Concept: Goal: Will verbalize positive feelings about self Outcome: Completed/Met   Problem: Activity: Goal: Will verbalize the importance of balancing activity with adequate rest periods Outcome: Completed/Met   Problem: Education: Goal: Will be free of psychotic symptoms Outcome: Completed/Met Goal: Knowledge of the prescribed therapeutic regimen will improve Outcome: Completed/Met   Problem: Coping: Goal: Coping ability will improve Outcome: Completed/Met Goal: Will verbalize feelings Outcome: Completed/Met   Problem: Health Behavior/Discharge Planning: Goal: Compliance with prescribed medication regimen will improve Outcome: Completed/Met   Problem:  Nutritional: Goal: Ability to achieve adequate nutritional intake will improve Outcome: Completed/Met   Problem: Role Relationship: Goal: Ability to communicate needs accurately will improve Outcome: Completed/Met Goal: Ability to interact with others will improve Outcome: Completed/Met   Problem: Safety: Goal: Ability to redirect hostility and anger into socially appropriate behaviors will improve Outcome: Completed/Met Goal: Ability to remain free from injury will improve Outcome: Completed/Met   Problem: Self-Care: Goal: Ability to participate in self-care as condition permits will improve Outcome: Completed/Met   Problem: Self-Concept: Goal: Will verbalize positive feelings about self Outcome: Completed/Met

## 2024-05-08 NOTE — [facility] Group Notes (Signed)
 Adult Psychoeducational Group Note  Date:  05/08/2024 Time:  1:26 PM  Group Topic/Focus: Pharmacy Group Diagnosis Education:   The focus of this group is to discuss the major disorders that patients maybe diagnosed with.  Group discusses the importance of knowing what one's diagnosis is so that one can understand treatment and better advocate for oneself.  Participation Level:  Active  Participation Quality:  Appropriate  Affect:  Appropriate  Cognitive:  Appropriate  Insight: Appropriate  Engagement in Group:  Engaged  Modes of Intervention:  Discussion  Additional Comments: The patient engaged in group discussion.  Lindsay [name] 05/08/2024, 1:26 PM

## 2024-05-08 NOTE — Progress Notes (Signed)
 [facility] Inpatient Psychiatry Progress Note  Date: 05/08/24 Patient: Lindsay [name] MRN: [id number]  Assessment and Plan: Lindsay [name] is a 33 y.o. female with a history of schizoaffective disorder, bipolar type, who was admitted involuntarily for decompensated psychosis.  11/16 - Patient continues to appear manic and paranoid, though she has been compliant with medication. Increasing lithium  tomorrow and also risperidone .   11/17 -patient was seen in her room during rounds.  She remains guarded and paranoid.  She initially did not recognize me as a doctor and frankly stated you are not my doctor.  She refused to tell me what city she lived in due to paranoia.  She exhibited significant thought blocking.  She initially refused medications this morning, but did take the medicines later in the day.  I asked Dr. Towana to evaluate the patient for a second opinion on nonemergent forced medications should she refuse.  Dr. Towana is in agreement that the patient would benefit from nonemergent forced medications if she is noncompliant in the hospital.  Dr. Vallarie note indicated that the patient is getting 600 mg of lithium  twice daily.  I see that at 1653, Dr. Prentis placed an order to increase lithium  to 750 mg twice daily.  Patient's lithium  level on 11/16 was subtherapeutic at 0.56.  Will plan on continuing the current treatment plan, and will recheck lithium  on Tuesday night.  11/18 -patient was seen in the day room during rounds.  Her mood was notably brighter today.  Her affect was full.  She was linear in conversation and participated in the treatment plan.  We discussed the rationale for medications, and she supports continuing with the current treatment plan.  She was amenable to starting an LAI with a goal of mitigating future hospital admissions.  Will start Invega Sustenna today, and she will need an second injection sometime between Saturday and Tuesday.  She denied  auditory hallucinations, but may be minimizing.  # Schizoaffective disorder, bipolar type (HCC) - risperidone  1.5 mg BID, Haldol  scheduled as a nonemergent forced medication if patient refuses risperidone  -Initiate Invega Sustenna 234 mg on 11/18, and plan for Invega Sustenna 156 mg on Saturday 11/22 - Lithium  750 mg BID - Li level 0.56 mmol/L on 11/16, next level scheduled for Wednesday morning at 8 AM  # HTN - Lisinopril 10 mg BID   Risk Assessment - Intermediate  Discharge Planning Barriers to discharge: psychosis Estimated length of stay: 4-7 days Predicted Discharge location: Home     Interval History and update: Chart reviewed, no significant events overnight.      Physical Exam MSK/Neuro - Normal gait and station   Psychiatric Specialty Exam:  Presentation  General Appearance: Appropriate for environment Eye Contact: Good  Speech: Clear and Coherent  Speech Volume: Normal  Handedness: Right   Mood and Affect  Mood: Anxious; Euthymic  Affect: Full Range   Thought Process  Thought Processes: Coherent; Linear  Descriptions of Associations: Intact  Orientation: Full (Time, Place and Person)  Thought Content: Delusions; Paranoid Ideation  History of Schizophrenia/Schizoaffective disorder: Yes  Duration of Psychotic Symptoms: NA Hallucinations: Hallucinations: Other (comment) (denies)  Ideas of Reference: None  Suicidal Thoughts: Suicidal Thoughts: No  Homicidal Thoughts: Homicidal Thoughts: No   Sensorium  Memory: Immediate Good  Judgment: Poor  Insight: Poor   Executive Functions  Concentration: Fair  Attention Span: Fair  Recall: Good  Fund of Knowledge: Good  Language: Good   Psychomotor Activity  Psychomotor Activity: Psychomotor  Activity: Normal   Assets  Assets: Communication Skills; Desire for Improvement   Sleep  Sleep: Sleep: Fair        Lab Results:  Admission on 05/02/2024  Component Date Value Ref  Range Status   Lithium  Lvl 05/06/2024 0.56 (L)  0.60 - 1.20 mmol/L Final     Vitals: Blood pressure 110/73, pulse 100, temperature 98.4 F (36.9 C), temperature source Oral, resp. rate 18, height 5' 3 (1.6 m), weight (!) 144.2 kg, SpO2 98%.    Starleen GORMAN Kitty, MD

## 2024-05-08 NOTE — Group Note (Signed)
 Date:  05/08/2024 Time:  8:51 PM  Group Topic/Focus:  Wrap-Up Group:   The focus of this group is to help patients review their daily goal of treatment and discuss progress on daily workbooks.    Participation Level:  Active  Participation Quality:  Appropriate  Affect:  Appropriate  Cognitive:  Appropriate  Insight: Appropriate  Engagement in Group:  Engaged  Modes of Intervention:  Education and Exploration  Additional Comments:  Patient attended and participated in group tonight. She reports that today she learn that people has different walks in life.  Lindsay [name] 05/08/2024, 8:51 PM

## 2024-05-08 NOTE — [facility] Group Notes (Signed)
 Adult Psychoeducational Group Note  Date:  05/08/2024 Time:  11:00 AM  Group Topic/Focus: Orentation Group Orientation:   The focus of this group is to educate the patient on the purpose and policies of crisis stabilization and provide a format to answer questions about their admission.  The group details unit policies and expectations of patients while admitted.  Participation Level:  Active  Participation Quality:  Appropriate  Affect:  Appropriate  Cognitive:  Appropriate  Insight: Appropriate  Engagement in Group:  Engaged  Modes of Intervention:  Discussion  Additional Comments:  The patient engaged in discussion.  Lindsay [name] 05/08/2024, 11:00 AM

## 2024-05-08 NOTE — Group Note (Signed)
 Recreation Therapy Group Note   Group Topic:Leisure Education  Group Date: 05/08/2024 Start Time: 1045 End Time: 1115 Facilitators: Orlando Thalmann-McCall, LRT,CTRS Location: [address] Dayroom   Group Topic: Leisure Education  Goal Area(s) Addresses:  Patient will identify positive leisure activities for use post discharge. Patient will identify at least one positive benefit of participation in leisure activities.  Patient will work effectively with peers to reach end goal of activity.   Behavioral Response: Engaged   Intervention: Cooperative Play   Activity: In a circle, patients were seated and had to hit a beach ball to each other as if playing volleyball. LRT would time the group to see how long they could keep the ball moving without it coming to a complete stop. If the ball came to a stop, LRT would start the time over. Patients were to remain seated and could only get up if the ball went out of the circle.  Education:  Leisure Scientist, Physiological, Special Educational Needs Teacher, Teamwork, Discharge Planning  Education Outcome: Acknowledges education/In group clarification offered/Needs additional education.    Affect/Mood: Appropriate   Participation Level: Engaged   Participation Quality: Independent   Behavior: Appropriate   Speech/Thought Process: Focused   Insight: Good   Judgement: Good   Modes of Intervention: Cooperative Play   Patient Response to Interventions:  Engaged   Education Outcome:  In group clarification offered    Clinical Observations/Individualized Feedback: Pt was engaged and expressed she was enjoying herself. Pt was social with peers and determined for group to be successful with activity.      Plan: Continue to engage patient in RT group sessions 2-3x/week.   Phillips Goulette-McCall, LRT,CTRS 05/08/2024 1:25 PM

## 2024-05-08 NOTE — Progress Notes (Signed)
   05/08/24 0900  Psych Admission Type (Psych Patients Only)  Admission Status Involuntary  Psychosocial Assessment  Patient Complaints None  Eye Contact Fair  Facial Expression Anxious;Animated  Affect Appropriate to circumstance  Speech Logical/coherent  Interaction Assertive  Motor Activity Slow  Appearance/Hygiene Body odor;Disheveled  Behavior Characteristics Calm;Cooperative  Mood Preoccupied  Thought Process  Coherency Circumstantial  Content Paranoia;Preoccupation  Delusions Paranoid  Perception WDL  Hallucination None reported or observed  Judgment Impaired  Confusion WDL  Danger to Self  Current suicidal ideation? Denies  Danger to Others  Danger to Others None reported or observed

## 2024-05-08 NOTE — Progress Notes (Signed)
 Pt came from breakfast requesting her AC meds , they were given per [facility] with no issue, pt appeared to take them all

## 2024-05-08 NOTE — [facility] Suicide Risk Assessment (Addendum)
 [facility] INPATIENT:  Family/Significant Other Suicide Prevention Education  Suicide Prevention Education:  Education Completed; Erminio Cook (female cousin) [phone],  (name of family member/significant other) has been identified by the patient as the family member/significant other with whom the patient will be residing, and identified as the person(s) who will aid the patient in the event of a mental health crisis (suicidal ideations/suicide attempt).  With written consent from the patient, the family member/significant other has been provided the following suicide prevention education, prior to the and/or following the discharge of the patient.  Cousin said patient doesn't have any guns or weapons.  Patient lives alone.  Cousin said that she can pick up patient upon discharge.  Cousin would prefer to pick her up in the afternoon, but she will see if she could pick her up in the morning.    Cousin said that patient is doing well when she is taking medications, but she doesn't take medications when she is at home.  The suicide prevention education provided includes the following: Suicide risk factors Suicide prevention and interventions National Suicide Hotline telephone number [facility] assessment telephone number [facility] Emergency Assistance 911 [facility] and/or Residential Mobile Crisis Unit telephone number  Request made of family/significant other to: Remove weapons (e.g., guns, rifles, knives), all items previously/currently identified as safety concern.   Remove drugs/medications (over-the-counter, prescriptions, illicit drugs), all items previously/currently identified as a safety concern.  The family member/significant other verbalizes understanding of the suicide prevention education information provided.  The family member/significant other agrees to remove the items of safety concern listed above.   Manaal Mandala O Jovoni Borkenhagen, LCSWA 05/08/2024, 10:30 AM

## 2024-05-08 NOTE — Telephone Encounter (Signed)
 Patient Product/process Development Scientist completed.    The patient is insured through [employer].     Ran test claim for Invega Sustenna 156 mg/ml Susy and the current 30 day co-pay is $4.00.   This test claim was processed through [facility]- copay amounts may vary at other pharmacies due to pharmacy/plan contracts, or as the patient moves through the different stages of their insurance plan.     Reyes Sharps, CPHT Pharmacy Technician Patient Advocate Specialist Lead [facility] Patient Advocate Team Direct Number: [phone]  Fax: [phone]

## 2024-05-08 NOTE — [facility] Group Notes (Signed)
 Adult Psychoeducational Group Note  Date:  05/08/2024 Time:  11:14 AM  Group Topic/Focus: Recreation Therapy Self Care:   The focus of this group is to help patients understand the importance of self-care in order to improve or restore emotional, physical, spiritual, interpersonal, and financial health.  Participation Level:  Active  Participation Quality:  Appropriate  Affect:  Appropriate  Cognitive:  Appropriate  Insight: Appropriate  Engagement in Group:  Engaged  Modes of Intervention:  Discussion  Additional Comments:  The patient participated in group activities.  Lindsay [name] 05/08/2024, 11:14 AM

## 2024-05-08 NOTE — Plan of Care (Signed)
  Problem: Education: Goal: Emotional status will improve Outcome: Progressing Goal: Mental status will improve Outcome: Progressing   Problem: Coping: Goal: Ability to demonstrate self-control will improve Outcome: Progressing   Problem: Physical Regulation: Goal: Ability to maintain clinical measurements within normal limits will improve Outcome: Progressing   Problem: Activity: Goal: Sleeping patterns will improve Outcome: Progressing

## 2024-05-09 ENCOUNTER — Encounter (HOSPITAL_COMMUNITY): Payer: Self-pay

## 2024-05-09 DIAGNOSIS — F312 Bipolar disorder, current episode manic severe with psychotic features: Principal | ICD-10-CM | POA: Diagnosis present

## 2024-05-09 DIAGNOSIS — E119 Type 2 diabetes mellitus without complications: Secondary | ICD-10-CM

## 2024-05-09 LAB — LITHIUM LEVEL: Lithium Lvl: 0.67 mmol/L (ref 0.60–1.20)

## 2024-05-09 MED ORDER — METFORMIN HCL 500 MG PO TABS
500.0000 mg | ORAL_TABLET | Freq: Two times a day (BID) | ORAL | Status: DC
  Administered 2024-05-09 – 2024-05-12 (×6): 500 mg via ORAL
  Filled 2024-05-09 (×6): qty 1

## 2024-05-09 NOTE — Progress Notes (Signed)
(  Sleep Hours) - 6 (Any PRNs that were needed, meds refused, or side effects to meds)- Melatonin 5 mg, medication effective (Any disturbances and when (visitation, over night)- none reported (Concerns raised by the patient)- no concerns (SI/HI/AVH)- Denies all Pt observed in the milieu interacting with patients.  Calm and cooperative during assessment.

## 2024-05-09 NOTE — Progress Notes (Signed)
 [facility] Inpatient Psychiatry Progress Note  Date: 05/09/24 Patient: Lindsay [name] MRN: [id number]  Assessment and Plan: Lindsay [name] is a 33 y.o. female with a history of schizoaffective disorder, bipolar type, who was admitted involuntarily for decompensated psychosis.  11/16 - Patient continues to appear manic and paranoid, though she has been compliant with medication. Increasing lithium  tomorrow and also risperidone .   11/17 -patient was seen in her room during rounds.  She remains guarded and paranoid.  She initially did not recognize me as a doctor and frankly stated you are not my doctor.  She refused to tell me what city she lived in due to paranoia.  She exhibited significant thought blocking.  She initially refused medications this morning, but did take the medicines later in the day.  I asked Dr. Towana to evaluate the patient for a second opinion on nonemergent forced medications should she refuse.  Dr. Towana is in agreement that the patient would benefit from nonemergent forced medications if she is noncompliant in the hospital.  Dr. Vallarie note indicated that the patient is getting 600 mg of lithium  twice daily.  I see that at 1653, Dr. Prentis placed an order to increase lithium  to 750 mg twice daily.  Patient's lithium  level on 11/16 was subtherapeutic at 0.56.  Will plan on continuing the current treatment plan, and will recheck lithium  on Tuesday night.  11/18 -patient was seen in the day room during rounds.  Her mood was notably brighter today.  Her affect was full.  She was linear in conversation and participated in the treatment plan.  We discussed the rationale for medications, and she supports continuing with the current treatment plan.  She was amenable to starting an LAI with a goal of mitigating future hospital admissions.  Will start Invega Sustenna today, and she will need an second injection sometime between Saturday and Tuesday.  She denied  auditory hallucinations, but may be minimizing.  11/19- mood, behavior and affect are stabilizing.  She does report feeling awesome and likely still a little elevated.  However, no paranoid statements, no pressured speech or flight of ideas.  Insight is much improved as she recognizes that she became manic after being medication noncompliant.  The patient is able to inform me of previous episodes of mania and describes them well.  Sleep is stabilizing and no abnormal behavior, no hallucinations or labile mood.  Getting along well with others on unit. - Lithium  level today was 0.67    # Bipolar 1 disorder, severe, current or most recent episode manic, with psychotic features (HCC) - continue 1.5 mg BID for mood stabilization and mania  - Initiate Invega Sustenna 234 mg on 11/18, and plan for Invega Sustenna 156 mg on Saturday 11/22 - Continue lithium  750 mg BID for mood stabilization and mania - Li level 0.67 on 05/09/2024  # HTN - Lisinopril 10 mg BID   # Diabetes -Start metformin  500 mg twice daily for elevated hemoglobin A1c - Reviewed labs -hemoglobin A1c 5.9 on 02/2024 - Lipids elevated on 02/2024 - EKG on 05/01/2024: QTc 461  Risk Assessment -low  Discharge Planning Barriers to discharge: psychosis Estimated length of stay: Likely 2 to 3 days Predicted Discharge location: Home     Interval History and update: Chart reviewed, no significant events overnight.  Slept well. Labs were reviewed.  Patient has elevated hemoglobin A1c and lipid panel.  I discussed initiating metformin  with the patient given her elevated hemoglobin A1c, elevated  BMI and cholesterol.  Patient understands and agrees.  We discussed risk, benefits and side effects to include GI distress.  The patient reports previously being on metformin .  Her mental status exam shows improvement particularly in terms of thought process.  She is linear, goal-directed with no flight of ideas.  Speech is a normal rate with  no pressured speech.  Grandiosity still mildly elevated in terms of mood.  Denies SI, HI and AVH.  Patient reports sleeping well and she got at least 6 hours per nurses.  No hyperactivity was noted either.  Patient did not appear to be responding to internal stimuli and no delusional content was made.  The patient was able to inform me of previous episodes of mania, around 5 or 6 in her past.  These have all required inpatient psychiatric hospitalization.  She endorses being diagnosed with bipolar disorder and being stable on oral risperidone  supplemented with Invega long-acting injectable.  The patient is able to inform me that she became manic due to medication noncompliance.  She reports feeling better, like herself again and her thoughts have calm down, she states she can focus better. Appetite stable.  Tolerating her medication regimen well with no observed reported side effects.  We discussed her lithium  as well as lithium  level today.     Review of systems -Denies nausea, vomiting, diarrhea, constipation, abdominal pain, chest pain, shortness of breath, EPSsymptoms   Physical Exam MSK/Neuro - Normal gait and station   Psychiatric Specialty Exam:  Presentation  General Appearance: Appropriate for environment Eye Contact: Good  Speech: Clear and Coherent  Speech Volume: Normal  Handedness: Right   Mood and Affect  Mood: Anxious; Euthymic  Affect: Full Range   Thought Process  Thought Processes: Coherent; Linear  Descriptions of Associations: Intact  Orientation: Full (Time, Place and Person)  Thought Content: Delusions; Paranoid Ideation  History of Schizophrenia/Schizoaffective disorder: Yes  Duration of Psychotic Symptoms: NA Hallucinations: Hallucinations: Other (comment) (denies)  Ideas of Reference: None  Suicidal Thoughts: Suicidal Thoughts: No  Homicidal Thoughts: Homicidal Thoughts: No   Sensorium  Memory: Immediate Good  Judgment: Fair,  improving  Insight: Fair, improving   Executive Functions  Concentration: Fair  Attention Span: Fair  Recall: Good  Fund of Knowledge: Good  Language: Good   Psychomotor Activity  Psychomotor Activity: Psychomotor Activity: Normal   Assets  Assets: Communication Skills; Desire for Improvement   Sleep  Sleep: Good        Lab Results:  Admission on 05/02/2024  Component Date Value Ref Range Status   Lithium  Lvl 05/06/2024 0.56 (L)  0.60 - 1.20 mmol/L Final   Lithium  Lvl 05/09/2024 0.67  0.60 - 1.20 mmol/L Final     Vitals: Blood pressure 134/85, pulse 78, temperature 98.1 F (36.7 C), temperature source Oral, resp. rate 18, height 5' 3 (1.6 m), weight (!) 144.2 kg, SpO2 98%.    Lamar Handler Jama Slain, DO

## 2024-05-09 NOTE — [facility] Group Notes (Signed)
 Adult Psychoeducational Group Note  Date:  05/09/2024 Time:  8:25 PM  Group Topic/Focus:  Wrap-Up Group:   The focus of this group is to help patients review their daily goal of treatment and discuss progress on daily workbooks.  Participation Level:  Active  Participation Quality:  Appropriate  Affect:  Appropriate  Cognitive:  Appropriate  Insight: Appropriate  Engagement in Group:  Engaged  Modes of Intervention:  Education  Additional Comments:  attend NA wrap up group  Lang Drilling Long 05/09/2024, 8:25 PM

## 2024-05-09 NOTE — Group Note (Signed)
 Recreation Therapy Group Note   Group Topic:Coping Skills  Group Date: 05/09/2024 Start Time: 1045 End Time: 1108 Facilitators: Margi Edmundson-McCall, LRT,CTRS Location: [address] Dayroom   Group Topic: Coping Skills   Goal Area(s) Addresses: Patient will define what a coping skill is. Patient will work with peer to create a list of healthy coping skills beginning with each letter of the alphabet. Patient will successfully identify positive coping skills they can use post d/c.  Patient will acknowledge benefit(s) of using learned coping skills post d/c.    Behavioral Response:    Intervention: Group work   Activity: Coping A to Z. Patient asked to identify what a coping skill is and when they use them. Patients with clinical research associate discussed healthy versus unhealthy coping skills. Next patients were given a blank worksheet titled Coping Skills A-Z and asked to pair up with a peer. Partners were instructed to come up with at least one positive coping skill per letter of the alphabet, addressing a specific challenge (ex: stress, anger, anxiety, depression, grief, doubt, isolation, self-harm/suicidal thoughts, substance use). Patients were given 15 minutes to brainstorm with their peer, before ideas were presented to the large group. Patients and LRT debriefed on the importance of coping skill selection based on situation and back-up plans when a skill tried is not effective. At the end of group, patients were given an handout of alphabetized strategies to keep for future reference.   Education: Pharmacologist, Scientist, Physiological, Discharge Planning.    Education Outcome: Acknowledges education/Verbalizes understanding/In group clarification offered/Additional education needed   Affect/Mood: N/A   Participation Level: Did not attend    Clinical Observations/Individualized Feedback:     Plan: Continue to engage patient in RT group sessions 2-3x/week.   Tyrome Donatelli-McCall,  LRT,CTRS 05/09/2024 1:47 PM

## 2024-05-09 NOTE — [facility] Group Notes (Signed)
 Adult Psychoeducational Group Note  Date:  05/09/2024 Time:  11:27 AM  Group Topic/Focus: Recreational Therapy  Participation Level:  Did Not Attend  Participation Quality:    Affect:    Cognitive:    Insight:   Engagement in Group:    Modes of Intervention:    Additional Comments:    Lindsay [name] 05/09/2024, 11:27 AM

## 2024-05-09 NOTE — Progress Notes (Signed)
   05/09/24 0800  Psych Admission Type (Psych Patients Only)  Admission Status Involuntary  Psychosocial Assessment  Patient Complaints None  Eye Contact Fair  Facial Expression Animated  Affect Appropriate to circumstance  Speech Logical/coherent  Interaction Assertive  Motor Activity Slow  Appearance/Hygiene In scrubs  Behavior Characteristics Cooperative;Calm  Mood Pleasant  Thought Process  Coherency Circumstantial  Content Preoccupation  Delusions Paranoid  Perception WDL  Hallucination None reported or observed  Judgment Impaired  Confusion None  Danger to Self  Current suicidal ideation? Denies  Danger to Others  Danger to Others None reported or observed

## 2024-05-09 NOTE — BH IP Treatment Plan (Signed)
 Interdisciplinary Treatment and Diagnostic Plan Update  05/09/2024 Time of Session: THIS IS AN UPDATE Lindsay [name] MRN: [id number]  Principal Diagnosis: Schizoaffective disorder, bipolar type (HCC)  Secondary Diagnoses: Principal Problem:   Schizoaffective disorder, bipolar type (HCC)   Current Medications:  Current Facility-Administered Medications  Medication Dose Route Frequency Provider Last Rate Last Admin   acetaminophen  (TYLENOL ) tablet 650 mg  650 mg Oral Q6H PRN Motley-Mangrum, Jadeka A, PMHNP       alum & mag hydroxide-simeth (MAALOX/MYLANTA) 200-200-20 MG/5ML suspension 30 mL  30 mL Oral Q4H PRN Motley-Mangrum, Jadeka A, PMHNP       cloNIDine  (CATAPRES ) tablet 0.1 mg  0.1 mg Oral Q8H PRN Prentis Kitchens A, DO   0.1 mg at 05/04/24 9381   haloperidol  (HALDOL ) tablet 5 mg  5 mg Oral TID PRN Motley-Mangrum, Jadeka A, PMHNP       And   diphenhydrAMINE  (BENADRYL ) capsule 50 mg  50 mg Oral TID PRN Motley-Mangrum, Jadeka A, PMHNP       haloperidol  lactate (HALDOL ) injection 5 mg  5 mg Intramuscular TID PRN Motley-Mangrum, Jadeka A, PMHNP       And   diphenhydrAMINE  (BENADRYL ) injection 50 mg  50 mg Intramuscular TID PRN Motley-Mangrum, Jadeka A, PMHNP       And   LORazepam  (ATIVAN ) injection 2 mg  2 mg Intramuscular TID PRN Motley-Mangrum, Jadeka A, PMHNP       haloperidol  lactate (HALDOL ) injection 10 mg  10 mg Intramuscular TID PRN Motley-Mangrum, Jadeka A, PMHNP       And   diphenhydrAMINE  (BENADRYL ) injection 50 mg  50 mg Intramuscular TID PRN Motley-Mangrum, Jadeka A, PMHNP       And   LORazepam  (ATIVAN ) injection 2 mg  2 mg Intramuscular TID PRN Motley-Mangrum, Jadeka A, PMHNP       risperiDONE  (RISPERDAL ) tablet 1.5 mg  1.5 mg Oral BID Parker, Alvin S, MD   1.5 mg at 05/09/24 9097   Or   haloperidol  lactate (HALDOL ) injection 2 mg  2 mg Intramuscular BID Parker, Alvin S, MD       lisinopril (ZESTRIL) tablet 10 mg  10 mg Oral BID Ntuen, Tina C, FNP   10 mg at 05/09/24 9097    lithium  carbonate capsule 750 mg  750 mg Oral BID Prentis Kitchens A, DO   750 mg at 05/09/24 0901   magnesium  hydroxide (MILK OF MAGNESIA) suspension 30 mL  30 mL Oral Daily PRN Motley-Mangrum, Jadeka A, PMHNP       melatonin tablet 5 mg  5 mg Oral QHS PRN Motley-Mangrum, Jadeka A, PMHNP   5 mg at 05/08/24 2045   [START ON 05/12/2024] paliperidone (INVEGA SUSTENNA) injection 156 mg  156 mg Intramuscular Once Parker, Alvin S, MD       Vitamin D (Ergocalciferol) (DRISDOL) 1.25 MG (50000 UNIT) capsule 50,000 Units  50,000 Units Oral Weekly Ntuen, Tina C, FNP   50,000 Units at 05/03/24 1801   PTA Medications: Medications Prior to Admission  Medication Sig Dispense Refill Last Dose/Taking   calcium carbonate (TUMS - DOSED IN MG ELEMENTAL CALCIUM) 500 MG chewable tablet Chew 1 tablet by mouth daily as needed for indigestion or heartburn.      lisinopril (ZESTRIL) 10 MG tablet Take 10 mg by mouth 2 (two) times daily.      lithium  300 MG tablet Take 300 mg by mouth 2 (two) times daily. (Patient not taking: Reported on 05/01/2024)      melatonin 5  MG TABS Take 5 mg by mouth at bedtime as needed (sleep).      MOUNJARO 2.5 MG/0.5ML Pen Inject 2.5 mg into the skin once a week. (Patient not taking: Reported on 05/01/2024)      risperiDONE  (RISPERDAL ) 1 MG tablet Take 1 mg by mouth 2 (two) times daily.      Vitamin D, Ergocalciferol, (DRISDOL) 1.25 MG (50000 UNIT) CAPS capsule Take 50,000 Units by mouth every 3 (three) months.       Patient Stressors: Marital or family conflict   Medication change or noncompliance    Patient Strengths: Automotive Engineer for treatment/growth  Supportive family/friends   Treatment Modalities: Medication Management, Group therapy, Case management,  1 to 1 session with clinician, Psychoeducation, Recreational therapy.   Physician Treatment Plan for Primary Diagnosis: Schizoaffective disorder, bipolar type (HCC) Long Term Goal(s): Improvement in  symptoms so as ready for discharge   Short Term Goals: Ability to identify changes in lifestyle to reduce recurrence of condition will improve Ability to verbalize feelings will improve Ability to disclose and discuss suicidal ideas Ability to demonstrate self-control will improve Ability to identify and develop effective coping behaviors will improve Ability to maintain clinical measurements within normal limits will improve Compliance with prescribed medications will improve Ability to identify triggers associated with substance abuse/mental health issues will improve  Medication Management: Evaluate patient's response, side effects, and tolerance of medication regimen.  Therapeutic Interventions: 1 to 1 sessions, Unit Group sessions and Medication administration.  Evaluation of Outcomes: Progressing  Physician Treatment Plan for Secondary Diagnosis: Principal Problem:   Schizoaffective disorder, bipolar type (HCC)  Long Term Goal(s): Improvement in symptoms so as ready for discharge   Short Term Goals: Ability to identify changes in lifestyle to reduce recurrence of condition will improve Ability to verbalize feelings will improve Ability to disclose and discuss suicidal ideas Ability to demonstrate self-control will improve Ability to identify and develop effective coping behaviors will improve Ability to maintain clinical measurements within normal limits will improve Compliance with prescribed medications will improve Ability to identify triggers associated with substance abuse/mental health issues will improve     Medication Management: Evaluate patient's response, side effects, and tolerance of medication regimen.  Therapeutic Interventions: 1 to 1 sessions, Unit Group sessions and Medication administration.  Evaluation of Outcomes: Progressing   RN Treatment Plan for Primary Diagnosis: Schizoaffective disorder, bipolar type (HCC) Long Term Goal(s): Knowledge of disease  and therapeutic regimen to maintain health will improve  Short Term Goals: Ability to demonstrate self-control, Ability to participate in decision making will improve, Ability to identify and develop effective coping behaviors will improve, and Compliance with prescribed medications will improve  Medication Management: RN will administer medications as ordered by provider, will assess and evaluate patient's response and provide education to patient for prescribed medication. RN will report any adverse and/or side effects to prescribing provider.  Therapeutic Interventions: 1 on 1 counseling sessions, Psychoeducation, Medication administration, Evaluate responses to treatment, Monitor vital signs and CBGs as ordered, Perform/monitor CIWA, COWS, AIMS and Fall Risk screenings as ordered, Perform wound care treatments as ordered.  Evaluation of Outcomes: Progressing   LCSW Treatment Plan for Primary Diagnosis: Schizoaffective disorder, bipolar type (HCC) Long Term Goal(s): Safe transition to appropriate next level of care at discharge, Engage patient in therapeutic group addressing interpersonal concerns.  Short Term Goals: Engage patient in aftercare planning with referrals and resources, Increase social support, Increase emotional regulation, and Increase skills for wellness and  recovery  Therapeutic Interventions: Assess for all discharge needs, 1 to 1 time with Child psychotherapist, Explore available resources and support systems, Assess for adequacy in community support network, Educate family and significant other(s) on suicide prevention, Complete Psychosocial Assessment, Interpersonal group therapy.  Evaluation of Outcomes: Progressing   Progress in Treatment: Attending groups: attended some groups Participating in groups:  Yes Taking medication as prescribed: Yes. Toleration medication: Yes. Family/Significant other contact made: Yes, individual(s) contacted:  Lindsay [name] (mother)  [phone] Patient understands diagnosis: Yes. Discussing patient identified problems/goals with staff: No. Medical problems stabilized or resolved: No. Denies suicidal/homicidal ideation: Yes. Issues/concerns per patient self-inventory: No.   New problem(s) identified:  No   New Short Term/Long Term Goal(s):     medication stabilization, elimination of SI thoughts, development of comprehensive mental wellness plan.      Patient Goals:  I want to heal from my diagnosis and the medications they gave me.     Discharge Plan or Barriers:  Patient recently admitted. CSW will continue to follow and assess for appropriate referrals and possible discharge planning.      Reason for Continuation of Hospitalization: Hallucinations Medication stabilization   Estimated Length of Stay:  5 - 7 days  Last 3 [place] Suicide Severity Risk Score: Flowsheet Row [facility] (Current[facility] from [facility]01/2024 in [facility] ED from 03/01/2024 in [facility]  C-SSRS RISK CATEGORY No Risk No Risk Error: Question 6 not populated    Last [facility] 2/9 Scores:     No data to display          Scribe for Treatment Team: Derick JONELLE Blanch, LCSW 05/09/2024 11:53 AM

## 2024-05-09 NOTE — Progress Notes (Signed)
 Conversation with patient:  CSW gave patient employment resources for [employer] [place].   Lindsay [name], LCSWA 05/09/2024

## 2024-05-09 NOTE — BHH Group Notes (Signed)
 Adult Psychoeducational Group Note  Date:  05/09/2024 Time:  11:26 AM  Group Topic/Focus:  Goals Group:   The focus of this group is to help patients establish daily goals to achieve during treatment and discuss how the patient can incorporate goal setting into their daily lives to aide in recovery. Orientation:   The focus of this group is to educate the patient on the purpose and policies of crisis stabilization and provide a format to answer questions about their admission.  The group details unit policies and expectations of patients while admitted.  Participation Level:  Active  Participation Quality:  Appropriate  Affect:  Appropriate  Cognitive:  Appropriate  Insight: Appropriate  Engagement in Group:  Engaged  Modes of Intervention:  Discussion  Additional Comments:  Pt attended the goals group and remained appropriate and engaged throughout the duration of the group.   Gracie Gupta O 05/09/2024, 11:26 AM

## 2024-05-10 NOTE — BHH Group Notes (Signed)
 BHH Group Notes:  (Nursing/MHT/Case Management/Adjunct)  Date:  05/10/2024  Time:  9:21 PM  Type of Therapy:  Wra  Participation Level:  Active  Participation Quality:  Appropriate  Affect:  Appropriate  Cognitive:  Appropriate  Insight:  Appropriate  Engagement in Group:  Engaged  Modes of Intervention:  Education  Summary of Progress/Problems: Goal to interact. Rated day 10/10  Lindsay [name] 05/10/2024, 9:21 PM

## 2024-05-10 NOTE — Group Note (Signed)
 Date:  05/10/2024 Time:  9:55 AM  Group Topic/Focus:  Goals Group:   The focus of this group is to help patients establish daily goals to achieve during treatment and discuss how the patient can incorporate goal setting into their daily lives to aide in recovery.    Participation Level:  Did Not Attend  Participation Quality:  Did Not Attend  Affect:  Did Not Attend  Cognitive:  Did Not Attend  Insight: None  Engagement in Group:  Did Not Attend  Modes of Intervention:  Did Not Attend  Additional Comments:    Avelina DELENA Humphreys 05/10/2024, 9:55 AM

## 2024-05-10 NOTE — Group Note (Signed)
 LCSW Group Therapy Note   Group Date: 05/10/2024 Start Time: 1100 End Time: 1200   Participation:  did not attend  Type of Therapy:  Group Therapy  Topic:  Healing Hearts:  A Safe Space for Grief  Objective:  To create a compassionate group space where participants can process grief, learn about its stages, and explore personal rituals to honor lost loved ones--using supportive elements from CBT, DBT, and group therapy.  Goals:  1. Provide a safe and supportive space where participants feel comfortable sharing their feelings and experiences of grief without judgment.  2. Educate participants about the stages of grief and emphasize that there is no right way to grieve or a fixed timeline for healing.  3. Introduce the concept of rituals to process grief, allowing individuals to honor their loved ones in a personal and meaningful way.  Summary:  In Healing Hearts: A Safe Space for Grief, participants explored the unique and personal nature of grief, with a focus on the five stages (denial, anger, bargaining, depression, acceptance) as a flexible, non-linear process. The group introduced meaningful rituals--like lighting candles or memory walks--as tools for honoring loved ones and managing difficult emotions. Through shared experiences, participants received emotional support, practiced self-care, and reflected on gratitude, emphasizing that healing has no fixed timeline.  Therapeutic Modalities Used: - Cognitive Behavioral Therapy (CBT):   Psychoeducation on grief stages, Challenging unhelpful thoughts (e.g., "I should be over this") -  Dialectical Behavior Therapy (DBT):  Mindfulness (staying present with grief-related emotions), Distress tolerance (using rituals as grounding tools) -  Group Therapy/Supportive Elements:  Emotional validation and peer support, Encouragement of open sharing in a nonjudgmental space   Lindsay [name], LCSWA 05/10/2024  12:26 PM

## 2024-05-10 NOTE — Progress Notes (Signed)
 [facility] Inpatient Psychiatry Progress Note  Date: 05/10/24 Patient: Lindsay [name] MRN: [id number]  Assessment and Plan: Lindsay [name] is a 33 y.o. female with a history of schizoaffective disorder, bipolar type, who was admitted involuntarily for decompensated psychosis.    11/20-mood, affect and behavior are stabilizing.  Patient was moved out of the behavioral health ICU to our standard rooms.  Lithium  level was 0.67 and tolerating her medication regimen well.  No side effects were observed or reported. The patient will be due for her long-acting injectable and I will administer this prior to discharge.     # Bipolar 1 disorder, severe, current or most recent episode manic, with psychotic features (HCC) - continue 1.5 mg BID for mood stabilization and mania  - Initiate Invega Sustenna 234 mg on 11/18, and plan for Invega Sustenna 156 mg on Saturday 11/22 - Continue lithium  750 mg BID for mood stabilization and mania - Li level 0.67 on 05/09/2024  # HTN - Lisinopril 10 mg BID   # Diabetes - Start metformin  500 mg twice daily for elevated hemoglobin A1c - Reviewed labs -hemoglobin A1c 5.9 on 02/2024 - Lipids elevated on 02/2024 - EKG on 05/01/2024: QTc 461  Risk Assessment -low  Discharge Planning Barriers to discharge: psychosis Estimated length of stay: Likely 2 to 3 days Predicted Discharge location: Home     Interval History and update:   Chart reviewed Vital signs stable Lithium  level returned at 0.67 No events overnight Nursing reports patient slept 7.5 hours  On my assessment she is calm, friendly and interacts well with me.  No pressured speech or flight of ideas.  No hyperactivity or grandiosity.  She reports her mood is good and affect is bright.  Patient has interacting well with staff and going to groups.  She states that she feels that her mind is slowing down and she is beginning to return to normal.  She informs me that she  will return back to her apartment where she has her younger son.  She also informed me that her cousin her mother could come stay with her after discharge.  I requested that she contact her mother in order to establish whether she is at baseline or not.  Patient gave me permission to call her mother to discuss her case.  She informing that she was able to read and drawl with no distractions.  Denies SI, HI and AVH.  Patient's mother is Santana Cassis [phone]     Review of systems -Denies nausea, vomiting, diarrhea, constipation, abdominal pain, chest pain, shortness of breath, EPS symptoms   Physical Exam MSK/Neuro - Normal gait and station     Psychiatric Specialty Exam:  Presentation  General Appearance: Appropriate for environment Eye Contact: Good  Speech: Clear and Coherent  Speech Volume: Normal    Mood and Affect  Mood: Euthymic  Affect: Full Range   Thought Process  Thought Processes: Coherent; Linear  Descriptions of Associations: Intact  Orientation: Full (Time, Place and Person)  Thought Content: No delusions or grandiosity elicited  History of Schizophrenia/Schizoaffective disorder: Yes  Duration of Psychotic Symptoms: NA Hallucinations: Denies, not observed  Ideas of Reference: None  Suicidal Thoughts: Denies  Homicidal Thoughts:    Sensorium  Memory: Immediate Good  Judgment: Good, improving  Insight: Good improving   Executive Functions  Concentration: Good  Attention Span: Good  Recall: Good  Fund of Knowledge: Good  Language: Good   Psychomotor Activity  Psychomotor Activity: Normal  Assets  Assets: Manufacturing Systems Engineer; Desire for Improvement   Sleep  Sleep: Good, 7.5 hours        Lab Results:  Admission on 05/02/2024  Component Date Value Ref Range Status   Lithium  Lvl 05/06/2024 0.56 (L)  0.60 - 1.20 mmol/L Final   Lithium  Lvl 05/09/2024 0.67  0.60 - 1.20 mmol/L Final     Vitals: Blood pressure  128/77, pulse 91, temperature 98.1 F (36.7 C), temperature source Oral, resp. rate 18, height 5' 3 (1.6 m), weight (!) 144.2 kg, SpO2 98%.    Lamar Handler Jama Slain, DO

## 2024-05-10 NOTE — BHH Group Notes (Signed)
 Patient did not attend the Social Work group.

## 2024-05-10 NOTE — Group Note (Signed)
 Recreation Therapy Group Note   Group Topic:Other  Group Date: 05/10/2024 Start Time: 1308 End Time: 1352 Facilitators: Pascha Fogal-McCall, LRT,CTRS Location: [address] Dayroom   Activity Description/Intervention: Therapeutic Drumming. Patients with peers and staff were given the opportunity to engage in a leader facilitated [facility] Group Empowerment Drumming Circle with staff from the [employer], in partnership with [employer]. Teaching laboratory technician and trained [place], Norleen Mon leading with LRT observing and documenting intervention and pt response. This evidenced-based practice targets 7 areas of health and wellbeing in the human experience including: stress-reduction, exercise, self-expression, camaraderie/support, nurturing, spirituality, and music-making (leisure).   Goal Area(s) Addresses:  Patient will engage in pro-social way in music group.  Patient will follow directions of drum leader on the first prompt. Patient will demonstrate no behavioral issues during group.  Patient will identify if a reduction in stress level occurs as a result of participation in therapeutic drum circle.    Education: Leisure exposure, Pharmacologist, Musical expression, Discharge Planning   Affect/Mood: Appropriate   Participation Level: Engaged   Participation Quality: Independent   Behavior: Appropriate   Speech/Thought Process: Focused   Insight: Good   Judgement: Good   Modes of Intervention: Teaching Laboratory Technician   Patient Response to Interventions:  Engaged   Education Outcome:  In group clarification offered    Clinical Observations/Individualized Feedback: Niema actively engaged in therapeutic drumming exercise and discussions. Pt was appropriate with peers, staff, and musical equipment for duration of programming.  Pt identified grateful as their feeling after participation in music-based programming. Pt affect congruent with verbalized  emotion.    Plan: Continue to engage patient in RT group sessions 2-3x/week.   Jaxtyn Linville-McCall, LRT,CTRS 05/10/2024 2:24 PM

## 2024-05-10 NOTE — Progress Notes (Signed)
(  Sleep Hours) -7.5 (Any PRNs that were needed, meds refused, or side effects to meds)- [place] (Any disturbances and when (visitation, over night)-none (Concerns raised by the patient)- none (SI/HI/AVH)-denied

## 2024-05-10 NOTE — BHH Group Notes (Signed)
 Patient attended the Music Therapy group.

## 2024-05-10 NOTE — Plan of Care (Signed)
   Problem: Education: Goal: Knowledge of [place] [facility] Education information/materials will improve Outcome: Progressing Goal: Emotional status will improve Outcome: Progressing

## 2024-05-10 NOTE — Progress Notes (Signed)
   05/10/24 1400  Psych Admission Type (Psych Patients Only)  Admission Status Involuntary  Psychosocial Assessment  Patient Complaints None  Eye Contact Fair  Facial Expression Animated  Affect Appropriate to circumstance  Speech Logical/coherent  Interaction Assertive  Motor Activity Slow  Appearance/Hygiene In scrubs  Behavior Characteristics Cooperative;Calm  Mood Pleasant  Thought Process  Coherency Circumstantial  Content Preoccupation  Delusions Paranoid  Perception WDL  Hallucination None reported or observed  Judgment Impaired  Confusion None  Danger to Self  Current suicidal ideation? Denies  Danger to Others  Danger to Others None reported or observed

## 2024-05-10 NOTE — Group Note (Signed)
 Occupational Therapy Group Note  Group Topic:Coping Skills  Group Date: 05/10/2024 Start Time: 1500 End Time: 1535 Facilitators: Dot [place] MATSU, OT   Group Description: Group encouraged increased engagement and participation through discussion and activity focused on Coping Ahead. Patients were split up into teams and selected a card from a stack of positive coping strategies. Patients were instructed to act out/charade the coping skill for other peers to guess and receive points for their team. Discussion followed with a focus on identifying additional positive coping strategies and patients shared how they were going to cope ahead over the weekend while continuing hospitalization stay.  Therapeutic Goal(s): Identify positive vs negative coping strategies. Identify coping skills to be used during hospitalization vs coping skills outside of hospital/at home Increase participation in therapeutic group environment and promote engagement in treatment   Participation Level: Engaged   Participation Quality: Independent   Behavior: Appropriate   Speech/Thought Process: Relevant   Affect/Mood: Appropriate   Insight: Fair   Judgement: Fair      Modes of Intervention: Education  Patient Response to Interventions:  Attentive   Plan: Continue to engage patient in OT groups 2 - 3x/week.  05/10/2024  [place] MATSU Dot, OT  Lindsay [name], OT

## 2024-05-10 NOTE — BHH Group Notes (Signed)
 Patient attended the Occupational Therapy group.

## 2024-05-10 NOTE — Plan of Care (Signed)
  Problem: Education: Goal: Knowledge of [place] [facility] Education information/materials will improve Outcome: Not Progressing Goal: Emotional status will improve Outcome: Not Progressing   

## 2024-05-11 ENCOUNTER — Other Ambulatory Visit (HOSPITAL_COMMUNITY): Payer: Self-pay

## 2024-05-11 MED ORDER — PALIPERIDONE PALMITATE ER 156 MG/ML IM SUSY: 156.0000 mg | PREFILLED_SYRINGE | Freq: Once | INTRAMUSCULAR | Status: DC

## 2024-05-11 MED ORDER — PALIPERIDONE PALMITATE ER 156 MG/ML IM SUSY
156.0000 mg | PREFILLED_SYRINGE | Freq: Once | INTRAMUSCULAR | Status: AC
  Administered 2024-05-11: 156 mg via INTRAMUSCULAR
  Filled 2024-05-11: qty 1

## 2024-05-11 NOTE — BHH Group Notes (Signed)
 BHH Group Notes:  (Nursing/MHT/Case Management/Adjunct)  Date:  05/11/2024  Time:  8:16 PM  Type of Therapy:  AA Group  Participation Level:  Did Not Attend  Participation Quality:    Affect:    Cognitive:    Insight:    Engagement in Group:    Modes of Intervention:    Summary of Progress/Problems:  Lindsay [name] 05/11/2024, 8:16 PM

## 2024-05-11 NOTE — Group Note (Signed)
 Date:  05/11/2024 Time:  3:39 PM  Group Topic/Focus: Sleep Hygiene Dimensions of Wellness:   The focus of this group is to introduce the topic of wellness and discuss the role each dimension of wellness plays in total health.    Participation Level:  Did Not Attend  Lindsay [name] 05/11/2024, 3:39 PM

## 2024-05-11 NOTE — BHH Group Notes (Signed)
 Adult Psychoeducational Group Note  Date:  05/11/2024 Time:  10:45 AM  Group Topic/Focus: Goals Group Goals Group:   The focus of this group is to help patients establish daily goals to achieve during treatment and discuss how the patient can incorporate goal setting into their daily lives to aide in recovery.  Participation Level:  Active  Participation Quality:  Appropriate  Affect:  Appropriate  Cognitive:  Appropriate  Insight: Appropriate  Engagement in Group:  Engaged  Modes of Intervention:  Discussion  Additional Comments:  The patient engaged  in group discussion.  Mayla Biddy Lee 05/11/2024, 10:45 AM

## 2024-05-11 NOTE — Progress Notes (Signed)
 [facility] Inpatient Psychiatry Progress Note  Date: 05/11/24 Patient: Lindsay [name] MRN: [id number]  Assessment and Plan: AMAYA BLAKEMAN is a 33 y.o. female with a history of schizoaffective disorder, bipolar type, who was admitted involuntarily for decompensated psychosis.    11/21 Stable in terms of mood, affect and behavior.  Thought process and thought content are within normal limits.  Patient is due for Invega  Sustenna 156 mg long-acting injectable tomorrow.  However, plan is to discharge tomorrow and will administer today in order to observe for side effects.  Lithium  level is within therapeutic limits.  Overall patient has stabilized in terms of her mood and behavior and no longer has symptoms of mania.  We will plan to discharge tomorrow pending any major changes.    # Bipolar 1 disorder, severe, current or most recent episode manic, with psychotic features (HCC) - continue 1.5 mg BID for mood stabilization and mania  - Administer today Invega  Sustenna 156 mg on 11/21, and previous Invega  Sustenna 234 on 11/18 - Continue lithium  750 mg BID for mood stabilization and mania - Li level 0.67 on 05/09/2024  # HTN - Lisinopril  10 mg BID   # Diabetes - Start metformin  500 mg twice daily for elevated hemoglobin A1c - Reviewed labs -hemoglobin A1c 5.9 on 02/2024 - Lipids elevated on 02/2024 - EKG on 05/01/2024: QTc 461  Risk Assessment -low  Discharge Planning Barriers to discharge: psychosis Estimated length of stay: 04/2021 Predicted Discharge location: Home     Interval History and update:   Chart reviewed Vital signs stable Lithium  level returned at 0.67 No events overnight Nursing reports patient slept 7. 25  On my assessment today, the patient reports her mood as good and states that she feels normal.  The patient denies symptoms of flight of ideas, racing thoughts.  She reports sleeping well over 7 hours last night.  Denies SI, HI and  AVH.  Sleep and appetite are both stable as well.  Denying any side effects from her medications and denies any other review of systems.  The patient is pleased with having no sedation or EPS.  The patient states that she called her mother and her mother informed her that she feels that she is back to her normal state.  No flight of ideas, pressured speech, hyperactivity or grandiosity was noted on my exam today.  Given these factors both the patient and myself feel she is ready for discharge tomorrow.  I informed her that she is due for her Invega  long-acting injectable tomorrow though given the fact she is discharging I would like to administer it today in order to observe for side effects.  The patient agrees to this plan.  Patient's mother is Santana Cassis [phone] - We discussed the patient's course of treatment to include medications, current lithium  level and plan to administer final long-acting injectable today.  I also discussed follow-up with her ACT team as well as considering aligning her with another outpatient program in order to ensure backup psychiatric care if she is unable to be seen by the ACT team or if the mother has concerns about their management.  I recommended she work with her daughter on a healthcare power of attorney in case the patient begins to decompensate and the mother can advocate on her behalf for outpatient care before she needs to go to an inpatient psychiatric facility. - The mother informed me that she feels that her daughter is ready for discharge as she  seems to have good insight now, is not pressured in her speech nor demonstrating other symptoms of mania that she has seen in the past.    Review of systems -Denies nausea, vomiting, diarrhea, constipation, abdominal pain, chest pain, shortness of breath, EPS symptoms   Physical Exam MSK/Neuro - Normal gait and station     Psychiatric Specialty Exam:  Presentation  General Appearance: Appropriate for  environment Eye Contact: Good  Speech: Clear and Coherent  Speech Volume: Normal    Mood and Affect  Mood: Euthymic  Affect: Full Range   Thought Process  Thought Processes: Coherent; Linear  Descriptions of Associations: Intact  Orientation: Full (Time, Place and Person)  Thought Content: No delusions or grandiosity elicited  History of Schizophrenia/Schizoaffective disorder: Yes  Duration of Psychotic Symptoms: NA Hallucinations: Denies, not observed  Ideas of Reference: None  Suicidal Thoughts: Denies  Homicidal Thoughts:    Sensorium  Memory: Immediate Good  Judgment: Good, improved  Insight: Good improved   Executive Functions  Concentration: Good  Attention Span: Good  Recall: Good  Fund of Knowledge: Good  Language: Good   Psychomotor Activity  Psychomotor Activity: Normal   Assets  Assets: Communication Skills; Desire for Improvement   Sleep  Sleep: Good, 7.25 hours        Lab Results:  Admission on 05/02/2024  Component Date Value Ref Range Status   Lithium  Lvl 05/06/2024 0.56 (L)  0.60 - 1.20 mmol/L Final   Lithium  Lvl 05/09/2024 0.67  0.60 - 1.20 mmol/L Final     Vitals: Blood pressure 130/82, pulse 86, temperature 98.6 F (37 C), resp. rate 18, height 5' 3 (1.6 m), weight (!) 144.2 kg, SpO2 98%.    Lamar Handler Jama Slain, DO

## 2024-05-11 NOTE — Progress Notes (Signed)
 D: Patient is alert, oriented, pleasant, and cooperative. Denies SI, HI, AVH, and verbally contracts for safety. Patient reports she slept good last night with sleeping medication. Patient reports her appetite as good, energy level as normal, and concentration as good. Patient rates her depression 0/10, hopelessness 0/10, and anxiety 0/10. Patient denies physical symptoms/pain.    A: Scheduled medications administered per MD order. LAI administered today. Support provided. Patient educated on safety on the unit and medications. Routine safety checks every 15 minutes. Patient stated understanding to tell nurse about any new physical symptoms. Patient understands to tell staff of any needs.     R: No adverse drug reactions noted. Patient remains safe at this time and will continue to monitor.    05/11/24 0900  Psych Admission Type (Psych Patients Only)  Admission Status Involuntary  Psychosocial Assessment  Patient Complaints None  Eye Contact Fair  Facial Expression Animated  Affect Appropriate to circumstance  Speech Logical/coherent  Interaction Assertive  Motor Activity Slow  Appearance/Hygiene In scrubs  Behavior Characteristics Cooperative  Mood Pleasant  Thought Process  Coherency Circumstantial  Content Preoccupation  Delusions Paranoid  Perception WDL  Hallucination None reported or observed  Judgment Impaired  Confusion None  Danger to Self  Current suicidal ideation? Denies  Danger to Others  Danger to Others None reported or observed

## 2024-05-11 NOTE — Progress Notes (Signed)
   05/11/24 2254  Psych Admission Type (Psych Patients Only)  Admission Status Involuntary  Psychosocial Assessment  Patient Complaints None  Eye Contact Fair  Facial Expression Animated  Affect Appropriate to circumstance  Speech Logical/coherent  Interaction Assertive  Motor Activity Slow  Appearance/Hygiene In scrubs  Behavior Characteristics Cooperative  Mood Pleasant  Thought Process  Coherency Circumstantial  Content Preoccupation  Delusions Paranoid  Perception WDL  Hallucination None reported or observed  Judgment Impaired  Confusion None  Danger to Self  Current suicidal ideation? Denies  Danger to Others  Danger to Others None reported or observed

## 2024-05-11 NOTE — BHH Group Notes (Signed)
 Adult Psychoeducational Group Note  Date:  05/11/2024 Time:  12:02 PM  Group Topic/Focus: Recreational Therapy   Participation Level:  Attended  Lindsay [name] 05/11/2024, 12:02 PM

## 2024-05-11 NOTE — Group Note (Signed)
 Recreation Therapy Group Note   Group Topic:Problem Solving  Group Date: 05/11/2024 Start Time: 1005 End Time: 1030 Facilitators: Abrea Henle-McCall, LRT,CTRS Location: [address] Dayroom   Group Topic: Communication, Team Building, Problem Solving  Goal Area(s) Addresses:  Patient will effectively work with peer towards shared goal.  Patient will identify skills used to make activity successful.  Patient will identify how skills used during activity can be applied to reach post d/c goals.   Behavioral Response: Active  Intervention: STEM Activity- Glass Blower/designer  Activity: Tallest [employer]. In teams of 5-6, patients were given 11 craft pipe cleaners. Using the materials provided, patients were instructed to compete again the opposing team(s) to build the tallest free-standing structure from floor level. The activity was timed; difficulty increased by clinical research associate as production designer, theatre/television/film continued.  Systematically resources were removed with additional directions for example, placing one arm behind their back, working in silence, and shape stipulations. LRT facilitated post-activity discussion reviewing team processes and necessary communication skills involved in completion. Patients were encouraged to reflect how the skills utilized, or not utilized, in this activity can be incorporated to positively impact support systems post discharge.  Education: Pharmacist, Community, Scientist, Physiological, Discharge Planning   Education Outcome: Acknowledges education/In group clarification offered/Needs additional education.    Affect/Mood: Appropriate   Participation Level: Active   Participation Quality: Independent   Behavior: Appropriate   Speech/Thought Process: Focused   Insight: Moderate   Judgement: Moderate   Modes of Intervention: STEM Activity   Patient Response to Interventions:  Engaged   Education Outcome:  In group clarification offered    Clinical  Observations/Individualized Feedback: Pt was bright and active during activity. Pt was attentive to peer and followed his lead.     Plan: Continue to engage patient in RT group sessions 2-3x/week.   Buell Parcel-McCall, LRT,CTRS 05/11/2024 12:02 PM

## 2024-05-11 NOTE — Plan of Care (Signed)
   Problem: Education: Goal: Knowledge of [facility] Education information/materials will improve Outcome: Progressing Goal: Emotional status will improve Outcome: Progressing Goal: Mental status will improve Outcome: Progressing Goal: Verbalization of understanding the information provided will improve Outcome: Progressing

## 2024-05-11 NOTE — Progress Notes (Signed)
  [facility] Adult Case Management Discharge Plan :  Will you be returning to the same living situation after discharge:  Yes,  patient will be returning to her home, address on file.  At discharge, do you have transportation home?: No. CSW arranged taxi voucher through [employer] at 9:30 am. Per provider, Dr.Olson, and patient's cousin, Erminio Cook, they have come to the conclusion that the patient is safe and stable enough to receive transportation through taxi to return home. CSW also confirmed patient has access to her home; a key ring with 5 keys was identified in her belongings, patient confirmed her house key is located on this key ring.  Do you have the ability to pay for your medications: Yes,  patient has active health insurance.  Release of information consent forms completed and in the chart;  Patient's signature needed at discharge.  Patient to Follow up at:  Follow-up Information     Services, [facility] Follow up on 05/16/2024.   Why: Please continue with this provider for ACTT services for therapy and medication management upon discharge on 05/16/24 at 9:00 am. Contact information: [address] [place] [address] [phone]                 Next level of care provider has access to [facility] Link:no  Safety Planning and Suicide Prevention discussed: Yes,  completed with Erminio Cook (cousin) [phone].      Has patient been referred to the Quitline?: Patient does not use tobacco/nicotine  products  Patient has been referred for addiction treatment: No known substance use disorder.  Louetta Lame, LCSWA 05/11/2024, 4:22 PM

## 2024-05-11 NOTE — BHH Group Notes (Signed)
 Patient did not attend the Social Wellness group.

## 2024-05-11 NOTE — Progress Notes (Signed)
(  Sleep Hours) -7.25 (Any PRNs that were needed, meds refused, or side effects to meds)-melatonin and clonidine  (Any disturbances and when (visitation, over night)-none (Concerns raised by the patient)- none (SI/HI/AVH)-denied

## 2024-05-12 DIAGNOSIS — F312 Bipolar disorder, current episode manic severe with psychotic features: Principal | ICD-10-CM

## 2024-05-12 MED ORDER — RISPERIDONE 0.5 MG PO TABS: 1.5000 mg | ORAL_TABLET | Freq: Two times a day (BID) | ORAL | 0 refills | Status: AC

## 2024-05-12 MED ORDER — LISINOPRIL 10 MG PO TABS: 10.0000 mg | ORAL_TABLET | Freq: Every day | ORAL | 0 refills | Status: AC

## 2024-05-12 MED ORDER — METFORMIN HCL 500 MG PO TABS: 500.0000 mg | ORAL_TABLET | Freq: Two times a day (BID) | ORAL | 0 refills | Status: AC

## 2024-05-12 MED ORDER — LITHIUM CARBONATE 150 MG PO CAPS
750.0000 mg | ORAL_CAPSULE | Freq: Two times a day (BID) | ORAL | 0 refills | Status: AC
Start: 2024-05-12 — End: ?

## 2024-05-12 NOTE — BHH Counselor (Signed)
 05/12/2024  [name] DOB: [birth year] MRN: [id number]   RIDER WAIVER AND RELEASE OF LIABILITY  For the purposes of helping with transportation needs, [facility] partners with outside transportation providers (taxi companies, [place], catering manager.) to give [place] patients or other approved people the choice of on-demand rides Public Librarian) to our buildings for non-emergency visits.  By using [employer], I, the person signing this document, on behalf of myself and/or any legal minors (in my care using the [employer]), agree:  Science Writer given to me are supplied by independent, outside transportation providers who do not work for, or have any affiliation with, [employer]. [place] is not a transportation company. [place] has no control over the quality or safety of the rides I get using [employer]. [place] has no control over whether any outside ride will happen on time or not. [place] gives no guarantee on the reliability, quality, safety, or availability on any rides, or that no mistakes will happen. I know and accept that traveling by vehicle (car, truck, SVU, fleeta, bus, taxi, etc.) has risks of serious injuries such as disability, being paralyzed, and death. I know and agree the risk of using [employer] is mine alone, and not [employer]. [employer] are provided as is and as are available. The transportation providers are in charge for all inspections and care of the vehicles used to provide these rides. I agree not to take legal action against [facility], its agents, employees, officers, directors, representatives, insurers, attorneys, assigns, successors, subsidiaries, and affiliates at any time for any reasons related directly or indirectly to using [employer]. I also agree not to take legal action against [facility] or its affiliates for any injury, death, or damage to property caused by or related to using  [employer]. I have read this Waiver and Release of Liability, and I understand the terms used in it and their legal meaning. This Waiver is freely and voluntarily given with the understanding that my right (or any legal minors) to legal action against  relating to [employer] is knowingly given up to use these services.  Original waiver singed by patient and faxed to medical records   I attest that I read the Ride Waiver and Release of Liability to [name], gave Ms. Ada the opportunity to ask questions and answered the questions asked (if any). I affirm that [name] then provided consent for assistance with transportation.

## 2024-05-12 NOTE — Progress Notes (Signed)
(  Sleep Hours) -7.75 (Any PRNs that were needed, meds refused, or side effects to meds)- none (Any disturbances and when (visitation, over night)-none (Concerns raised by the patient)- none (SI/HI/AVH)-denies all

## 2024-05-12 NOTE — Group Note (Signed)
 Date:  05/12/2024 Time:  10:16 AM  Group Topic/Focus: Goals Group  Goals Group focused on self-reflection and future planning. The group began with an icebreaker in which members shared their favorite foods and identified personal strengths using a provided list of positive traits. Patients then discussed their short- and long-term goals, including 1-week, 66-month, 1-year, and 5-year objectives. Participants identified potential obstacles to achieving these goals and explored practical steps they could take if such barriers arise.    Participation Level:  None  Participation Quality:  Patient was with a provider during most of goals group.  Affect:  Patient was with a provider during most of goals group.  Cognitive:  Patient was with a provider during most of goals group.  Insight: Limited  Engagement in Group:  Limited  Modes of Intervention:  Discussion, Exploration, and Socialization  Additional Comments:  Patient was with a provider during most of goals group.  Kristi HERO Talana Slatten 05/12/2024, 10:16 AM
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
## 2024-05-12 NOTE — Progress Notes (Signed)
 Upon discharging, Nurse went over AVS educating on medications and follow up appointments. Suicide safety plan was completed. Reviewed with nurse and copy given to patient and placed in chart. Belongings sheet was signed and items returned to patient. Patient denies SI/HI (with no plan) and AVH. Voices no concerns to staff prior discharging off unit. Was safely walked out ride.

## 2024-05-12 NOTE — Progress Notes (Signed)
  [facility] Adult Case Management Discharge Plan :  Will you be returning to the same living situation after discharge:  Yes,  resides in own home with son At discharge, do you have transportation home?: No. Do you have the ability to pay for your medications: Yes,  Patient  has insurance  Release of information consent forms completed and in the chart;  Patient's signature needed at discharge.  Patient to Follow up at:  Follow-up Information     [facility] Follow up on 05/16/2024.   Why: Please continue with this provider for ACTT services for therapy and medication management upon discharge on 05/16/24 at 9:00 am. Contact information: [address] [place] [address] [phone]                 Next level of care provider has access to [facility] Link:no  Safety Planning and Suicide Prevention discussed: Yes,  completed on 05/03/24 with mother Raymund Cassis    Has patient been referred to the Quitline?: Patient does not use tobacco/nicotine  products  Patient has been referred for addiction treatment: No known substance use disorder.  Ermalinda Penton [place], [place] 05/12/2024, 8:49 AM
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
## 2024-05-12 NOTE — Plan of Care (Signed)
   Problem: Education: Goal: Emotional status will improve Outcome: Progressing Goal: Mental status will improve Outcome: Progressing

## 2024-05-16 NOTE — Progress Notes (Signed)

## 2024-11-29 ENCOUNTER — Ambulatory Visit (HOSPITAL_COMMUNITY): Payer: MEDICAID | Admitting: Registered Nurse
